# Patient Record
Sex: Female | Born: 1960
Health system: Southern US, Community
[De-identification: ages and names within clinical notes are randomized; demographics above are authoritative.]

## PROBLEM LIST (undated history)

## (undated) ENCOUNTER — Emergency Department (HOSPITAL_COMMUNITY): Admission: EM | Payer: 59 | Source: Home / Self Care

## (undated) DIAGNOSIS — E669 Obesity, unspecified: Secondary | ICD-10-CM

## (undated) DIAGNOSIS — G43909 Migraine, unspecified, not intractable, without status migrainosus: Secondary | ICD-10-CM

## (undated) DIAGNOSIS — M7730 Calcaneal spur, unspecified foot: Secondary | ICD-10-CM

## (undated) DIAGNOSIS — C50919 Malignant neoplasm of unspecified site of unspecified female breast: Secondary | ICD-10-CM

## (undated) DIAGNOSIS — E785 Hyperlipidemia, unspecified: Secondary | ICD-10-CM

## (undated) DIAGNOSIS — I10 Essential (primary) hypertension: Secondary | ICD-10-CM

## (undated) HISTORY — PX: TUBAL LIGATION: SHX77

## (undated) HISTORY — PX: OTHER SURGICAL HISTORY: SHX169

## (undated) HISTORY — PX: CHOLECYSTECTOMY: SHX55

## (undated) HISTORY — DX: Calcaneal spur, unspecified foot: M77.30

## (undated) HISTORY — DX: Obesity, unspecified: E66.9

## (undated) HISTORY — DX: Malignant neoplasm of unspecified site of unspecified female breast: C50.919

## (undated) HISTORY — DX: Essential (primary) hypertension: I10

## (undated) HISTORY — PX: BREAST SURGERY: SHX581

## (undated) HISTORY — DX: Migraine, unspecified, not intractable, without status migrainosus: G43.909

## (undated) HISTORY — DX: Hyperlipidemia, unspecified: E78.5

---

## 1990-09-25 HISTORY — PX: OTHER SURGICAL HISTORY: SHX169

## 1998-09-25 HISTORY — PX: OTHER SURGICAL HISTORY: SHX169

## 2003-05-08 ENCOUNTER — Encounter: Payer: Self-pay | Admitting: Family Medicine

## 2003-05-08 ENCOUNTER — Ambulatory Visit (HOSPITAL_COMMUNITY): Admission: RE | Admit: 2003-05-08 | Discharge: 2003-05-08 | Payer: Self-pay | Admitting: Family Medicine

## 2003-05-20 ENCOUNTER — Encounter: Payer: Self-pay | Admitting: Family Medicine

## 2003-05-20 ENCOUNTER — Ambulatory Visit (HOSPITAL_COMMUNITY): Admission: RE | Admit: 2003-05-20 | Discharge: 2003-05-20 | Payer: Self-pay | Admitting: Family Medicine

## 2004-09-13 ENCOUNTER — Ambulatory Visit: Payer: Self-pay | Admitting: Family Medicine

## 2004-09-20 ENCOUNTER — Ambulatory Visit (HOSPITAL_COMMUNITY): Admission: RE | Admit: 2004-09-20 | Discharge: 2004-09-20 | Payer: Self-pay | Admitting: Family Medicine

## 2004-10-05 ENCOUNTER — Ambulatory Visit (HOSPITAL_COMMUNITY): Admission: RE | Admit: 2004-10-05 | Discharge: 2004-10-05 | Payer: Self-pay | Admitting: Family Medicine

## 2004-10-18 ENCOUNTER — Inpatient Hospital Stay (HOSPITAL_COMMUNITY): Admission: RE | Admit: 2004-10-18 | Discharge: 2004-10-20 | Payer: Self-pay | Admitting: General Surgery

## 2004-11-11 ENCOUNTER — Emergency Department (HOSPITAL_COMMUNITY): Admission: EM | Admit: 2004-11-11 | Discharge: 2004-11-11 | Payer: Self-pay | Admitting: Emergency Medicine

## 2004-11-11 ENCOUNTER — Ambulatory Visit: Payer: Self-pay | Admitting: Family Medicine

## 2004-11-15 ENCOUNTER — Encounter: Admission: RE | Admit: 2004-11-15 | Discharge: 2004-11-15 | Payer: Self-pay | Admitting: Oncology

## 2004-11-15 ENCOUNTER — Ambulatory Visit (HOSPITAL_COMMUNITY): Payer: Self-pay | Admitting: Oncology

## 2004-11-15 ENCOUNTER — Encounter (HOSPITAL_COMMUNITY): Admission: RE | Admit: 2004-11-15 | Discharge: 2004-12-15 | Payer: Self-pay | Admitting: Oncology

## 2004-11-17 ENCOUNTER — Ambulatory Visit: Payer: Self-pay | Admitting: Cardiology

## 2004-11-18 ENCOUNTER — Ambulatory Visit (HOSPITAL_COMMUNITY): Admission: RE | Admit: 2004-11-18 | Discharge: 2004-11-18 | Payer: Self-pay | Admitting: Oncology

## 2004-11-25 ENCOUNTER — Ambulatory Visit (HOSPITAL_COMMUNITY): Admission: RE | Admit: 2004-11-25 | Discharge: 2004-11-25 | Payer: Self-pay | Admitting: General Surgery

## 2004-12-19 ENCOUNTER — Encounter (HOSPITAL_COMMUNITY): Admission: RE | Admit: 2004-12-19 | Discharge: 2005-01-18 | Payer: Self-pay | Admitting: Oncology

## 2004-12-19 ENCOUNTER — Encounter: Admission: RE | Admit: 2004-12-19 | Discharge: 2004-12-19 | Payer: Self-pay | Admitting: Oncology

## 2004-12-28 ENCOUNTER — Ambulatory Visit: Payer: Self-pay | Admitting: Family Medicine

## 2005-01-02 ENCOUNTER — Ambulatory Visit (HOSPITAL_COMMUNITY): Payer: Self-pay | Admitting: Oncology

## 2005-01-04 ENCOUNTER — Ambulatory Visit (HOSPITAL_COMMUNITY): Admission: RE | Admit: 2005-01-04 | Discharge: 2005-01-04 | Payer: Self-pay | Admitting: Family Medicine

## 2005-01-11 ENCOUNTER — Ambulatory Visit: Payer: Self-pay | Admitting: *Deleted

## 2005-01-13 ENCOUNTER — Encounter (HOSPITAL_COMMUNITY): Admission: RE | Admit: 2005-01-13 | Discharge: 2005-02-12 | Payer: Self-pay | Admitting: Oncology

## 2005-01-13 ENCOUNTER — Encounter: Admission: RE | Admit: 2005-01-13 | Discharge: 2005-01-13 | Payer: Self-pay | Admitting: Oncology

## 2005-02-13 ENCOUNTER — Encounter (HOSPITAL_COMMUNITY): Admission: RE | Admit: 2005-02-13 | Discharge: 2005-03-15 | Payer: Self-pay | Admitting: Oncology

## 2005-02-13 ENCOUNTER — Encounter: Admission: RE | Admit: 2005-02-13 | Discharge: 2005-02-13 | Payer: Self-pay | Admitting: Oncology

## 2005-02-20 ENCOUNTER — Ambulatory Visit (HOSPITAL_COMMUNITY): Payer: Self-pay | Admitting: Oncology

## 2005-03-20 ENCOUNTER — Encounter (HOSPITAL_COMMUNITY): Admission: RE | Admit: 2005-03-20 | Discharge: 2005-04-19 | Payer: Self-pay | Admitting: Oncology

## 2005-03-20 ENCOUNTER — Encounter: Admission: RE | Admit: 2005-03-20 | Discharge: 2005-03-20 | Payer: Self-pay | Admitting: Oncology

## 2005-04-10 ENCOUNTER — Ambulatory Visit (HOSPITAL_COMMUNITY): Payer: Self-pay | Admitting: Oncology

## 2005-04-24 ENCOUNTER — Encounter (HOSPITAL_COMMUNITY): Admission: RE | Admit: 2005-04-24 | Discharge: 2005-05-24 | Payer: Self-pay | Admitting: Oncology

## 2005-04-24 ENCOUNTER — Encounter: Admission: RE | Admit: 2005-04-24 | Discharge: 2005-04-24 | Payer: Self-pay | Admitting: Oncology

## 2005-05-24 ENCOUNTER — Ambulatory Visit (HOSPITAL_COMMUNITY): Admission: RE | Admit: 2005-05-24 | Discharge: 2005-05-24 | Payer: Self-pay | Admitting: Radiation Oncology

## 2005-05-25 ENCOUNTER — Ambulatory Visit: Admission: RE | Admit: 2005-05-25 | Discharge: 2005-08-14 | Payer: Self-pay | Admitting: Radiation Oncology

## 2005-05-30 ENCOUNTER — Ambulatory Visit (HOSPITAL_COMMUNITY): Payer: Self-pay | Admitting: Oncology

## 2005-05-30 ENCOUNTER — Encounter: Admission: RE | Admit: 2005-05-30 | Discharge: 2005-06-23 | Payer: Self-pay | Admitting: Oncology

## 2005-05-30 ENCOUNTER — Encounter (HOSPITAL_COMMUNITY): Admission: RE | Admit: 2005-05-30 | Discharge: 2005-06-23 | Payer: Self-pay | Admitting: Oncology

## 2005-06-14 ENCOUNTER — Ambulatory Visit: Payer: Self-pay | Admitting: Family Medicine

## 2005-06-23 ENCOUNTER — Ambulatory Visit: Payer: Self-pay | Admitting: Family Medicine

## 2005-06-23 ENCOUNTER — Inpatient Hospital Stay (HOSPITAL_COMMUNITY): Admission: AD | Admit: 2005-06-23 | Discharge: 2005-06-25 | Payer: Self-pay | Admitting: Internal Medicine

## 2005-06-26 ENCOUNTER — Encounter: Admission: RE | Admit: 2005-06-26 | Discharge: 2005-06-26 | Payer: Self-pay | Admitting: Oncology

## 2005-06-26 ENCOUNTER — Encounter (HOSPITAL_COMMUNITY): Admission: RE | Admit: 2005-06-26 | Discharge: 2005-07-26 | Payer: Self-pay | Admitting: Oncology

## 2005-06-28 ENCOUNTER — Ambulatory Visit (HOSPITAL_COMMUNITY): Admission: RE | Admit: 2005-06-28 | Discharge: 2005-06-28 | Payer: Self-pay | Admitting: Internal Medicine

## 2005-06-29 ENCOUNTER — Ambulatory Visit: Payer: Self-pay | Admitting: *Deleted

## 2005-06-29 ENCOUNTER — Ambulatory Visit (HOSPITAL_COMMUNITY): Admission: RE | Admit: 2005-06-29 | Discharge: 2005-06-29 | Payer: Self-pay | Admitting: Family Medicine

## 2005-06-29 ENCOUNTER — Ambulatory Visit: Payer: Self-pay | Admitting: Family Medicine

## 2005-07-31 ENCOUNTER — Encounter: Admission: RE | Admit: 2005-07-31 | Discharge: 2005-07-31 | Payer: Self-pay | Admitting: Oncology

## 2005-07-31 ENCOUNTER — Ambulatory Visit (HOSPITAL_COMMUNITY): Payer: Self-pay | Admitting: Oncology

## 2005-09-11 ENCOUNTER — Encounter: Admission: RE | Admit: 2005-09-11 | Discharge: 2005-09-11 | Payer: Self-pay | Admitting: Oncology

## 2005-09-13 ENCOUNTER — Ambulatory Visit: Payer: Self-pay | Admitting: Family Medicine

## 2005-09-27 ENCOUNTER — Encounter (HOSPITAL_COMMUNITY): Admission: RE | Admit: 2005-09-27 | Discharge: 2005-10-27 | Payer: Self-pay | Admitting: Oncology

## 2005-09-27 ENCOUNTER — Ambulatory Visit (HOSPITAL_COMMUNITY): Admission: RE | Admit: 2005-09-27 | Discharge: 2005-09-27 | Payer: Self-pay | Admitting: Family Medicine

## 2005-09-27 ENCOUNTER — Encounter: Admission: RE | Admit: 2005-09-27 | Discharge: 2005-09-27 | Payer: Self-pay | Admitting: Oncology

## 2005-09-27 ENCOUNTER — Ambulatory Visit: Payer: Self-pay | Admitting: *Deleted

## 2005-10-02 ENCOUNTER — Ambulatory Visit (HOSPITAL_COMMUNITY): Payer: Self-pay | Admitting: Oncology

## 2005-11-13 ENCOUNTER — Encounter: Admission: RE | Admit: 2005-11-13 | Discharge: 2005-11-13 | Payer: Self-pay | Admitting: Oncology

## 2005-11-13 ENCOUNTER — Encounter (HOSPITAL_COMMUNITY): Admission: RE | Admit: 2005-11-13 | Discharge: 2005-12-13 | Payer: Self-pay | Admitting: Oncology

## 2005-11-20 ENCOUNTER — Ambulatory Visit (HOSPITAL_COMMUNITY): Payer: Self-pay | Admitting: Oncology

## 2005-12-25 ENCOUNTER — Encounter (HOSPITAL_COMMUNITY): Admission: RE | Admit: 2005-12-25 | Discharge: 2006-01-24 | Payer: Self-pay | Admitting: Oncology

## 2005-12-25 ENCOUNTER — Encounter: Admission: RE | Admit: 2005-12-25 | Discharge: 2005-12-25 | Payer: Self-pay

## 2005-12-28 ENCOUNTER — Encounter (INDEPENDENT_AMBULATORY_CARE_PROVIDER_SITE_OTHER): Payer: Self-pay | Admitting: *Deleted

## 2005-12-28 ENCOUNTER — Ambulatory Visit: Payer: Self-pay | Admitting: Family Medicine

## 2005-12-28 ENCOUNTER — Other Ambulatory Visit: Admission: RE | Admit: 2005-12-28 | Discharge: 2005-12-28 | Payer: Self-pay | Admitting: Family Medicine

## 2005-12-28 ENCOUNTER — Encounter: Payer: Self-pay | Admitting: Family Medicine

## 2005-12-28 LAB — CONVERTED CEMR LAB: Pap Smear: NORMAL

## 2006-01-08 ENCOUNTER — Ambulatory Visit (HOSPITAL_COMMUNITY): Admission: RE | Admit: 2006-01-08 | Discharge: 2006-01-08 | Payer: Self-pay | Admitting: Family Medicine

## 2006-01-15 ENCOUNTER — Ambulatory Visit (HOSPITAL_COMMUNITY): Payer: Self-pay | Admitting: Oncology

## 2006-02-26 ENCOUNTER — Encounter: Admission: RE | Admit: 2006-02-26 | Discharge: 2006-02-26 | Payer: Self-pay | Admitting: Oncology

## 2006-02-26 ENCOUNTER — Encounter (HOSPITAL_COMMUNITY): Admission: RE | Admit: 2006-02-26 | Discharge: 2006-03-28 | Payer: Self-pay | Admitting: Oncology

## 2006-03-01 ENCOUNTER — Encounter (INDEPENDENT_AMBULATORY_CARE_PROVIDER_SITE_OTHER): Payer: Self-pay | Admitting: *Deleted

## 2006-03-01 LAB — CONVERTED CEMR LAB: Pap Smear: NORMAL

## 2006-03-16 ENCOUNTER — Ambulatory Visit (HOSPITAL_COMMUNITY): Admission: RE | Admit: 2006-03-16 | Discharge: 2006-03-16 | Payer: Self-pay | Admitting: General Surgery

## 2006-05-21 ENCOUNTER — Ambulatory Visit (HOSPITAL_COMMUNITY): Payer: Self-pay | Admitting: Oncology

## 2006-10-30 ENCOUNTER — Ambulatory Visit: Payer: Self-pay | Admitting: Cardiology

## 2006-10-30 ENCOUNTER — Encounter (HOSPITAL_COMMUNITY): Admission: RE | Admit: 2006-10-30 | Discharge: 2006-11-29 | Payer: Self-pay | Admitting: Oncology

## 2006-11-21 ENCOUNTER — Ambulatory Visit (HOSPITAL_COMMUNITY): Payer: Self-pay | Admitting: Oncology

## 2006-11-22 ENCOUNTER — Ambulatory Visit: Payer: Self-pay | Admitting: Family Medicine

## 2006-11-29 ENCOUNTER — Ambulatory Visit (HOSPITAL_COMMUNITY): Admission: RE | Admit: 2006-11-29 | Discharge: 2006-11-29 | Payer: Self-pay | Admitting: Family Medicine

## 2006-12-31 ENCOUNTER — Other Ambulatory Visit: Admission: RE | Admit: 2006-12-31 | Discharge: 2006-12-31 | Payer: Self-pay | Admitting: Family Medicine

## 2006-12-31 ENCOUNTER — Encounter: Payer: Self-pay | Admitting: Family Medicine

## 2006-12-31 ENCOUNTER — Encounter (INDEPENDENT_AMBULATORY_CARE_PROVIDER_SITE_OTHER): Payer: Self-pay | Admitting: *Deleted

## 2006-12-31 ENCOUNTER — Ambulatory Visit: Payer: Self-pay | Admitting: Family Medicine

## 2006-12-31 LAB — CONVERTED CEMR LAB: Pap Smear: NORMAL

## 2007-01-16 ENCOUNTER — Ambulatory Visit (HOSPITAL_COMMUNITY): Admission: RE | Admit: 2007-01-16 | Discharge: 2007-01-16 | Payer: Self-pay | Admitting: Family Medicine

## 2007-04-15 ENCOUNTER — Ambulatory Visit: Payer: Self-pay | Admitting: Family Medicine

## 2007-06-10 ENCOUNTER — Ambulatory Visit (HOSPITAL_COMMUNITY): Payer: Self-pay | Admitting: Oncology

## 2007-06-10 ENCOUNTER — Encounter (HOSPITAL_COMMUNITY): Admission: RE | Admit: 2007-06-10 | Discharge: 2007-06-25 | Payer: Self-pay | Admitting: Oncology

## 2007-07-01 ENCOUNTER — Ambulatory Visit: Payer: Self-pay | Admitting: Family Medicine

## 2007-07-08 ENCOUNTER — Encounter: Payer: Self-pay | Admitting: Family Medicine

## 2007-07-08 LAB — CONVERTED CEMR LAB
BUN: 9 mg/dL (ref 6–23)
Chloride: 104 meq/L (ref 96–112)
Glucose, Bld: 77 mg/dL (ref 70–99)
LDL Cholesterol: 138 mg/dL — ABNORMAL HIGH (ref 0–99)
Sodium: 141 meq/L (ref 135–145)

## 2007-11-18 ENCOUNTER — Ambulatory Visit: Payer: Self-pay | Admitting: Family Medicine

## 2007-12-31 ENCOUNTER — Ambulatory Visit (HOSPITAL_COMMUNITY): Payer: Self-pay | Admitting: Oncology

## 2008-01-17 ENCOUNTER — Encounter (INDEPENDENT_AMBULATORY_CARE_PROVIDER_SITE_OTHER): Payer: Self-pay | Admitting: *Deleted

## 2008-01-17 DIAGNOSIS — E785 Hyperlipidemia, unspecified: Secondary | ICD-10-CM | POA: Insufficient documentation

## 2008-01-17 DIAGNOSIS — M7730 Calcaneal spur, unspecified foot: Secondary | ICD-10-CM | POA: Insufficient documentation

## 2008-01-17 DIAGNOSIS — C50919 Malignant neoplasm of unspecified site of unspecified female breast: Secondary | ICD-10-CM | POA: Insufficient documentation

## 2008-01-17 DIAGNOSIS — I10 Essential (primary) hypertension: Secondary | ICD-10-CM | POA: Insufficient documentation

## 2008-01-20 ENCOUNTER — Ambulatory Visit (HOSPITAL_COMMUNITY): Admission: RE | Admit: 2008-01-20 | Discharge: 2008-01-20 | Payer: Self-pay | Admitting: Family Medicine

## 2008-01-20 ENCOUNTER — Ambulatory Visit: Payer: Self-pay | Admitting: Family Medicine

## 2008-01-20 ENCOUNTER — Encounter: Payer: Self-pay | Admitting: Family Medicine

## 2008-01-20 LAB — CONVERTED CEMR LAB
Basophils Absolute: 0.1 10*3/uL (ref 0.0–0.1)
Cholesterol: 183 mg/dL (ref 0–200)
Eosinophils Absolute: 0.3 10*3/uL (ref 0.0–0.7)
Hemoglobin: 14 g/dL (ref 12.0–15.0)
Lymphocytes Relative: 39 % (ref 12–46)
Lymphs Abs: 2.6 10*3/uL (ref 0.7–4.0)
MCHC: 32.3 g/dL (ref 30.0–36.0)
Monocytes Relative: 4 % (ref 3–12)
Neutro Abs: 3.4 10*3/uL (ref 1.7–7.7)
Neutrophils Relative %: 52 % (ref 43–77)
Platelets: 432 10*3/uL — ABNORMAL HIGH (ref 150–400)
RBC: 4.47 M/uL (ref 3.87–5.11)
RDW: 14 % (ref 11.5–15.5)
Sodium: 145 meq/L (ref 135–145)
WBC: 6.5 10*3/uL (ref 4.0–10.5)

## 2008-01-21 DIAGNOSIS — J309 Allergic rhinitis, unspecified: Secondary | ICD-10-CM | POA: Insufficient documentation

## 2008-03-06 ENCOUNTER — Ambulatory Visit (HOSPITAL_COMMUNITY): Admission: RE | Admit: 2008-03-06 | Discharge: 2008-03-06 | Payer: Self-pay | Admitting: Surgery

## 2008-03-30 ENCOUNTER — Encounter: Payer: Self-pay | Admitting: Family Medicine

## 2008-03-30 ENCOUNTER — Ambulatory Visit: Payer: Self-pay | Admitting: Family Medicine

## 2008-03-30 ENCOUNTER — Other Ambulatory Visit: Admission: RE | Admit: 2008-03-30 | Discharge: 2008-03-30 | Payer: Self-pay | Admitting: Family Medicine

## 2008-04-03 ENCOUNTER — Encounter: Payer: Self-pay | Admitting: Family Medicine

## 2008-06-08 ENCOUNTER — Ambulatory Visit: Payer: Self-pay | Admitting: Family Medicine

## 2008-06-12 ENCOUNTER — Encounter: Payer: Self-pay | Admitting: Family Medicine

## 2008-07-06 ENCOUNTER — Ambulatory Visit (HOSPITAL_COMMUNITY): Payer: Self-pay | Admitting: Oncology

## 2008-07-06 ENCOUNTER — Encounter (HOSPITAL_COMMUNITY): Admission: RE | Admit: 2008-07-06 | Discharge: 2008-08-05 | Payer: Self-pay | Admitting: Oncology

## 2008-07-13 ENCOUNTER — Telehealth: Payer: Self-pay | Admitting: Family Medicine

## 2008-07-14 ENCOUNTER — Encounter: Payer: Self-pay | Admitting: Family Medicine

## 2008-07-20 ENCOUNTER — Ambulatory Visit: Payer: Self-pay | Admitting: Family Medicine

## 2008-07-21 ENCOUNTER — Encounter: Payer: Self-pay | Admitting: Family Medicine

## 2008-09-07 ENCOUNTER — Ambulatory Visit: Payer: Self-pay | Admitting: Family Medicine

## 2008-09-09 ENCOUNTER — Telehealth: Payer: Self-pay | Admitting: Family Medicine

## 2008-09-10 ENCOUNTER — Encounter: Payer: Self-pay | Admitting: Family Medicine

## 2008-09-10 LAB — CONVERTED CEMR LAB: TSH: 1.765 microintl units/mL (ref 0.350–4.50)

## 2008-09-22 ENCOUNTER — Telehealth: Payer: Self-pay | Admitting: Family Medicine

## 2008-10-05 ENCOUNTER — Encounter (HOSPITAL_COMMUNITY): Admission: RE | Admit: 2008-10-05 | Discharge: 2008-11-04 | Payer: Self-pay | Admitting: Oncology

## 2008-10-05 ENCOUNTER — Ambulatory Visit (HOSPITAL_COMMUNITY): Payer: Self-pay | Admitting: Oncology

## 2008-10-29 ENCOUNTER — Telehealth: Payer: Self-pay | Admitting: Family Medicine

## 2008-11-06 ENCOUNTER — Ambulatory Visit: Payer: Self-pay | Admitting: Family Medicine

## 2008-11-06 DIAGNOSIS — R5381 Other malaise: Secondary | ICD-10-CM | POA: Insufficient documentation

## 2008-11-06 DIAGNOSIS — R5383 Other fatigue: Secondary | ICD-10-CM

## 2008-12-14 ENCOUNTER — Ambulatory Visit: Payer: Self-pay | Admitting: Family Medicine

## 2008-12-24 ENCOUNTER — Encounter: Payer: Self-pay | Admitting: Family Medicine

## 2009-01-11 ENCOUNTER — Ambulatory Visit: Payer: Self-pay | Admitting: Family Medicine

## 2009-01-18 ENCOUNTER — Encounter: Payer: Self-pay | Admitting: Family Medicine

## 2009-01-18 ENCOUNTER — Ambulatory Visit (HOSPITAL_COMMUNITY): Payer: Self-pay | Admitting: Oncology

## 2009-01-21 ENCOUNTER — Ambulatory Visit (HOSPITAL_COMMUNITY): Admission: RE | Admit: 2009-01-21 | Discharge: 2009-01-21 | Payer: Self-pay | Admitting: Family Medicine

## 2009-01-25 ENCOUNTER — Encounter: Payer: Self-pay | Admitting: Family Medicine

## 2009-01-26 LAB — CONVERTED CEMR LAB
BUN: 14 mg/dL (ref 6–23)
Chloride: 103 meq/L (ref 96–112)
Glucose, Bld: 79 mg/dL (ref 70–99)
Hemoglobin: 13.4 g/dL (ref 12.0–15.0)
LDL Cholesterol: 106 mg/dL — ABNORMAL HIGH (ref 0–99)
MCHC: 33.2 g/dL (ref 30.0–36.0)
MCV: 95.5 fL (ref 78.0–100.0)
RDW: 13.3 % (ref 11.5–15.5)
Sodium: 139 meq/L (ref 135–145)
Triglycerides: 85 mg/dL (ref <150)

## 2009-02-09 ENCOUNTER — Telehealth: Payer: Self-pay | Admitting: Family Medicine

## 2009-02-25 ENCOUNTER — Encounter: Payer: Self-pay | Admitting: Family Medicine

## 2009-02-26 ENCOUNTER — Ambulatory Visit: Payer: Self-pay | Admitting: Family Medicine

## 2009-02-26 DIAGNOSIS — M7660 Achilles tendinitis, unspecified leg: Secondary | ICD-10-CM | POA: Insufficient documentation

## 2009-03-05 ENCOUNTER — Encounter: Payer: Self-pay | Admitting: Family Medicine

## 2009-04-01 ENCOUNTER — Encounter (INDEPENDENT_AMBULATORY_CARE_PROVIDER_SITE_OTHER): Payer: Self-pay | Admitting: Obstetrics and Gynecology

## 2009-04-01 ENCOUNTER — Ambulatory Visit (HOSPITAL_COMMUNITY): Admission: RE | Admit: 2009-04-01 | Discharge: 2009-04-01 | Payer: Self-pay | Admitting: Obstetrics and Gynecology

## 2009-04-19 ENCOUNTER — Ambulatory Visit: Payer: Self-pay | Admitting: Family Medicine

## 2009-04-19 ENCOUNTER — Encounter: Payer: Self-pay | Admitting: Family Medicine

## 2009-04-19 ENCOUNTER — Other Ambulatory Visit: Admission: RE | Admit: 2009-04-19 | Discharge: 2009-04-19 | Payer: Self-pay | Admitting: Family Medicine

## 2009-04-19 LAB — CONVERTED CEMR LAB: OCCULT 1: NEGATIVE

## 2009-04-21 ENCOUNTER — Encounter: Payer: Self-pay | Admitting: Family Medicine

## 2009-04-29 ENCOUNTER — Telehealth: Payer: Self-pay | Admitting: Family Medicine

## 2009-06-01 ENCOUNTER — Telehealth: Payer: Self-pay | Admitting: Family Medicine

## 2009-06-02 ENCOUNTER — Ambulatory Visit: Payer: Self-pay | Admitting: Family Medicine

## 2009-06-02 LAB — CONVERTED CEMR LAB
Glucose, Urine, Semiquant: NEGATIVE
Protein, U semiquant: 100
Specific Gravity, Urine: 1.02
Urobilinogen, UA: 0.2
pH: 6

## 2009-06-03 ENCOUNTER — Ambulatory Visit: Payer: Self-pay | Admitting: Family Medicine

## 2009-06-04 ENCOUNTER — Encounter: Payer: Self-pay | Admitting: Family Medicine

## 2009-07-05 ENCOUNTER — Ambulatory Visit: Payer: Self-pay | Admitting: Family Medicine

## 2009-07-07 ENCOUNTER — Telehealth: Payer: Self-pay | Admitting: Family Medicine

## 2009-07-13 ENCOUNTER — Ambulatory Visit: Payer: Self-pay | Admitting: Family Medicine

## 2009-07-13 ENCOUNTER — Telehealth: Payer: Self-pay | Admitting: Family Medicine

## 2009-07-16 ENCOUNTER — Telehealth: Payer: Self-pay | Admitting: Family Medicine

## 2009-07-19 ENCOUNTER — Ambulatory Visit (HOSPITAL_COMMUNITY): Payer: Self-pay | Admitting: Oncology

## 2009-07-19 ENCOUNTER — Encounter: Payer: Self-pay | Admitting: Family Medicine

## 2009-07-27 ENCOUNTER — Ambulatory Visit: Payer: Self-pay | Admitting: Family Medicine

## 2009-08-16 ENCOUNTER — Ambulatory Visit: Payer: Self-pay | Admitting: Family Medicine

## 2009-11-15 ENCOUNTER — Ambulatory Visit: Payer: Self-pay | Admitting: Family Medicine

## 2009-11-16 ENCOUNTER — Telehealth: Payer: Self-pay | Admitting: Family Medicine

## 2009-11-16 LAB — CONVERTED CEMR LAB
BUN: 14 mg/dL (ref 6–23)
Calcium: 9.9 mg/dL (ref 8.4–10.5)
Chloride: 104 meq/L (ref 96–112)
Cholesterol: 201 mg/dL — ABNORMAL HIGH (ref 0–200)
Creatinine, Ser: 0.62 mg/dL (ref 0.40–1.20)
Potassium: 4.1 meq/L (ref 3.5–5.3)
Sodium: 142 meq/L (ref 135–145)
Total CHOL/HDL Ratio: 3.4

## 2009-12-16 ENCOUNTER — Telehealth: Payer: Self-pay | Admitting: Family Medicine

## 2010-01-03 ENCOUNTER — Ambulatory Visit: Payer: Self-pay | Admitting: Family Medicine

## 2010-01-26 ENCOUNTER — Ambulatory Visit (HOSPITAL_COMMUNITY): Admission: RE | Admit: 2010-01-26 | Discharge: 2010-01-26 | Payer: Self-pay | Admitting: Family Medicine

## 2010-01-31 ENCOUNTER — Ambulatory Visit (HOSPITAL_COMMUNITY): Payer: Self-pay | Admitting: Internal Medicine

## 2010-01-31 ENCOUNTER — Encounter: Payer: Self-pay | Admitting: Family Medicine

## 2010-03-17 ENCOUNTER — Telehealth: Payer: Self-pay | Admitting: Family Medicine

## 2010-03-17 ENCOUNTER — Encounter: Payer: Self-pay | Admitting: Family Medicine

## 2010-03-18 ENCOUNTER — Ambulatory Visit: Payer: Self-pay | Admitting: Family Medicine

## 2010-04-04 ENCOUNTER — Ambulatory Visit: Payer: Self-pay | Admitting: Family Medicine

## 2010-04-04 LAB — CONVERTED CEMR LAB: OCCULT 1: NEGATIVE

## 2010-04-11 ENCOUNTER — Telehealth: Payer: Self-pay | Admitting: Family Medicine

## 2010-06-02 ENCOUNTER — Telehealth: Payer: Self-pay | Admitting: Family Medicine

## 2010-07-11 ENCOUNTER — Other Ambulatory Visit: Admission: RE | Admit: 2010-07-11 | Discharge: 2010-07-11 | Payer: Self-pay | Admitting: Family Medicine

## 2010-07-11 ENCOUNTER — Ambulatory Visit (HOSPITAL_COMMUNITY): Admission: RE | Admit: 2010-07-11 | Discharge: 2010-07-11 | Payer: Self-pay | Admitting: Family Medicine

## 2010-07-11 ENCOUNTER — Ambulatory Visit: Payer: Self-pay | Admitting: Family Medicine

## 2010-07-11 DIAGNOSIS — R05 Cough: Secondary | ICD-10-CM

## 2010-07-11 DIAGNOSIS — R059 Cough, unspecified: Secondary | ICD-10-CM | POA: Insufficient documentation

## 2010-07-11 LAB — HM PAP SMEAR

## 2010-07-15 ENCOUNTER — Encounter: Payer: Self-pay | Admitting: Family Medicine

## 2010-07-15 ENCOUNTER — Telehealth (INDEPENDENT_AMBULATORY_CARE_PROVIDER_SITE_OTHER): Payer: Self-pay | Admitting: *Deleted

## 2010-08-16 ENCOUNTER — Telehealth: Payer: Self-pay | Admitting: Family Medicine

## 2010-08-22 ENCOUNTER — Ambulatory Visit: Payer: Self-pay | Admitting: Family Medicine

## 2010-08-22 DIAGNOSIS — N644 Mastodynia: Secondary | ICD-10-CM | POA: Insufficient documentation

## 2010-08-31 ENCOUNTER — Ambulatory Visit (HOSPITAL_COMMUNITY)
Admission: RE | Admit: 2010-08-31 | Discharge: 2010-08-31 | Payer: Self-pay | Source: Home / Self Care | Attending: Family Medicine | Admitting: Family Medicine

## 2010-09-21 ENCOUNTER — Telehealth: Payer: Self-pay | Admitting: Family Medicine

## 2010-10-15 ENCOUNTER — Encounter (HOSPITAL_COMMUNITY): Payer: Self-pay | Admitting: Oncology

## 2010-10-16 ENCOUNTER — Encounter: Payer: Self-pay | Admitting: Surgery

## 2010-10-25 NOTE — Assessment & Plan Note (Signed)
  this was just an rx sent in, no oV

## 2010-10-25 NOTE — Assessment & Plan Note (Signed)
Summary: office visit   Vital Signs:  Patient profile:   50 year old female Menstrual status:  postmenopausal Height:      61.5 inches Weight:      174.75 pounds BMI:     32.60 O2 Sat:      98 % on Room air Pulse rate:   96 / minute Pulse rhythm:   regular Resp:     16 per minute BP sitting:   116 / 80  (left arm)  Vitals Entered By: Adella Hare LPN (July 11, 2010 2:28 PM)  Nutrition Counseling: Patient's BMI is greater than 25 and therefore counseled on weight management options.  O2 Flow:  Room air CC: follow-up visit Is Patient Diabetic? No Pain Assessment Patient in pain? no        Primary Care Provider:  Syliva Overman MD  CC:  follow-up visit.  History of Present Illness: Reports  thatshe has been doing well. Pt has been modifying her behavior, and along with the help of phentermine, is managing to lose weight.she expects a physical today , and will have her pap and rectal done.Her mamo is UTD. Denies recent fever or chills. Denies sinus pressure, nasal congestion , ear pain or sore throat. Denies chest congestion, or cough productive of sputum.She does have a dry cough, like a tickle in her throat. Denies chest pain, palpitations, PND, orthopnea or leg swelling. Denies abdominal pain, nausea, vomitting, diarrhea or constipation. Denies change in bowel movements or bloody stool. Denies dysuria , frequency, incontinence or hesitancy. Denies  joint pain, swelling, or reduced mobility. Denies headaches, vertigo, seizures. Denies depression, anxiety or insomnia. Denies  rash, lesions, or itch.     Current Medications (verified): 1)  Klor-Con 20 Meq Pack (Potassium Chloride) .... Take 1 Tablet By Mouth Once A Day 2)  Ibuprofen 800 Mg Tabs (Ibuprofen) .... Take 1 Tablet By Mouth Three Times A Day As Needed 3)  Biotin 1000 Mcg Tabs (Biotin) .... Take 1 Tablet By Mouth Once A Day 4)  Lotensin 10 Mg Tabs (Benazepril Hcl) .... Take 1 Tablet By Mouth Once A  Day 5)  Phentermine Hcl 37.5 Mg Tabs (Phentermine Hcl) .... Take 1 Tablet By Mouth Once A Day 6)  Hydrochlorothiazide 25 Mg Tabs (Hydrochlorothiazide) .... Take 1 Tablet By Mouth Once A Day 7)  Vitamin D 1000 Unit Tabs (Cholecalciferol) .... One Tab By Mouth Once Daily 8)  Cetirizine Hcl 10 Mg Tabs (Cetirizine Hcl) .... One Tab By Mouth Once Daily  Allergies (verified): 1)  Ace Inhibitors  Review of Systems      See HPI General:  Complains of loss of appetite; denies fatigue. Eyes:  Denies blurring, discharge, double vision, eye pain, and red eye. Resp:  Complains of cough; denies shortness of breath and sputum productive. Endo:  Denies excessive thirst, excessive urination, and polyuria. Heme:  Denies abnormal bruising and bleeding. Allergy:  Complains of seasonal allergies.  Physical Exam  General:  Well-developed,well-nourished,in no acute distress; alert,appropriate and cooperative throughout examination HEENT: No facial asymmetry,  EOMI, No sinus tenderness, TM's Clear, oropharynx  pink and moist.   Chest: Clear to auscultation bilaterally.  CVS: S1, S2, No murmurs, No S3.   Abd: Soft, Nontender.  MS: Adequate ROM spine, hips, shoulders and knees.  Ext: No edema.   CNS: CN 2-12 intact, power tone and sensation normal throughout.   Skin: Intact, no visible lesions or rashes.  Psych: Good eye contact, normal affect.  Memory intact, not anxious or depressed  appearing.  Rectal:  No external abnormalities noted. Normal sphincter tone. No rectal masses or tenderness. Guaic negative stool. Genitalia:  Normal introitus for age, no external lesions, no vaginal discharge, mucosa pink and moist, no vaginal or cervical lesions, no vaginal atrophy, no friaility or hemorrhage, normal uterus size and position, no adnexal masses or tenderness   Impression & Recommendations:  Problem # 1:  COUGH (ICD-786.2) Assessment Comment Only  Orders: CXR- 2view (CXR)  Problem # 2:  SPECIAL  SCREENING FOR MALIGNANT NEOPLASMS COLON (ICD-V76.51) Assessment: Comment Only guaic negative stool  Problem # 3:  SCREENING FOR MALIGNANT NEOPLASM OF THE CERVIX (ICD-V76.2) Assessment: Comment Only  Problem # 4:  HYPERLIPIDEMIA (ICD-272.4) Assessment: Comment Only Low fat dietdiscussed and encouraged  Orders: T-Lipid Profile ([id number])    HDL:60 (11/15/2009), 52 (01/25/2009)  LDL:124 (11/15/2009), 106 (01/25/2009)  Chol:201 (11/15/2009), 175 (01/25/2009)  Trig:84 (11/15/2009), 85 (01/25/2009)  Problem # 5:  OBESITY (ICD-278.00) Assessment: Improved  Ht: 61.5 (07/11/2010)   Wt: 174.75 (07/11/2010)   BMI: 32.60 (07/11/2010) therapeutic lifestyle change discussed and encouraged  Problem # 6:  HYPERTENSION (ICD-401.9) Assessment: Unchanged  The following medications were removed from the medication list:    Lotensin 10 Mg Tabs (Benazepril hcl) .Marland Kitchen... Take 1 tablet by mouth once a day Her updated medication list for this problem includes:    Hydrochlorothiazide 25 Mg Tabs (Hydrochlorothiazide) .Marland Kitchen... Take 1 tablet by mouth once a day  Orders: T-Basic Metabolic Panel ([id number])  BP today: 116/80 Prior BP: 110/80 (04/04/2010)  Labs Reviewed: K+: 4.1 (11/15/2009) Creat: : 0.62 (11/15/2009)   Chol: 201 (11/15/2009)   HDL: 60 (11/15/2009)   LDL: 124 (11/15/2009)   TG: 84 (11/15/2009)  Complete Medication List: 1)  Klor-con 20 Meq Pack (Potassium chloride) .... Take 1 tablet by mouth once a day 2)  Biotin 1000 Mcg Tabs (Biotin) .... Take 1 tablet by mouth once a day 3)  Phentermine Hcl 37.5 Mg Tabs (Phentermine hcl) .... Take 1 tablet by mouth once a day 4)  Hydrochlorothiazide 25 Mg Tabs (Hydrochlorothiazide) .... Take 1 tablet by mouth once a day 5)  Vitamin D 1000 Unit Tabs (Cholecalciferol) .... One tab by mouth once daily 6)  Cetirizine Hcl 10 Mg Tabs (Cetirizine hcl) .... One tab by mouth once daily 7)  Ibuprofen 800 Mg Tabs (Ibuprofen) .... Take 1 tablet by mouth three  times a day as needed for pain  Other Orders: T-CBC w/Diff ([id number]) T-TSH ([id number]) Pap Smear (47425)  Patient Instructions: 1)  Please schedule a follow-up appointment in2 to 3 months. 2)  We will send a pap and do a rectal today. 3)  It is important that you exercise regularly at least 20 minutes 5 times a week. If you develop chest pain, have severe difficulty breathing, or feel very tired , stop exercising immediately and seek medical attention. 4)  You need to lose weight. Consider a lower calorie diet and regular exercise.  5)  BMP prior to visit, ICD-9: 6)  Lipid Panel prior to visit, ICD-9: 7)  TSH prior to visit, ICD-9:   fasting asap 8)  CBC w/ Diff prior to visit, ICD-9: 9)  Stop benazepril, I think this is the cause of the cough 10)  CXR Prescriptions: KLOR-CON 20 MEQ PACK (POTASSIUM CHLORIDE) Take 1 tablet by mouth once a day  #90 x 0   Entered by:   Adella Hare LPN   Authorized by:   Syliva Overman MD   Signed by:   Adella Hare  LPN on 81/19/1478   Method used:   Electronically to        [facility]* (retail)       [address]       [address] Shipping/mailing       [place], [place]  [address]       Ph: [phone]       Fax: [phone]   RxID:   [id number] PHENTERMINE HCL 37.5 MG TABS (PHENTERMINE HCL) Take 1 tablet by mouth once a day  #30 x 1   Entered by:   Adella Hare LPN   Authorized by:   Syliva Overman MD   Signed by:   Adella Hare LPN on 34/74/2595   Method used:   Printed then faxed to ...       [facility]* (retail)       [address]       [address] Shipping/mailing       [place], [place]  [address]       Ph: [phone]       Fax: [phone]   RxID:   [id number]    Orders Added: 1)  Est. Patient Level IV [42706] 2)  CXR- 2view [CXR] 3)  T-Basic Metabolic Panel [[id number]] 4)  T-Lipid Profile [80061-22930] 5)  T-CBC w/Diff [[id number]] 6)  T-TSH  [[id number]] 7)  Pap Smear [62694]

## 2010-10-25 NOTE — Progress Notes (Signed)
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  She wants to know if she can just use those results for the blood work Dr. Lodema Hong has ordered.  Please advise patient on her cell. [phone] Thanks Initial call taken by: Curtis Sites,  July 15, 2010 4:46 PM  Follow-up for Phone Call        tell her let us wait and see what labs were drawn before she does any more labs pls. Also let her know the CXR was nl, liungs are clear Follow-up by: Syliva Overman MD,  July 17, 2010 5:32 AM  Additional Follow-up for Phone Call Additional follow up Details #1::        Called, left message Additional Follow-up by: Mauricia Area CMA,  July 18, 2010 2:00 PM    Additional Follow-up for Phone Call Additional follow up Details #2::    patient aware Follow-up by: Adella Hare LPN,  July 19, 2010 8:59 AM

## 2010-10-25 NOTE — Progress Notes (Signed)
Summary: meds  Phone Note Call from Patient   Summary of Call: pt says phermine suppose to be called into pharm at hospital. [phone] cell [phone] Initial call taken by: Rudene Anda,  November 16, 2009 3:54 PM  Follow-up for Phone Call        Phone Call Completed Follow-up by: Adella Hare LPN,  November 16, 2009 4:05 PM

## 2010-10-25 NOTE — Progress Notes (Signed)
Summary: MEDICINE  Phone Note Call from Patient   Summary of Call: NEEDS HER MEDICINE SWITCHED FROM [facility] TO [facility]. TODAY BECAUSE SHE HAS 1 MORE BP PILL PLEASE SEND [facility]. TODAY TO [facility].  CALL LET HER KNOW WHEN DONE  [phone] Initial call taken by: Lind Guest,  December 16, 2009 11:43 AM  Follow-up for Phone Call        need to know if she wants 30 or 90 day scripts  called patient, left message Follow-up by: Adella Hare LPN,  December 16, 2009 1:12 PM  Additional Follow-up for Phone Call Additional follow up Details #1::        rx sent and patient aware Additional Follow-up by: Adella Hare LPN,  December 16, 2009 1:17 PM    Prescriptions: LOTENSIN 10 MG TABS (BENAZEPRIL HCL) Take 1 tablet by mouth once a day  #90 x 0   Entered by:   Adella Hare LPN   Authorized by:   Syliva Overman MD   Signed by:   Adella Hare LPN on 32/95/1884   Method used:   Electronically to        [facility]* (retail)       [address]       [address] Shipping/mailing       [place], [place]  [address]       Ph: [phone]       Fax: [phone]   RxID:   [id number] HYDROCHLOROTHIAZIDE 25 MG TABS (HYDROCHLOROTHIAZIDE) Take 1 tablet by mouth once a day  #90 x 0   Entered by:   Adella Hare LPN   Authorized by:   Syliva Overman MD   Signed by:   Adella Hare LPN on 61/60/7371   Method used:   Electronically to        [facility]* (retail)       [address]       [address] Shipping/mailing       [place], [place]  [address]       Ph: [phone]       Fax: [phone]   RxID:   [id number] IBUPROFEN 800 MG TABS (IBUPROFEN) Take 1 tablet by mouth three times a day  #90 x 0   Entered by:   Adella Hare LPN   Authorized by:   Syliva Overman MD   Signed by:   Adella Hare LPN on 07/19/8526   Method used:   Electronically to        [facility]* (retail)       [address]       [address] Shipping/mailing       [place], [place]  [address]       Ph: [phone]       Fax: [phone]   RxID:   [id number] KLOR-CON 20 MEQ PACK (POTASSIUM CHLORIDE) Take 1 tablet by mouth once a day  #90 x 0   Entered by:   Adella Hare LPN   Authorized by:   Syliva Overman MD   Signed by:   Adella Hare LPN on 38/25/0539   Method used:   Electronically to        [facility]* (retail)       [address]       [address] Shipping/mailing       [place], [place]  [address]  Ph: [phone]       Fax: [phone]   RxID:   [id number]

## 2010-10-25 NOTE — Progress Notes (Signed)
Summary: please advise  Phone Note Call from Patient   Summary of Call: PATIENT states she had a mammogram earlier this year, she said that yesterday her left breast hurt like a boil, she said it was really sore she said it wasn't as sore during the day but it is still tender, it has been 5 years since she had breast cancer in that breast.  [phone] is her call back number, she states she had slept in a recliner for a little bit but even in the past when she has did this her breast has never been sore.   Initial call taken by: Curtis Sites,  August 16, 2010 4:25 PM  Follow-up for Phone Call        ov next week pls for eval. if soreness worsens will need to go to urgent care or Ed for breast pain, I cannot be certain if this is a boil/abcesss which would need urgent attention, or if she needs a rept mamo without clinical eval, i left her a msg to call the office  Follow-up by: Syliva Overman MD,  August 17, 2010 7:49 AM  Additional Follow-up for Phone Call Additional follow up Details #1::        appt. 11.28.11 @ 2:30 Additional Follow-up by: Lind Guest,  August 17, 2010 8:16 AM

## 2010-10-25 NOTE — Assessment & Plan Note (Signed)
Summary: ALLERGIES- room 1   Vital Signs:  Patient profile:   50 year old female Menstrual status:  postmenopausal Height:      61.5 inches Weight:      184.50 pounds BMI:     34.42 O2 Sat:      98 % on Room air Pulse rate:   96 / minute Resp:     16 per minute BP sitting:   102 / 80  (left arm)  Vitals Entered By: Adella Hare LPN (March 18, 2010 8:30 AM) CC: sinus congestion, sneezing, and coughing Is Patient Diabetic? No Pain Assessment Patient in pain? no      Comments did not bring meds to office   Primary Provider:  Syliva Overman MD  CC:  sinus congestion, sneezing, and and coughing.  History of Present Illness: Pt presents today with > 1 week hx of nasal congestion and sneezing.  Has been going on for a few weeks no. She is also having some cough, this is worse in the evenings.  She states she has a hx of allergies, some yrs are worse than others.  She is not taking any allergy meds. Her nasal mucus has been clear in color. No sinus pressure or fever.  She has had a sinus infection in the past & this does not feel the same.    Allergies (verified): No Known Drug Allergies  Past History:  Past medical history reviewed for relevance to current acute and chronic problems.  Past Medical History: Reviewed history from 01/17/2008 and no changes required. 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 ENT:  Complains of nasal congestion and postnasal drainage; denies earache, sinus pressure, and sore throat. CV:  Denies chest pain or discomfort. Resp:  Complains of cough; denies shortness of breath and sputum productive. Allergy:  Complains of seasonal allergies and sneezing.  Physical Exam  General:  Well-developed,well-nourished,in no acute distress; alert,appropriate and cooperative  throughout examination Head:  Normocephalic and atraumatic without obvious abnormalities. No apparent alopecia or balding. Ears:  External ear exam shows no significant lesions or deformities.  Otoscopic examination reveals clear canals, tympanic membranes are intact bilaterally without bulging, retraction, inflammation or discharge. Hearing is grossly normal bilaterally. Nose:  External nasal examination shows no deformity or inflammation. Nasal mucosa are pink and moist without lesions or exudates.no sinus percussion tenderness.   Mouth:  Oral mucosa and oropharynx without lesions or exudates.  Teeth in good repair. Neck:  No deformities, masses, or tenderness noted. Lungs:  Normal respiratory effort, chest expands symmetrically. Lungs are clear to auscultation, no crackles or wheezes. Heart:  Normal rate and regular rhythm. S1 and S2 normal without gallop, murmur, click, rub or other extra sounds. Cervical Nodes:  No lymphadenopathy noted Psych:  Cognition and judgment appear intact. Alert and cooperative with normal attention span and concentration. No apparent delusions, illusions, hallucinations   Impression & Recommendations:  Problem # 1:  ALLERGIC RHINITIS CAUSE UNSPECIFIED (ICD-477.9) Assessment Deteriorated Rx also sent for antibiotic.  Discussed with pt that if her congestion does not improve or if it worsens then to start the antibiotic prescription. That her syptoms seem to be mostly allergy at this time.  Her updated medication list for this problem includes:    Fexofenadine Hcl 180 Mg Tabs (Fexofenadine hcl) .Marland Kitchen... Take 1 daily as needed for allergies  Orders: Depo- Medrol 80mg  (J1040) Admin of Therapeutic  Inj  intramuscular or subcutaneous (57322)  Complete Medication List: 1)  Klor-con 20 Meq Pack (Potassium chloride) .... Take 1 tablet by mouth once a day 2)  Ibuprofen 800 Mg Tabs (Ibuprofen) .... Take 1 tablet by mouth three times a day 3)  Biotin 1000 Mcg Tabs (Biotin)  .... Take 1 tablet by mouth once a day 4)  Lotensin 10 Mg Tabs (Benazepril hcl) .... Take 1 tablet by mouth once a day 5)  Phentermine Hcl 37.5 Mg Tabs (Phentermine hcl) .... Take 1 tablet by mouth once a day 6)  Hydrochlorothiazide 25 Mg Tabs (Hydrochlorothiazide) .... Take 1 tablet by mouth once a day 7)  Fexofenadine Hcl 180 Mg Tabs (Fexofenadine hcl) .... Take 1 daily as needed for allergies 8)  Amoxicillin 500 Mg Caps (Amoxicillin) .... Take 1 three time a day x 10 days  Patient Instructions: 1)  Keep your next appt with Dr Lodema Hong. 2)  I have prescribed an allergy medication for you. 3)  You have received a shot of Depo Medrol today. 4)  If your syptoms dont improve then take the antibiotic (Amoxicillin) prescription. Prescriptions: AMOXICILLIN 500 MG CAPS (AMOXICILLIN) take 1 three time a day x 10 days  #30 x 0   Entered and Authorized by:   Esperanza Sheets PA   Signed by:   Esperanza Sheets PA on 03/18/2010   Method used:   Electronically to        [facility]* (retail)       [address]       [address] Shipping/mailing       [place], [place]  [address]       Ph: [phone]       Fax: [phone]   RxID:   [id number] FEXOFENADINE HCL 180 MG TABS (FEXOFENADINE HCL) take 1 daily as needed for allergies  #30 x 2   Entered and Authorized by:   Esperanza Sheets PA   Signed by:   Esperanza Sheets PA on 03/18/2010   Method used:   Electronically to        [facility]* (retail)       [address]       [address] Shipping/mailing       [place], [place]  [address]       Ph: [phone]       Fax: [phone]   RxID:   [id number]    Medication Administration  Injection # 1:    Medication: Depo- Medrol 80mg     Diagnosis: ALLERGIC RHINITIS CAUSE UNSPECIFIED (ICD-477.9)    Route: IM    Site: LUOQ gluteus    Exp Date: 2/12    Lot #: [id number]    Mfr: [employer]    Patient tolerated injection without complications    Given by: Adella Hare LPN (March 18, 2010 11:03 AM)  Orders Added: 1)  Depo- Medrol 80mg  [J1040] 2)  Admin of Therapeutic Inj  intramuscular or subcutaneous [96372] 3)  Est. Patient Level III [43154]

## 2010-10-25 NOTE — Progress Notes (Signed)
Summary: [facility]  [facility]   Imported By: Lind Guest 02/10/2010 08:39:04  _____________________________________________________________________  External Attachment:    Type:   Image     Comment:   External Document

## 2010-10-25 NOTE — Assessment & Plan Note (Signed)
Summary: office visit   Vital Signs:  Patient profile:   50 year old female Menstrual status:  postmenopausal Height:      61.5 inches Weight:      190.25 pounds BMI:     35.49 O2 Sat:      98 % on Room air Pulse rate:   87 / minute Pulse rhythm:   regular Resp:     16 per minute BP sitting:   130 / 100  (right arm)  Vitals Entered By: Everitt Amber LPN (November 15, 2009 11:08 AM)  Nutrition Counseling: Patient's BMI is greater than 25 and therefore counseled on weight management options.  O2 Flow:  Room air CC: Follow up chronic problems   Primary Care Provider:  Syliva Overman MD  CC:  Follow up chronic problems.  History of Present Illness: Reports  that she has been  doing well. Denies recent fever or chills. Denies sinus pressure, nasal congestion , ear pain or sore throat. Denies chest congestion, or cough productive of sputum. Denies chest pain, palpitations, PND, orthopnea or leg swelling. Denies abdominal pain, nausea, vomitting, diarrhea or constipation. Denies change in bowel movements or bloody stool. Denies dysuria , frequency, incontinence or hesitancy. Denies  joint pain, swelling, or reduced mobility. Denies headaches, vertigo, seizures. Denies depression, anxiety or insomnia. Denies  rash, lesions, or itch. She is not losing weight, not exercising and currently on no apetite suppressant, wants to resume one and to commit to lifestyle change.     Current Medications (verified): 1)  Klor-Con 20 Meq Pack (Potassium Chloride) .... Take 1 Tablet By Mouth Once A Day 2)  Ibuprofen 800 Mg Tabs (Ibuprofen) .... Take 1 Tablet By Mouth Three Times A Day 3)  Biotin 1000 Mcg Tabs (Biotin) .... Take 1 Tablet By Mouth Once A Day 4)  Hydrochlorothiazide 25 Mg Tabs (Hydrochlorothiazide) .... Take 1 Tablet By Mouth Once A Day  Allergies (verified): No Known Drug Allergies  Review of Systems Eyes:  Denies blurring and discharge. Endo:  Denies cold intolerance,  excessive hunger, excessive thirst, excessive urination, heat intolerance, polyuria, and weight change. Heme:  Denies abnormal bruising and bleeding. Allergy:  Denies hives or rash and itching eyes.   Impression & Recommendations:  Problem # 1:  HYPERLIPIDEMIA (ICD-272.4) Assessment Comment Only    HDL:52 (01/25/2009), 57 (01/20/2008)  LDL:106 (01/25/2009), 114 (01/20/2008)  Chol:175 (01/25/2009), 183 (01/20/2008)  Trig:85 (01/25/2009), 61 (01/20/2008)  low fat diet discussed and encouragd  Problem # 2:  OBESITY (ICD-278.00) Assessment: Deteriorated  Ht: 61.5 (11/15/2009)   Wt: 190.25 (11/15/2009)   BMI: 35.49 (11/15/2009)low carb diet and regular exercise discussed and enccouraged , pt to start phentermine half daily  Problem # 3:  HYPERTENSION (ICD-401.9) Assessment: Unchanged  Her updated medication list for this problem includes:    Hydrochlorothiazide 25 Mg Tabs (Hydrochlorothiazide) .Marland Kitchen... Take 1 tablet by mouth once a day    Lotensin 10 Mg Tabs (Benazepril hcl) .Marland Kitchen... Take 1 tablet by mouth once a day  BP today: 130/100 Prior BP: 140/100 (08/16/2009)  Labs Reviewed: K+: 4.5 (01/25/2009) Creat: : 0.60 (01/25/2009)   Chol: 175 (01/25/2009)   HDL: 52 (01/25/2009)   LDL: 106 (01/25/2009)   TG: 85 (01/25/2009)  Complete Medication List: 1)  Klor-con 20 Meq Pack (Potassium chloride) .... Take 1 tablet by mouth once a day 2)  Ibuprofen 800 Mg Tabs (Ibuprofen) .... Take 1 tablet by mouth three times a day 3)  Biotin 1000 Mcg Tabs (Biotin) .... Take 1  tablet by mouth once a day 4)  Hydrochlorothiazide 25 Mg Tabs (Hydrochlorothiazide) .... Take 1 tablet by mouth once a day 5)  Lotensin 10 Mg Tabs (Benazepril hcl) .... Take 1 tablet by mouth once a day 6)  Phentermine Hcl 37.5 Mg Tabs (Phentermine hcl) .... Take 1 tablet by mouth once a day  Patient Instructions: 1)  f/u in 7 weeks, your BP is 130/100, i am adding a new med, pls continue the HCTZ. 2)  break phentermine in hALF ,  and taake half daily. 3)  It is important that you exercise regularly at least 20 minutes 5 times a week. If you develop chest pain, have severe difficulty breathing, or feel very tired , stop exercising immediately and seek medical attention. 4)  You need to lose weight. Consider a lower calorie diet and regular exercise.  Prescriptions: PHENTERMINE HCL 37.5 MG TABS (PHENTERMINE HCL) Take 1 tablet by mouth once a day  #30 x 0   Entered by:   Adella Hare LPN   Authorized by:   Syliva Overman MD   Signed by:   Adella Hare LPN on 16/06/9603   Method used:   Printed then faxed to ...       [facility]* (retail)       [address]       [address] Shipping/mailing       [place], [place]  [address]       Ph: [phone]       Fax: [phone]   RxID:   [id number] PHENTERMINE HCL 37.5 MG TABS (PHENTERMINE HCL) Take 1 tablet by mouth once a day  #30 x 0   Entered and Authorized by:   Syliva Overman MD   Signed by:   Syliva Overman MD on 11/15/2009   Method used:   Printed then faxed to ...       [facility]* (retail)       [address]       [address] Shipping/mailing       [place], [place]  [address]       Ph: [phone]       Fax: [phone]   RxID:   [id number] LOTENSIN 10 MG TABS (BENAZEPRIL HCL) Take 1 tablet by mouth once a day  #90 x 0   Entered and Authorized by:   Syliva Overman MD   Signed by:   Syliva Overman MD on 11/15/2009   Method used:   Electronically to        [facility] [address] [facility] (retail)       [address]       [place], [place]  [address]       Ph: [phone]       Fax: [phone]   RxID:   [id number]

## 2010-10-25 NOTE — Assessment & Plan Note (Signed)
Summary: ov   Vital Signs:  Patient profile:   50 year old female Menstrual status:  postmenopausal Height:      61.5 inches Weight:      168.75 pounds BMI:     31.48 O2 Sat:      98 % on Room air Pulse rate:   103 / minute Pulse rhythm:   regular Resp:     16 per minute BP sitting:   120 / 78  (left arm)  Vitals Entered By: Adella Hare LPN (August 22, 2010 2:36 PM)  Nutrition Counseling: Patient's BMI is greater than 25 and therefore counseled on weight management options.  O2 Flow:  Room air CC: left breast was really sore last week Is Patient Diabetic? No Pain Assessment Patient in pain? no      Comments did not bring meds to ov   Primary Care Kadisha Goodine:  Syliva Overman MD  CC:  left breast was really sore last week.  History of Present Illness: left breast pain severe last Monday, was a 10. took ibuprofen 800mg  one tablet only the pain went away after 5 days Reports  that she is otherwise doing extremely well. She is involved in a health coach session at [facility] which has proved to be extremely beneficial and supportive as far as her weight loss efforts are conceerned.e  Denies recent fever or chills. Denies sinus pressure, nasal congestion , ear pain or sore throat. Denies chest congestion, or cough productive of sputum. Denies chest pain, palpitations, PND, orthopnea or leg swelling. Denies abdominal pain, nausea, vomitting, diarrhea or constipation. Denies change in bowel movements or bloody stool. Denies dysuria , frequency, incontinence or hesitancy. Denies  joint pain, swelling, or reduced mobility. Denies headaches, vertigo, seizures. Denies depression, anxiety or insomnia. Denies  rash, lesions, or itch.     Allergies (verified): 1)  Ace Inhibitors  Review of Systems      See HPI Eyes:  Denies blurring and discharge. Endo:  Denies cold intolerance, excessive hunger, excessive thirst, and excessive urination. Heme:  Denies abnormal  bruising and bleeding. Allergy:  Denies hives or rash and itching eyes.  Physical Exam  General:  Well-developed,well-nourished,in no acute distress; alert,appropriate and cooperative throughout examination HEENT: No facial asymmetry,  EOMI, No sinus tenderness, TM's Clear, oropharynx  pink and moist.   Chest: Clear to auscultation bilaterally.  Breasts;left breast tender,no palpable masses, nipple d/c or axillary nodes CVS: S1, S2, No murmurs, No S3.   Abd: Soft, Nontender.  MS: Adequate ROM spine, hips, shoulders and knees.  Ext: No edema.   CNS: CN 2-12 intact, power tone and sensation normal throughout.   Skin: Intact, no visible lesions or rashes.  Psych: Good eye contact, normal affect.  Memory intact, not anxious or depressed appearing.    Impression & Recommendations:  Problem # 1:  MASTALGIA (ICD-611.71) Assessment Comment Only mamo referral  Problem # 2:  OBESITY (ICD-278.00) Assessment: Improved  Ht: 61.5 (08/22/2010)   Wt: 168.75 (08/22/2010)   BMI: 31.48 (08/22/2010)  Problem # 3:  HYPERTENSION (ICD-401.9) Assessment: Unchanged  Her updated medication list for this problem includes:    Hydrochlorothiazide 25 Mg Tabs (Hydrochlorothiazide) .Marland Kitchen... Take 1 tablet by mouth once a day  BP today: 120/78 Prior BP: 116/80 (07/11/2010)  Labs Reviewed: K+: 4.1 (11/15/2009) Creat: : 0.62 (11/15/2009)   Chol: 201 (11/15/2009)   HDL: 60 (11/15/2009)   LDL: 124 (11/15/2009)   TG: 84 (11/15/2009)  Complete Medication List: 1)  Biotin 1000 Mcg  Tabs (Biotin) .... Take 1 tablet by mouth once a day 2)  Phentermine Hcl 37.5 Mg Tabs (Phentermine hcl) .... Take 1 tablet by mouth once a day 3)  Hydrochlorothiazide 25 Mg Tabs (Hydrochlorothiazide) .... Take 1 tablet by mouth once a day 4)  Vitamin D 1000 Unit Tabs (Cholecalciferol) .... One tab by mouth once daily 5)  Cetirizine Hcl 10 Mg Tabs (Cetirizine hcl) .... One tab by mouth once daily 6)  Ibuprofen 800 Mg Tabs (Ibuprofen)  .... Take 1 tablet by mouth three times a day as needed for pain 7)  Klor-con M20 20 Meq Cr-tabs (Potassium chloride crys cr) .... Take 1 tablet by mouth once a day  Other Orders: Radiology Referral (Radiology)  Patient Instructions: 1)  F/U in 3 months 2)  You will be referred for a mamogram due to breast pain, which is new 3)  No med changes Prescriptions: PHENTERMINE HCL 37.5 MG TABS (PHENTERMINE HCL) Take 1 tablet by mouth once a day  #30 x 2   Entered by:   Adella Hare LPN   Authorized by:   Syliva Overman MD   Signed by:   Adella Hare LPN on 16/06/9603   Method used:   Printed then faxed to ...       [facility]* (retail)       [address]       [address] Shipping/mailing       [place], [place]  [address]       Ph: [phone]       Fax: [phone]   RxID:   [id number] KLOR-CON M20 20 MEQ CR-TABS (POTASSIUM CHLORIDE CRYS CR) Take 1 tablet by mouth once a day  #90 x 1   Entered and Authorized by:   Syliva Overman MD   Signed by:   Syliva Overman MD on 08/22/2010   Method used:   Electronically to        [facility]* (retail)       [address]       [address] Shipping/mailing       [place], [place]  [address]       Ph: [phone]       Fax: [phone]   RxID:   [id number]    Orders Added: 1)  Radiology Referral [Radiology] 2)  Est. Patient Level IV [[address]]

## 2010-10-25 NOTE — Progress Notes (Signed)
Summary: MEDICINE  Phone Note Call from Patient   Summary of Call: NEEDS HER  HYDROCHLOROTHIAZIDE SENT TO [facility]. TODAY SO IT WILL BE HERE TOM. Initial call taken by: Lind Guest,  March 17, 2010 9:07 AM  Follow-up for Phone Call        advise sent in Follow-up by: Syliva Overman MD,  March 17, 2010 12:05 PM  Additional Follow-up for Phone Call Additional follow up Details #1::        called patient, no answer Additional Follow-up by: Adella Hare LPN,  March 17, 2010 12:14 PM

## 2010-10-25 NOTE — Assessment & Plan Note (Signed)
Summary: physical   Vital Signs:  Patient profile:   50 year old female Menstrual status:  postmenopausal Height:      61.5 inches Weight:      183 pounds BMI:     34.14 O2 Sat:      97 % Pulse rate:   106 / minute Pulse rhythm:   regular Resp:     16 per minute BP sitting:   110 / 80  Vitals Entered By: Everitt Amber LPN (April 04, 2010 10:49 AM)  Nutrition Counseling: Patient's BMI is greater than 25 and therefore counseled on weight management options. CC: CPE   Vision Screening:Left eye w/o correction: 20 / 25 Right Eye w/o correction: 20 / 30 Both eyes w/o correction:  20/ 25  Color vision testing: normal      Vision Entered By: Everitt Amber LPN (April 04, 2010 10:52 AM)   Primary Care Provider:  Syliva Overman MD  CC:  CPE .  History of Present Illness: Reports  that she has been doing well. Denies recent fever or chills. She is still exp[eriencing uncontrolled allergy symptoms of nasal congestion , runny eyes and post nasal drainage, she filled no script, I advisd her to call in for claritin or zyrtec. She denies sinus pressure, ear pain or sore throat. Denies chest congestion, or cough productive of sputum. Denies chest pain, palpitations, PND, orthopnea or leg swelling. Denies abdominal pain, nausea, vomitting, diarrhea or constipation. Denies change in bowel movements or bloody stool. Denies dysuria , frequency, incontinence or hesitancy. Denies  joint pain, swelling, or reduced mobility. Denies headaches, vertigo, seizures. Denies depression, anxiety or insomnia. Denies  rash, lesions, or itch. She is requesting that she be placed back on phentermine to assist with weight loss, and she intends to commit to regular exercise again.     Current Medications (verified): 1)  Klor-Con 20 Meq Pack (Potassium Chloride) .... Take 1 Tablet By Mouth Once A Day 2)  Ibuprofen 800 Mg Tabs (Ibuprofen) .... Take 1 Tablet By Mouth Three Times A Day 3)  Biotin 1000 Mcg  Tabs (Biotin) .... Take 1 Tablet By Mouth Once A Day 4)  Lotensin 10 Mg Tabs (Benazepril Hcl) .... Take 1 Tablet By Mouth Once A Day 5)  Phentermine Hcl 37.5 Mg Tabs (Phentermine Hcl) .... Take 1 Tablet By Mouth Once A Day 6)  Hydrochlorothiazide 25 Mg Tabs (Hydrochlorothiazide) .... Take 1 Tablet By Mouth Once A Day  Allergies (verified): No Known Drug Allergies  Review of Systems      See HPI General:  Complains of fatigue. Eyes:  Denies discharge, eye pain, and red eye. Endo:  Denies cold intolerance, excessive hunger, excessive thirst, excessive urination, heat intolerance, polyuria, and weight change. Heme:  Denies abnormal bruising and bleeding. Allergy:  Complains of seasonal allergies.  Physical Exam  General:  Well-developed,well-nourished,in no acute distress; alert,appropriate and cooperative throughout examination Head:  Normocephalic and atraumatic without obvious abnormalities. No apparent alopecia or balding. Eyes:  No corneal or conjunctival inflammation noted. EOMI. Perrla. Funduscopic exam benign, without hemorrhages, exudates or papilledema. Vision grossly normal. Ears:  External ear exam shows no significant lesions or deformities.  Otoscopic examination reveals clear canals, tympanic membranes are intact bilaterally without bulging, retraction, inflammation or discharge. Hearing is grossly normal bilaterally. Nose:  External nasal examination shows no deformity or inflammation. Nasal mucosa are pink and moist without lesions or exudates. Mouth:  Oral mucosa and oropharynx without lesions or exudates.  Teeth in good repair. Neck:  No deformities, masses, or tenderness noted. Chest Wall:  No deformities, masses, or tenderness noted. Breasts:  No mass, nodules, thickening, tenderness, bulging, retraction, inflamation, nipple discharge or skin changes noted.   Lungs:  Normal respiratory effort, chest expands symmetrically. Lungs are clear to auscultation, no crackles or  wheezes. Heart:  Normal rate and regular rhythm. S1 and S2 normal without gallop, murmur, click, rub or other extra sounds. Abdomen:  Bowel sounds positive,abdomen soft and non-tender without masses, organomegaly or hernias noted. Rectal:  No external abnormalities noted. Normal sphincter tone. No rectal masses or tenderness.guaic neg stool Genitalia:  Normal introitus for age, no external lesions, no vaginal discharge, mucosa pink and moist, no vaginal or cervical lesions, no vaginal atrophy, no friaility or hemorrhage, normal uterus size and position, no adnexal masses or tenderness Msk:  No deformity or scoliosis noted of thoracic or lumbar spine.   Pulses:  R and L carotid,radial,femoral,dorsalis pedis and posterior tibial pulses are full and equal bilaterally Extremities:  No clubbing, cyanosis, edema, or deformity noted with normal full range of motion of all joints.   Neurologic:  No cranial nerve deficits noted. Station and gait are normal. Plantar reflexes are down-going bilaterally. DTRs are symmetrical throughout. Sensory, motor and coordinative functions appear intact. Skin:  Intact without suspicious lesions or rashes Cervical Nodes:  No lymphadenopathy noted Axillary Nodes:  No palpable lymphadenopathy Inguinal Nodes:  No significant adenopathy Psych:  Cognition and judgment appear intact. Alert and cooperative with normal attention span and concentration. No apparent delusions, illusions, hallucinations   Impression & Recommendations:  Problem # 1:  SPECIAL SCREENING FOR MALIGNANT NEOPLASMS COLON (ICD-V76.51) Assessment Comment Only  Orders: Hemoccult Guaiac-1 spec.(in office) (82270)  Problem # 2:  SCREENING FOR MALIGNANT NEOPLASM OF THE CERVIX (ICD-V76.2) Assessment: Comment Only  Orders: Pap Smear (02585)  Problem # 3:  OBESITY (ICD-278.00) Assessment: Unchanged  Ht: 61.5 (04/04/2010)   Wt: 183 (04/04/2010)   BMI: 34.14 (04/04/2010)  Problem # 4:  HYPERTENSION  (ICD-401.9) Assessment: Unchanged  Her updated medication list for this problem includes:    Lotensin 10 Mg Tabs (Benazepril hcl) .Marland Kitchen... Take 1 tablet by mouth once a day    Hydrochlorothiazide 25 Mg Tabs (Hydrochlorothiazide) .Marland Kitchen... 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 Take 1 tablet by mouth once a day 2)  Ibuprofen 800 Mg Tabs (Ibuprofen) .... Take 1 tablet by mouth three times a day 3)  Biotin 1000 Mcg Tabs (Biotin) .... Take 1 tablet by mouth once a day 4)  Lotensin 10 Mg Tabs (Benazepril hcl) .... Take 1 tablet by mouth once a day 5)  Phentermine Hcl 37.5 Mg Tabs (Phentermine hcl) .... Take 1 tablet by mouth once a day 6)  Hydrochlorothiazide 25 Mg Tabs (Hydrochlorothiazide) .... Take 1 tablet by mouth once a day  Other Orders: T-CBC w/Diff ([id number]) T-TSH ([id number])  Patient Instructions: 1)  Please schedule a follow-up appointment in 3 months. 2)  It is important that you exercise regularly at least 45 minutes 7 times a week. If you develop chest pain,  have severe difficulty breathing, or feel very tired , stop exercising immediately and seek medical attention. 3)  You need to lose weight. Consider a lower calorie diet and regular exercise. Goal is 8 pounds in the next 3 months. Congrats on current weight loss. 4)  Call for claritin or zyrtec at the pharmacy 5)  BMP prior to visit, ICD-9: 6)  TSH prior to visit, ICD-9:   fasting end October 7)  CBC w/ Diff prior to visit, ICD-9: Prescriptions: PHENTERMINE HCL 37.5 MG TABS  (PHENTERMINE HCL) Take 1 tablet by mouth once a day  #30 x 2   Entered by:   Everitt Amber LPN   Authorized by:   Syliva Overman MD   Signed by:   Everitt Amber LPN on 09/81/1914   Method used:   Printed then faxed to ...       [facility] (retail)       [address]       [place], [place]  [address]       Ph: [phone]       Fax: [phone]   RxID:   [id number]   Laboratory Results    Stool - Occult Blood Hemmoccult #1: negative Date: 04/04/2010 Comments: 50201 10L 2/13 118/1012    Appended Document: physical PAP was done in office but after review it was 3 weeks too early for insurance to pay and was discarded. Patient aware we will recollect in Oct visit

## 2010-10-25 NOTE — Letter (Signed)
Summary: Letter  Letter   Imported By: Lind Guest 07/20/2010 14:36:01  _____________________________________________________________________  External Attachment:    Type:   Image     Comment:   External Document

## 2010-10-25 NOTE — Progress Notes (Signed)
Summary: speak with nurse  Phone Note Call from Patient   Summary of Call: pt needs to speak with nurse about meds. [phone] Initial call taken by: Rudene Anda,  June 02, 2010 3:01 PM    Prescriptions: HYDROCHLOROTHIAZIDE 25 MG TABS (HYDROCHLOROTHIAZIDE) Take 1 tablet by mouth once a day  #90 x 1   Entered by:   Everitt Amber LPN   Authorized by:   Syliva Overman MD   Signed by:   Everitt Amber LPN on 45/40/9811   Method used:   Electronically to        [facility]* (retail)       [address]       [address] Shipping/mailing       [place], [place]  [address]       Ph: [phone]       Fax: [phone]   RxID:   [id number]

## 2010-10-25 NOTE — Assessment & Plan Note (Signed)
Summary: F UP   Vital Signs:  Patient profile:   50 year old female Menstrual status:  postmenopausal Height:      61.5 inches Weight:      191.25 pounds BMI:     35.68 O2 Sat:      95 % Pulse rate:   96 / minute Pulse rhythm:   regular Resp:     16 per minute BP sitting:   128 / 84  (right arm) Cuff size:   large  Vitals Entered By: Everitt Amber LPN (January 03, 2010 10:52 AM)  Nutrition Counseling: Patient's BMI is greater than 25 and therefore counseled on weight management options. CC: Follow up chronic problems Is Patient Diabetic? No   Primary Care Provider:  Syliva Overman MD  CC:  Follow up chronic problems.  History of Present Illness: Reports  thatshe has been  doing well. Denies recent fever or chills. Denies sinus pressure, nasal congestion , ear pain or sore throat. Denies chest congestion, or cough productive of sputum. Denies chest pain, palpitations, PND, orthopnea or leg swelling. Denies abdominal pain, nausea, vomitting, diarrhea or constipation. Denies change in bowel movements or bloody stool. Denies dysuria , frequency, incontinence or hesitancy. Denies  joint pain, swelling, or reduced mobility. Denies headaches, vertigo, seizures. Denies depression, anxiety or insomnia. Denies  rash, lesions, or itch. she has not  been diligent in either exercise or diet, and does not take the phentermine daily, asa result she has actually gained weight, sheis being given the next 3 months to correct this.      Current Medications (verified): 1)  Klor-Con 20 Meq Pack (Potassium Chloride) .... Take 1 Tablet By Mouth Once A Day 2)  Ibuprofen 800 Mg Tabs (Ibuprofen) .... Take 1 Tablet By Mouth Three Times A Day 3)  Biotin 1000 Mcg Tabs (Biotin) .... Take 1 Tablet By Mouth Once A Day 4)  Hydrochlorothiazide 25 Mg Tabs (Hydrochlorothiazide) .... Take 1 Tablet By Mouth Once A Day 5)  Lotensin 10 Mg Tabs (Benazepril Hcl) .... Take 1 Tablet By Mouth Once A Day 6)   Phentermine Hcl 37.5 Mg Tabs (Phentermine Hcl) .... Take 1 Tablet By Mouth Once A Day  Allergies (verified): No Known Drug Allergies  Review of Systems      See HPI Eyes:  Denies blurring and discharge. Endo:  Denies cold intolerance, excessive hunger, excessive thirst, excessive urination, heat intolerance, polyuria, and weight change. Heme:  Denies abnormal bruising and bleeding. Allergy:  Complains of seasonal allergies and sneezing; increased allergy symptoms x 2 weeks, not unexpected.  Physical Exam  General:  alert, well-hydrated, and overweight-appearing.  HEENT: No facial asymmetry,  EOMI, no maxillary  sinus tenderness, TM's Clear, oropharynx  pink and moist.   Chest: Clear to auscultation bilaterally.  CVS: S1, S2, No murmurs, No S3.   Abd: Soft, Nontender.  MS: Adequate ROM spine, hips, shoulders and knees.  Ext: No edema.   CNS: CN 2-12 intact, power tone and sensation normal throughout.   Skin: Intact, no visible lesions or rashes.  Psych: Good eye contact, normal affect.  Memory intact, not anxious or depressed appearing.     Impression & Recommendations:  Problem # 1:  ALLERGIC RHINITIS CAUSE UNSPECIFIED (ICD-477.9) Assessment Deteriorated pt to call in for zyrtec or claritin  Problem # 2:  HYPERLIPIDEMIA (ICD-272.4) Assessment: Comment Only    HDL:60 (11/15/2009), 52 (01/25/2009)  LDL:124 (11/15/2009), 106 (01/25/2009)  Chol:201 (11/15/2009), 175 (01/25/2009)  Trig:84 (11/15/2009), 85 (01/25/2009) low fat diet  info given and duiscussed  Problem # 3:  HYPERTENSION (ICD-401.9) Assessment: Improved  Her updated medication list for this problem includes:    Hydrochlorothiazide 25 Mg Tabs (Hydrochlorothiazide) .Marland Kitchen... Take 1 tablet by mouth once a day    Lotensin 10 Mg Tabs (Benazepril hcl) .Marland Kitchen... 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 Take 1 tablet by mouth once a day 2)  Ibuprofen 800 Mg Tabs (Ibuprofen) .... Take 1 tablet by mouth three times a day 3)  Biotin 1000 Mcg Tabs (Biotin) .... Take 1 tablet by mouth once a day 4)  Hydrochlorothiazide 25 Mg Tabs (Hydrochlorothiazide) .... Take 1 tablet by mouth once a day 5)  Lotensin 10 Mg Tabs (Benazepril hcl) .... Take 1 tablet by mouth once a day 6)  Phentermine Hcl 37.5 Mg Tabs (Phentermine hcl) .... Take 1 tablet by mouth once a day  Other Orders: Radiology Referral (Radiology)  Patient Instructions: 1)  cPE first week in july. 2)  we will schedyour mamo. 3)  blood pressure is excellent. 4)  It is important that you exercise regularly at least 30 minutes 5 times a week. If you develop chest pain, have severe difficulty breathing, or feel very tired , stop exercising immediately and seek medical attention. 5)  You need to lose weight. Consider a lower calorie diet and regular exercise. goal of weight loss is  6 to 12 pounds. 6)  pls follow a low fat diet , your cholesterol is high Prescriptions: PHENTERMINE HCL 37.5 MG TABS (PHENTERMINE HCL) Take 1 tablet by mouth once a day  #30 x 2   Entered by:   Everitt Amber LPN   Authorized by:   Syliva Overman MD   Signed by:   Everitt Amber LPN on 64/33/2951   Method used:   Printed then faxed to ...       [facility] (retail)       [address]       [place], [place]  [address]       Ph: [phone]       Fax: [phone]   RxID:   [id number]

## 2010-10-25 NOTE — Progress Notes (Signed)
Summary: pills  Phone Note Call from Patient   Summary of Call: needs her pot pills 90 day supply called at [facility]. and her diet pills to [facility]  90 day supply called [facility] and cancel that Initial call taken by: Lind Guest,  April 11, 2010 8:45 AM  Follow-up for Phone Call        phentermine cancelled at [facility], sent to [facility] rx sent as requested Follow-up by: Adella Hare LPN,  April 11, 2010 10:08 AM    Prescriptions: PHENTERMINE HCL 37.5 MG TABS (PHENTERMINE HCL) Take 1 tablet by mouth once a day  #30 x 2   Entered by:   Adella Hare LPN   Authorized by:   Syliva Overman MD   Signed by:   Adella Hare LPN on 09/27/7251   Method used:   Printed then faxed to ...       [facility]* (retail)       [address]       [address] Shipping/mailing       [place], [place]  [address]       Ph: [phone]       Fax: [phone]   RxID:   [id number] KLOR-CON 20 MEQ PACK (POTASSIUM CHLORIDE) Take 1 tablet by mouth once a day  #90 x 0   Entered by:   Adella Hare LPN   Authorized by:   Syliva Overman MD   Signed by:   Adella Hare LPN on 93/23/5573   Method used:   Electronically to        [facility]* (retail)       [address]       [address] Shipping/mailing       [place], [place]  [address]       Ph: [phone]       Fax: [phone]   RxID:   [id number]

## 2010-10-27 NOTE — Progress Notes (Signed)
Summary: MEDICINE  Phone Note Call from Patient   Summary of Call: NEEDS HER RX SEND TO [facility] THEY SAID SHE NEEDED TO CALL IN HER DIET AND BP PILLS  Initial call taken by: Lind Guest,  September 21, 2010 9:05 AM  Follow-up for Phone Call        okay to fill diet pill? Follow-up by: Adella Hare LPN,  September 21, 2010 11:25 AM  Additional Follow-up for Phone Call Additional follow up Details #1::        refill x 2 pls Additional Follow-up by: Syliva Overman MD,  September 21, 2010 11:47 AM    Prescriptions: PHENTERMINE HCL 37.5 MG TABS (PHENTERMINE HCL) Take 1 tablet by mouth once a day  #30 x 1   Entered by:   Adella Hare LPN   Authorized by:   Syliva Overman MD   Signed by:   Adella Hare LPN on 40/98/1191   Method used:   Printed then faxed to ...       [facility]* (retail)       [address]       [address] Shipping/mailing       [place], [place]  [address]       Ph: [phone]       Fax: [phone]   RxID:   [id number] HYDROCHLOROTHIAZIDE 25 MG TABS (HYDROCHLOROTHIAZIDE) Take 1 tablet by mouth once a day  #90 x 0   Entered by:   Adella Hare LPN   Authorized by:   Syliva Overman MD   Signed by:   Adella Hare LPN on 44/11/4740   Method used:   Electronically to        [facility]* (retail)       [address]       [address] Shipping/mailing       [place], [place]  [address]       Ph: [phone]       Fax: [phone]   RxID:   [id number]

## 2010-11-21 ENCOUNTER — Ambulatory Visit (INDEPENDENT_AMBULATORY_CARE_PROVIDER_SITE_OTHER): Payer: Commercial Managed Care - PPO | Admitting: Family Medicine

## 2010-11-21 ENCOUNTER — Encounter: Payer: Self-pay | Admitting: Family Medicine

## 2010-11-21 DIAGNOSIS — I10 Essential (primary) hypertension: Secondary | ICD-10-CM

## 2010-11-21 DIAGNOSIS — E785 Hyperlipidemia, unspecified: Secondary | ICD-10-CM

## 2010-11-21 DIAGNOSIS — E669 Obesity, unspecified: Secondary | ICD-10-CM

## 2010-12-01 NOTE — Assessment & Plan Note (Signed)
Summary: follow up   Vital Signs:  Patient profile:   50 year old female Menstrual status:  postmenopausal Height:      61.5 inches Weight:      165 pounds BMI:     30.78 O2 Sat:      99 % Pulse rate:   89 / minute Pulse rhythm:   regular Resp:     16 per minute BP sitting:   120 / 88  (right arm) Cuff size:   large  Vitals Entered By: Everitt Amber LPN (November 21, 2010 8:43 AM)  Nutrition Counseling: Patient's BMI is greater than 25 and therefore counseled on weight management options. CC: Follow up chronic problems   Primary Care Provider:  Syliva Overman MD  CC:  Follow up chronic problems.  History of Present Illness: Reports  that  she is oing well. She is a bit disappointed with the fact that her weight loss ahs been poor this past several months. She is wilingto stop the pheentermine and work harder on nutrition and exercise. Denies recent fever or chills. Denies sinus pressure, nasal congestion , ear pain or sore throat. Denies chest congestion, or cough productive of sputum. Denies chest pain, palpitations, PND, orthopnea or leg swelling. Denies abdominal pain, nausea, vomitting, diarrhea or constipation. Denies change in bowel movements or bloody stool. Denies dysuria , frequency, incontinence or hesitancy. Denies  joint pain, swelling, or reduced mobility. Denies headaches, vertigo, seizures. Denies depression, anxiety or insomnia. Denies  rash, lesions, or itch.     Current Medications (verified): 1)  Biotin 1000 Mcg Tabs (Biotin) .... Take 1 Tablet By Mouth Once A Day 2)  Phentermine Hcl 37.5 Mg Tabs (Phentermine Hcl) .... Take 1 Tablet By Mouth Once A Day 3)  Hydrochlorothiazide 25 Mg Tabs (Hydrochlorothiazide) .... Take 1 Tablet By Mouth Once A Day 4)  Vitamin D 1000 Unit Tabs (Cholecalciferol) .... One Tab By Mouth Once Daily 5)  Cetirizine Hcl 10 Mg Tabs (Cetirizine Hcl) .... One Tab By Mouth Once Daily 6)  Ibuprofen 800 Mg Tabs (Ibuprofen) ....  Take 1 Tablet By Mouth Three Times A Day As Needed For Pain 7)  Klor-Con M20 20 Meq Cr-Tabs (Potassium Chloride Crys Cr) .... Take 1 Tablet By Mouth Once A Day  Allergies (verified): 1)  Ace Inhibitors  Review of Systems      See HPI Eyes:  Complains of vision loss-both eyes; new glasses x 1 wek, probs adjusting. Endo:  Denies cold intolerance, excessive hunger, excessive thirst, excessive urination, and heat intolerance. Heme:  Denies abnormal bruising and bleeding. Allergy:  Complains of seasonal allergies.  Physical Exam  General:  Well-developed,well-nourished,in no acute distress; alert,appropriate and cooperative throughout examination HEENT: No facial asymmetry,  EOMI, No sinus tenderness, TM's Clear, oropharynx  pink and moist.   Chest: Clear to auscultation bilaterally.  CVS: S1, S2, No murmurs, No S3.   Abd: Soft, Nontender.  MS: Adequate ROM spine, hips, shoulders and knees.  Ext: No edema.   CNS: CN 2-12 intact, power tone and sensation normal throughout.   Skin: Intact, no visible lesions or rashes.  Psych: Good eye contact, normal affect.  Memory intact, not anxious or depressed appearing.    Impression & Recommendations:  Problem # 1:  HYPERLIPIDEMIA (ICD-272.4) Assessment Comment Only Low fat dietdiscussed and encouraged, lDL elevated to 133, otherwise nl  Problem # 2:  OBESITY (ICD-278.00) Assessment: Unchanged  Ht: 61.5 (11/21/2010)   Wt: 165 (11/21/2010)   BMI: 30.78 (11/21/2010) therapeutic lifestyle  change discussed and encouraged 1200 and 1500 cal diet sheets provided. d/c phentermine  Problem # 3:  HYPERTENSION (ICD-401.9) Assessment: Deteriorated  Her updated medication list for this problem includes:    Hydrochlorothiazide 25 Mg Tabs (Hydrochlorothiazide) .Marland Kitchen... Take 1 tablet by mouth once a day Patient advised to follow low sodium diet rich in fruit and vegetables, and to commit to at least 30 minutes 5 days per week of regular exercise , to  improve blood presure control.   BP today: 120/88 Prior BP: 120/78 (08/22/2010)  Labs Reviewed: K+: 4.1 (11/15/2009) Creat: : 0.62 (11/15/2009)   Chol: 201 (11/15/2009)   HDL: 60 (11/15/2009)   LDL: 124 (11/15/2009)   TG: 84 (11/15/2009)  Complete Medication List: 1)  Biotin 1000 Mcg Tabs (Biotin) .... Take 1 tablet by mouth once a day 2)  Hydrochlorothiazide 25 Mg Tabs (Hydrochlorothiazide) .... Take 1 tablet by mouth once a day 3)  Vitamin D 1000 Unit Tabs (Cholecalciferol) .... One tab by mouth once daily 4)  Cetirizine Hcl 10 Mg Tabs (Cetirizine hcl) .... One tab by mouth once daily 5)  Ibuprofen 800 Mg Tabs (Ibuprofen) .... Take 1 tablet by mouth three times a day as needed for pain 6)  Klor-con M20 20 Meq Cr-tabs (Potassium chloride crys cr) .... Take 1 tablet by mouth once a day  Patient Instructions: 1)  Please schedule a follow-up appointment in 4 months. 2)  It is important that you exercise regularly at least 40 minutes 5 times a week. If you develop chest pain, have severe difficulty breathing, or feel very tired , stop exercising immediately and seek medical attention. 3)  You need to lose weight. Consider a lower calorie diet and regular exercise. We will give you a 1200 and 1500 cal diet sheet, also a handout on free obesity classes 4)  No med changes 5)  Mamo dueMay 2012.Pls schedule   Orders Added: 1)  Est. Patient Level IV [[address]]

## 2010-12-19 ENCOUNTER — Other Ambulatory Visit: Payer: Self-pay | Admitting: Family Medicine

## 2010-12-20 MED ORDER — PHENTERMINE HCL 37.5 MG PO CAPS: 37.5000 mg | ORAL_CAPSULE | ORAL | Status: DC

## 2011-01-01 LAB — BASIC METABOLIC PANEL
BUN: 12 mg/dL (ref 6–23)
CO2: 29 mEq/L (ref 19–32)
GFR calc non Af Amer: >60 mL/min (ref >60)
Glucose, Bld: 86 mg/dL (ref 70–99)
Potassium: 4.2 mEq/L (ref 3.5–5.1)

## 2011-01-01 LAB — CBC
HCT: 38.2 % (ref 36.0–46.0)
Hemoglobin: 13.3 g/dL (ref 12.0–15.0)
MCHC: 34.7 g/dL (ref 30.0–36.0)
RDW: 13 % (ref 11.5–15.5)

## 2011-01-10 ENCOUNTER — Other Ambulatory Visit: Payer: Self-pay | Admitting: Family Medicine

## 2011-01-12 MED ORDER — POTASSIUM CHLORIDE CRYS ER 20 MEQ PO TBCR: 20.0000 meq | EXTENDED_RELEASE_TABLET | Freq: Every day | ORAL | Status: DC

## 2011-01-30 ENCOUNTER — Encounter (HOSPITAL_COMMUNITY): Payer: 59 | Attending: Oncology

## 2011-01-30 DIAGNOSIS — C50919 Malignant neoplasm of unspecified site of unspecified female breast: Secondary | ICD-10-CM

## 2011-02-07 NOTE — Op Note (Signed)
[name], [name]                ACCOUNT NO.:  [id number]   MEDICAL RECORD NO.:  [id number]          PATIENT TYPE:  AMB   LOCATION:  SDC                           FACILITY:  [facility]   PHYSICIAN:  Maxie Better, M.D.DATE OF BIRTH:  11-04-1960   DATE OF PROCEDURE:  04/01/2009  DATE OF DISCHARGE:  04/01/2009                               OPERATIVE REPORT   PREOPERATIVE DIAGNOSES:  Postmenopausal bleeding, question endometrial  mass.   PROCEDURE:  Diagnostic hysteroscopy, dilation and curettage.   POSTOPERATIVE DIAGNOSES:  Postmenopausal bleeding, endometrial polyp  versus submucosal fibroid.   ANESTHESIA:  General paracervical block.   SURGEON:  Maxie Better, MD   ASSISTANT:  None.   PROCEDURE:  Under adequate general anesthesia, the patient was placed in  the dorsal lithotomy position.  Examination under anesthesia revealed  anteverted uterus.  No adnexal masses could be appreciated.  The patient  was sterilely prepped and draped in usual fashion.  Bladder catheterized  moderate amount of urine.  Bivalve second stitch was placed in the  vagina.  A 10 mL of 1% Nesacaine was injected at 3 and 9 o'clock  paracervically.  The anterior lip of the cervix was grasped with a  single-tooth tenaculum.  The cervix was then dilated up to #25 [facility]  dilator.  A sorbitol primed diagnostic hysteroscope was introduced into  the uterine cavity.  Endocervical canal has been inspected and showed no  lesions.  The tubal ostia was seen bilaterally.  There was about 1 cm  lesion in the anterior wall of the uterine cavity, question polyp versus  submucosal fibroid.  The diagnostic hysteroscope was removed and the  cervix was then attempted to be dilated further to facilitate a  resectoscope, however, this was unsuccessful.  A decision was then made  to do a sharp curettage.  The cavity was therefore curetted sharply and  reinsertion of the diagnostic hysteroscope revealed that the lesion  had  been noted anteriorly was removed.  At that point, all instruments were  then removed from the vagina.   SPECIMEN:  No endometrial curetting with polyp versus fibroid was sent  to pathology.  Estimated blood loss was minimal.  Fluid deficit was 100  mL.   COMPLICATIONS:  None.  The patient tolerated the procedure well and was  transferred to the recovery room in stable condition.      Maxie Better, M.D.  Electronically Signed     [place]/MEDQ  D:  04/01/2009  T:  04/02/2009  Job:  [id number]

## 2011-02-10 NOTE — Consult Note (Signed)
[name] [name], [name] [name]                ACCOUNT NO.:  [id number]   MEDICAL RECORD NO.:  [id number]          PATIENT TYPE:  INP   LOCATION:  A211                          FACILITY:  [facility]   PHYSICIAN:  Kofi A. Gerilyn Pilgrim, M.D. DATE OF BIRTH:  1961-09-09   DATE OF CONSULTATION:  DATE OF DISCHARGE:                                   CONSULTATION   CONSULTING PHYSICIAN:  Kofi A. Gerilyn Pilgrim, M.D.   IMPRESSION:  1.  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  The complete      resolution of her symptoms and MRI findings do not indicate metastasis      of her baseline breast cancer to the brain.  2.  Breast cancer, status post left mastectomy.  3.  Hypertension.   RECOMMENDATIONS:  1.  She had an extensive evaluation in progress which I agree with.  2.  I would add thyroid function tests.   HISTORY:  This is a 50 year old African-American female who has a history of  hypertension diagnosed two years ago. However, she unfortunately was  recently diagnosed with breast cancer and is undergoing chemotherapy.  She  is status post left mastectomy and radiation therapy.  The patient had a  chemotherapy treatment yesterday and was driving home when she developed the  acute onset of severe headache, slurred speech, and right upper extremity  tingling and numbness.  She denied any heaviness.  She did have blurry  vision bilaterally.  These symptoms lasted for about an hour.  She was left,  however, with residual headache which lasted for about 20 hours.  Her  symptoms are currently now all resolved.  She did see her primary care  physician about 24 hours after her symptoms, and she was admitted for  further evaluation.   PAST MEDICAL HISTORY:  1.  Breast cancer, status post left mastectomy.  She is status post      radiation therapy and is  currently undergoing chemotherapy.  2.  History of hypertension.   CURRENT MEDICATIONS:  1.  Aspirin started for the first time today.  2.  Diovan/HCTZ 80/12.5 daily.  3.  Multivitamin.  4.  Lovenox.   SOCIAL HISTORY:  The patient is married, has two children.  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   FAMILY HISTORY:  Significant for hypertension, diabetes, congestive heart  failure, and coronary artery disease.   REVIEW OF SYSTEMS:  As stated in the history of present illness.  The  symptoms are currently totally resolved.  She apparently used to smoke a few  cigarettes per day about 3 or so.  She has quit several months ago.   PHYSICAL EXAMINATION:  GENERAL:  Shows a moderately overweight lady in no  acute distress.  VITAL SIGNS:  Temperature 97.8, pulse 103, respirations 20, blood pressure  122/99.  HEENT:  Neck is supple.  Head:  Normocephalic, atraumatic.  LUNGS:  Clear  to auscultation bilaterally.  CARDIOVASCULAR:  Shows normal S1 S2.  No murmurs, rubs, or gallops heard.  ABDOMEN:  Obese but soft.  EXTREMITIES:  No significant varicosities or edema.  MENTATION:  The patient is sleeping but easily aroused.  She is lucid.  No  aphasia is noted.  She is coherent with no dysarthria noted.  CRANIAL NERVES:  Pupils are 4-mm and briskly reactive.  Visual fields are  intact.  Extraocular movements are full.  Facial muscle strength is  symmetric.  Tongue is midline.  The uvula is midline.  Funduscopic  examination shows flat disks with spontaneous venous pulsations.  MOTOR:  Shows normal tone, bulk, and strength.  Coordination is intact.  Reflexes are diminished throughout but plantar reflexes were downgoing.  SENSORY:  Normal to temperature and light touch.   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  There are other subcortical and   juxtacortical tiny hyper-intensities in the frontal lobes bilaterally.  There is also some mild leukoencephalopathy around the frontal horns.  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  No acute findings are observed on diffusion  imaging.  I did not see any abnormal enhancements.  MRA of the brain  intracranial looks good, extra-cranial, carotid, and vessels look fine.  WBC  7, hemoglobin 13, platelet count of 444.   Thanks for this consultation.      Kofi A. Gerilyn Pilgrim, M.D.  Electronically Signed     [facility]/MEDQ  D:  06/23/2005  T:  06/23/2005  Job:  [id number]

## 2011-02-10 NOTE — H&P (Signed)
[name] [name], [name] [name]                ACCOUNT NO.:  [id number]   MEDICAL RECORD NO.:  [id number]          PATIENT TYPE:  INP   LOCATION:  A211                          FACILITY:  [facility]   PHYSICIAN:  Osvaldo Shipper, MD     DATE OF BIRTH:  1961-08-07   DATE OF ADMISSION:  06/23/2005  DATE OF DISCHARGE:  LH                                HISTORY & PHYSICAL   PRIMARY MEDICAL DOCTOR:  Milus Mallick. Lodema Hong, M.D.   ADMITTING DIAGNOSES:  1.  Transient ischemic attack rule out cerebrovascular accident.  2.  Hypertension.  3.  History of breast cancer.   CHIEF COMPLAINT:  Right hand numbness with headache and blurred vision.   HISTORY OF PRESENT ILLNESS:  The patient is a 50 year old, African-American  female with a past medical history of hypertension, and a recent diagnosis  of breast cancer for which the patient underwent a left partial mastectomy  and lymph node dissection back in March of this year.  The patient has  undergone multiple cycles of chemotherapy and is currently on [facility]  [facility].  The patient was  apparently well yesterday when she was being driven back by her sister when  she experienced blurred vision which was followed by 10/10 bilateral  headache, followed by the patient saying that she had inability to think  correctly and could not speak properly for the next hour or so.  These  symptoms were followed by numbness of her right hand; however, she did not  feel any focal weakness anywhere else.  The patient did not have any  episodes of syncope, lightheadedness.  There was no episode of any urinary  or bowel incontinence.  There was no episode of any seizures.  No history of  chest pain, shortness of breath, palpitations, diaphoresis.  The patient  noticed that her ability to speak properly returned later that evening.  She  continued to have headache through the night.  This morning, when she woke  up, all of her symptoms  had subsided.  Currently, the patient is completely  asymptomatic.  The patient subsequently went to her PMD's office this  evening where Dr. Early Chars spoke with Kofi A. [place]on[place]ah, M.D. and because of the  history of cancer and her history it was thought that the patient should be  admitted for further evaluation.   MEDICATIONS AT HOME:  1.  Diovan HCT 80/12.5 one p.o. daily.  2.  Multivitamin 1 p.o. daily.  3.  [facility]   ALLERGIES:  No known drug allergies.   PAST MEDICAL HISTORY:  1.  Hypertension.  2.  Recent history of breast cancer.  This is determined to be stage T1c,      N1, ER negative, HER-2 positive, invasive ductal carcinoma of the left      breast.  This is determined to be stage IIA.  The patient also apart      from chemotherapy, she has undergone radiation therapy.  3.  The patient also gives history of cholecystectomy.  4.  Bilateral  tubal ligation in the past.   SOCIAL HISTORY:  The patient lives in [place] with her husband and 2  children.  She quit smoking in January of 2006.  She was smoking about 3-4  cigarettes per day.  No history of any alcohol use or any illicit drug use.   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   FAMILY HISTORY:  Her father has significant history of heart disease with  CABG surgeries.  He also has a history of prostate cancer and hypertension.  Her mother died of a massive heart attack.  Two sisters and a brother who  have hypertension, CHF, and diabetes among them.  No history of strokes or  any lung disease or any other cancers in the family.   REVIEW OF SYSTEMS:  A 10 point review of systems was done which was  unremarkable, except as mentioned under HPI.   PHYSICAL EXAMINATION:  VITAL SIGNS:  Temperature is 97.8, heart rate 103,  respiratory rate is 16, blood pressure is elevated at 152/109.  She weighs  206 pounds.  GENERAL EXAM:  This is an overweight woman in no apparent  distress, slightly  anxious.  HEENT:  There is no pallor, icterus.  Oral mucous membranes are moist.  No  lymph nodes are seen.  NECK:  Is soft and supple.  LUNGS:  Are clear to auscultation bilaterally.  CARDIOVASCULAR:  S1 and S2 normal.  Regular.  No S3 or S4.  No murmurs  appreciated.  No carotid bruits are heard.  ABDOMEN:  Is soft, non-tender, nondistended.  No mass or organomegaly.  Bowel sounds present.  EXTREMITIES:  Without edema.  Peripheral pulses are palpable.  NEUROLOGIC:  The patient is alert and oriented x3 with normal speech.  CRANIAL NERVES:  Exam of II-XII within normal limits.  No difference noted.  MOTOR EXAM:  5/5 strength in all muscle groups bilaterally upper and lower  extremities.  SENSORY EXAM:  Is within normal limits.  CEREBELLAR SIGNS:  Within normal limits.  No pronator drift noted.  GAIT:  Appeared to be within normal limits as well.  Romberg sign was  negative.  Reflexes equal bilaterally.  Plantars were downgoing bilaterally.   At this point, there are no lab studies or imaging studies available to [place].   IMPRESSION:  This is a 50 year old African-American female with history of  hypertension and recent history of breast cancer for which she has undergone  partial mastectomy, chemotherapy, and radiation therapy who presents with an  episode of blurred vision, headache, right hand numbness which is suspicious  for TIA.  Other differential includes migraine headache.  The patient is to  be admitted to the hospital for further evaluation.  Her risks of having a  thrombolic event is high considering her history of cancer.   PLAN:  1.  Neurological:  Will obtain imaging of her brain.  Will check an ESR,      homocysteine, RPR, TSH.  Will do all of the preliminary lab work.  Will      also do an EKG and a chest x-ray.  Echocardiogram will be considered;      however, being the weekend, it cannot be done in this hospital.  Carotid     Doppler ultrasounds  will also be      ordered; however, possibility of them being done over the weekend is      again less likely.  Consultation with  Kofi A. Gerilyn Pilgrim, M.D. will be      obtained.   Further management decisions will based on results of intial testing and  patient's response to treatment.      Osvaldo Shipper, MD  Electronically Signed     GK/MEDQ  D:  06/23/2005  T:  06/23/2005  Job:  [id number]   cc:   Milus Mallick. Lodema Hong, M.D.  Fax: [phone]   Kofi A. Gerilyn Pilgrim, M.D.  Fax: [phone]

## 2011-02-10 NOTE — Op Note (Signed)
NAMECHERYLYN, SUNDBY                ACCOUNT NO.:  [id number]   MEDICAL RECORD NO.:  [id number]          PATIENT TYPE:  AMB   LOCATION:  DAY                           FACILITY:  [facility]   PHYSICIAN:  Jerolyn Shin C. Katrinka Blazing, M.D.   DATE OF BIRTH:  March 10, 1961   DATE OF PROCEDURE:  11/25/2004  DATE OF DISCHARGE:  11/25/2004                                 OPERATIVE REPORT   PREOPERATIVE DIAGNOSIS:  Left breast carcinoma.   POSTOPERATIVE DIAGNOSIS:  Left breast carcinoma.   PROCEDURE:  Right subclavian Port-A-Cath insertion.   SURGEON:  Elpidio Anis, M.D.   DESCRIPTION:  the procedure was done under MAC anesthesia was 1% Xylocaine  local infiltration.  The patient was taken to the operating suite.  Her  right chest, neck and arm were prepped and draped in sterile field. Local  infiltration with 1% Xylocaine was carried out.  The patient was placed in  reversed Trendelenburg position.  The subclavian vein was accessed without  difficulty.  A guidewire was placed under fluoroscopic guidance.  Incision  was made for the pocket and the pocket was developed.  The catheter was  tunneled from the insertion site down to the pocket.  Using an introducer  set, the catheter was positioned in the superior vena cava under  fluoroscopic guidance.  It was attached to the chamber and flushed.  The  chamber was placed in the pocket and secured to the fascia was 3-0 Prolene.  The system was then flushed with heparin.  The position was confirmed with  fluoroscopy.  The pocket was closed with 3-0 Monocryl and 4-0 Vicryl.  Dressings were placed.  The patient tolerated procedure well.  She was  transferred to a bed and taken to the day surgery area for short-term  monitoring and for follow-up chest x-ray      LCS/MEDQ  D:  12/25/2004  T:  12/26/2004  Job:  [id number]

## 2011-02-10 NOTE — Op Note (Signed)
[name] [name], [name] [name]                ACCOUNT NO.:  [id number]   MEDICAL RECORD NO.:  [id number]          PATIENT TYPE:  AMB   LOCATION:  DAY                           FACILITY:  [facility]   PHYSICIAN:  Jerolyn Shin C. Katrinka Blazing, M.D.   DATE OF BIRTH:  1961-08-23   DATE OF PROCEDURE:  10/18/2004  DATE OF DISCHARGE:                                 OPERATIVE REPORT   PREOPERATIVE DIAGNOSIS:  Infiltrating ductal carcinoma, left breast.   POSTOPERATIVE DIAGNOSIS:  Infiltrating ductal carcinoma, left breast.   OPERATION PERFORMED:  Left partial mastectomy with axillary node dissection.   SURGEON:  Dirk Dress. Katrinka Blazing, M.D.   ANESTHESIA:  General.   DESCRIPTION OF PROCEDURE:  Under general anesthesia, the patient's left  breast, chest, arm and axilla were prepped and draped in a sterile field.  An elliptical incision was made in the lower inner quadrant encompassing the  area of the previous biopsy.  Dissection was continued from the nipple down  over to the sternum.  The dissection was continued down to the chest wall  using electrocautery.  The mass was excised en bloc.  It was sent for  pathologic evaluation.  Deep tissues were closed in layers using three  layers of running 2-0 Monocryl.  Subcutaneous tissue was closed with 3-0  Monocryl.  Skin was closed with subcuticular 4-0 Dexon.  Next a longitudinal  incision was made along the lateral border of the pectoralis at the level of  the axilla.  The dissection was continued down to the superficial and deep  axillary fascia.  Dissection was continued up to the area immediately below  the axillary vein.  All fibroareolar tissue in this area was removed.  Thoradodorsal neurovascular bundle and the long thoracic nerve of Bell were  preserved.  The vessels were isolated, clipped and divided.  Intercostal  brachial cutaneous nerve was clipped and divided.  The tissue was then  removed.  Jackson-Pratt drain was placed.  Hemostasis was felt to be  adequate.  The  incision was closed with 2-0 Monocryl, 3-0 Monocryl and 4-0  Dexon.  Dressings were placed on each incision.  The patient tolerated the  procedure well.  She was awakened from anesthesia, transferred to the bed  and taken to the post anesthesia care unit for further monitoring.      LCS/MEDQ  D:  10/18/2004  T:  10/18/2004  Job:  [id number]

## 2011-02-10 NOTE — Discharge Summary (Signed)
[name], [name]                ACCOUNT NO.:  [id number]   MEDICAL RECORD NO.:  [id number]          PATIENT TYPE:  INP   LOCATION:  A323                          FACILITY:  [facility]   PHYSICIAN:  Dirk Dress. Katrinka Blazing, M.D.   DATE OF BIRTH:  Oct 14, 1960   DATE OF ADMISSION:  10/18/2004  DATE OF DISCHARGE:  01/26/2006LH                                 DISCHARGE SUMMARY   DISCHARGE DIAGNOSES:  1.  Infiltrating ductal carcinoma left breast.  2.  Hypertension.   SPECIAL PROCEDURE:  Partial mastectomy with node dissection, January 24.   DISPOSITION:  The patient discharged home in stable satisfactory condition.   DISCHARGE MEDICATIONS:  1.  Tylox two q.4h. p.r.n. pain.  2.  Doxycycline 100 mg b.i.d. x5 days.  3.  Her Diovan is not to be taken until she returns to the office in two      weeks.   She will home health nursing for dressing changes and JP management until  wound closure and drains are removed.   SUMMARY:  A 50 year old female with a history of a massive left breast node  on routine mammography.  Core biopsy on January 11 revealed infiltrating  ductal carcinoma.  The patient was referred for completion surgery.  Surgical options were discussed with her and she opted for partial  mastectomy with node dissection.  Her only other medical problem was  hypertension.  The patient underwent left partial mastectomy with axillary  node dissection on January 24.  She had no perioperative problems and was  discharged home on the morning of the second postoperative day in  satisfactory condition.      LCS/MEDQ  D:  12/04/2004  T:  12/05/2004  Job:  [id number]

## 2011-02-10 NOTE — Procedures (Signed)
NAMEJONIQUE, KULIG                ACCOUNT NO.:  [id number]   MEDICAL RECORD NO.:  [id number]          PATIENT TYPE:  REC   LOCATION:  SPCLP                         FACILITY:  [facility]   PHYSICIAN:  Saluda Bing, M.D.  DATE OF BIRTH:  [birth year]   DATE OF PROCEDURE:  11/17/2004  DATE OF DISCHARGE:  11/18/2004                                  ECHOCARDIOGRAM   REFERRING PHYSICIAN:  [name]. Mariel Sleet, M.D.   CLINICAL DATA:  A 50 year old woman with neoplastic disease; study performed  in conjunction with chemotherapy.   M-MODE:  Aorta  2.6  Left atrium  3.1  Septum  1.1  Posterior wall  0.9  LV diastole  4.6  LV systole  3.4   1.  Technically adequate echocardiographic study.  2.  Normal left atrium, right atrium, and right ventricle.  3.  Normal aortic, mitral, tricuspid, and pulmonic valves.  Normal proximal      pulmonary artery.  4.  Normal left ventricular size; no LVH.  Normal regional and global LV      systolic function.  Estimated ejection fraction is 60%.  5.  Normal IVC.  6.  Normal Doppler study.      RR/MEDQ  D:  11/20/2004  T:  11/21/2004  Job:  [id number]

## 2011-02-10 NOTE — Procedures (Signed)
NAMESHAUNIECE, KWAN                ACCOUNT NO.:  [id number]   MEDICAL RECORD NO.:  [id number]          PATIENT TYPE:  REC   LOCATION:  RAD                           FACILITY:  [facility]   PHYSICIAN:  Vida Roller, M.D.   DATE OF BIRTH:  1961-08-05   DATE OF PROCEDURE:  DATE OF DISCHARGE:                                  ECHOCARDIOGRAM   PRIMARY CARE PHYSICIAN:  Dr. Mariel Sleet at the cancer center at [facility].   TAPE NUMBER:  [id number].   TAPE COUNT:  [id number] through [phone].   This is a woman who has received chemotherapy for breast cancer.  Last echo  was in February which showed an ejection fraction of 60%.  Today's study is  technically adequate.   MO TRACINGS:  The aorta is 29 mm.   The left atrium is 39 mm.   The septum is 10 mm.   The posterior wall is 10 mm.   The left ventricular diastolic dimension is 45 mm.   Left ventricular systolic dimension is 34 mm.   2-D AND DOPPLER IMAGING:  The left ventricle is normal size with normal  systolic function.  Estimated ejection fraction 50 to 55%.  There are no  wall motion abnormalities seen.   The right ventricle is normal size with normal systolic function.  Both  atria appear to be normal size.   The aortic valve is morphologically unremarkable with no stenosis or  regurgitation.   The mitral valve is morphologically unremarkable with no stenosis or  regurgitation.   The tricuspid valve is morphologically unremarkable with no stenosis or  regurgitation.   The pulmonic valve was not well seen.   There is no pericardial effusion.   The inferior vena cava appears to be normal size.   The ascending aorta was not well seen.      JH/MEDQ  D:  01/11/2005  T:  01/11/2005  Job:  [id number]

## 2011-02-10 NOTE — Procedures (Signed)
NAMEJASIE, MELESKI                ACCOUNT NO.:  [id number]   MEDICAL RECORD NO.:  [id number]          PATIENT TYPE:  REC   LOCATION:                                FACILITY:  [facility]   PHYSICIAN:  Gerrit Friends. Dietrich Pates, MD, FACCDATE OF BIRTH:  Jan 22, 1961   DATE OF PROCEDURE:  10/30/2006  DATE OF DISCHARGE:                                ECHOCARDIOGRAM   REFERRING PHYSICIAN:  [name]. Neijstrom, MD.   CLINICAL DATA:  A 50 year old woman undergoing chemotherapy for  neoplastic disease.   Aorta 2.6, left atrium 3.0, septum 0.9, posterior wall 0.9, LV diastole  4.3, LV systole 3.1.   1. Technically adequate echocardiographic study.  2. Normal left atrium, right atrium, and right ventricle.  3. Normal aorta, mitral, tricuspid, and pulmonic valves; normal      proximal pulmonary artery.  4. Normal IVC.  5. Normal internal dimension, wall thickness, regional global function      of the left ventricle. Estimated ejection fraction is 0.60.  6. Comparison with prior study of November 27, 2005: No significant      interval change.      Gerrit Friends. Dietrich Pates, MD, [facility]  Electronically Signed     RMR/MEDQ  D:  10/31/2006  T:  10/31/2006  Job:  [id number]

## 2011-02-10 NOTE — Procedures (Signed)
[name] [name], [name] [name]                ACCOUNT NO.:  [id number]   MEDICAL RECORD NO.:  [id number]          PATIENT TYPE:  OUT   LOCATION:  RAD                           FACILITY:  [facility]   PHYSICIAN:  Vida Roller, M.D.   DATE OF BIRTH:  [birth year]   DATE OF PROCEDURE:  09/27/2005  DATE OF DISCHARGE:                                  ECHOCARDIOGRAM   PRIMARY CARE PHYSICIAN:  Dr. Lodema Hong   REFERRING PHYSICIAN:  Dr. Mariel Sleet   STUDY DATE:  September 27, 2005   TAPE NUMBER:  Glenice Laine COUNT:  [id number]   CLINICAL INFORMATION:  This is a 50 year old woman with breast cancer status  post chemo.  This is for LV systolic function.  Technical quality of the  study is adequate.   M-MODE:  AORTA:  27 mm  (<4.0)  LEFT ATRIUM:  37 mm  (<4.0)  SEPTUM:  9 mm  (0.7-1.1)  POSTERIOR WALL:  9 mm  (0.7-1.1)  LV-DIASTOLE:  48 mm  (<5.7)  LV-SYSTOLE:  36 mm  (<4.0)   TWO-DIMENSIONAL AND DOPPLER IMAGING:  The left ventricle is normal size with  normal systolic function.  Estimated ejection fraction is 50-55%.   The right ventricle is normal size with normal systolic function.   Both atria are normal size.   There is no pericardial effusion.   There is no obvious valvular heart disease.   There is no interval change from an echo done in October 2006.      Vida Roller, M.D.  Electronically Signed     JH/MEDQ  D:  09/27/2005  T:  09/27/2005  Job:  [id number]

## 2011-02-10 NOTE — H&P (Signed)
[name] [name], [name] [name]                ACCOUNT NO.:  [id number]   MEDICAL RECORD NO.:  [id number]          PATIENT TYPE:  AMB   LOCATION:  DAY                           FACILITY:  [facility]   PHYSICIAN:  Jerolyn Shin C. Katrinka Blazing, M.D.   DATE OF BIRTH:  Dec 04, 1960   DATE OF ADMISSION:  DATE OF DISCHARGE:  LH                                HISTORY & PHYSICAL   HISTORY OF PRESENT ILLNESS:  The patient is a 50 year old female with a  history of a breast mass of the left breast noted on routine mammography.  The patient underwent core biopsy on January 11 with pathologic finding of  infiltrating ductal carcinoma.  She was referred for completion of surgery.  Surgical options were discussed and she opt for partial mastectomy with node  dissection.   PAST MEDICAL HISTORY:  She has hypertension.   MEDICATIONS:  Her only medication Diovan/hydrochlorothiazide 80/12.5 daily.   ALLERGIES:  She has no known drug allergies.   PAST SURGICAL HISTORY:  1.  Cholecystectomy.  2.  Tubal ligation.   FAMILY HISTORY:  Negative for breast cancer.  There is positive family  history of atherosclerotic heart disease and hypertension.   PHYSICAL EXAMINATION:  VITAL SIGNS:  Blood pressure 140/80, pulse 80,  respirations 18, weight 177 pounds.  HEENT:  Unremarkable.  NECK:  Supple without JVD or bruits.  CHEST:  Clear to auscultation.  No rales, rubs, rhonchi.  HEART:  Regular rate and rhythm without murmur, gallop or rub.  BREASTS:  Multinodular breasts with a small punctate, healing puncture site  in the left lower inner quadrant with fullness in the left lower inner  quadrant with previous biopsy.  No lymph nodes noted in either axilla.  Right breast is multinodular without dominant lesion.  ABDOMEN:  Soft, mildly obese.  Nontender.  No masses.  Normal bowel sounds.  EXTREMITIES:  No cyanosis, clubbing or edema.  NEUROLOGIC:  Cranial nerves intact.  No focal motor, sensory or cerebellar  deficits.   IMPRESSION:  1.   Infiltrating ductal carcinoma, left breast, biopsy proven.  2.  Hypertension.   PLAN:  Left partial mastectomy and node dissection.      LCS/MEDQ  D:  10/17/2004  T:  10/18/2004  Job:  [id number]   cc:   [facility]  Fax: [phone]

## 2011-02-10 NOTE — H&P (Signed)
[name] [name], [name] [name]                ACCOUNT NO.:  [id number]   MEDICAL RECORD NO.:  [id number]          PATIENT TYPE:  AMB   LOCATION:  DAY                           [facility]ACILITY:  [facility]   PHYSICIAN:  Jerolyn Shin C. Katrinka Blazing, M.D.   DATE OF BIRTH:  11/01/1960   DATE OF ADMISSION:  DATE OF DISCHARGE:  LH                                HISTORY & PHYSICAL   HISTORY OF PRESENT ILLNESS:  A 50 year old female with a history of a left  breast mass who is status post left partial mastectomy with node dissection  on October 18, 2004.  The patient is scheduled to undergo chemotherapy, and  is having venous access port insertion for chemotherapy.   PAST HISTORY:  The patient has a history of hypertension.   MEDICATIONS:  1.  Diovan HCT 80/12.5 daily.  2.  Tylox p.r.n. for pain.   SURGICAL HISTORY:  1.  Cholecystectomy.  2.  Tubal ligation.  3.  Left partial mastectomy with axillary node dissection.   ALLERGIES:  None.   PHYSICAL EXAMINATION:  VITAL SIGNS:  Blood pressure 110/70, pulse 76,  respirations 18, weight 192 pounds.  HEENT:  Unremarkable.  NECK:  Supple.  No JVD, bruit, adenopathy, or thyromegaly.  CHEST:  Clear to auscultation.  HEART:  Regular rate and rhythm without murmur, gallop, or rub.  BREASTS:  Healing operative site, left breast, with asymmetry due to  previous surgery, and operative changes of the left axilla.  Right breast  multi-nodular without dominant mass.  ABDOMEN:  Soft, nontender, no masses.  Normoactive bowel sounds.  EXTREMITIES:  No clubbing, cyanosis, or edema.  Operative changes, left  axilla.  NEUROLOGIC:  No focal motor, sensory, or cerebellar deficit.   IMPRESSION:  1.  Infiltrating ductal carcinoma, left breast, with positive lymph nodes.  2.  Hypertension.   PLAN:  Venous access port insertion, right subclavian.      LCS/MEDQ  D:  11/24/2004  T:  11/24/2004  Job:  [id number]

## 2011-02-10 NOTE — Procedures (Signed)
[name] [name], [name] [name]                ACCOUNT NO.:  [id number]   MEDICAL RECORD NO.:  [id number]          PATIENT TYPE:  REC   LOCATION:                                FACILITY:  [facility]   PHYSICIAN:  [place] Bing, M.D. LHCDATE OF BIRTH:  07/28/1961   DATE OF PROCEDURE:  11/27/2005  DATE OF DISCHARGE:                                  ECHOCARDIOGRAM   REFERRING:  Dr. Glenford Peers   CLINICAL DATA:  A 50 year old woman receiving chemotherapy.   M-MODE:  Aorta 2.6, left atrium 3.3, septum 1, posterior wall 1, LV diastole  4.9, LV systole 3.7.   1.  Technically adequate echocardiographic study.  2.  Normal left atrium, right atrium and right ventricle.  3.  Normal aortic, mitral, tricuspid and pulmonic valves.  4.  Normal Doppler examination with physiologic tricuspid regurgitation.  5.  Normal inferior vena cava.  6.  Normal left ventricular size; no regional nor global functional      abnormality; estimated ejection fraction is 0.55.  7.  Comparison with prior study of September 27, 2005: No significant interval      change.      [place] Bing, M.D. [facility]  Electronically Signed     RR/MEDQ  D:  11/27/2005  T:  11/27/2005  Job:  [id number]

## 2011-02-10 NOTE — Discharge Summary (Signed)
NAMEBRENNEN, GARDINER                ACCOUNT NO.:  [id number]   MEDICAL RECORD NO.:  [id number]          PATIENT TYPE:  INP   LOCATION:  A211                          FACILITY:  [facility]   PHYSICIAN:  Osvaldo Shipper, MD     DATE OF BIRTH:  1961/03/21   DATE OF ADMISSION:  06/23/2005  DATE OF DISCHARGE:  10/01/2006LH                                 DISCHARGE SUMMARY   DISCHARGE DIAGNOSES:  1.  Transient ischemic attack-like symptoms, likely secondary to      hypertensive encephalopathy.  2.  Hypertension.  3.  History of breast cancer, status post chemotherapy and radiation      therapy.   Please review H&P dictated on June 23, 2005 for details regarding the  patient's history of presenting illness.   BRIEF HOSPITAL COURSE:  Briefly, this is a 50 year old African-American  female with a past medical history of hypertension and breast cancer for  which she had undergone left partial mastectomy and chemotherapy and  radiation therapy.  The patient presented to her PMD's office with a one-day  history of slurred speech, right hand numbness, and blurred vision.  The  patient was admitted to the hospital for further evaluation.  Differential  diagnoses include TIA/hypertensive encephalopathy/migraine.  The patient  underwent CAT scan of the head which did not show any acute abnormality.  Subsequently, the patient underwent MRI of the brain with MRA which also did  not show any acute stroke or any occlusions in any of the vessels.  However,  it did show extensive white matter changes, likely secondary to hypertension  or radiation.  The patient was also seen by Kofi A. Gerilyn Pilgrim, M.D. from  neurology who felt that her symptoms were consistent with a hypertensive  encephalopathy.   The patient's symptoms had resolved by the time she was seen by myself in  the hospital.  The patient has had no more new symptoms during in her one-  day stay in the hospital.  She does complain of mild  headache, otherwise  nothing remarkable.  She has been able to ambulate with no difficulties.  Telemetry also did not show any acute event.  An EKG was also quite  unremarkable.   The patient has not had an echocardiogram as part of her evaluation which  will be arranged as an outpatient.  She will also need to undergo carotid  Dopplers as an outpatient.   On the day of discharge, the patient is stable.  Blood pressures have been  very well controlled, and she is considered okay for discharge.   DISCHARGE MEDICATIONS:  1.  Aspirin 325 mg p.o. daily.  2.  Otherwise, no other changes have been made to her home medications.   FOLLOW UP:  1.  Milus Mallick. Lodema Hong, M.D. some time this week.  The patient to call for      appointment.  2.  Echocardiogram some time this week with [facility].      Prescription has been written for the patient, and phone number for      [facility] will be provided.  3.  Carotid Dopplers also some time this week will be scheduled.   DIET:  Low-salt diet.   PHYSICAL ACTIVITY:  1.  No restrictions.  2.  The patient has been told that it is okay for her to return to work      tomorrow.   IMAGING STUDIES DONE DURING THIS HOSPITAL STAY:  1.  CT scan of the head.  2.  MRI/MRA of the head and neck as discussed above.  3.  Chest x-ray was done which did not show any acute abnormality.   CONSULTATIONS:  Kofi A. Gerilyn Pilgrim, M.D., please see his consultation note.      Osvaldo Shipper, MD  Electronically Signed     GK/MEDQ  D:  06/25/2005  T:  06/25/2005  Job:  [id number]   cc:   Milus Mallick. Lodema Hong, M.D.  Fax: [phone]   Kofi A. Gerilyn Pilgrim, M.D.  Fax: [phone]

## 2011-02-10 NOTE — Procedures (Signed)
[name] [name], [name] [name]                ACCOUNT NO.:  [id number]   MEDICAL RECORD NO.:  [id number]          PATIENT TYPE:  OUT   LOCATION:  RAD                           FACILITY:  [facility]   PHYSICIAN:  Vida Roller, M.D.   DATE OF BIRTH:  Jun 06, 1961   DATE OF PROCEDURE:  06/29/2005  DATE OF DISCHARGE:                                  ECHOCARDIOGRAM   PRIMARY CARE PHYSICIAN:  Dr. Lodema Hong.   TAPE NUMBER:  [id number].  TAPE COUNT:  [id number] through 2187.   This is a 50 year old woman with a history of TIA. This is an evaluation for  source of emboli. Technical quality of the study is difficult.   M-MODE TRACING:  The aorta is 26 mm.   The left atrium is 34 mm.   The septum is 12 mm.   Posterior wall is 11 mm.   Left ventricular diastolic dimension is 39 mm.   Left ventricular systolic dimension is 31 mm.   TWO-DIMENSIONAL AND DOPPLER IMAGING:  The left ventricle is normal size with  low normal systolic function estimated at 50 to 55%. There are no obvious  wall motion abnormalities seen. There is mild concentric left ventricular  hypertrophy.   Right ventricle is normal size with normal systolic function.   Both atrial appear to be normal size.   The aortic valve is without stenosis or regurgitation.   The mitral valve is without stenosis or regurgitation.   The tricuspid valve has trivial regurgitation.   There is no pericardial effusion.   Inferior vena cava appears to be small.   The ascending aorta is not well seen.      Vida Roller, M.D.  Electronically Signed     JH/MEDQ  D:  06/29/2005  T:  06/29/2005  Job:  [id number]

## 2011-02-14 ENCOUNTER — Other Ambulatory Visit: Payer: Self-pay | Admitting: Family Medicine

## 2011-02-14 DIAGNOSIS — C50919 Malignant neoplasm of unspecified site of unspecified female breast: Secondary | ICD-10-CM

## 2011-02-15 ENCOUNTER — Telehealth: Payer: Self-pay | Admitting: Family Medicine

## 2011-02-15 NOTE — Telephone Encounter (Signed)
pls call breast center, or [facility] , let them know symptoms and fact pt has had breast ca, see if they can get a sooner appt pls , dx is breast pain, diagnostic mammo to be requested for affected breast , this is iMPT, pls let her know you are working on it (I am still unable to enter the memmogram orders)

## 2011-02-22 ENCOUNTER — Other Ambulatory Visit: Payer: Self-pay | Admitting: Family Medicine

## 2011-02-22 DIAGNOSIS — N644 Mastodynia: Secondary | ICD-10-CM

## 2011-02-22 DIAGNOSIS — Z9889 Other specified postprocedural states: Secondary | ICD-10-CM

## 2011-02-22 DIAGNOSIS — Z853 Personal history of malignant neoplasm of breast: Secondary | ICD-10-CM

## 2011-02-27 ENCOUNTER — Ambulatory Visit
Admission: RE | Admit: 2011-02-27 | Discharge: 2011-02-27 | Disposition: A | Discharge status: Home / Self Care | Payer: 59 | Source: Ambulatory Visit | Attending: Family Medicine | Admitting: Family Medicine

## 2011-02-27 DIAGNOSIS — N644 Mastodynia: Secondary | ICD-10-CM

## 2011-02-27 DIAGNOSIS — Z9889 Other specified postprocedural states: Secondary | ICD-10-CM

## 2011-02-27 DIAGNOSIS — Z853 Personal history of malignant neoplasm of breast: Secondary | ICD-10-CM

## 2011-03-01 ENCOUNTER — Ambulatory Visit (HOSPITAL_COMMUNITY): Payer: 59

## 2011-03-14 ENCOUNTER — Encounter: Payer: Self-pay | Admitting: Family Medicine

## 2011-03-20 ENCOUNTER — Telehealth: Payer: Self-pay | Admitting: Family Medicine

## 2011-03-20 ENCOUNTER — Ambulatory Visit (INDEPENDENT_AMBULATORY_CARE_PROVIDER_SITE_OTHER): Payer: 59 | Admitting: Family Medicine

## 2011-03-20 ENCOUNTER — Encounter: Payer: Self-pay | Admitting: Family Medicine

## 2011-03-20 VITALS — BP 140/96 | HR 85 | Resp 16 | Ht 62.0 in | Wt 174.1 lb

## 2011-03-20 DIAGNOSIS — E669 Obesity, unspecified: Secondary | ICD-10-CM

## 2011-03-20 DIAGNOSIS — I10 Essential (primary) hypertension: Secondary | ICD-10-CM

## 2011-03-20 DIAGNOSIS — E785 Hyperlipidemia, unspecified: Secondary | ICD-10-CM

## 2011-03-20 DIAGNOSIS — J309 Allergic rhinitis, unspecified: Secondary | ICD-10-CM

## 2011-03-20 DIAGNOSIS — R5381 Other malaise: Secondary | ICD-10-CM

## 2011-03-20 DIAGNOSIS — C50919 Malignant neoplasm of unspecified site of unspecified female breast: Secondary | ICD-10-CM

## 2011-03-20 DIAGNOSIS — R5383 Other fatigue: Secondary | ICD-10-CM

## 2011-03-20 MED ORDER — PHENTERMINE HCL 37.5 MG PO TABS: 37.5000 mg | ORAL_TABLET | Freq: Every day | ORAL | Status: DC

## 2011-03-20 MED ORDER — HYDROCHLOROTHIAZIDE 25 MG PO TABS: 25.0000 mg | ORAL_TABLET | Freq: Every day | ORAL | Status: DC

## 2011-03-20 NOTE — Telephone Encounter (Signed)
meds sent as requested 

## 2011-03-20 NOTE — Patient Instructions (Signed)
F/u in 4 months.  It is important that you exercise regularly at least 30 minutes 5 times a week. If you develop chest pain, have severe difficulty breathing, or feel very tired, stop exercising immediately and seek medical attention   A healthy diet is rich in fruit, vegetables and whole grains. Poultry fish, nuts and beans are a healthy choice for protein rather then red meat. A low sodium diet and drinking 64 ounces of water daily is generally recommended. Oils and sweet should be limited. Carbohydrates especially for those who are diabetic or overweight, should be limited to 30-45 gram per meal. It is important to eat on a regular schedule, at least 3 times daily. Snacks should be primarily fruits, vegetables or nuts.  Fasting labs asap

## 2011-03-21 NOTE — Assessment & Plan Note (Signed)
Deteriorated. Patient re-educated about  the importance of commitment to a  minimum of 150 minutes of exercise per week. The importance of healthy food choices with portion control discussed. Encouraged to start a food diary, count calories and to consider  joining a support group. Sample diet sheets offered. Goals set by the patient for the next several months.    

## 2011-03-21 NOTE — Assessment & Plan Note (Signed)
Controlled, no change in medication  

## 2011-03-21 NOTE — Assessment & Plan Note (Signed)
Increased and uncontrolled at this time as is the usual during the Summer months

## 2011-03-21 NOTE — Assessment & Plan Note (Signed)
Normal mammogram earlier this year

## 2011-03-21 NOTE — Assessment & Plan Note (Signed)
Low fat diet discussed and encouraged, needs rept labs prior to next ov

## 2011-03-21 NOTE — Progress Notes (Signed)
  Subjective:    Patient ID: [name] [name], female    DOB: [birth year], 50 y.o.   MRN: [id number]  HPI The PT is here for follow up and re-evaluation of chronic medical conditions, medication management and review of recent lab and radiology data.  Preventive health is updated, specifically  Cancer screening,  and Immunization.   Questions or concerns regarding consultations or procedures which the PT has had in the interim are  addressed. The PT denies any adverse reactions to current medications since the last visit.  There are no new concerns. She is upset with herself for excessive weight gain, but intends to reverse this There are no specific complaints       Review of Systems Denies recent fever or chills. Denies sinus pressure but is noting excessive , nasal congestion,with clear drainage, denies  ear pain or sore throat. Denies chest congestion, productive cough or wheezing. Denies chest pains, palpitations, paroxysmal nocturnal dyspnea, orthopnea and leg swelling Denies abdominal pain, nausea, vomiting,diarrhea or constipation.  Denies rectal bleeding or change in bowel movement. Denies dysuria, frequency, hesitancy or incontinence. Denies joint pain, swelling and limitation in mobility. Denies headaches, seizure, numbness, or tingling. Denies depression, anxiety or insomnia. Denies skin break down or rash.        Objective:   Physical Exam Patient alert and oriented and in no Cardiopulmonary distress.  HEENT: No facial asymmetry, EOMI, no sinus tenderness, TM's clear, Oropharynx pink and moist.  Neck supple no adenopathy.Erythema and edema of nasal mucosa  Chest: Clear to auscultation bilaterally.  CVS: S1, S2 no murmurs, no S3.  ABD: Soft non tender. Bowel sounds normal.  Ext: No edema  MS: Adequate ROM spine, shoulders, hips and knees.  Skin: Intact, no ulcerations or rash noted.  Psych: Good eye contact, normal affect. Memory intact not anxious or  depressed appearing.  CNS: CN 2-12 intact, power, tone and sensation normal throughout.        Assessment & Plan:

## 2011-03-22 ENCOUNTER — Ambulatory Visit: Payer: Commercial Managed Care - PPO | Admitting: Family Medicine

## 2011-06-26 LAB — DIFFERENTIAL
Lymphocytes Relative: 28
Lymphs Abs: 1.9
Monocytes Relative: 7
Neutro Abs: 4.3
Neutrophils Relative %: 62

## 2011-06-26 LAB — COMPREHENSIVE METABOLIC PANEL
ALT: 19
BUN: 10
Calcium: 9.4
Creatinine, Ser: 0.71
Glucose, Bld: 64 — ABNORMAL LOW
Sodium: 140
Total Protein: 6.9

## 2011-06-26 LAB — CBC
Hemoglobin: 13.3
MCHC: 34.4
MCV: 93.7
RDW: 12.6

## 2011-07-06 LAB — CBC
Hemoglobin: 12.5
MCHC: 33.6
MCV: 93.7
RBC: 3.98
WBC: 7.2

## 2011-07-06 LAB — COMPREHENSIVE METABOLIC PANEL
ALT: 18
CO2: 30
Calcium: 9
Chloride: 104
Creatinine, Ser: 0.64
GFR calc non Af Amer: >60
Glucose, Bld: 81
Total Bilirubin: 0.4

## 2011-07-06 LAB — DIFFERENTIAL
Basophils Absolute: 0
Eosinophils Absolute: 0.2
Eosinophils Relative: 3
Lymphocytes Relative: 38
Lymphs Abs: 2.8
Neutrophils Relative %: 52

## 2011-07-20 ENCOUNTER — Encounter: Payer: Self-pay | Admitting: Family Medicine

## 2011-07-24 ENCOUNTER — Other Ambulatory Visit (HOSPITAL_COMMUNITY)
Admission: RE | Admit: 2011-07-24 | Discharge: 2011-07-24 | Disposition: A | Discharge status: Home / Self Care | Payer: 59 | Source: Ambulatory Visit | Attending: Family Medicine | Admitting: Family Medicine

## 2011-07-24 ENCOUNTER — Encounter: Payer: Self-pay | Admitting: Family Medicine

## 2011-07-24 ENCOUNTER — Ambulatory Visit (INDEPENDENT_AMBULATORY_CARE_PROVIDER_SITE_OTHER): Payer: 59 | Admitting: Family Medicine

## 2011-07-24 VITALS — BP 130/92 | HR 92 | Resp 16 | Ht 62.0 in | Wt 178.1 lb

## 2011-07-24 DIAGNOSIS — R7301 Impaired fasting glucose: Secondary | ICD-10-CM

## 2011-07-24 DIAGNOSIS — Z01419 Encounter for gynecological examination (general) (routine) without abnormal findings: Secondary | ICD-10-CM | POA: Insufficient documentation

## 2011-07-24 DIAGNOSIS — R5381 Other malaise: Secondary | ICD-10-CM

## 2011-07-24 DIAGNOSIS — Z23 Encounter for immunization: Secondary | ICD-10-CM

## 2011-07-24 DIAGNOSIS — R102 Pelvic and perineal pain unspecified side: Secondary | ICD-10-CM

## 2011-07-24 DIAGNOSIS — Z1211 Encounter for screening for malignant neoplasm of colon: Secondary | ICD-10-CM

## 2011-07-24 DIAGNOSIS — N949 Unspecified condition associated with female genital organs and menstrual cycle: Secondary | ICD-10-CM

## 2011-07-24 DIAGNOSIS — Z0000 Encounter for general adult medical examination without abnormal findings: Secondary | ICD-10-CM

## 2011-07-24 DIAGNOSIS — E669 Obesity, unspecified: Secondary | ICD-10-CM

## 2011-07-24 DIAGNOSIS — E785 Hyperlipidemia, unspecified: Secondary | ICD-10-CM

## 2011-07-24 DIAGNOSIS — I10 Essential (primary) hypertension: Secondary | ICD-10-CM

## 2011-07-24 LAB — CBC WITH DIFFERENTIAL/PLATELET
Basophils Absolute: 0 10*3/uL (ref 0.0–0.1)
Eosinophils Relative: 2 % (ref 0–5)
Lymphocytes Relative: 38 % (ref 12–46)
Lymphs Abs: 2.8 10*3/uL (ref 0.7–4.0)
MCV: 93.8 fL (ref 78.0–100.0)
Neutro Abs: 4 10*3/uL (ref 1.7–7.7)
Platelets: 448 10*3/uL — ABNORMAL HIGH (ref 150–400)
RBC: 4.66 MIL/uL (ref 3.87–5.11)
RDW: 13.5 % (ref 11.5–15.5)
WBC: 7.5 10*3/uL (ref 4.0–10.5)

## 2011-07-24 LAB — HEMOCCULT GUIAC POC 1CARD (OFFICE): Fecal Occult Blood, POC: NEGATIVE

## 2011-07-24 MED ORDER — PHENTERMINE HCL 37.5 MG PO CAPS: 37.5000 mg | ORAL_CAPSULE | ORAL | Status: DC

## 2011-07-24 NOTE — Assessment & Plan Note (Signed)
Uncontrolled , no med change at this time 

## 2011-07-24 NOTE — Progress Notes (Signed)
  Subjective:    Patient ID: [name] [name], female    DOB: December 22, 1960, 50 y.o.   MRN: [id number]  HPI The PT is here for annual exam and re-evaluation of chronic medical conditions, medication management and review of any available recent lab and radiology data.  Preventive health is updated, specifically  Cancer screening and Immunization.   . The PT denies any adverse except for continual weight gain. She has been inconsistent with exercise and dietary change.  States that approx 2 weeks ago she experienced sharp bilateral pelvic pain and wants this checked     Review of Systems See HPI Denies recent fever or chills. Denies sinus pressure, nasal congestion, ear pain or sore throat. Denies chest congestion, productive cough or wheezing. Denies chest pains, palpitations and leg swelling Denies abdominal pain, nausea, vomiting,diarrhea or constipation.   Denies dysuria, frequency, hesitancy or incontinence. Denies joint pain, swelling and limitation in mobility. Denies headaches, seizures, numbness, or tingling. Denies depression, anxiety or insomnia. Denies skin break down or rash.        Objective:   Physical Exam   Pleasant well nourished female, alert and oriented x 3, in no cardio-pulmonary distress. Afebrile. HEENT No facial trauma or asymetry. Sinuses non tender.  EOMI, PERTL, fundoscopic exam is normal, no hemorhage or exudate.  External ears normal, tympanic membranes clear. Oropharynx moist, no exudate,fair  dentition. Neck: supple, no adenopathy,JVD or thyromegaly.No bruits.  Chest: Clear to ascultation bilaterally.No crackles or wheezes. Non tender to palpation  Breast: No asymetry,no masses. No nipple discharge or inversion. No axillary or supraclavicular adenopathy  Cardiovascular system; Heart sounds normal,  S1 and  S2 ,no S3.  No murmur, or thrill. Apical beat not displaced Peripheral pulses normal.  Abdomen: Soft, non tender, no organomegaly  or masses. No bruits. Bowel sounds normal. No guarding, tenderness or rebound.  Rectal:  No mass. Guaiac negative stool.  GU: External genitalia normal. No lesions. Vaginal canal normal.No discharge. Uterus normal size, no palpable  adnexal masses, no cervical motion or adnexal tenderness.  Musculoskeletal exam: Full ROM of spine, hips , shoulders and knees. No deformity ,swelling or crepitus noted. No muscle wasting or atrophy.   Neurologic: Cranial nerves 2 to 12 intact. Power, tone ,sensation and reflexes normal throughout. No disturbance in gait. No tremor.  Skin: Intact, no ulceration, erythema , scaling or rash noted. Pigmentation normal throughout  Psych; Normal mood and affect. Judgement and concentration normal      Assessment & Plan:

## 2011-07-24 NOTE — Assessment & Plan Note (Signed)
Hyperlipidemia:Low fat diet discussed and encouraged.   

## 2011-07-24 NOTE — Assessment & Plan Note (Signed)
Deteriorated. Patient re-educated about  the importance of commitment to a  minimum of 150 minutes of exercise per week. The importance of healthy food choices with portion control discussed. Encouraged to start a food diary, count calories and to consider  joining a support group. Sample diet sheets offered. Goals set by the patient for the next several months.    

## 2011-07-24 NOTE — Patient Instructions (Addendum)
F/U in 4 months.  TdaP  Today.  Fasting labs today  You are referred for an [place] of your pelvis due to c/o pelvic pain.  You are referred for screening colonoscopy.  It is important that you exercise regularly at least 30 minutes 5 times a week. If you develop chest pain, have severe difficulty breathing, or feel very tired, stop exercising immediately and seek medical attention   A healthy diet is rich in fruit, vegetables and whole grains. Poultry fish, nuts and beans are a healthy choice for protein rather then red meat. A low sodium diet and drinking 64 ounces of water daily is generally recommended. Oils and sweet should be limited. Carbohydrates especially for those who are diabetic or overweight, should be limited to 30-45 gram per meal. It is important to eat on a regular schedule, at least 3 times daily. Snacks should be primarily fruits, vegetables or nuts.  Weight loss goal is 8 to 10 pounds

## 2011-07-24 NOTE — Assessment & Plan Note (Signed)
Recent acute episode, will refer for [place]

## 2011-07-25 LAB — BASIC METABOLIC PANEL
CO2: 29 mEq/L (ref 19–32)
Chloride: 102 mEq/L (ref 96–112)
Potassium: 4.3 mEq/L (ref 3.5–5.3)
Sodium: 140 mEq/L (ref 135–145)

## 2011-07-25 LAB — HEMOGLOBIN A1C
Hgb A1c MFr Bld: 6 % — ABNORMAL HIGH (ref <5.7)
Mean Plasma Glucose: 126 mg/dL — ABNORMAL HIGH (ref <117)

## 2011-07-25 LAB — LIPID PANEL
HDL: 63 mg/dL (ref >39)
LDL Cholesterol: 138 mg/dL — ABNORMAL HIGH (ref 0–99)
Total CHOL/HDL Ratio: 3.4 Ratio
Triglycerides: 67 mg/dL (ref <150)
VLDL: 13 mg/dL (ref 0–40)

## 2011-07-25 LAB — TSH: TSH: 1.165 u[IU]/mL (ref 0.350–4.500)

## 2011-07-26 ENCOUNTER — Encounter: Payer: Self-pay | Admitting: *Deleted

## 2011-07-26 ENCOUNTER — Telehealth: Payer: Self-pay

## 2011-07-26 NOTE — Telephone Encounter (Signed)
LMOM for pt to call. 

## 2011-07-27 NOTE — Telephone Encounter (Signed)
Returned pt's VM. LMOM for her to return call.

## 2011-07-28 ENCOUNTER — Other Ambulatory Visit (HOSPITAL_COMMUNITY): Payer: 59

## 2011-07-31 ENCOUNTER — Other Ambulatory Visit (HOSPITAL_COMMUNITY): Payer: Self-pay | Admitting: Family Medicine

## 2011-07-31 ENCOUNTER — Ambulatory Visit (HOSPITAL_COMMUNITY)
Admission: RE | Admit: 2011-07-31 | Discharge: 2011-07-31 | Disposition: A | Discharge status: Home / Self Care | Payer: 59 | Source: Ambulatory Visit | Attending: Family Medicine | Admitting: Family Medicine

## 2011-07-31 DIAGNOSIS — N949 Unspecified condition associated with female genital organs and menstrual cycle: Secondary | ICD-10-CM | POA: Insufficient documentation

## 2011-07-31 DIAGNOSIS — R102 Pelvic and perineal pain: Secondary | ICD-10-CM

## 2011-07-31 DIAGNOSIS — Z853 Personal history of malignant neoplasm of breast: Secondary | ICD-10-CM | POA: Insufficient documentation

## 2011-08-01 NOTE — Telephone Encounter (Signed)
LMOM to call.

## 2011-08-07 ENCOUNTER — Telehealth: Payer: Self-pay | Admitting: Family Medicine

## 2011-08-07 NOTE — Telephone Encounter (Signed)
Advised test was normal per dr Lodema Hong

## 2011-08-14 ENCOUNTER — Other Ambulatory Visit: Payer: Self-pay

## 2011-08-14 NOTE — Telephone Encounter (Signed)
LMOM to call and schedule colonoscopy.

## 2011-08-15 ENCOUNTER — Other Ambulatory Visit: Payer: Self-pay

## 2011-08-15 DIAGNOSIS — Z139 Encounter for screening, unspecified: Secondary | ICD-10-CM

## 2011-08-15 NOTE — Telephone Encounter (Signed)
Gastroenterology Pre-Procedure Form  Request Date: 08/15/2011         Requesting Physician: Dr. Lodema Hong     PATIENT INFORMATION:  [name] [name] is a 50 y.o., female (DOB=Mar 23, 1961).  PROCEDURE: Procedure(s) requested: colonoscopy Procedure Reason: screening for colon cancer  PATIENT REVIEW QUESTIONS: The patient reports the following:   1. Diabetes Melitis: no 2. Joint replacements in the past 12 months: no 3. Major health problems in the past 3 months: no 4. Has an artificial valve or MVP:no 5. Has been advised in past to take antibiotics in advance of a procedure like teeth cleaning: no}    MEDICATIONS & ALLERGIES:    Patient reports the following regarding taking any blood thinners:   Plavix? no Aspirin?no Coumadin?  no  Patient confirms/reports the following medications:  Current Outpatient Prescriptions  Medication Sig Dispense Refill  . Biotin 1000 MCG tablet Take 1,000 mcg by mouth. Take one tablet by mouth once a day         . hydrochlorothiazide 25 MG tablet Take 1 tablet (25 mg total) by mouth daily.  90 tablet  1  . ibuprofen (ADVIL,MOTRIN) 800 MG tablet Take 800 mg by mouth. One tablet by mouth three times daily as needed for pain        . potassium chloride SA (KLOR-CON M20) 20 MEQ tablet Take 1 tablet (20 mEq total) by mouth daily. Take one tablet by mouth once a day  90 tablet  1  . Cetirizine HCl 10 MG CAPS Take by mouth daily. Take one tablet by mouth once daily       . cholecalciferol (VITAMIN D) 1000 UNITS tablet Take 1,000 Units by mouth daily. One tablet by mouth once daily       . phentermine 37.5 MG capsule Take 1 capsule (37.5 mg total) by mouth every morning.  30 capsule  3    Patient confirms/reports the following allergies:  Allergies  Allergen Reactions  . Ace Inhibitors     REACTION: cough    Patient is appropriate to schedule for requested procedure(s): yes  AUTHORIZATION INFORMATION Primary Insurance:   ID #:  Group #:  Pre-Cert /  Auth required: Pre-Cert / Auth #:   Secondary Insurance:   ID #:   Group #:  Pre-Cert / Auth required: Pre-Cert / Auth #  No orders of the defined types were placed in this encounter.    SCHEDULE INFORMATION: Procedure has been scheduled as follows:  Date: 09/15/2011       Time: 9:15 AM  Location: [facility] Short Stay  This Gastroenterology Pre-Precedure Form is being routed to the following provider(s) for review: Jonette Eva, MD

## 2011-08-15 NOTE — Telephone Encounter (Signed)
MOVI PREP SPLIT DOSING, REGULAR BREAKFAST. CLEAR LIQUIDS AFTER 9 AM.  

## 2011-08-15 NOTE — Telephone Encounter (Signed)
Pt returned call to schedule.

## 2011-08-16 NOTE — Telephone Encounter (Signed)
RX AND INSTRUCTIONS MAILED.

## 2011-09-07 ENCOUNTER — Telehealth: Payer: Self-pay

## 2011-09-07 NOTE — Telephone Encounter (Signed)
MOVI PREP SPLIT DOSING, REGULAR BREAKFAST. CLEAR LIQUIDS AFTER 9 AM.  

## 2011-09-07 NOTE — Telephone Encounter (Signed)
Gastroenterology Pre-Procedure Form    Request Date: 09/07/2011       Requesting Physician: Dr. Lodema Hong     PATIENT INFORMATION:  [name] [name] is a 50 y.o., female (DOB=1961-02-15).  PROCEDURE: Procedure(s) requested: colonoscopy Procedure Reason: screening for colon cancer  PATIENT REVIEW QUESTIONS: The patient reports the following:   1. Diabetes Melitis: no 2. Joint replacements in the past 12 months: no 3. Major health problems in the past 3 months: no 4. Has an artificial valve or MVP:no 5. Has been advised in past to take antibiotics in advance of a procedure like teeth cleaning: no}    MEDICATIONS & ALLERGIES:    Patient reports the following regarding taking any blood thinners:   Plavix? no Aspirin?no Coumadin?  no  Patient confirms/reports the following medications:  Current Outpatient Prescriptions  Medication Sig Dispense Refill  . Biotin 1000 MCG tablet Take 1,000 mcg by mouth. Take one tablet by mouth once a day         . cholecalciferol (VITAMIN D) 1000 UNITS tablet Take 1,000 Units by mouth daily. One tablet by mouth once daily       . hydrochlorothiazide 25 MG tablet Take 1 tablet (25 mg total) by mouth daily.  90 tablet  1  . Cetirizine HCl 10 MG CAPS Take by mouth daily. Take one tablet by mouth once daily       . ibuprofen (ADVIL,MOTRIN) 800 MG tablet Take 800 mg by mouth. One tablet by mouth three times daily as needed for pain        . potassium chloride SA (KLOR-CON M20) 20 MEQ tablet Take 1 tablet (20 mEq total) by mouth daily. Take one tablet by mouth once a day  90 tablet  1    Patient confirms/reports the following allergies:  Allergies  Allergen Reactions  . Ace Inhibitors     REACTION: cough    Patient is appropriate to schedule for requested procedure(s): yes  AUTHORIZATION INFORMATION Primary Insurance:   ID #:   Group #:  Pre-Cert / Auth required: Pre-Cert / Auth #:   Secondary Insurance:   ID #:   Group #:  Pre-Cert / Auth  require: Pre-Cert / Auth #:   No orders of the defined types were placed in this encounter.    SCHEDULE INFORMATION: Procedure has been scheduled as follows:  Date: 09/15/2011       Time: 9:30 AM  Location: [facility] Short Stay  This Gastroenterology Pre-Precedure Form is being routed to the following provider(s) for review: Jonette Eva, MD

## 2011-09-11 NOTE — Telephone Encounter (Signed)
Rx and instructions faxed to  [facility] pharmacy.

## 2011-09-14 MED ORDER — SODIUM CHLORIDE 0.45 % IV SOLN
Freq: Once | INTRAVENOUS | Status: AC
  Administered 2011-09-15: 09:00:00 via INTRAVENOUS

## 2011-09-15 ENCOUNTER — Ambulatory Visit (HOSPITAL_COMMUNITY)
Admission: RE | Admit: 2011-09-15 | Discharge: 2011-09-15 | Disposition: A | Discharge status: Home / Self Care | Payer: 59 | Source: Ambulatory Visit | Attending: Gastroenterology | Admitting: Gastroenterology

## 2011-09-15 ENCOUNTER — Other Ambulatory Visit: Payer: Self-pay | Admitting: Gastroenterology

## 2011-09-15 ENCOUNTER — Encounter (HOSPITAL_COMMUNITY): Payer: Self-pay

## 2011-09-15 ENCOUNTER — Encounter (HOSPITAL_COMMUNITY)
Admission: RE | Disposition: A | Discharge status: Home / Self Care | Payer: Self-pay | Source: Ambulatory Visit | Attending: Gastroenterology

## 2011-09-15 DIAGNOSIS — Z1211 Encounter for screening for malignant neoplasm of colon: Secondary | ICD-10-CM

## 2011-09-15 DIAGNOSIS — K5730 Diverticulosis of large intestine without perforation or abscess without bleeding: Secondary | ICD-10-CM

## 2011-09-15 DIAGNOSIS — K648 Other hemorrhoids: Secondary | ICD-10-CM | POA: Insufficient documentation

## 2011-09-15 DIAGNOSIS — I10 Essential (primary) hypertension: Secondary | ICD-10-CM | POA: Insufficient documentation

## 2011-09-15 DIAGNOSIS — D126 Benign neoplasm of colon, unspecified: Secondary | ICD-10-CM | POA: Insufficient documentation

## 2011-09-15 DIAGNOSIS — Z139 Encounter for screening, unspecified: Secondary | ICD-10-CM

## 2011-09-15 DIAGNOSIS — E785 Hyperlipidemia, unspecified: Secondary | ICD-10-CM | POA: Insufficient documentation

## 2011-09-15 HISTORY — PX: COLONOSCOPY: SHX5424

## 2011-09-15 SURGERY — COLONOSCOPY
Anesthesia: Moderate Sedation

## 2011-09-15 MED ORDER — MIDAZOLAM HCL 5 MG/5ML IJ SOLN
INTRAMUSCULAR | Status: AC
  Filled 2011-09-15: qty 10

## 2011-09-15 MED ORDER — MEPERIDINE HCL 100 MG/ML IJ SOLN
INTRAMUSCULAR | Status: AC
  Filled 2011-09-15: qty 1

## 2011-09-15 MED ORDER — MEPERIDINE HCL 100 MG/ML IJ SOLN
INTRAMUSCULAR | Status: DC | PRN
  Administered 2011-09-15 (×2): 25 mg via INTRAVENOUS

## 2011-09-15 MED ORDER — MIDAZOLAM HCL 5 MG/5ML IJ SOLN
INTRAMUSCULAR | Status: DC | PRN
  Administered 2011-09-15 (×2): 2 mg via INTRAVENOUS

## 2011-09-15 NOTE — H&P (Signed)
Primary Care Physician:  Syliva Overman, MD, MD Primary Gastroenterologist:  Dr. Darrick Penna  Pre-Procedure History & Physical: HPI:  [name] [name] is a 50 y.o. female here for COLON CANCER SCREENING.  Past Medical History  Diagnosis Date  . Hyperlipidemia   . Adenocarcinoma of breast     left   . Obesity   . Heel spur   . Hypertension     Past Surgical History  Procedure Date  . Btl 1992  . Cholecystectomy   . Left lumpectomy and lymph node disection 2000  . R heel surgery for spur and chipped bone 11/09/20099  . Tubal ligation     Prior to Admission medications   Medication Sig Start Date End Date Taking? Authorizing Provider  Biotin 1000 MCG tablet Take 1,000 mcg by mouth. Take one tablet by mouth once a day      Yes Historical Provider, MD  cholecalciferol (VITAMIN D) 1000 UNITS tablet Take 1,000 Units by mouth daily. One tablet by mouth once daily    Yes Historical Provider, MD  hydrochlorothiazide 25 MG tablet Take 1 tablet (25 mg total) by mouth daily. 03/20/11  Yes Syliva Overman, MD  potassium chloride SA (KLOR-CON M20) 20 MEQ tablet Take 1 tablet (20 mEq total) by mouth daily. Take one tablet by mouth once a day 01/10/11  Yes Syliva Overman, MD  Cetirizine HCl 10 MG CAPS Take by mouth daily. Take one tablet by mouth once daily     Historical Provider, MD  ibuprofen (ADVIL,MOTRIN) 800 MG tablet Take 800 mg by mouth. One tablet by mouth three times daily as needed for pain      Historical Provider, MD    Allergies as of 08/15/2011 - Review Complete 08/15/2011  Allergen Reaction Noted  . Ace inhibitors      Family History  Problem Relation Age of Onset  . Heart attack Mother   . Heart attack Father   . Glaucoma Father   . Hypertension Father   . Hypertension Sister   . Diabetes Sister   . Anesthesia problems Neg Hx   . Hypotension Neg Hx   . Malignant hyperthermia Neg Hx   . Pseudochol deficiency Neg Hx   NO COLON CA OR POLYPS  History   Social  History  . Marital Status: Married    Spouse Name: N/A    Number of Children: N/A  . Years of Education: N/A   Occupational History  . Not on file.   Social History Main Topics  . Smoking status: Current Everyday Smoker -- 0.2 packs/day for 15 years    Types: Cigarettes  . Smokeless tobacco: Not on file  . Alcohol Use: No  . Drug Use: No  . Sexually Active: Yes    Birth Control/ Protection: Post-menopausal   Other Topics Concern  . Not on file   Social History Narrative  . No narrative on file    Review of Systems: See HPI, otherwise negative ROS   Physical Exam: BP 124/82  Pulse 86  Temp(Src) 98.5 F (36.9 C) (Oral)  Resp 12  Ht 5\' 2"  (1.575 m)  Wt 178 lb (80.74 kg)  BMI 32.56 kg/m2  SpO2 100% General:   Alert,  pleasant and cooperative in NAD Head:  Normocephalic and atraumatic. Neck:  Supple; Lungs:  Clear throughout to auscultation.    Heart:  Regular rate and rhythm. Abdomen:  Soft, nontender and nondistended. Normal bowel sounds, without guarding, and without rebound.   Neurologic:  Alert and  oriented x4;  grossly normal neurologically.  Impression/Plan:     AVERAGE RISK  PLAN: TCS TODAY

## 2011-09-15 NOTE — Op Note (Addendum)
[facility] [address] [place], [place]  [address]  COLONOSCOPY PROCEDURE REPORT  PATIENT:  [name] [name], [name] [name]  MR#:  [id number] BIRTHDATE:  [birth year], 50 yrs. old  GENDER:  female  ENDOSCOPIST:  Jonette Eva, MD REF. BY:  Syliva Overman, M.D. ASSISTANT:  PROCEDURE DATE:  09/15/2011 PROCEDURE:  Colonoscopy with biopsy  INDICATIONS:  SCREENING  MEDICATIONS:   Demerol 50 mg IV, Versed 4 mg IV  DESCRIPTION OF PROCEDURE:    Physical exam was performed. Informed consent was obtained from the patient after explaining the benefits, risks, and alternatives to procedure.  The patient was connected to monitor and placed in left lateral position. Continuous oxygen was provided by nasal cannula and IV medicine administered through an indwelling cannula.  After administration of sedation and rectal exam, the patient's rectum was intubated and the EC-[id number] ([id number]) colonoscope was advanced under direct visualization to the cecum.  The scope was removed slowly by carefully examining the color, texture, anatomy, and integrity mucosa on the way out.  The patient was recovered in endoscopy and discharged home in satisfactory condition. <<PROCEDUREIMAGES>>  FINDINGS:  There were FOUR polyps identified IN THE CECUM  DESCENDIG/SIGMOID COLON (3-4MM) and removed VIA COLD FORCEPS. FREQUENT Diverticula were found in the sigmoid to descending colon segments.  SMALL Internal Hemorrhoids were found.  PREP QUALITY: GOOD-PARTICULATE MATTER IN THE LEFT OLON HA TO BE ASPIRATED AND WASHED AWAY, WHICH PROLONGED THE ROCEDURE TIME.  CECAL W/D TIME:    23 minutes  COMPLICATIONS:    None  ENDOSCOPIC IMPRESSION: 1) Polyps, multiple 2) Diverticulosis in the sigmoid to descending colon segments 3) Internal hemorrhoids  RECOMMENDATIONS: TCS IN 10 YEARS WITH CLEAR LIQUID FOR 24 HOURS PRIOR TO PROCEDURE HIGH FIBER DIET  REPEAT EXAM:  No  ______________________________ Jonette Eva,  MD  CC:  Syliva Overman, M.D.  n. REVISED:  09/15/2011 10:19 AM eSIGNED:   Duncan Dull Deangela Randleman at 09/15/2011 10:19 AM  Bertram Millard, [phone]

## 2011-09-21 ENCOUNTER — Telehealth: Payer: Self-pay | Admitting: Gastroenterology

## 2011-09-21 NOTE — Telephone Encounter (Signed)
Pt called wanting results from [facility]- please call on her cell

## 2011-09-21 NOTE — Telephone Encounter (Signed)
Spoke to pt. Dr. Darrick Penna has not signed off yet, but looks like no cancer, and when it is signed off on, I will give her a call.

## 2011-09-22 ENCOUNTER — Other Ambulatory Visit: Payer: Self-pay | Admitting: Family Medicine

## 2011-09-25 NOTE — Telephone Encounter (Signed)
Please call pt. She had simple adenomas removed from her colon. TCS in 10 years. High fiber diet.  

## 2011-09-27 NOTE — Telephone Encounter (Signed)
Results Cc to PCP  

## 2011-09-27 NOTE — Telephone Encounter (Signed)
Pt was informed.

## 2011-09-28 ENCOUNTER — Encounter (HOSPITAL_COMMUNITY): Payer: Self-pay | Admitting: Gastroenterology

## 2011-10-19 ENCOUNTER — Other Ambulatory Visit: Payer: Self-pay | Admitting: Family Medicine

## 2011-12-06 ENCOUNTER — Encounter: Payer: Self-pay | Admitting: Family Medicine

## 2011-12-06 ENCOUNTER — Ambulatory Visit (INDEPENDENT_AMBULATORY_CARE_PROVIDER_SITE_OTHER): Payer: 59 | Admitting: Family Medicine

## 2011-12-06 VITALS — BP 118/74 | HR 89 | Resp 16 | Ht 62.0 in | Wt 187.0 lb

## 2011-12-06 DIAGNOSIS — I10 Essential (primary) hypertension: Secondary | ICD-10-CM

## 2011-12-06 DIAGNOSIS — E669 Obesity, unspecified: Secondary | ICD-10-CM

## 2011-12-06 DIAGNOSIS — E785 Hyperlipidemia, unspecified: Secondary | ICD-10-CM

## 2011-12-06 DIAGNOSIS — J309 Allergic rhinitis, unspecified: Secondary | ICD-10-CM

## 2011-12-06 DIAGNOSIS — J209 Acute bronchitis, unspecified: Secondary | ICD-10-CM

## 2011-12-06 DIAGNOSIS — J0100 Acute maxillary sinusitis, unspecified: Secondary | ICD-10-CM

## 2011-12-06 DIAGNOSIS — R7301 Impaired fasting glucose: Secondary | ICD-10-CM

## 2011-12-06 LAB — HEMOGLOBIN A1C
Hgb A1c MFr Bld: 5.5 % (ref <5.7)
Mean Plasma Glucose: 111 mg/dL (ref <117)

## 2011-12-06 MED ORDER — BENZONATATE 100 MG PO CAPS: 100.0000 mg | ORAL_CAPSULE | Freq: Four times a day (QID) | ORAL | Status: DC | PRN

## 2011-12-06 MED ORDER — PENICILLIN V POTASSIUM 500 MG PO TABS: 500.0000 mg | ORAL_TABLET | Freq: Three times a day (TID) | ORAL | Status: DC

## 2011-12-06 NOTE — Patient Instructions (Addendum)
Cancel appt  Next week.  F/U in 4 month  You are being treated for acute right maxillary sinusitis and bronchitis, meds are sent to [facility] pharmacy, please call and let them know you will collect them there today  Work excuse for today  HBA1C today.  Hba1C , fasting lipid and cmp in 4 month  Please follow a low fat carbohydrate restricted diet, your cholesterol is high, and you are pre diabetic  Pls call and schedule to go to a group session on diabetes, we will give you information about this.  Weight loss goal of 2 to 3 pounds per month

## 2011-12-10 ENCOUNTER — Encounter: Payer: Self-pay | Admitting: Family Medicine

## 2011-12-10 NOTE — Assessment & Plan Note (Signed)
Controlled, no change in medication  

## 2011-12-10 NOTE — Assessment & Plan Note (Signed)
Decongestant and antibiotic prescribed 

## 2011-12-10 NOTE — Assessment & Plan Note (Signed)
Uncontrolled symptoms, regular use of medication stressed

## 2011-12-10 NOTE — Assessment & Plan Note (Signed)
Acute infection, ork excuse and medication prescribed

## 2011-12-10 NOTE — Assessment & Plan Note (Signed)
Deteriorated. Patient re-educated about  the importance of commitment to a  minimum of 150 minutes of exercise per week. The importance of healthy food choices with portion control discussed. Encouraged to start a food diary, count calories and to consider  joining a support group. Sample diet sheets offered. Goals set by the patient for the next several months.    

## 2011-12-10 NOTE — Progress Notes (Signed)
  Subjective:    Patient ID: [name] [name], female    DOB: 10/19/1960, 51 y.o.   MRN: [id number]  HPI Head and chest congestion with sinus pressure over cheek, yellow nasal and post nasal drainage and cough productive of yellow sputum. She has also had intermittent chills, denies fever.Fatigue, unable to work as a result and requests excuse Pt also concerned about weight gain, wants to resume phentermine and will work more consistently on lifestyle change to promote weight loss. Often feels bloated.   Review of Systems See HPI  Denies chest pains, palpitations and leg swelling Denies abdominal pain, nausea, vomiting,diarrhea or constipation.   Denies dysuria, frequency, hesitancy or incontinence. Denies joint pain, swelling and limitation in mobility. Denies headaches, seizures, numbness, or tingling. Denies depression, anxiety or insomnia. Denies skin break down or rash.        Objective:   Physical Exam Patient alert and oriented and in no cardiopulmonary distress.  HEENT: No facial asymmetry, EOMI,maxillary  sinus tenderness,  oropharynx pink and moist.  Neck supple anterior cervical  adenopathy.  Chest: decreased air entry with crackles  CVS: S1, S2 no murmurs, no S3.  ABD: Soft non tender. Bowel sounds normal.  Ext: No edema  MS: Adequate ROM spine, shoulders, hips and knees.  Skin: Intact, no ulcerations or rash noted.  Psych: Good eye contact, normal affect. Memory intact not anxious or depressed appearing.  CNS: CN 2-12 intact, power, tone and sensation normal throughout.        Assessment & Plan:

## 2011-12-11 ENCOUNTER — Ambulatory Visit: Payer: 59 | Admitting: Family Medicine

## 2011-12-25 ENCOUNTER — Ambulatory Visit (INDEPENDENT_AMBULATORY_CARE_PROVIDER_SITE_OTHER): Payer: 59 | Admitting: Family Medicine

## 2011-12-25 ENCOUNTER — Telehealth: Payer: Self-pay | Admitting: Family Medicine

## 2011-12-25 ENCOUNTER — Encounter: Payer: Self-pay | Admitting: Family Medicine

## 2011-12-25 VITALS — BP 122/76 | HR 90 | Resp 18 | Ht 62.0 in | Wt 188.1 lb

## 2011-12-25 DIAGNOSIS — N3000 Acute cystitis without hematuria: Secondary | ICD-10-CM

## 2011-12-25 DIAGNOSIS — I10 Essential (primary) hypertension: Secondary | ICD-10-CM

## 2011-12-25 DIAGNOSIS — R319 Hematuria, unspecified: Secondary | ICD-10-CM | POA: Insufficient documentation

## 2011-12-25 LAB — POCT URINALYSIS DIPSTICK
Glucose, UA: NEGATIVE
Ketones, UA: NEGATIVE
Leukocytes, UA: NEGATIVE
Protein, UA: NEGATIVE

## 2011-12-25 NOTE — Assessment & Plan Note (Signed)
Symptomatic with questionable urine. Send to lab fo CCUA and c/s also refer to urology. Pelvic US in NOv had normal endometrium

## 2011-12-25 NOTE — Assessment & Plan Note (Signed)
Controlled, no change in medication  

## 2011-12-25 NOTE — Progress Notes (Signed)
  Subjective:    Patient ID: [name] [name], female    DOB: Nov 07, 1960, 51 y.o.   MRN: [id number]  HPI 2 day h/o dark blood with urination,no other symptoms suggestive of uTI. No kidney stone history, teice since last night.Denies bloody d/c on underwear, just seen with voiding urine Post menopausal x 7 years, had normal pelvic US in 07/2011   Review of Systems See HPI Denies recent fever or chills. Denies sinus pressure, nasal congestion, ear pain or sore throat. Denies chest congestion, productive cough or wheezing. Denies chest pains, palpitations and leg swelling Denies abdominal pain, nausea, vomiting,diarrhea or constipation.   Denies dysuria, frequency, hesitancy or incontinence. Denies joint pain, swelling and limitation in mobility. Denies headaches, seizures, numbness, or tingling. Denies depression, anxiety or insomnia. Denies skin break down or rash.        Objective:   Physical Exam Patient alert and oriented and in no cardiopulmonary distress.  HEENT: No facial asymmetry, EOMI, no sinus tenderness,  oropharynx pink and moist.  Neck supple no adenopathy.  Chest: Clear to auscultation bilaterally.  CVS: S1, S2 no murmurs, no S3.  ABD: Soft non tender. Bowel sounds normal.  Ext: No edema  MS: Adequate ROM spine, shoulders, hips and knees.  Skin: Intact, no ulcerations or rash noted.  Psych: Good eye contact, normal affect. Memory intact not anxious or depressed appearing.  CNS: CN 2-12 intact, power, tone and sensation normal throughout.        Assessment & Plan:

## 2011-12-25 NOTE — Telephone Encounter (Signed)
Will forward to referral staff  

## 2011-12-25 NOTE — Telephone Encounter (Signed)
Just started seeing blood in her urine yesterday. No burning on urination, just seeing blood when she wipes. Office visit per Dr Lodema Hong

## 2011-12-25 NOTE — Patient Instructions (Signed)
F/u as before.  Your urine will be sent for further testing, and you will be referred to urology for further evaluation

## 2011-12-26 LAB — URINALYSIS
Bilirubin Urine: NEGATIVE
Glucose, UA: NEGATIVE mg/dL
Ketones, ur: NEGATIVE mg/dL
Specific Gravity, Urine: 1.022 (ref 1.005–1.030)
Urobilinogen, UA: 0.2 mg/dL (ref 0.0–1.0)
pH: 7.5 (ref 5.0–8.0)

## 2011-12-27 LAB — URINE CULTURE

## 2012-01-29 ENCOUNTER — Encounter (HOSPITAL_COMMUNITY): Payer: 59 | Admitting: Oncology

## 2012-01-29 ENCOUNTER — Encounter (HOSPITAL_COMMUNITY): Payer: Self-pay | Admitting: Oncology

## 2012-01-29 ENCOUNTER — Encounter (HOSPITAL_COMMUNITY): Payer: 59 | Attending: Oncology | Admitting: Oncology

## 2012-01-29 VITALS — BP 119/85 | HR 95 | Temp 97.8°F | Wt 190.2 lb

## 2012-01-29 DIAGNOSIS — F17200 Nicotine dependence, unspecified, uncomplicated: Secondary | ICD-10-CM | POA: Insufficient documentation

## 2012-01-29 DIAGNOSIS — C50919 Malignant neoplasm of unspecified site of unspecified female breast: Secondary | ICD-10-CM

## 2012-01-29 DIAGNOSIS — Z853 Personal history of malignant neoplasm of breast: Secondary | ICD-10-CM | POA: Insufficient documentation

## 2012-01-29 DIAGNOSIS — R319 Hematuria, unspecified: Secondary | ICD-10-CM

## 2012-01-29 DIAGNOSIS — E669 Obesity, unspecified: Secondary | ICD-10-CM | POA: Insufficient documentation

## 2012-01-29 DIAGNOSIS — Z09 Encounter for follow-up examination after completed treatment for conditions other than malignant neoplasm: Secondary | ICD-10-CM | POA: Insufficient documentation

## 2012-01-29 DIAGNOSIS — I10 Essential (primary) hypertension: Secondary | ICD-10-CM | POA: Insufficient documentation

## 2012-01-29 LAB — COMPREHENSIVE METABOLIC PANEL
ALT: 14 U/L (ref 0–35)
AST: 17 U/L (ref 0–37)
CO2: 30 mEq/L (ref 19–32)
Chloride: 101 mEq/L (ref 96–112)
GFR calc non Af Amer: >90 mL/min (ref >90)
Sodium: 141 mEq/L (ref 135–145)
Total Bilirubin: 0.2 mg/dL — ABNORMAL LOW (ref 0.3–1.2)

## 2012-01-29 LAB — CBC
HCT: 43 % (ref 36.0–46.0)
Platelets: 408 10*3/uL — ABNORMAL HIGH (ref 150–400)
RBC: 4.6 MIL/uL (ref 3.87–5.11)
RDW: 13.4 % (ref 11.5–15.5)
WBC: 7.7 10*3/uL (ref 4.0–10.5)

## 2012-01-29 LAB — DIFFERENTIAL
Basophils Absolute: 0 10*3/uL (ref 0.0–0.1)
Lymphocytes Relative: 39 % (ref 12–46)
Lymphs Abs: 3 10*3/uL (ref 0.7–4.0)
Monocytes Absolute: 0.5 10*3/uL (ref 0.1–1.0)
Neutro Abs: 3.9 10*3/uL (ref 1.7–7.7)

## 2012-01-29 NOTE — Progress Notes (Signed)
This office note has been dictated.

## 2012-01-29 NOTE — Progress Notes (Signed)
CC:   [name] [name]. Lodema Hong, M.D. Oneita Hurt, M.D.  DIAGNOSES: 1. 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  She had ER receptors that were negative, PR     receptors that were negative, HER-2 was 3+ over-expressed, Ki-67     marker was low at 7%, however.  She took AC x4 cycles, followed by     4 cycles of Navelbine.  These were dose-dense treatments with the     Taxotere and Navelbine.  The Adriamycin and Cytoxan were also dose-     dense.  She also took Herceptin for a year.  She has no evidence of     recurrence. 2. Hypertension. 3. Obesity, still weighing 190 pounds.  She is down from her peak of     209 pounds.  That was in August 2006. [name] [name] has no family history and she was 34 when she was diagnosed with this breast cancer.  I talked to her today about the genetics counseling since she has 2 biological children, a son and a daughter, and she raised a 3rd child.  There is again no family history, cousins, grandparents, etc. that she is aware of.  She has no family history of ovarian cancer, colon cancer, or uterine cancer either.  So I have offered her a genetics consultation since she was so young.  Other than this, she has done well.  Her husband recently had a heart attack and had to have a stent placed.  It sounds like he nearly died, had to be shocked twice in [place] about 4-6 weeks ago.  She is still working herself, however.  She still occasionally smokes a cigarette or 2.  Unfortunately her husband is still smoking.  So we went over the fact that she needs to quit unequivocally and she still needs to exercise and work on her weight.  REVIEW OF SYSTEMS:  Negative.  PHYSICAL EXAMINATION:  Vital Signs:  Essentially are stable.  Lymph Nodes:  Negative throughout.  Breasts:  The left breast is slightly smaller, pigmented, but without masses.  The  right breast is negative for masses.  Her Port-A-Cath is out.  Lungs:  Clear.  Heart:  Shows a regular rhythm and rate without murmur, rub, or gallop.  Abdomen:  Soft, nontender, without organomegaly or masses.  Extremities:  She has no leg edema or arm edema.  So she looks great.  I will see her in a year.  We will get a CBC, diff, and CMET today.  The reason we are going to do that is because about 10- 14 days ago, she started to have a little blood every time she wiped herself when she urinated, so appropriately Dr. Lodema Hong has sent her to Dr. Vernie Ammons for evaluation.  She has had a CT scan today, but the results are not ready yet.  So we ask her just to have her doctor send [place] a consultation note at the completion of the workup.  She has not had a pelvic exam yet, but is going to have 1 on Monday and is getting a CT scan today, as I mentioned, has already had it and we will find out what has transpired.   ______________________________ [name]. Mariel Sleet, MD ESN/MEDQ  D:  01/29/2012  T:  01/29/2012  Job:  [id number]

## 2012-01-29 NOTE — Patient Instructions (Signed)
LEWANNA PETRAK  [id number] 04/24/1961 Dr. Glenford Peers   [facility]  Discharge Instructions  RECOMMENDATIONS MADE BY THE CONSULTANT AND ANY TEST RESULTS WILL BE SENT TO YOUR REFERRING DOCTOR.   EXAM FINDINGS BY MD TODAY AND SIGNS AND SYMPTOMS TO REPORT TO CLINIC OR PRIMARY MD: Exam and discussion per MD.  Report any new lumps, bone pain or shortness of breath.  MEDICATIONS PRESCRIBED: none      SPECIAL INSTRUCTIONS/FOLLOW-UP: Return to Clinic in 1 year.   I acknowledge that I have been informed and understand all the instructions given to me and received a copy. I do not have any more questions at this time, but understand that I may call the Specialty Clinic at [facility] at [phone] during business hours should I have any further questions or need assistance in obtaining follow-up care.    __________________________________________  _____________  __________ Signature of Patient or Authorized Representative            Date                   Time    __________________________________________ Nurse's Signature

## 2012-04-05 ENCOUNTER — Other Ambulatory Visit: Payer: Self-pay | Admitting: Family Medicine

## 2012-04-05 DIAGNOSIS — Z9889 Other specified postprocedural states: Secondary | ICD-10-CM

## 2012-04-08 ENCOUNTER — Other Ambulatory Visit: Payer: Self-pay | Admitting: Family Medicine

## 2012-04-08 ENCOUNTER — Encounter: Payer: Self-pay | Admitting: Family Medicine

## 2012-04-08 ENCOUNTER — Ambulatory Visit (INDEPENDENT_AMBULATORY_CARE_PROVIDER_SITE_OTHER): Payer: 59 | Admitting: Family Medicine

## 2012-04-08 VITALS — BP 134/90 | HR 91 | Resp 16 | Ht 62.0 in | Wt 191.0 lb

## 2012-04-08 DIAGNOSIS — R7309 Other abnormal glucose: Secondary | ICD-10-CM

## 2012-04-08 DIAGNOSIS — E785 Hyperlipidemia, unspecified: Secondary | ICD-10-CM

## 2012-04-08 DIAGNOSIS — R7303 Prediabetes: Secondary | ICD-10-CM | POA: Insufficient documentation

## 2012-04-08 DIAGNOSIS — J309 Allergic rhinitis, unspecified: Secondary | ICD-10-CM

## 2012-04-08 DIAGNOSIS — R7301 Impaired fasting glucose: Secondary | ICD-10-CM

## 2012-04-08 DIAGNOSIS — E669 Obesity, unspecified: Secondary | ICD-10-CM

## 2012-04-08 DIAGNOSIS — C50919 Malignant neoplasm of unspecified site of unspecified female breast: Secondary | ICD-10-CM

## 2012-04-08 DIAGNOSIS — I10 Essential (primary) hypertension: Secondary | ICD-10-CM

## 2012-04-08 MED ORDER — PHENTERMINE HCL 37.5 MG PO TABS
37.5000 mg | ORAL_TABLET | Freq: Every day | ORAL | Status: DC
Start: 2012-04-08 — End: 2012-08-12

## 2012-04-08 NOTE — Assessment & Plan Note (Signed)
Deteriorated. Patient re-educated about  the importance of commitment to a  minimum of 150 minutes of exercise per week. The importance of healthy food choices with portion control discussed. Encouraged to start a food diary, count calories and to consider  joining a support group. Sample diet sheets offered. Goals set by the patient for the next several months.    

## 2012-04-08 NOTE — Assessment & Plan Note (Signed)
Uncontrolled, no med change, lifestyle change only

## 2012-04-08 NOTE — Assessment & Plan Note (Signed)
Weight loss through lifestyler change encouraged and stressed to prevent diabetes

## 2012-04-08 NOTE — Assessment & Plan Note (Signed)
Mammogram due at this  time

## 2012-04-08 NOTE — Progress Notes (Signed)
  Subjective:    Patient ID: [name] [name], female    DOB: [birth year], 51 y.o.   MRN: [id number]  HPI The PT is here for follow up and re-evaluation of chronic medical conditions, medication management and review of any available recent lab and radiology data.  Preventive health is updated, specifically  Cancer screening and Immunization.   Questions or concerns regarding consultations or procedures which the PT has had in the interim are  addressed. The PT denies any adverse reactions to current medications since the last visit.  Concerned about increased work stress, weight gain and uncontrolled allergies. Willing to change lifestyle to reverse this     Review of Systems See HPI Denies recent fever or chills. Denies sinus pressure,  or sore throat.c/o nasal congestion and slight ear fullness and discomfort  Denies chest congestion, productive cough or wheezing. Denies chest pains, palpitations and leg swelling Denies abdominal pain, nausea, vomiting,diarrhea or constipation.   Denies dysuria, frequency, hesitancy or incontinence. Denies joint pain, swelling and limitation in mobility. Denies headaches, seizures, numbness, or tingling. Denies depression, anxiety or insomnia. Denies skin break down or rash.        Objective:   Physical Exam  Patient alert and oriented and in no cardiopulmonary distress.  HEENT: No facial asymmetry, EOMI, no sinus tenderness,  oropharynx pink and moist.  Neck supple no adenopathy.Nasal mucosa erythematous and edematous  Chest: Clear to auscultation bilaterally.  CVS: S1, S2 no murmurs, no S3.  ABD: Soft non tender. Bowel sounds normal.  Ext: No edema  MS: Adequate ROM spine, shoulders, hips and knees.  Skin: Intact, no ulcerations or rash noted.  Psych: Good eye contact, normal affect. Memory intact not anxious or depressed appearing.  CNS: CN 2-12 intact, power, tone and sensation normal throughout.       Assessment & Plan:

## 2012-04-08 NOTE — Patient Instructions (Signed)
CPE November, pls call  If you need me before PLEASE have HBa1C before next visit.  It is important that you exercise regularly at least 30 minutes 5 times a week. If you develop chest pain, have severe difficulty breathing, or feel very tired, stop exercising immediately and seek medical attention   A healthy diet is rich in fruit, vegetables and whole grains. Poultry fish, nuts and beans are a healthy choice for protein rather then red meat. A low sodium diet and drinking 64 ounces of water daily is generally recommended. Oils and sweet should be limited. Carbohydrates especially for those who are diabetic or overweight, should be limited to 34-45 gram per meal. It is important to eat on a regular schedule, at least 3 times daily. Snacks should be primarily fruits, vegetables or nuts.   Weight loss goal of 3 pounds per month  PLEASE call in for your zrtec , you can also call in for sudafed.

## 2012-04-08 NOTE — Assessment & Plan Note (Signed)
Hyperlipidemia:Low fat diet discussed and encouraged.  Currently on no medication, dietary management only at this time

## 2012-04-08 NOTE — Assessment & Plan Note (Signed)
Uncontrolled, pt to call in for medications including sudafed

## 2012-04-09 LAB — COMPLETE METABOLIC PANEL WITH GFR
AST: 14 U/L (ref 0–37)
Alkaline Phosphatase: 58 U/L (ref 39–117)
BUN: 11 mg/dL (ref 6–23)
Calcium: 10.2 mg/dL (ref 8.4–10.5)
Creat: 0.61 mg/dL (ref 0.50–1.10)
Total Bilirubin: 0.4 mg/dL (ref 0.3–1.2)

## 2012-04-09 LAB — LIPID PANEL
Cholesterol: 176 mg/dL (ref 0–200)
Total CHOL/HDL Ratio: 3.1 Ratio
Triglycerides: 79 mg/dL (ref <150)
VLDL: 16 mg/dL (ref 0–40)

## 2012-04-10 ENCOUNTER — Ambulatory Visit (HOSPITAL_COMMUNITY)
Admission: RE | Admit: 2012-04-10 | Discharge: 2012-04-10 | Disposition: A | Discharge status: Home / Self Care | Payer: 59 | Source: Ambulatory Visit | Attending: Family Medicine | Admitting: Family Medicine

## 2012-04-10 DIAGNOSIS — Z853 Personal history of malignant neoplasm of breast: Secondary | ICD-10-CM | POA: Insufficient documentation

## 2012-04-10 DIAGNOSIS — Z9889 Other specified postprocedural states: Secondary | ICD-10-CM

## 2012-04-22 ENCOUNTER — Other Ambulatory Visit: Payer: Self-pay

## 2012-04-22 MED ORDER — POTASSIUM CHLORIDE CRYS ER 20 MEQ PO TBCR: 20.0000 meq | EXTENDED_RELEASE_TABLET | Freq: Every day | ORAL | Status: DC

## 2012-08-12 ENCOUNTER — Ambulatory Visit (INDEPENDENT_AMBULATORY_CARE_PROVIDER_SITE_OTHER): Payer: 59 | Admitting: Family Medicine

## 2012-08-12 ENCOUNTER — Other Ambulatory Visit (HOSPITAL_COMMUNITY)
Admission: RE | Admit: 2012-08-12 | Discharge: 2012-08-12 | Disposition: A | Discharge status: Home / Self Care | Payer: 59 | Source: Ambulatory Visit | Attending: Family Medicine | Admitting: Family Medicine

## 2012-08-12 ENCOUNTER — Encounter: Payer: Self-pay | Admitting: Family Medicine

## 2012-08-12 VITALS — BP 124/84 | HR 91 | Resp 15 | Ht 62.0 in | Wt 191.1 lb

## 2012-08-12 DIAGNOSIS — I10 Essential (primary) hypertension: Secondary | ICD-10-CM

## 2012-08-12 DIAGNOSIS — E559 Vitamin D deficiency, unspecified: Secondary | ICD-10-CM

## 2012-08-12 DIAGNOSIS — E785 Hyperlipidemia, unspecified: Secondary | ICD-10-CM

## 2012-08-12 DIAGNOSIS — R5383 Other fatigue: Secondary | ICD-10-CM

## 2012-08-12 DIAGNOSIS — Z0000 Encounter for general adult medical examination without abnormal findings: Secondary | ICD-10-CM

## 2012-08-12 DIAGNOSIS — Z1151 Encounter for screening for human papillomavirus (HPV): Secondary | ICD-10-CM | POA: Insufficient documentation

## 2012-08-12 DIAGNOSIS — Z124 Encounter for screening for malignant neoplasm of cervix: Secondary | ICD-10-CM

## 2012-08-12 DIAGNOSIS — R5381 Other malaise: Secondary | ICD-10-CM

## 2012-08-12 DIAGNOSIS — Z1211 Encounter for screening for malignant neoplasm of colon: Secondary | ICD-10-CM

## 2012-08-12 DIAGNOSIS — E669 Obesity, unspecified: Secondary | ICD-10-CM

## 2012-08-12 DIAGNOSIS — R7309 Other abnormal glucose: Secondary | ICD-10-CM

## 2012-08-12 DIAGNOSIS — R7303 Prediabetes: Secondary | ICD-10-CM

## 2012-08-12 DIAGNOSIS — Z01419 Encounter for gynecological examination (general) (routine) without abnormal findings: Secondary | ICD-10-CM | POA: Insufficient documentation

## 2012-08-12 LAB — BASIC METABOLIC PANEL
Chloride: 104 mEq/L (ref 96–112)
Potassium: 4 mEq/L (ref 3.5–5.3)

## 2012-08-12 LAB — POC HEMOCCULT BLD/STL (OFFICE/1-CARD/DIAGNOSTIC): Fecal Occult Blood, POC: NEGATIVE

## 2012-08-12 LAB — TSH: TSH: 0.918 u[IU]/mL (ref 0.350–4.500)

## 2012-08-12 LAB — HEMOGLOBIN A1C
Hgb A1c MFr Bld: 5.8 % — ABNORMAL HIGH
Mean Plasma Glucose: 120 mg/dL — ABNORMAL HIGH

## 2012-08-12 MED ORDER — ERGOCALCIFEROL 1.25 MG (50000 UT) PO CAPS: 50000.0000 [IU] | ORAL_CAPSULE | ORAL | Status: DC

## 2012-08-12 MED ORDER — CALCIUM-VITAMIN D 500-200 MG-UNIT PO TABS: 1.0000 | ORAL_TABLET | Freq: Two times a day (BID) | ORAL | Status: DC

## 2012-08-12 NOTE — Patient Instructions (Addendum)
F/U in 4 month, call if you need me before  It is important that you exercise regularly at least 30 minutes 5 times a week. If you develop chest pain, have severe difficulty breathing, or feel very tired, stop exercising immediately and seek medical attention    A healthy diet is rich in fruit, vegetables and whole grains. Poultry fish, nuts and beans are a healthy choice for protein rather then red meat. A low sodium diet and drinking 64 ounces of water daily is generally recommended. Oils and sweet should be limited. Carbohydrates especially for those who are diabetic or overweight, should be limited to 30-45 gram per meal. It is important to eat on a regular schedule, at least 3 times daily. Snacks should be primarily fruits, vegetables or nuts.  Labs today, tSH, chem 7, hBA1C   Labs in 4 months fasting vit D, lipid, HBA1C  New is weekly vitamin D, calcium supplements, and call the pharmacy for multivitamin take one daily

## 2012-08-12 NOTE — Assessment & Plan Note (Signed)
Pelvic and breast exam and rectal exam completed and documented. Exam is within normal, pap sent. Pt counseled re the need to commit to daily physical activity and change in diet to facilitate weight loss and improved health

## 2012-08-12 NOTE — Progress Notes (Signed)
  Subjective:    Patient ID: [name] [name], female    DOB: 04/10/1961, 51 y.o.   MRN: [id number]  HPI The PT is here for annual exam and re-evaluation of chronic medical conditions, medication management and review of any available recent lab and radiology data.  Preventive health is updated, specifically  Cancer screening and Immunization.   Questions or concerns regarding consultations or procedures which the PT has had in the interim are  addressed. The PT denies any adverse reactions to current medications since the last visit.  Has been under significant stress the past few months due to family illness, no focus on healthy lifestyle changes, with no weight loss. Doing better emotionally and mentally now       Review of Systems See HPI Denies recent fever or chills. Denies sinus pressure, nasal congestion, ear pain or sore throat. Denies chest congestion, productive cough or wheezing. Denies chest pains, palpitations and leg swelling Denies abdominal pain, nausea, vomiting,diarrhea or constipation.   Denies dysuria, frequency, hesitancy or incontinence. Denies joint pain, swelling and limitation in mobility. Denies headaches, seizures, numbness, or tingling. Denies depression, anxiety or insomnia. Denies skin break down or rash.        Objective:   Physical Exam Pleasant well nourished female, alert and oriented x 3, in no cardio-pulmonary distress. Afebrile. HEENT No facial trauma or asymetry. Sinuses non tender.  EOMI, PERTL, fundoscopic exam no hemorhage or exudate.  External ears normal, tympanic membranes clear. Oropharynx moist, no exudate, fair dentition. Neck: supple, no adenopathy,JVD or thyromegaly.No bruits.  Chest: Clear to ascultation bilaterally.No crackles or wheezes. Non tender to palpation  Breast: no masses. No nipple discharge or inversion.Scar on left breast in lumpectomy area No axillary or supraclavicular adenopathy  Cardiovascular  system; Heart sounds normal,  S1 and  S2 ,no S3.  No murmur, or thrill. Apical beat not displaced Peripheral pulses normal.  Abdomen: Soft, non tender, no organomegaly or masses. No bruits. Bowel sounds normal. No guarding, tenderness or rebound.  Rectal:  No mass. Guaiac negative stool.  GU: External genitalia normal. No lesions. Vaginal canal normal.physiologic  discharge. Uterus normal size, no adnexal masses, no cervical motion or adnexal tenderness.  Musculoskeletal exam: Full ROM of spine, hips , shoulders and knees. No deformity ,swelling or crepitus noted. No muscle wasting or atrophy.   Neurologic: Cranial nerves 2 to 12 intact. Power, tone ,sensation and reflexes normal throughout. No disturbance in gait. No tremor.  Skin: Intact, no ulceration, erythema , scaling or rash noted. Pigmentation normal throughout  Psych; Normal mood and affect. Judgement and concentration normal        Assessment & Plan:

## 2012-10-02 ENCOUNTER — Other Ambulatory Visit: Payer: Self-pay | Admitting: Family Medicine

## 2012-10-14 ENCOUNTER — Other Ambulatory Visit: Payer: Self-pay | Admitting: Family Medicine

## 2012-12-09 ENCOUNTER — Ambulatory Visit (INDEPENDENT_AMBULATORY_CARE_PROVIDER_SITE_OTHER): Payer: 59 | Admitting: Family Medicine

## 2012-12-09 ENCOUNTER — Encounter: Payer: Self-pay | Admitting: Family Medicine

## 2012-12-09 VITALS — BP 124/84 | HR 92 | Resp 16 | Wt 194.0 lb

## 2012-12-09 DIAGNOSIS — E669 Obesity, unspecified: Secondary | ICD-10-CM

## 2012-12-09 DIAGNOSIS — J0100 Acute maxillary sinusitis, unspecified: Secondary | ICD-10-CM

## 2012-12-09 DIAGNOSIS — J019 Acute sinusitis, unspecified: Secondary | ICD-10-CM

## 2012-12-09 MED ORDER — PHENTERMINE HCL 37.5 MG PO TABS: 37.5000 mg | ORAL_TABLET | Freq: Every day | ORAL | Status: DC

## 2012-12-09 MED ORDER — PENICILLIN V POTASSIUM 500 MG PO TABS: 500.0000 mg | ORAL_TABLET | Freq: Three times a day (TID) | ORAL | Status: AC

## 2012-12-09 MED ORDER — LORATADINE 10 MG PO TABS: 10.0000 mg | ORAL_TABLET | Freq: Every day | ORAL | Status: DC

## 2012-12-09 NOTE — Assessment & Plan Note (Signed)
Unfortunately she has gained 30 pounds over the past year. She's rededicated to weight loss. She has done well with phentermine in the past all restart her on this and she will followup with her PCP as scheduled Discussed side effects of medication

## 2012-12-09 NOTE — Patient Instructions (Addendum)
Start penicillin  Start Claritin for allergies Afrin no more than 3 days in a row Phentermine to be started after antibiotics have completed  Keep previous f/u with Dr. Lodema Hong

## 2012-12-09 NOTE — Progress Notes (Signed)
  Subjective:    Patient ID: [name] [name], female    DOB: 13-Jul-1961, 52 y.o.   MRN: [id number]  HPI  Pt here secondary to left ear pain and popping for past 48 hours, has also had sinus drainage and congestion for the past 3 weeks.+ smoker No fever, no cough, feels very stuffy. Picked up Afrin decongestant yesterday She also has to restart the phentermine. She lost all the way down to 165 pounds using this about a year ago. She has restarted her walking program like to restart this medication. Review of Systems - per above   GEN- denies fatigue, fever, weight loss,weakness, recent illness HEENT- denies eye drainage, change in vision,+ nasal discharge, CVS- denies chest pain, palpitations RESP- denies SOB, cough, wheeze Neuro- denies headache, dizziness, syncope, seizure activity      Objective:   Physical Exam GEN- NAD, alert and oriented x3 HEENT- PERRL, EOMI, non injected sclera, pink conjunctiva, MMM, oropharynx non injected, TM clear bilat no effusion,  + maxillary sinus tenderness,mildly inflammed turbinates,  +Nasal drainage  Neck- Supple, no LAD CVS- RRR, no murmur RESP-CTAB EXT- No edema Pulses- Radial 2+         Assessment & Plan:

## 2012-12-09 NOTE — Assessment & Plan Note (Signed)
Treat for sinusitis. Will start her on penicillin 3 times a day we'll also add Claritin She can use the Afrin given instructions on how to use the decongestant

## 2013-01-06 ENCOUNTER — Ambulatory Visit: Payer: 59 | Admitting: Family Medicine

## 2013-01-27 ENCOUNTER — Encounter (HOSPITAL_COMMUNITY): Payer: 59 | Attending: Oncology | Admitting: Oncology

## 2013-01-27 VITALS — BP 131/94 | HR 99 | Temp 98.1°F | Resp 18 | Wt 194.9 lb

## 2013-01-27 DIAGNOSIS — R209 Unspecified disturbances of skin sensation: Secondary | ICD-10-CM

## 2013-01-27 DIAGNOSIS — C50919 Malignant neoplasm of unspecified site of unspecified female breast: Secondary | ICD-10-CM

## 2013-01-27 DIAGNOSIS — Z853 Personal history of malignant neoplasm of breast: Secondary | ICD-10-CM

## 2013-01-27 NOTE — Progress Notes (Signed)
#  1. Stage II (T1 C. N1) grade 3 left-sided breast cancer status post lumpectomy and lymph node dissection on 10/18/2004 with 2 of 11 positive lymph nodes. Her tumor was ER negative, PR negative, HER-2 3+ overexpressed. Ki-67 marker interestingly was low at 7%. She received Adriamycin and Cytoxan x4 cycles followed by 4 cycles of navelbine and Taxotere. These were dose dense treatments. She also took trastuzumab for year. Thus far she has no evidence for recurrence.  #2. Hypertension on therapy #3. Obesity weighing 194 pounds now up 4 pounds from last year.  Oncology review of systems remains negative. I do not believe she underwent for genetics counseling even though we offered her that option.  She is working full-time. Her medications are recorded. She has her blood work by Dr. Lodema Hong and I will not repeat that.  Her physical exam shows stable vital signs except for her weight. Lymph nodes are negative throughout. Her blood pressure is minimally up at 131/94. Respirations 18-16 and unlabored. Pulse right around 88-92 and regular. Skin exam is unremarkable. She has no obvious thyromegaly. Lungs are clear to auscultation and percussion. Heart shows a regular rhythm and rate without murmur rub or gallop. Abdomen is soft and nontender without organomegaly or masses. Bowel sounds are normal. She has no arm or leg edema. The left breast is slightly distorted but hyperpigmentation. There no masses. The breast is soft but she does complain that he gets a little tight at times and she massages the breast with relief. The right breast is negative for masses.  She still has some dysesthesias of the left upper arm ever since her surgery. I do not think that will change.  We will see her back in the year thus far she remains disease free.

## 2013-01-27 NOTE — Patient Instructions (Addendum)
[facility] Discharge Instructions  RECOMMENDATIONS MADE BY THE CONSULTANT AND ANY TEST RESULTS WILL BE SENT TO YOUR REFERRING PHYSICIAN.  You are doing GREAT! Continue to follow up with your primary medical doctor as scheduled. Report any issues/concerns as needed. Return to clinic in 1 year to see Dr.Neijstrom.  Thank you for choosing [facility] to provide your oncology and hematology care.  To afford each patient quality time with our providers, please arrive at least 15 minutes before your scheduled appointment time.  With your help, our goal is to use those 15 minutes to complete the necessary work-up to ensure our physicians have the information they need to help with your evaluation and healthcare recommendations.    Effective January 1st, 2014, we ask that you re-schedule your appointment with our physicians should you arrive 10 or more minutes late for your appointment.  We strive to give you quality time with our providers, and arriving late affects you and other patients whose appointments are after yours.    Again, thank you for choosing [facility].  Our hope is that these requests will decrease the amount of time that you wait before being seen by our physicians.       _____________________________________________________________  Should you have questions after your visit to [facility], please contact our office at [phone] between the hours of 8:30 a.m. and 5:00 p.m.  Voicemails left after 4:30 p.m. will not be returned until the following business day.  For prescription refill requests, have your pharmacy contact our office with your prescription refill request.

## 2013-02-26 ENCOUNTER — Other Ambulatory Visit: Payer: Self-pay | Admitting: Family Medicine

## 2013-02-26 NOTE — Telephone Encounter (Signed)
Ok to refill 

## 2013-03-06 ENCOUNTER — Ambulatory Visit (INDEPENDENT_AMBULATORY_CARE_PROVIDER_SITE_OTHER): Payer: 59 | Admitting: Family Medicine

## 2013-03-06 ENCOUNTER — Encounter: Payer: Self-pay | Admitting: Family Medicine

## 2013-03-06 VITALS — BP 126/78 | HR 90 | Resp 18 | Ht 62.0 in | Wt 199.1 lb

## 2013-03-06 DIAGNOSIS — E785 Hyperlipidemia, unspecified: Secondary | ICD-10-CM

## 2013-03-06 DIAGNOSIS — R5381 Other malaise: Secondary | ICD-10-CM

## 2013-03-06 DIAGNOSIS — M25579 Pain in unspecified ankle and joints of unspecified foot: Secondary | ICD-10-CM

## 2013-03-06 DIAGNOSIS — R7309 Other abnormal glucose: Secondary | ICD-10-CM

## 2013-03-06 DIAGNOSIS — R7303 Prediabetes: Secondary | ICD-10-CM

## 2013-03-06 DIAGNOSIS — I10 Essential (primary) hypertension: Secondary | ICD-10-CM

## 2013-03-06 DIAGNOSIS — R7301 Impaired fasting glucose: Secondary | ICD-10-CM

## 2013-03-06 DIAGNOSIS — E669 Obesity, unspecified: Secondary | ICD-10-CM

## 2013-03-06 DIAGNOSIS — M25572 Pain in left ankle and joints of left foot: Secondary | ICD-10-CM | POA: Insufficient documentation

## 2013-03-06 MED ORDER — PREDNISONE 5 MG PO TABS: 5.0000 mg | ORAL_TABLET | Freq: Two times a day (BID) | ORAL | Status: AC

## 2013-03-06 MED ORDER — PHENTERMINE HCL 37.5 MG PO TBDP: 1.0000 | ORAL_TABLET | Freq: Every day | ORAL | Status: DC

## 2013-03-06 MED ORDER — IBUPROFEN 800 MG PO TABS: 800.0000 mg | ORAL_TABLET | Freq: Three times a day (TID) | ORAL | Status: DC | PRN

## 2013-03-06 NOTE — Progress Notes (Signed)
  Subjective:    Patient ID: [name] [name], female    DOB: Mar 14, 1961, 52 y.o.   MRN: [id number]  HPI The PT is here for follow up and re-evaluation of chronic medical conditions, medication management and review of any available recent lab and radiology data.  Preventive health is updated, specifically  Cancer screening and Immunization.   Questions or concerns regarding consultations or procedures which the PT has had in the interim are  addressed. The PT denies any adverse reactions to current medications since the last visit.  Left ankle pain x several weeks, no inciting trauma, but limiting ability to engage in regular exercise as she wishes Concerned about weight gain and wants medication to help with this      Review of Systems See HPI Denies recent fever or chills. Denies sinus pressure, nasal congestion, ear pain or sore throat. Denies chest congestion, productive cough or wheezing. Denies chest pains, palpitations and leg swelling Denies abdominal pain, nausea, vomiting,diarrhea or constipation.   Denies dysuria, frequency, hesitancy or incontinence.  Denies headaches, seizures, numbness, or tingling. Denies depression, anxiety or insomnia. Denies skin break down or rash.        Objective:   Physical Exam  Patient alert and oriented and in no cardiopulmonary distress.  HEENT: No facial asymmetry, EOMI, no sinus tenderness,  oropharynx pink and moist.  Neck supple no adenopathy.  Chest: Clear to auscultation bilaterally.  CVS: S1, S2 no murmurs, no S3.  ABD: Soft non tender. Bowel sounds normal.  Ext: No edema  MS: Adequate ROM spine, shoulders, hips and knees.Decreased ROM ankle  Skin: Intact, no ulcerations or rash noted.  Psych: Good eye contact, normal affect. Memory intact not anxious or depressed appearing.  CNS: CN 2-12 intact, power, tone and sensation normal throughout.       Assessment & Plan:

## 2013-03-06 NOTE — Patient Instructions (Signed)
F/u in 4 month, call if you need me before  Resume HALF phentermine daily   You will get a 1500 cal diet sheet  Commit exercise at least 30 mins 5 days per week  Weight loss goal is 12 pounds  Stop sweet/sugar, also reduce fatty foods  HBA1C, fasting lipid, chem 7  In 4 month

## 2013-03-08 NOTE — Assessment & Plan Note (Signed)
Deteriorated. Patient re-educated about  the importance of commitment to a  minimum of 150 minutes of exercise per week. The importance of healthy food choices with portion control discussed. Encouraged to start a food diary, count calories and to consider  joining a support group. Sample diet sheets offered. Goals set by the patient for the next several months.    

## 2013-03-08 NOTE — Assessment & Plan Note (Signed)
Hyperlipidemia:Low fat diet discussed and encouraged.  Dietary management only at this time

## 2013-03-08 NOTE — Assessment & Plan Note (Signed)
Trial of anti inflammatories orally, if inadequate response, will need to see orhtopedics

## 2013-03-08 NOTE — Assessment & Plan Note (Signed)
Patient educated about the importance of limiting  Carbohydrate intake , the need to commit to daily physical activity for a minimum of 30 minutes , and to commit weight loss. The fact that changes in all these areas will reduce or eliminate all together the development of diabetes is stressed.   Updated lab next visit 

## 2013-03-08 NOTE — Assessment & Plan Note (Signed)
Controlled, no change in medication DASH diet and commitment to daily physical activity for a minimum of 30 minutes discussed and encouraged, as a part of hypertension management. The importance of attaining a healthy weight is also discussed.  

## 2013-03-18 ENCOUNTER — Other Ambulatory Visit (HOSPITAL_COMMUNITY): Payer: Self-pay | Admitting: Oncology

## 2013-03-18 DIAGNOSIS — C50912 Malignant neoplasm of unspecified site of left female breast: Secondary | ICD-10-CM

## 2013-04-11 ENCOUNTER — Other Ambulatory Visit: Payer: Self-pay | Admitting: Family Medicine

## 2013-04-16 ENCOUNTER — Ambulatory Visit (HOSPITAL_COMMUNITY)
Admission: RE | Admit: 2013-04-16 | Discharge: 2013-04-16 | Disposition: A | Discharge status: Home / Self Care | Payer: 59 | Source: Ambulatory Visit | Attending: Oncology | Admitting: Oncology

## 2013-04-16 DIAGNOSIS — C50912 Malignant neoplasm of unspecified site of left female breast: Secondary | ICD-10-CM

## 2013-04-16 DIAGNOSIS — Z853 Personal history of malignant neoplasm of breast: Secondary | ICD-10-CM | POA: Insufficient documentation

## 2013-06-16 ENCOUNTER — Other Ambulatory Visit: Payer: Self-pay | Admitting: Family Medicine

## 2013-06-16 LAB — LIPID PANEL
HDL: 50 mg/dL (ref >39)
LDL Cholesterol: 120 mg/dL — ABNORMAL HIGH (ref 0–99)
Triglycerides: 72 mg/dL (ref <150)
VLDL: 14 mg/dL (ref 0–40)

## 2013-06-16 LAB — BASIC METABOLIC PANEL
Chloride: 103 mEq/L (ref 96–112)
Potassium: 4.2 mEq/L (ref 3.5–5.3)

## 2013-06-25 ENCOUNTER — Other Ambulatory Visit: Payer: Self-pay | Admitting: Family Medicine

## 2013-06-30 ENCOUNTER — Encounter: Payer: Self-pay | Admitting: Family Medicine

## 2013-06-30 ENCOUNTER — Ambulatory Visit (INDEPENDENT_AMBULATORY_CARE_PROVIDER_SITE_OTHER): Payer: 59 | Admitting: Family Medicine

## 2013-06-30 VITALS — BP 140/84 | HR 87 | Resp 16 | Ht 62.0 in | Wt 199.4 lb

## 2013-06-30 DIAGNOSIS — R5381 Other malaise: Secondary | ICD-10-CM

## 2013-06-30 DIAGNOSIS — I10 Essential (primary) hypertension: Secondary | ICD-10-CM

## 2013-06-30 DIAGNOSIS — E669 Obesity, unspecified: Secondary | ICD-10-CM

## 2013-06-30 DIAGNOSIS — E559 Vitamin D deficiency, unspecified: Secondary | ICD-10-CM

## 2013-06-30 DIAGNOSIS — J309 Allergic rhinitis, unspecified: Secondary | ICD-10-CM

## 2013-06-30 DIAGNOSIS — R7303 Prediabetes: Secondary | ICD-10-CM

## 2013-06-30 DIAGNOSIS — E785 Hyperlipidemia, unspecified: Secondary | ICD-10-CM

## 2013-06-30 DIAGNOSIS — F17200 Nicotine dependence, unspecified, uncomplicated: Secondary | ICD-10-CM

## 2013-06-30 DIAGNOSIS — R7309 Other abnormal glucose: Secondary | ICD-10-CM

## 2013-06-30 MED ORDER — METFORMIN HCL ER 500 MG PO TB24: 500.0000 mg | ORAL_TABLET | Freq: Every day | ORAL | Status: DC

## 2013-06-30 MED ORDER — FLUTICASONE PROPIONATE 50 MCG/ACT NA SUSP: 2.0000 | Freq: Every day | NASAL | Status: DC

## 2013-06-30 MED ORDER — PHENTERMINE HCL 37.5 MG PO TABS: 37.5000 mg | ORAL_TABLET | Freq: Every day | ORAL | Status: DC

## 2013-06-30 NOTE — Patient Instructions (Signed)
F/u in 4 month, call if you need me before pleas  Please commit to consistent change in eating habits, and take medication yo help to curb appetite regularly  Weight loss goal of 2 pounds per month  CBc, fasting chem 7 , TSH and vit D in 4 months  Please cut back to 4 ciggs daily starting today, with a quit goal of 5 weeks from now  New meds flonase for allergies, metformin for blood sugar, you are prediabetic  Exercise and increased fruit and vegetable and low salt diet will improve blood pressure and also bad cholesterol  Happy your daughter is nearby!  Resume calcium with D daily for bone health

## 2013-07-06 NOTE — Assessment & Plan Note (Signed)
Fair though sub optimal control. DASH diet and commitment to daily physical activity for a minimum of 30 minutes discussed and encouraged, as a part of hypertension management. The importance of attaining a healthy weight is also discussed. No med change

## 2013-07-06 NOTE — Assessment & Plan Note (Signed)
Pt to start metformin Patient educated about the importance of limiting  Carbohydrate intake , the need to commit to daily physical activity for a minimum of 30 minutes , and to commit weight loss. The fact that changes in all these areas will reduce or eliminate all together the development of diabetes is stressed.    

## 2013-07-06 NOTE — Assessment & Plan Note (Signed)
Patient counseled for approximately 5 minutes regarding the health risks of ongoing nicotine use, specifically all types of cancer, heart disease, stroke and respiratory failure. The options available for help with cessation ,the behavioral changes to assist the process, and the option to either gradully reduce usage  Or abruptly stop.is also discussed. Pt is also encouraged to set specific goals in number of cigarettes used daily, as well as to set a quit date. Unwilling to quit at this time

## 2013-07-06 NOTE — Assessment & Plan Note (Signed)
Elevated LDL Hyperlipidemia:Low fat diet discussed and encouraged.   

## 2013-07-06 NOTE — Assessment & Plan Note (Signed)
Uncontrolled , add flonase 

## 2013-07-06 NOTE — Progress Notes (Signed)
  Subjective:    Patient ID: [name] [name], female    DOB: 10/25/1960, 52 y.o.   MRN: [id number]  HPI The PT is here for follow up and re-evaluation of chronic medical conditions, medication management and review of any available recent lab and radiology data.  Preventive health is updated, specifically  Cancer screening and Immunization.   Questions or concerns regarding consultations or procedures which the PT has had in the interim are  addressed. The PT denies any adverse reactions to current medications since the last visit.  There are no new concerns except lack of consistent behavior change with weight gain There are no specific complaints       Review of Systems See HPI Denies recent fever or chills. Denies sinus pressure, nasal congestion, ear pain or sore throat. Denies chest congestion, productive cough or wheezing. Denies chest pains, palpitations and leg swelling Denies abdominal pain, nausea, vomiting,diarrhea or constipation.   Denies dysuria, frequency, hesitancy or incontinence. Denies joint pain, swelling and limitation in mobility. Denies headaches, seizures, numbness, or tingling. Denies uncontrolled  depression, anxiety or insomnia.Does report unhappy marriage , spouse "doesn't do like he should" Denies skin break down or rash.        Objective:   Physical Exam  Patient alert and oriented and in no cardiopulmonary distress.  HEENT: No facial asymmetry, EOMI, no sinus tenderness,  oropharynx pink and moist.  Neck supple no adenopathy.  Chest: Clear to auscultation bilaterally.  CVS: S1, S2 no murmurs, no S3.  ABD: Soft non tender. Bowel sounds normal.  Ext: No edema  MS: Adequate ROM spine, shoulders, hips and knees.  Skin: Intact, no ulcerations or rash noted.  Psych: Good eye contact, normal affect. Memory intact not anxious or depressed appearing.  CNS: CN 2-12 intact, power, tone and sensation normal throughout.       Assessment &  Plan:

## 2013-07-06 NOTE — Assessment & Plan Note (Signed)
Deteriorated. Patient re-educated about  the importance of commitment to a  minimum of 150 minutes of exercise per week. The importance of healthy food choices with portion control discussed. Encouraged to start a food diary, count calories and to consider  joining a support group. Sample diet sheets offered. Goals set by the patient for the next several months.   Pt toresume phentermine  

## 2013-10-10 ENCOUNTER — Other Ambulatory Visit: Payer: Self-pay | Admitting: Family Medicine

## 2013-10-27 ENCOUNTER — Ambulatory Visit (INDEPENDENT_AMBULATORY_CARE_PROVIDER_SITE_OTHER): Payer: 59 | Admitting: Family Medicine

## 2013-10-27 ENCOUNTER — Encounter (INDEPENDENT_AMBULATORY_CARE_PROVIDER_SITE_OTHER): Payer: Self-pay

## 2013-10-27 ENCOUNTER — Encounter: Payer: Self-pay | Admitting: Family Medicine

## 2013-10-27 VITALS — BP 130/94 | HR 86 | Resp 18 | Ht 62.0 in | Wt 198.0 lb

## 2013-10-27 DIAGNOSIS — Z1212 Encounter for screening for malignant neoplasm of rectum: Secondary | ICD-10-CM

## 2013-10-27 DIAGNOSIS — E785 Hyperlipidemia, unspecified: Secondary | ICD-10-CM

## 2013-10-27 DIAGNOSIS — Z1211 Encounter for screening for malignant neoplasm of colon: Secondary | ICD-10-CM

## 2013-10-27 DIAGNOSIS — F17200 Nicotine dependence, unspecified, uncomplicated: Secondary | ICD-10-CM

## 2013-10-27 DIAGNOSIS — R7309 Other abnormal glucose: Secondary | ICD-10-CM

## 2013-10-27 DIAGNOSIS — E669 Obesity, unspecified: Secondary | ICD-10-CM

## 2013-10-27 DIAGNOSIS — R7303 Prediabetes: Secondary | ICD-10-CM

## 2013-10-27 DIAGNOSIS — E8881 Metabolic syndrome: Secondary | ICD-10-CM

## 2013-10-27 DIAGNOSIS — I10 Essential (primary) hypertension: Secondary | ICD-10-CM

## 2013-10-27 LAB — POC HEMOCCULT BLD/STL (OFFICE/1-CARD/DIAGNOSTIC): FECAL OCCULT BLD: NEGATIVE

## 2013-10-27 NOTE — Patient Instructions (Addendum)
F/u in 3.5 month, call if you need me today  Please start metformin as prescribed  HBA1C and chem 7 today  Rectal today  Continue to watch rash in left armpit and also show the oncologist at next visit  Blood pressure is too high, cut down canned foods and salt and commit to daily exercise and weight loss to help this. Eat 50 % fresh or frozen fruit and veg  Weight loss goal of 1.5 to 2 pounds per month  Take phentermine half daily  Metabolic Syndrome, Adult Metabolic syndrome descibes a group of risk factors for heart disease and diabetes. This syndrome has other names including Insulin Resistance Syndrome. The more risk factors you have, the higher your risk of having a heart attack, stroke, or developing diabetes. These risk factors include:  High blood sugar.  High blood triglyceride (a fat found in the blood) level.  High blood pressure.  Abdominal obesity (your extra weight is around your waist instead of your hips).  Low levels of high-density lipoprotein, HDL (good blood cholesterol). If you have any three of these risk factors, you have metabolic syndrome. If you have even one of these factors, you should make lifestyle changes to improve your health in order to prevent serious health diseases.  In people with metabolic syndrome, the cells do not respond properly to insulin. This can lead to high levels of glucose in the blood, which can interfere with normal body processes. Eventually, this can cause high blood pressure and higher fat levels in the blood, and inflammation of your blood vessels. The result can be heart disease and stroke.  CAUSES   Eating a diet rich in calories and saturated fat.  Too little physical activity.  Being overweight. Other underlying causes are:  Family history (genetics).  Ethnicity (South Asians are at a higher risk).  Older age (your chances of developing metabolic syndrome are higher as you grow older).  Insulin  resistance. SYMPTOMS  By itself, metabolic syndrome has no symptoms. However, you might have symptoms of diabetes (high blood sugar) or high blood pressure, such as:  Increased thirst, urination, and tiredness.  Dizzy spells.  Dull headaches that are unusual for you.  Blurred vision.  Nosebleeds. DIAGNOSIS  Your caregiver may make a diagnosis of metabolic syndrome if you have at least three of these factors:  If you are overweight mostly around the waist. This means a waistline greater than 40" in men and more than 35" in women. The waistline limits are 31 to 35 inches for women and 37 to 39 inches for men. In those who have certain genetic risk factors, such as having a family history of diabetes or being of Asian descent.  If you have a blood pressure of 130/85 mm Hg or more, or if you are being treated for high blood pressure.  If your blood triglyceride level is 150 mg/dL or more, or you are being treated for high levels of triglyceride.  If the level of HDL in your blood is below 40 mg/dL in men, less than 50 mg/dL in women, or you are receiving treatment for low levels of HDL.  If the level of sugar in your blood is high with fasting blood sugar level of 110 mg/dL or more, or you are under treatment for diabetes. TREATMENT  Your caregiver may have you make lifestyle changes, which may include:  Exercise.  Losing weight.  Maintaining a healthy diet.  Quitting smoking. The lifestyle changes listed above are key  in reducing your risk for heart disease and stroke. Medicines may also be prescribed to help your body respond to insulin better and to reduce your blood pressure and blood fat levels. Aspirin may be recommended to reduce risks of heart disease or stroke.  HOME CARE INSTRUCTIONS   Exercise.  Measure your waist at regular intervals just above the hipbones after you have breathed out.  Maintain a healthy diet.  Eat fruits, such as apples, oranges, and  pears.  Eat vegetables.  Eat legumes, such as kidney beans, peas, and lentils.  Eat food rich in soluble fiber, such as whole grain cereal, oatmeal, and oat bran.  Use olive or safflower oils and avoid saturated fats.  Eat nuts.  Limit the amount of salt you eat or add to food.  Limit the amount of alcohol you drink.  Include fish in your diet, if possible.  Stop smoking if you are a smoker.  Maintain regular follow-up appointments.  Follow your caregiver's advice. SEEK MEDICAL CARE IF:   You feel very tired or fatigued.  You develop excessive thirst.  You pass large quantities of urine.  You are putting on weight around your waist rather than losing weight.  You develop headaches over and over again.  You have off-and-on dizzy spells. SEEK IMMEDIATE MEDICAL CARE IF:   You develop nosebleeds.  You develop sudden blurred vision.  You develop sudden dizzy spells.  You develop chest pains, trouble breathing, or feel an abnormal or irregular heart beat.  You have a fainting episode.  You develop any sudden trouble speaking and/or swallowing.  You develop sudden weakness in one arm and/or one leg. MAKE SURE YOU:   Understand these instructions.  Will watch your condition.  Will get help right away if you are not doing well or get worse. Document Released: 12/19/2007 Document Revised: 12/04/2011 Document Reviewed: 12/19/2007 [facility] Patient Information 2014 [place], [place].   Ou have metaboloc synd which puts you at increased risk of heart disease, changing lifestyle will change this Please plan to stop smokuing in the next several, month

## 2013-10-28 ENCOUNTER — Telehealth: Payer: Self-pay | Admitting: Family Medicine

## 2013-10-28 DIAGNOSIS — R7303 Prediabetes: Secondary | ICD-10-CM

## 2013-10-28 MED ORDER — METFORMIN HCL ER 500 MG PO TB24: 500.0000 mg | ORAL_TABLET | Freq: Every day | ORAL | Status: DC

## 2013-10-28 NOTE — Telephone Encounter (Signed)
Med refilled to [facility] as requested

## 2013-10-30 LAB — BASIC METABOLIC PANEL
BUN: 14 mg/dL (ref 6–23)
CALCIUM: 10 mg/dL (ref 8.4–10.5)
CO2: 31 mEq/L (ref 19–32)
Chloride: 101 mEq/L (ref 96–112)
Creat: 0.77 mg/dL (ref 0.50–1.10)
GLUCOSE: 81 mg/dL (ref 70–99)
POTASSIUM: 4.2 meq/L (ref 3.5–5.3)
SODIUM: 143 meq/L (ref 135–145)

## 2013-10-30 LAB — HEMOGLOBIN A1C
HEMOGLOBIN A1C: 6.1 % — AB (ref <5.7)
Mean Plasma Glucose: 128 mg/dL — ABNORMAL HIGH (ref <117)

## 2013-11-16 DIAGNOSIS — E8881 Metabolic syndrome: Secondary | ICD-10-CM | POA: Insufficient documentation

## 2013-11-16 NOTE — Progress Notes (Signed)
Subjective:    Patient ID: [name] [name], female    DOB: December 09, 1960, 53 y.o.   MRN: [id number]  HPI The PT is here for follow up and re-evaluation of chronic medical conditions, medication management and review of any available recent lab and radiology data.  Preventive health is updated, specifically  Cancer screening and Immunization.   Questions or concerns regarding consultations or procedures which the PT has had in the interim are  addressed. The PT denies any adverse reactions to current medications since the last visit.  C/o rash in left armpit where she had radiation for breast cancer, not pruritic , no drainage, pt just doesn't like the look , also requests medication to reduce hyperpigmentation present in a scar, however, when she is made aware of the cost she has decided against this. Inconsistent exercise and unable to commit to dietary change without the help of phentermine to lose weight ready to start this again      Review of Systems See HPI Denies recent fever or chills. Denies sinus pressure, nasal congestion, ear pain or sore throat. Denies chest congestion, productive cough or wheezing. Denies chest pains, palpitations and leg swelling Denies abdominal pain, nausea, vomiting,diarrhea or constipation.   Denies dysuria, frequency, hesitancy or incontinence. Denies joint pain, swelling and limitation in mobility. Denies headaches, seizures, numbness, or tingling. Denies depression, anxiety or insomnia.         Objective:   Physical Exam BP 130/94  Pulse 86  Resp 18  Ht 5\' 2"  (1.575 m)  Wt 198 lb (89.812 kg)  BMI 36.21 kg/m2  SpO2 96% Patient alert and oriented and in no cardiopulmonary distress.  HEENT: No facial asymmetry, EOMI, no sinus tenderness,  oropharynx pink and moist.  Neck supple no adenopathy.  Chest: Clear to auscultation bilaterally.  CVS: S1, S2 no murmurs, no S3.  ABD: Soft non tender. Bowel sounds normal. Rectal: no mass, heme  negative stool Ext: No edema  MS: Adequate ROM spine, shoulders, hips and knees.  Skin: Intact, no ulcerations  Noted.hyperpigmented scar in left axilla and on forearm, no erythema or drainage noted, pigmentation uniform throughout  Psych: Good eye contact, normal affect. Memory intact not anxious or depressed appearing.  CNS: CN 2-12 intact, power, tone and sensation normal throughout.        Assessment & Plan:  HYPERTENSION Uncontrolled, elevated DBP, no med change DASH diet and commitment to daily physical activity for a minimum of 30 minutes discussed and encouraged, as a part of hypertension management. The importance of attaining a healthy weight is also discussed.   Prediabetes Unchanged. Patient educated about the importance of limiting  Carbohydrate intake , the need to commit to daily physical activity for a minimum of 30 minutes , and to commit weight loss. The fact that changes in all these areas will reduce or eliminate all together the development of diabetes is stressed.   Pt to take metformin also  OBESITY Unchanged Patient re-educated about  the importance of commitment to a  minimum of 150 minutes of exercise per week. The importance of healthy food choices with portion control discussed. Encouraged to start a food diary, count calories and to consider  joining a support group. Sample diet sheets offered. Goals set by the patient for the next several months.     HYPERLIPIDEMIA Elevated lDL Hyperlipidemia:Low fat diet discussed and encouraged.  Updated lab needed at/ before next visit.   Metabolic syndrome X The increased risk of cardiovascular disease  associated with this diagnosis, and the need to consistently work on lifestyle to change this is discussed. Following  a  heart healthy diet ,commitment to 30 minutes of exercise at least 5 days per week, as well as control of blood sugar and cholesterol , and achieving a healthy weight are all the areas  to be addressed .    Nicotine dependence Unchanged Patient counseled for approximately 5 minutes regarding the health risks of ongoing nicotine use, specifically all types of cancer, heart disease, stroke and respiratory failure. The options available for help with cessation ,the behavioral changes to assist the process, and the option to either gradully reduce usage  Or abruptly stop.is also discussed. Pt is also encouraged to set specific goals in number of cigarettes used daily, as well as to set a quit date.

## 2013-11-16 NOTE — Assessment & Plan Note (Signed)
The increased risk of cardiovascular disease associated with this diagnosis, and the need to consistently work on lifestyle to change this is discussed. Following  a  heart healthy diet ,commitment to 30 minutes of exercise at least 5 days per week, as well as control of blood sugar and cholesterol , and achieving a healthy weight are all the areas to be addressed .  

## 2013-11-16 NOTE — Assessment & Plan Note (Signed)
Unchanged. Patient re-educated about  the importance of commitment to a  minimum of 150 minutes of exercise per week. The importance of healthy food choices with portion control discussed. Encouraged to start a food diary, count calories and to consider  joining a support group. Sample diet sheets offered. Goals set by the patient for the next several months.    

## 2013-11-16 NOTE — Assessment & Plan Note (Signed)
Elevated lDL Hyperlipidemia:Low fat diet discussed and encouraged.  Updated lab needed at/ before next visit.

## 2013-11-16 NOTE — Assessment & Plan Note (Signed)
Unchanged. Patient counseled for approximately 5 minutes regarding the health risks of ongoing nicotine use, specifically all types of cancer, heart disease, stroke and respiratory failure. The options available for help with cessation ,the behavioral changes to assist the process, and the option to either gradully reduce usage  Or abruptly stop.is also discussed. Pt is also encouraged to set specific goals in number of cigarettes used daily, as well as to set a quit date.  

## 2013-11-16 NOTE — Assessment & Plan Note (Signed)
Unchanged. Patient educated about the importance of limiting  Carbohydrate intake , the need to commit to daily physical activity for a minimum of 30 minutes , and to commit weight loss. The fact that changes in all these areas will reduce or eliminate all together the development of diabetes is stressed.   Pt to take metformin also

## 2013-11-16 NOTE — Assessment & Plan Note (Signed)
Uncontrolled, elevated DBP, no med change DASH diet and commitment to daily physical activity for a minimum of 30 minutes discussed and encouraged, as a part of hypertension management. The importance of attaining a healthy weight is also discussed.

## 2013-12-25 ENCOUNTER — Other Ambulatory Visit: Payer: Self-pay

## 2013-12-25 ENCOUNTER — Other Ambulatory Visit: Payer: Self-pay | Admitting: Family Medicine

## 2013-12-25 MED ORDER — HYDROCHLOROTHIAZIDE 25 MG PO TABS: ORAL_TABLET | ORAL | Status: DC

## 2014-01-05 ENCOUNTER — Other Ambulatory Visit: Payer: Self-pay | Admitting: Family Medicine

## 2014-01-05 NOTE — Telephone Encounter (Signed)
Dr Simpsons patient 

## 2014-01-08 ENCOUNTER — Other Ambulatory Visit: Payer: Self-pay | Admitting: Family Medicine

## 2014-01-26 ENCOUNTER — Encounter (HOSPITAL_COMMUNITY): Payer: Self-pay

## 2014-01-26 ENCOUNTER — Ambulatory Visit (HOSPITAL_COMMUNITY): Payer: 59

## 2014-01-26 ENCOUNTER — Encounter (HOSPITAL_COMMUNITY): Payer: 59 | Attending: Hematology and Oncology

## 2014-01-26 VITALS — BP 136/96 | HR 90 | Temp 98.1°F | Resp 18 | Wt 198.0 lb

## 2014-01-26 DIAGNOSIS — Z9221 Personal history of antineoplastic chemotherapy: Secondary | ICD-10-CM | POA: Insufficient documentation

## 2014-01-26 DIAGNOSIS — Z09 Encounter for follow-up examination after completed treatment for conditions other than malignant neoplasm: Secondary | ICD-10-CM | POA: Insufficient documentation

## 2014-01-26 DIAGNOSIS — Z853 Personal history of malignant neoplasm of breast: Secondary | ICD-10-CM

## 2014-01-26 DIAGNOSIS — Z9010 Acquired absence of unspecified breast and nipple: Secondary | ICD-10-CM | POA: Insufficient documentation

## 2014-01-26 DIAGNOSIS — E8881 Metabolic syndrome: Secondary | ICD-10-CM | POA: Insufficient documentation

## 2014-01-26 DIAGNOSIS — J309 Allergic rhinitis, unspecified: Secondary | ICD-10-CM | POA: Insufficient documentation

## 2014-01-26 DIAGNOSIS — E669 Obesity, unspecified: Secondary | ICD-10-CM | POA: Insufficient documentation

## 2014-01-26 DIAGNOSIS — I10 Essential (primary) hypertension: Secondary | ICD-10-CM

## 2014-01-26 DIAGNOSIS — F17200 Nicotine dependence, unspecified, uncomplicated: Secondary | ICD-10-CM | POA: Insufficient documentation

## 2014-01-26 DIAGNOSIS — C50919 Malignant neoplasm of unspecified site of unspecified female breast: Secondary | ICD-10-CM

## 2014-01-26 DIAGNOSIS — E559 Vitamin D deficiency, unspecified: Secondary | ICD-10-CM | POA: Insufficient documentation

## 2014-01-26 DIAGNOSIS — E785 Hyperlipidemia, unspecified: Secondary | ICD-10-CM | POA: Insufficient documentation

## 2014-01-26 DIAGNOSIS — I890 Lymphedema, not elsewhere classified: Secondary | ICD-10-CM | POA: Insufficient documentation

## 2014-01-26 LAB — CBC WITH DIFFERENTIAL/PLATELET
BASOS ABS: 0 10*3/uL (ref 0.0–0.1)
Basophils Relative: 0 % (ref 0–1)
Eosinophils Absolute: 0.3 10*3/uL (ref 0.0–0.7)
Eosinophils Relative: 3 % (ref 0–5)
HCT: 40.9 % (ref 36.0–46.0)
Hemoglobin: 13.9 g/dL (ref 12.0–15.0)
LYMPHS ABS: 4.3 10*3/uL — AB (ref 0.7–4.0)
Lymphocytes Relative: 45 % (ref 12–46)
MCH: 31 pg (ref 26.0–34.0)
MCHC: 34 g/dL (ref 30.0–36.0)
MCV: 91.3 fL (ref 78.0–100.0)
Monocytes Absolute: 0.6 10*3/uL (ref 0.1–1.0)
Monocytes Relative: 6 % (ref 3–12)
NEUTROS ABS: 4.5 10*3/uL (ref 1.7–7.7)
NEUTROS PCT: 46 % (ref 43–77)
Platelets: 448 10*3/uL — ABNORMAL HIGH (ref 150–400)
RBC: 4.48 MIL/uL (ref 3.87–5.11)
RDW: 13.8 % (ref 11.5–15.5)
WBC: 9.7 10*3/uL (ref 4.0–10.5)

## 2014-01-26 LAB — COMPREHENSIVE METABOLIC PANEL
ALK PHOS: 79 U/L (ref 39–117)
ALT: 19 U/L (ref 0–35)
AST: 18 U/L (ref 0–37)
Albumin: 3.8 g/dL (ref 3.5–5.2)
BUN: 14 mg/dL (ref 6–23)
CALCIUM: 9.7 mg/dL (ref 8.4–10.5)
CO2: 26 mEq/L (ref 19–32)
Chloride: 100 mEq/L (ref 96–112)
Creatinine, Ser: 0.64 mg/dL (ref 0.50–1.10)
GFR calc Af Amer: >90 mL/min (ref >90)
Glucose, Bld: 85 mg/dL (ref 70–99)
POTASSIUM: 3.6 meq/L — AB (ref 3.7–5.3)
SODIUM: 138 meq/L (ref 137–147)
Total Bilirubin: 0.2 mg/dL — ABNORMAL LOW (ref 0.3–1.2)
Total Protein: 7.5 g/dL (ref 6.0–8.3)

## 2014-01-26 NOTE — Patient Instructions (Signed)
[facility] Discharge Instructions  RECOMMENDATIONS MADE BY THE CONSULTANT AND ANY TEST RESULTS WILL BE SENT TO YOUR REFERRING PHYSICIAN.  We will see you in 1 year for follow doctor visit and lab work.  Thank you for choosing [place] to provide your oncology and hematology care.  To afford each patient quality time with our providers, please arrive at least 15 minutes before your scheduled appointment time.  With your help, our goal is to use those 15 minutes to complete the necessary work-up to ensure our physicians have the information they need to help with your evaluation and healthcare recommendations.    Effective January 1st, 2014, we ask that you re-schedule your appointment with our physicians should you arrive 10 or more minutes late for your appointment.  We strive to give you quality time with our providers, and arriving late affects you and other patients whose appointments are after yours.    Again, thank you for choosing [facility].  Our hope is that these requests will decrease the amount of time that you wait before being seen by our physicians.       _____________________________________________________________  Should you have questions after your visit to [facility], please contact our office at [phone] between the hours of 8:30 a.m. and 5:00 p.m.  Voicemails left after 4:30 p.m. will not be returned until the following business day.  For prescription refill requests, have your pharmacy contact our office with your prescription refill request.

## 2014-01-26 NOTE — Progress Notes (Signed)
[place]  OFFICE PROGRESS NOTE  [name] Nakayama, MD [address] [place] [address]  DIAGNOSIS: Malignant neoplasm of breast (female), unspecified site - Plan: CBC with Differential, Comprehensive metabolic panel, CEA, Cancer antigen 27.29, CBC with Differential, Comprehensive metabolic panel, CEA, Cancer antigen 27.29, CBC with Differential, Comprehensive metabolic panel, CEA, Cancer antigen 22.97  Metabolic syndrome X  Chief Complaint  Patient presents with  . Breast cancer HER-2/neu positive diagnosed in 2006    CURRENT THERAPY: Watchful expectation and surveillance.  INTERVAL HISTORY: [name] [name] 53 y.o. female returns for followup of stage II left breast cancer, status post lumpectomy, axillary lymph node dissection, chemotherapy with Adriamycin and Cytoxan for 4 cycles followed by now will be in Taxotere for 4 cycles given in a dose dense fashion followed by one year of trastuzumab. Surgery was performed on 10/18/2004.  The patient practices self breast examination and has only low-dose intermittent lymphedema. Appetite is good with no nausea, vomiting, fever, night sweats, diarrhea, constipation, melena, hematochezia, incontinence, hematuria, vaginal bleeding or discharge, back pain, skin rash, headache, or seizures. She works as a [employer]arry manager.  MEDICAL HISTORY: Past Medical History  Diagnosis Date  . Hyperlipidemia   . Adenocarcinoma of breast     left   . Obesity   . Heel spur   . Hypertension     INTERIM HISTORY: has ADENOCARCINOMA, BREAST, LEFT; HYPERLIPIDEMIA; OBESITY; HYPERTENSION; ALLERGIC RHINITIS CAUSE UNSPECIFIED; HEEL SPUR; FATIGUE; Hematuria; Prediabetes; Vitamin D deficiency; Routine general medical examination at a health care facility; Pain in left ankle; Nicotine dependence; and Metabolic syndrome X on her problem list.   Stage II (T1 C. N1) grade 3 left-sided breast cancer status post lumpectomy and  lymph node dissection on 10/18/2004 with 2 of 11 positive lymph nodes. Her tumor was ER negative, PR negative, HER-2 3+ overexpressed. Ki-67 marker interestingly was low at 7%. She received Adriamycin and Cytoxan x4 cycles followed by 4 cycles of navelbine and Taxotere in a dose dense fashion followed by one year of treatment with Trastuzumab.  ALLERGIES:  is allergic to ace inhibitors.  MEDICATIONS: has a current medication list which includes the following prescription(s): fluticasone, ibuprofen, ibuprofen, potassium chloride sa, hydrochlorothiazide, loratadine, loratadine, metformin, multivitamin, and hair vitamins.  SURGICAL HISTORY:  Past Surgical History  Procedure Laterality Date  . Btl  1992  . Cholecystectomy    . Left lumpectomy and lymph node disection  2000  . R heel surgery for spur and chipped bone  11/09/20099  . Tubal ligation    . Colonoscopy  09/15/2011    Procedure: COLONOSCOPY;  Surgeon: Dorothyann Peng, MD;  Location: [facility];  Service: Endoscopy;  Laterality: N/A;  9:15 AM    FAMILY HISTORY: family history includes Diabetes in her sister; Glaucoma in her father; Heart attack in her father and mother; Hypertension in her father and sister. There is no history of Anesthesia problems, Hypotension, Malignant hyperthermia, or Pseudochol deficiency.  SOCIAL HISTORY:  reports that she has been smoking Cigarettes.  She has a 3.75 pack-year smoking history. She does not have any smokeless tobacco history on file. She reports that she does not drink alcohol or use illicit drugs.  REVIEW OF SYSTEMS:  Other than that discussed above is noncontributory.  PHYSICAL EXAMINATION: ECOG PERFORMANCE STATUS: 0 - Asymptomatic  Blood pressure 136/96, pulse 90, temperature 98.1 F (36.7 C), temperature source Oral, resp. rate 18, weight 198 lb (89.812  kg), SpO2 98.00%.  GENERAL:alert, no distress and comfortable. Moderately obese. SKIN: skin color, texture, turgor are normal, no  rashes or significant lesions EYES: PERLA; Conjunctiva are pink and non-injected, sclera clear SINUSES: No redness or tenderness over maxillary or ethmoid sinuses OROPHARYNX:no exudate, no erythema on lips, buccal mucosa, or tongue. NECK: supple, thyroid normal size, non-tender, without nodularity. No masses CHEST: Status post left breast lumpectomy with hyperpigmentation changes of radiation. Breasts are pendulous. No masses are felt. LYMPH:  no palpable lymphadenopathy in the cervical, axillary or inguinal LUNGS: clear to auscultation and percussion with normal breathing effort HEART: regular rate & rhythm and no murmurs. ABDOMEN:abdomen soft, non-tender and normal bowel sounds MUSCULOSKELETAL:no cyanosis of digits and no clubbing. Range of motion normal.  NEURO: alert & oriented x 3 with fluent speech, no focal motor/sensory deficits   LABORATORY DATA: Office Visit on 01/26/2014  Component Date Value Ref Range Status  . WBC 01/26/2014 9.7  4.0 - 10.5 K/uL Final  . RBC 01/26/2014 4.48  3.87 - 5.11 MIL/uL Final  . Hemoglobin 01/26/2014 13.9  12.0 - 15.0 g/dL Final  . HCT 01/26/2014 40.9  36.0 - 46.0 % Final  . MCV 01/26/2014 91.3  78.0 - 100.0 fL Final  . MCH 01/26/2014 31.0  26.0 - 34.0 pg Final  . MCHC 01/26/2014 34.0  30.0 - 36.0 g/dL Final  . RDW 01/26/2014 13.8  11.5 - 15.5 % Final  . Platelets 01/26/2014 448* 150 - 400 K/uL Final  . Neutrophils Relative % 01/26/2014 46  43 - 77 % Final  . Neutro Abs 01/26/2014 4.5  1.7 - 7.7 K/uL Final  . Lymphocytes Relative 01/26/2014 45  12 - 46 % Final  . Lymphs Abs 01/26/2014 4.3* 0.7 - 4.0 K/uL Final  . Monocytes Relative 01/26/2014 6  3 - 12 % Final  . Monocytes Absolute 01/26/2014 0.6  0.1 - 1.0 K/uL Final  . Eosinophils Relative 01/26/2014 3  0 - 5 % Final  . Eosinophils Absolute 01/26/2014 0.3  0.0 - 0.7 K/uL Final  . Basophils Relative 01/26/2014 0  0 - 1 % Final  . Basophils Absolute 01/26/2014 0.0  0.0 - 0.1 K/uL Final  .  Sodium 01/26/2014 138  137 - 147 mEq/L Final  . Potassium 01/26/2014 3.6* 3.7 - 5.3 mEq/L Final  . Chloride 01/26/2014 100  96 - 112 mEq/L Final  . CO2 01/26/2014 26  19 - 32 mEq/L Final  . Glucose, Bld 01/26/2014 85  70 - 99 mg/dL Final  . BUN 01/26/2014 14  6 - 23 mg/dL Final  . Creatinine, Ser 01/26/2014 0.64  0.50 - 1.10 mg/dL Final  . Calcium 01/26/2014 9.7  8.4 - 10.5 mg/dL Final  . Total Protein 01/26/2014 7.5  6.0 - 8.3 g/dL Final  . Albumin 01/26/2014 3.8  3.5 - 5.2 g/dL Final  . AST 01/26/2014 18  0 - 37 U/L Final  . ALT 01/26/2014 19  0 - 35 U/L Final  . Alkaline Phosphatase 01/26/2014 79  39 - 117 U/L Final  . Total Bilirubin 01/26/2014 0.2* 0.3 - 1.2 mg/dL Final  . GFR calc non Af Amer 01/26/2014 >90  >90 mL/min Final  . GFR calc Af Amer 01/26/2014 >90  >90 mL/min Final   Comment: (NOTE)                          The eGFR has been calculated using the CKD EPI equation.  This calculation has not been validated in all clinical situations.                          eGFR's persistently <90 mL/min signify possible Chronic Kidney                          Disease.  . CEA 01/26/2014 0.7  0.0 - 5.0 ng/mL Final   Performed at [facility]  . CA 27.29 01/26/2014 20  0 - 39 U/mL Final   Performed at [facility]:  ER/PR negative, HER-2/neu overexpressed, 2 positive nodes out of 11 with surgery on 10/18/2004. Ki-67 was low at 7%  Urinalysis    Component Value Date/Time   COLORURINE YELLOW 12/25/2011 [place] 12/25/2011 1127   LABSPEC 1.022 12/25/2011 1127   PHURINE 7.5 12/25/2011 1127   GLUCOSEU NEG 12/25/2011 1127   HGBUR NEG 12/25/2011 1127   HGBUR large 06/02/2009 0849   BILIRUBINUR negative 12/25/2011 1130   BILIRUBINUR NEG 12/25/2011 1127   KETONESUR NEG 12/25/2011 1127   PROTEINUR negative 12/25/2011 1130   PROTEINUR NEG 12/25/2011 1127   UROBILINOGEN 0.2 12/25/2011 1130   UROBILINOGEN 0.2 12/25/2011 1127   NITRITE negative  12/25/2011 1130   NITRITE NEG 12/25/2011 1127   LEUKOCYTESUR Negative 12/25/2011 1130    RADIOGRAPHIC STUDIES: MM Digital Diagnostic Bilat Status: Final result            Study Result    *RADIOLOGY REPORT*  Clinical Data: History of left breast cancer status post  lumpectomy 2006.  DIGITAL DIAGNOSTIC BILATERAL MAMMOGRAM WITH CAD  Comparison: With priors  Findings:  ACR Breast Density Category b: There are scattered areas of  fibroglandular density.  There is no new suspicious mass or malignant-type  microcalcifications in either breast. Stable lumpectomy changes,  are seen in the left breast.  Mammographic images were processed with CAD.  IMPRESSION:  No evidence of malignancy in either breast.  RECOMMENDATION:  Bilateral diagnostic mammogram in 1 year is recommended.  I have discussed the findings and recommendations with the patient.  Results were also provided in writing at the conclusion of the  visit. If applicable, a reminder letter will be sent to the  patient regarding her next appointment.  BI-RADS CATEGORY 2: Benign finding(s).  Original Report Authenticated      ASSESSMENT:  #1. Stage II left breast cancer status post lumpectomy and axillary dissection followed by chemotherapy, radiotherapy for ER/PR negative disease, one year of trastuzumab, no evidence of disease pending today's lab reports. #2. Hypertension, on treatment.   PLAN:  #1. Continue self breast examination monthly. #2. Mammographic ordered by her family physician in July 2015. #3. Followup in one year with physical exam and lab tests. Patient was encouraged to call should any new symptoms occur that are troublesome and persistent so that intervention could be implemented.   All questions were answered. The patient knows to call the clinic with any problems, questions or concerns. We can certainly see the patient much sooner if necessary.   I spent 25 minutes counseling the patient face to  face. The total time spent in the appointment was 30 minutes.    Farrel Gobble, MD 01/27/2014 6:36 AM  DISCLAIMER:  This note was dictated with voice recognition software.  Similar sounding words can inadvertently be transcribed inaccurately and may not be corrected upon review.

## 2014-01-27 LAB — CEA: CEA: 0.7 ng/mL (ref 0.0–5.0)

## 2014-01-27 LAB — CANCER ANTIGEN 27.29: CA 27.29: 20 U/mL (ref 0–39)

## 2014-03-02 ENCOUNTER — Ambulatory Visit: Payer: 59 | Admitting: Family Medicine

## 2014-03-12 ENCOUNTER — Other Ambulatory Visit: Payer: Self-pay | Admitting: Family Medicine

## 2014-03-12 DIAGNOSIS — Z139 Encounter for screening, unspecified: Secondary | ICD-10-CM

## 2014-03-23 ENCOUNTER — Other Ambulatory Visit: Payer: Self-pay | Admitting: Family Medicine

## 2014-04-06 ENCOUNTER — Ambulatory Visit (INDEPENDENT_AMBULATORY_CARE_PROVIDER_SITE_OTHER): Payer: 59 | Admitting: Family Medicine

## 2014-04-06 ENCOUNTER — Encounter (INDEPENDENT_AMBULATORY_CARE_PROVIDER_SITE_OTHER): Payer: Self-pay

## 2014-04-06 ENCOUNTER — Encounter: Payer: Self-pay | Admitting: Family Medicine

## 2014-04-06 VITALS — BP 132/90 | HR 100 | Resp 18 | Ht 62.0 in | Wt 197.0 lb

## 2014-04-06 DIAGNOSIS — F17200 Nicotine dependence, unspecified, uncomplicated: Secondary | ICD-10-CM

## 2014-04-06 DIAGNOSIS — R7309 Other abnormal glucose: Secondary | ICD-10-CM

## 2014-04-06 DIAGNOSIS — E669 Obesity, unspecified: Secondary | ICD-10-CM

## 2014-04-06 DIAGNOSIS — E8881 Metabolic syndrome: Secondary | ICD-10-CM

## 2014-04-06 DIAGNOSIS — R7303 Prediabetes: Secondary | ICD-10-CM

## 2014-04-06 DIAGNOSIS — F17208 Nicotine dependence, unspecified, with other nicotine-induced disorders: Secondary | ICD-10-CM

## 2014-04-06 DIAGNOSIS — E785 Hyperlipidemia, unspecified: Secondary | ICD-10-CM

## 2014-04-06 DIAGNOSIS — F19988 Other psychoactive substance use, unspecified with other psychoactive substance-induced disorder: Secondary | ICD-10-CM

## 2014-04-06 DIAGNOSIS — J309 Allergic rhinitis, unspecified: Secondary | ICD-10-CM

## 2014-04-06 DIAGNOSIS — I10 Essential (primary) hypertension: Secondary | ICD-10-CM

## 2014-04-06 MED ORDER — PHENTERMINE HCL 37.5 MG PO TABS: 37.5000 mg | ORAL_TABLET | Freq: Every day | ORAL | Status: DC

## 2014-04-06 NOTE — Patient Instructions (Addendum)
F/u in 4 month, call if you need me before  Labs today, HBa1C, chem7, TSH   New is phentermine half daily to help with weight loss, regular exercise and change in foot choice and portion control are vital  Still concerned about BP  Depend on tylenol 325mg  or 500 mg daily as needed, for right foot pain, pls limit ibuprofen  So 30 tablets last 6 months, new warning re heart safety  GOAL is 6  Pounds weight loss in next 4 monhths

## 2014-04-06 NOTE — Progress Notes (Signed)
Subjective:    Patient ID: [name] [name], female    DOB: [birth year], 53 y.o.   MRN: [id number]  HPI The PT is here for follow up and re-evaluation of chronic medical conditions, medication management and review of any available recent lab and radiology data.  Preventive health is updated, specifically  Cancer screening and Immunization.   Questions or concerns regarding consultations or procedures which the PT has had in the interim are  addressed. The PT denies any adverse reactions to current medications since the last visit.  Concerned re lack of weight loss, wants help with this, wants to resume low dose phentermine       Review of Systems See HPI Denies recent fever or chills. Denies sinus pressure, nasal congestion, ear pain or sore throat. Denies chest congestion, productive cough or wheezing. Denies chest pains, palpitations and leg swelling Denies abdominal pain, nausea, vomiting,diarrhea or constipation.   Denies dysuria, frequency, hesitancy or incontinence. Denies joint pain, swelling and limitation in mobility. Denies headaches, seizures, numbness, or tingling. Denies depression, anxiety or insomnia. Denies skin break down or rash.        Objective:   Physical Exam BP 132/90  Pulse 100  Resp 18  Ht 5\' 2"  (1.575 m)  Wt 197 lb 0.6 oz (89.377 kg)  BMI 36.03 kg/m2  SpO2 95% Patient alert and oriented and in no cardiopulmonary distress.  HEENT: No facial asymmetry, EOMI,   oropharynx pink and moist.  Neck supple no JVD, no mass.  Chest: Clear to auscultation bilaterally.  CVS: S1, S2 no murmurs, no S3.Regular rate.  ABD: Soft non tender.   Ext: No edema  MS: Adequate ROM spine, shoulders, hips and knees.  Skin: Intact, no ulcerations or rash noted.  Psych: Good eye contact, normal affect. Memory intact not anxious or depressed appearing.  CNS: CN 2-12 intact, power,  normal throughout.no focal deficits noted.        Assessment & Plan:    HYPERTENSION Diastolic still elevated DASH diet and commitment to daily physical activity for a minimum of 30 minutes discussed and encouraged, as a part of hypertension management. The importance of attaining a healthy weight is also discussed.  No change in med dose, d/c ibuprofen on regular basis also  ALLERGIC RHINITIS CAUSE UNSPECIFIED Controlled, no change in medication Using double therapy daily  Prediabetes Patient educated about the importance of limiting  Carbohydrate intake , the need to commit to daily physical activity for a minimum of 30 minutes , and to commit weight loss. The fact that changes in all these areas will reduce or eliminate all together the development of diabetes is stressed.   Improved Updated lab needed at/ before next visit.   HYPERLIPIDEMIA Updated lab needed at/ before next visit. Hyperlipidemia:Low fat diet discussed and encouraged.    Metabolic syndrome X The increased risk of cardiovascular disease associated with this diagnosis, and the need to consistently work on lifestyle to change this is discussed. Following  a  heart healthy diet ,commitment to 30 minutes of exercise at least 5 days per week, as well as control of blood sugar and cholesterol , and achieving a healthy weight are all the areas to be addressed .   Nicotine dependence Unchanged. Patient counseled for approximately 5 minutes regarding the health risks of ongoing nicotine use, specifically all types of cancer, heart disease, stroke and respiratory failure. The options available for help with cessation ,the behavioral changes to assist the process, and the option  to either gradully reduce usage  Or abruptly stop.is also discussed. Pt is also encouraged to set specific goals in number of cigarettes used daily, as well as to set a quit date.   OBESITY Unchanged. Patient re-educated about  the importance of commitment to a  minimum of 150 minutes of exercise per week. The  importance of healthy food choices with portion control discussed. Encouraged to start a food diary, count calories and to consider  joining a support group. Sample diet sheets offered. Goals set by the patient for the next several months. Resume phentermine half tab daily

## 2014-04-07 LAB — TSH: TSH: 1.187 u[IU]/mL (ref 0.350–4.500)

## 2014-04-07 LAB — HEMOGLOBIN A1C
Hgb A1c MFr Bld: 5.9 % — ABNORMAL HIGH (ref <5.7)
Mean Plasma Glucose: 123 mg/dL — ABNORMAL HIGH (ref <117)

## 2014-04-07 LAB — BASIC METABOLIC PANEL
BUN: 11 mg/dL (ref 6–23)
CHLORIDE: 103 meq/L (ref 96–112)
CO2: 27 mEq/L (ref 19–32)
Calcium: 10 mg/dL (ref 8.4–10.5)
Creat: 0.65 mg/dL (ref 0.50–1.10)
GLUCOSE: 76 mg/dL (ref 70–99)
POTASSIUM: 4.1 meq/L (ref 3.5–5.3)
SODIUM: 140 meq/L (ref 135–145)

## 2014-04-20 ENCOUNTER — Ambulatory Visit (HOSPITAL_COMMUNITY)
Admission: RE | Admit: 2014-04-20 | Discharge: 2014-04-20 | Disposition: A | Discharge status: Home / Self Care | Payer: 59 | Source: Ambulatory Visit | Attending: Family Medicine | Admitting: Family Medicine

## 2014-04-20 DIAGNOSIS — Z923 Personal history of irradiation: Secondary | ICD-10-CM | POA: Insufficient documentation

## 2014-04-20 DIAGNOSIS — Z139 Encounter for screening, unspecified: Secondary | ICD-10-CM

## 2014-04-20 DIAGNOSIS — Z9221 Personal history of antineoplastic chemotherapy: Secondary | ICD-10-CM | POA: Insufficient documentation

## 2014-04-20 DIAGNOSIS — Z1231 Encounter for screening mammogram for malignant neoplasm of breast: Secondary | ICD-10-CM | POA: Insufficient documentation

## 2014-06-08 NOTE — Assessment & Plan Note (Signed)
Diastolic still elevated DASH diet and commitment to daily physical activity for a minimum of 30 minutes discussed and encouraged, as a part of hypertension management. The importance of attaining a healthy weight is also discussed.  No change in med dose, d/c ibuprofen on regular basis also

## 2014-06-08 NOTE — Assessment & Plan Note (Signed)
Patient educated about the importance of limiting  Carbohydrate intake , the need to commit to daily physical activity for a minimum of 30 minutes , and to commit weight loss. The fact that changes in all these areas will reduce or eliminate all together the development of diabetes is stressed.   Improved Updated lab needed at/ before next visit.

## 2014-06-08 NOTE — Assessment & Plan Note (Signed)
Updated lab needed at/ before next visit. Hyperlipidemia:Low fat diet discussed and encouraged.   

## 2014-06-08 NOTE — Assessment & Plan Note (Signed)
Unchanged. Patient re-educated about  the importance of commitment to a  minimum of 150 minutes of exercise per week. The importance of healthy food choices with portion control discussed. Encouraged to start a food diary, count calories and to consider  joining a support group. Sample diet sheets offered. Goals set by the patient for the next several months. Resume phentermine half tab daily

## 2014-06-08 NOTE — Assessment & Plan Note (Signed)
The increased risk of cardiovascular disease associated with this diagnosis, and the need to consistently work on lifestyle to change this is discussed. Following  a  heart healthy diet ,commitment to 30 minutes of exercise at least 5 days per week, as well as control of blood sugar and cholesterol , and achieving a healthy weight are all the areas to be addressed .  

## 2014-06-08 NOTE — Assessment & Plan Note (Signed)
Unchanged. Patient counseled for approximately 5 minutes regarding the health risks of ongoing nicotine use, specifically all types of cancer, heart disease, stroke and respiratory failure. The options available for help with cessation ,the behavioral changes to assist the process, and the option to either gradully reduce usage  Or abruptly stop.is also discussed. Pt is also encouraged to set specific goals in number of cigarettes used daily, as well as to set a quit date.  

## 2014-06-08 NOTE — Assessment & Plan Note (Signed)
Controlled, no change in medication Using double therapy daily

## 2014-07-02 ENCOUNTER — Other Ambulatory Visit: Payer: Self-pay | Admitting: Family Medicine

## 2014-07-09 ENCOUNTER — Other Ambulatory Visit: Payer: Self-pay

## 2014-07-09 DIAGNOSIS — J309 Allergic rhinitis, unspecified: Secondary | ICD-10-CM

## 2014-07-09 MED ORDER — FLUTICASONE PROPIONATE 50 MCG/ACT NA SUSP: 2.0000 | Freq: Every day | NASAL | Status: DC

## 2014-08-10 ENCOUNTER — Encounter: Payer: Self-pay | Admitting: Family Medicine

## 2014-08-10 ENCOUNTER — Ambulatory Visit (INDEPENDENT_AMBULATORY_CARE_PROVIDER_SITE_OTHER): Payer: 59 | Admitting: Family Medicine

## 2014-08-10 VITALS — BP 138/84 | HR 85 | Resp 16 | Ht 62.0 in | Wt 200.4 lb

## 2014-08-10 DIAGNOSIS — R7309 Other abnormal glucose: Secondary | ICD-10-CM

## 2014-08-10 DIAGNOSIS — IMO0001 Reserved for inherently not codable concepts without codable children: Secondary | ICD-10-CM

## 2014-08-10 DIAGNOSIS — E785 Hyperlipidemia, unspecified: Secondary | ICD-10-CM

## 2014-08-10 DIAGNOSIS — R7303 Prediabetes: Secondary | ICD-10-CM

## 2014-08-10 DIAGNOSIS — E669 Obesity, unspecified: Secondary | ICD-10-CM

## 2014-08-10 DIAGNOSIS — E8881 Metabolic syndrome: Secondary | ICD-10-CM

## 2014-08-10 DIAGNOSIS — J3089 Other allergic rhinitis: Secondary | ICD-10-CM

## 2014-08-10 DIAGNOSIS — F17208 Nicotine dependence, unspecified, with other nicotine-induced disorders: Secondary | ICD-10-CM

## 2014-08-10 DIAGNOSIS — I10 Essential (primary) hypertension: Secondary | ICD-10-CM

## 2014-08-10 MED ORDER — PHENTERMINE HCL 37.5 MG PO TABS: 37.5000 mg | ORAL_TABLET | Freq: Every day | ORAL | Status: DC

## 2014-08-10 NOTE — Progress Notes (Signed)
Subjective:    Patient ID: [name] [name], female    DOB: 05-02-1961, 53 y.o.   MRN: [id number]  HPI The PT is here for follow up and re-evaluation of chronic medical conditions, medication management and review of any available recent lab and radiology data.  Preventive health is updated, specifically  Cancer screening and Immunization.   Questions or concerns regarding consultations or procedures which the PT has had in the interim are  addressed. The PT denies any adverse reactions to current medications since the last visit.  There are no new concerns.  C/o sinus pressure and clear drainage for past 1 week also right ear pressure    Review of Systems See HPI Denies recent fever or chills.  Denies chest congestion, productive cough or wheezing. Denies chest pains, palpitations and leg swelling Denies abdominal pain, nausea, vomiting,diarrhea or constipation.   Denies dysuria, frequency, hesitancy or incontinence. Denies joint pain, swelling and limitation in mobility. Denies headaches, seizures, numbness, or tingling. Denies depression, anxiety or insomnia. Denies skin break down or rash.        Objective:   Physical Exam BP 138/84 mmHg  Pulse 85  Resp 16  Ht 5\' 2"  (1.575 m)  Wt 200 lb 6.4 oz (90.901 kg)  BMI 36.64 kg/m2  SpO2 97% Patient alert and oriented and in no cardiopulmonary distress.  HEENT: No facial asymmetry, EOMI,   oropharynx pink and moist.  Neck supple no JVD, no mass. TM slightly dull, not erythematous Chest: Clear to auscultation bilaterally.  CVS: S1, S2 no murmurs, no S3.Regular rate.  ABD: Soft non tender.   Ext: No edema  MS: Adequate ROM spine, shoulders, hips and knees.  Skin: Intact, no ulcerations or rash noted.  Psych: Good eye contact, normal affect. Memory intact not anxious or depressed appearing.  CNS: CN 2-12 intact, power,  normal throughout.no focal deficits noted.        Assessment & Plan:  Essential  hypertension Controlled, no change in medication DASH diet and commitment to daily physical activity for a minimum of 30 minutes discussed and encouraged, as a part of hypertension management. The importance of attaining a healthy weight is also discussed.   Hyperlipemia Hyperlipidemia:Low fat diet discussed and encouraged.  Updated lab needed    Metabolic syndrome X The increased risk of cardiovascular disease associated with this diagnosis, and the need to consistently work on lifestyle to change this is discussed. Following  a  heart healthy diet ,commitment to 30 minutes of exercise at least 5 days per week, as well as control of blood sugar and cholesterol , and achieving a healthy weight are all the areas to be addressed .   Nicotine dependence Patient counseled for approximately 5 minutes regarding the health risks of ongoing nicotine use, specifically all types of cancer, heart disease, stroke and respiratory failure. The options available for help with cessation ,the behavioral changes to assist the process, and the option to either gradully reduce usage  Or abruptly stop.is also discussed. Pt is also encouraged to set specific goals in number of cigarettes used daily, as well as to set a quit date.   Obesity, Class II, BMI 35-39.9, with comorbidity Unchanged. Patient re-educated about  the importance of commitment to a  minimum of 150 minutes of exercise per week. The importance of healthy food choices with portion control discussed. Encouraged to start a food diary, count calories and to consider  joining a support group. Sample diet sheets offered. Goals set  by the patient for the next several months.     Allergic rhinitis Increased symptoms in past 1 top 2 weeks, no symptoms or signs of infection Pt to start saline flushes, consider changing to zyrtec and advised once daily as needed, sudafed for excess drainage, Tylenol top be used for headache , 2 given in  office

## 2014-08-10 NOTE — Assessment & Plan Note (Signed)
Increased symptoms in past 1 top 2 weeks, no symptoms or signs of infection Pt to start saline flushes, consider changing to zyrtec and advised once daily as needed, sudafed for excess drainage, Tylenol top be used for headache , 2 given in office

## 2014-08-10 NOTE — Assessment & Plan Note (Signed)
Controlled, no change in medication DASH diet and commitment to daily physical activity for a minimum of 30 minutes discussed and encouraged, as a part of hypertension management. The importance of attaining a healthy weight is also discussed.  

## 2014-08-10 NOTE — Patient Instructions (Addendum)
F/u in 53month, call if you need me before  Fasting lipid, chem 7 hBA1C today  It is important that you exercise regularly at least 30 minutes 5 times a week. If you develop chest pain, have severe difficulty breathing, or feel very tired, stop exercising immediately and seek medical attention   The patient is asked to make an attempt to improve diet and exercise patterns to aid in medical management of this problem.   Weight loss goal of 2.5 to 3 pounds per month  Continue phentermine half daily  New for sinus pressure, saline flushes, sudafed one daily as needed, and tylenol 325 mg for headache  Pls stop ibuprofen  Pls work on smoking cessation

## 2014-08-10 NOTE — Assessment & Plan Note (Signed)
The increased risk of cardiovascular disease associated with this diagnosis, and the need to consistently work on lifestyle to change this is discussed. Following  a  heart healthy diet ,commitment to 30 minutes of exercise at least 5 days per week, as well as control of blood sugar and cholesterol , and achieving a healthy weight are all the areas to be addressed .  

## 2014-08-10 NOTE — Assessment & Plan Note (Signed)

## 2014-08-10 NOTE — Assessment & Plan Note (Signed)
Hyperlipidemia:Low fat diet discussed and encouraged.  Updated lab needed 

## 2014-08-10 NOTE — Assessment & Plan Note (Signed)
Unchanged. Patient re-educated about  the importance of commitment to a  minimum of 150 minutes of exercise per week. The importance of healthy food choices with portion control discussed. Encouraged to start a food diary, count calories and to consider  joining a support group. Sample diet sheets offered. Goals set by the patient for the next several months.    

## 2014-08-11 LAB — BASIC METABOLIC PANEL
BUN: 11 mg/dL (ref 6–23)
CO2: 28 meq/L (ref 19–32)
Calcium: 9.8 mg/dL (ref 8.4–10.5)
Chloride: 104 mEq/L (ref 96–112)
Creat: 0.65 mg/dL (ref 0.50–1.10)
Glucose, Bld: 86 mg/dL (ref 70–99)
POTASSIUM: 4.7 meq/L (ref 3.5–5.3)
SODIUM: 140 meq/L (ref 135–145)

## 2014-08-11 LAB — LIPID PANEL
CHOLESTEROL: 191 mg/dL (ref 0–200)
HDL: 52 mg/dL (ref >39)
LDL Cholesterol: 118 mg/dL — ABNORMAL HIGH (ref 0–99)
TRIGLYCERIDES: 103 mg/dL (ref <150)
Total CHOL/HDL Ratio: 3.7 Ratio
VLDL: 21 mg/dL (ref 0–40)

## 2014-08-11 LAB — HEMOGLOBIN A1C
Hgb A1c MFr Bld: 6.1 % — ABNORMAL HIGH (ref <5.7)
Mean Plasma Glucose: 128 mg/dL — ABNORMAL HIGH (ref <117)

## 2014-10-26 ENCOUNTER — Emergency Department (HOSPITAL_COMMUNITY): Payer: 59

## 2014-10-26 ENCOUNTER — Encounter (HOSPITAL_COMMUNITY): Payer: Self-pay | Admitting: *Deleted

## 2014-10-26 ENCOUNTER — Emergency Department (HOSPITAL_COMMUNITY)
Admission: EM | Admit: 2014-10-26 | Discharge: 2014-10-26 | Disposition: A | Discharge status: Home / Self Care | Payer: 59 | Attending: Emergency Medicine | Admitting: Emergency Medicine

## 2014-10-26 ENCOUNTER — Telehealth: Payer: Self-pay | Admitting: Family Medicine

## 2014-10-26 DIAGNOSIS — I10 Essential (primary) hypertension: Secondary | ICD-10-CM | POA: Insufficient documentation

## 2014-10-26 DIAGNOSIS — R0981 Nasal congestion: Secondary | ICD-10-CM | POA: Diagnosis not present

## 2014-10-26 DIAGNOSIS — Z79899 Other long term (current) drug therapy: Secondary | ICD-10-CM | POA: Diagnosis not present

## 2014-10-26 DIAGNOSIS — E669 Obesity, unspecified: Secondary | ICD-10-CM | POA: Diagnosis not present

## 2014-10-26 DIAGNOSIS — Z7951 Long term (current) use of inhaled steroids: Secondary | ICD-10-CM | POA: Insufficient documentation

## 2014-10-26 DIAGNOSIS — R509 Fever, unspecified: Secondary | ICD-10-CM | POA: Insufficient documentation

## 2014-10-26 DIAGNOSIS — Z720 Tobacco use: Secondary | ICD-10-CM | POA: Diagnosis not present

## 2014-10-26 DIAGNOSIS — G4489 Other headache syndrome: Secondary | ICD-10-CM | POA: Diagnosis not present

## 2014-10-26 DIAGNOSIS — R51 Headache: Secondary | ICD-10-CM | POA: Diagnosis present

## 2014-10-26 DIAGNOSIS — Z8739 Personal history of other diseases of the musculoskeletal system and connective tissue: Secondary | ICD-10-CM | POA: Diagnosis not present

## 2014-10-26 DIAGNOSIS — Z853 Personal history of malignant neoplasm of breast: Secondary | ICD-10-CM | POA: Insufficient documentation

## 2014-10-26 LAB — CBG MONITORING, ED: Glucose-Capillary: 99 mg/dL (ref 70–99)

## 2014-10-26 MED ORDER — ONDANSETRON HCL 4 MG/2ML IJ SOLN
4.0000 mg | Freq: Once | INTRAMUSCULAR | Status: AC
  Administered 2014-10-26: 4 mg via INTRAVENOUS
  Filled 2014-10-26: qty 2

## 2014-10-26 MED ORDER — KETOROLAC TROMETHAMINE 30 MG/ML IJ SOLN
30.0000 mg | Freq: Once | INTRAMUSCULAR | Status: AC
  Administered 2014-10-26: 30 mg via INTRAVENOUS
  Filled 2014-10-26: qty 1

## 2014-10-26 MED ORDER — METOCLOPRAMIDE HCL 5 MG/ML IJ SOLN
10.0000 mg | Freq: Once | INTRAMUSCULAR | Status: AC
  Administered 2014-10-26: 10 mg via INTRAVENOUS
  Filled 2014-10-26: qty 2

## 2014-10-26 NOTE — Discharge Instructions (Signed)

## 2014-10-26 NOTE — ED Notes (Addendum)
Awakened today with headache,  Vomited x 1 , cont to have nausea.  "Felt like I couldn't think".  MAE   Had trouble "finding the right words"   Alert, No distress, nl gait,

## 2014-10-26 NOTE — ED Notes (Signed)
PO fluids given to patient.

## 2014-10-26 NOTE — Telephone Encounter (Signed)
Patient aware.

## 2014-10-26 NOTE — Telephone Encounter (Signed)
When asked about her sinus headache she said she woke up with a bad headache and pressure in her temples and a funny feeling and it was so bad that she "couldn't think" or get her words out to speak so she laid down and tried to sleep it off after taking ibuprofen and sinus pill. Woke up a little while ago and still has the headache but she was talking just fine to me but she wants a scan of her head because this isn't like her. I advised she may need to go to the ER but she said she was fine now other than the headache, she can "talk ok" and "think ok" now but I told her I would talk to Dr and call her back.

## 2014-10-26 NOTE — Telephone Encounter (Signed)
Needs Ed eval

## 2014-10-26 NOTE — ED Provider Notes (Signed)
CSN: [id number]     Arrival date & time 10/26/14  1738 History  This chart was scribed for Sharyon Cable, MD by Chester Holstein, ED Scribe. This patient was seen in room APA02/APA02 and the patient's care was started at 6:45 PM.    Chief Complaint  Patient presents with  . Headache     Patient is a 54 y.o. female presenting with headaches. The history is provided by the patient and a relative. No language interpreter was used.  Headache Pain location:  Frontal Severity currently:  7/10 Severity at highest:  10/10 Onset quality:  Unable to specify (patient woke up with symptoms) Duration:  1 day Timing:  Constant Progression:  Partially resolved Chronicity:  New Similar to prior headaches: no (longer than usual)   Relieved by:  NSAIDs Associated symptoms: blurred vision, congestion, dizziness, fever, nausea and vomiting   Associated symptoms: no abdominal pain, no cough, no hearing loss, no numbness, no syncope, no tingling and no weakness   Congestion:    Location:  Nasal Vomiting:    Emesis appearance: clear.   Severity:  Mild  HPI Comments: TANEA MOGA is a 54 y.o. female with PMHx of HTN, HLD, and adenocarcinoma of breast, who presents to the Emergency Department complaining of 10/10 frontal and sinus headache with onset this morning that has partially resolved to 7/10. Pt notes congestion, blurred vision, dizziness, nausea, and vomiting clear liquid once. Pt states she has been having difficulty composing thoughts but no slurred speech. Pt took ibuprofen for relief. Pt notes she does not get headaches frequently and states she is concerned about the severity and length of this headache.  Pt denies cough, abdominal pain, hearing loss, SOB, chest pain, LOC, weakness and numbness, facial weakness, head injury, and slurred speech.  Denies blindness and denies diplopia.  No h/o CVA and no h/o brain aneurysm per patient  Past Medical History  Diagnosis Date  . Hyperlipidemia    . Obesity   . Heel spur   . Hypertension   . Adenocarcinoma of breast     left    Past Surgical History  Procedure Laterality Date  . Btl  1992  . Cholecystectomy    . Left lumpectomy and lymph node disection  2000  . R heel surgery for spur and chipped bone  11/09/20099  . Tubal ligation    . Colonoscopy  09/15/2011    Procedure: COLONOSCOPY;  Surgeon: Dorothyann Peng, MD;  Location: [facility];  Service: Endoscopy;  Laterality: N/A;  9:15 AM   Family History  Problem Relation Age of Onset  . Heart attack Mother   . Heart attack Father   . Glaucoma Father   . Hypertension Father   . Hypertension Sister   . Diabetes Sister   . Anesthesia problems Neg Hx   . Hypotension Neg Hx   . Malignant hyperthermia Neg Hx   . Pseudochol deficiency Neg Hx    History  Substance Use Topics  . Smoking status: Current Every Day Smoker -- 0.25 packs/day for 15 years    Types: Cigarettes  . Smokeless tobacco: Not on file  . Alcohol Use: No   OB History    No data available     Review of Systems  Constitutional: Positive for fever.  HENT: Positive for congestion. Negative for hearing loss.   Eyes: Positive for blurred vision and visual disturbance.  Respiratory: Negative for cough and shortness of breath.   Cardiovascular: Negative for chest  pain and syncope.  Gastrointestinal: Positive for nausea and vomiting. Negative for abdominal pain.  Neurological: Positive for dizziness and headaches. Negative for syncope, facial asymmetry, speech difficulty, weakness and numbness.  All other systems reviewed and are negative.     Allergies  Ace inhibitors  Home Medications   Prior to Admission medications   Medication Sig Start Date End Date Taking? Authorizing Provider  fluticasone (FLONASE) 50 MCG/ACT nasal spray Place 2 sprays into both nostrils daily. 07/09/14 07/09/15  Fayrene Helper, MD  hydrochlorothiazide (HYDRODIURIL) 25 MG tablet TAKE 1 TABLET BY MOUTH DAILY 07/02/14    Fayrene Helper, MD  loratadine (CLARITIN) 10 MG tablet TAKE 1 TABLET BY MOUTH DAILY. 01/05/14   Fayrene Helper, MD  Multiple Vitamin (MULTIVITAMIN) tablet Take 1 tablet by mouth daily.    Historical Provider, MD  phentermine (ADIPEX-P) 37.5 MG tablet Take 1 tablet (37.5 mg total) by mouth daily before breakfast. 08/10/14   Fayrene Helper, MD  potassium chloride SA (K-DUR,KLOR-CON) 20 MEQ tablet TAKE 1 TABLET BY MOUTH ONCE DAILY 10/10/13   Fayrene Helper, MD   BP 138/89 mmHg  Pulse 104  Temp(Src) 99.9 F (37.7 C) (Oral)  Resp 18  Ht 5\' 2"  (1.575 m)  Wt 185 lb (83.915 kg)  BMI 33.83 kg/m2  SpO2 100% Physical Exam  Nursing note and vitals reviewed.  CONSTITUTIONAL: Well developed/well nourished HEAD: Normocephalic/atraumatic EYES: EOMI/PERRL, no nystagmus, no ptosis,  ENMT: Mucous membranes moist NECK: supple no meningeal signs, no bruits SPINE/BACK:entire spine nontender CV: S1/S2 noted, no murmurs/rubs/gallops noted LUNGS: Lungs are clear to auscultation bilaterally, no apparent distress ABDOMEN: soft, nontender, no rebound or guarding GU:no cva tenderness NEURO:Awake/alert, facies symmetric, no arm or leg drift is noted Equal 5/5 strength with shoulder abduction, elbow flex/extension, wrist flex/extension in upper extremities and equal hand grips bilaterally Equal 5/5 strength with hip flexion,knee flex/extension, foot dorsi/plantar flexion Cranial nerves 3/4/5/6/04/02/09/11/12 tested and intact Gait normal without ataxia No past pointing Sensation to light touch intact in all extremities EXTREMITIES: pulses normal, full ROM SKIN: warm, color normal PSYCH: no abnormalities of mood noted, alert and oriented to situation    ED Course  Procedures  DIAGNOSTIC STUDIES: Oxygen Saturation is 100% on room air, normal by my interpretation.    COORDINATION OF CARE: 6:56 PM Discussed treatment plan with patient at beside, the patient agrees with the plan and has no  further questions at this time.  when discussing treatment ,daughter requests further testing.  Initially requested CT head - I advised that she unlikely has any acute neuro event (doubt SAH/CVA/meningitis) and that CT head is limited in detecting all causes of HA.  Daughter then demanded emergent MRI for her headache.  I explained at this time that given history/exam (pt smiling, no neuro deficits, watching TV, reports HA improved) that MRI is not indicated at this time.  When asked what she would like, patient looks to daughter to answer all questions.  Daughter demands CT head. 8:18 PM CT head negative Pt reports improved She is smiling and in no distress I doubt SAH/CVA/meningitis I feel she is appropriate/stable for d/c and PCP followup Discussed strict return precautions Labs Review Labs Reviewed  CBG MONITORING, ED    Imaging Review Ct Head Wo Contrast  10/26/2014   CLINICAL DATA:  Initial encounter for headache. Breast cancer. Hypertension.  EXAM: CT HEAD WITHOUT CONTRAST  TECHNIQUE: Contiguous axial images were obtained from the base of the skull through the vertex without intravenous  contrast.  COMPARISON:  06/23/2005  FINDINGS: Sinuses/Soft tissues: Clear paranasal sinuses and mastoid air cells.  Intracranial: Moderate low density in the periventricular white matter likely related to small vessel disease. This is similar. Most prevalent within the parietal lobes. No mass lesion, hemorrhage, hydrocephalus, acute infarct, intra-axial, or extra-axial fluid collection.  IMPRESSION: 1.  No acute intracranial abnormality. 2. Moderate small vessel ischemic change.   Electronically Signed   By: Abigail Miyamoto M.D.   On: 10/26/2014 20:03     Medications  metoCLOPramide (REGLAN) injection 10 mg (not administered)    MDM   Final diagnoses:  Other headache syndrome    Nursing notes including past medical history and social history reviewed and considered in documentation Labs/vital  reviewed myself and considered during evaluation Previous records reviewed and considered - phone records reviewed from earlier today   I personally performed the services described in this documentation, which was scribed in my presence. The recorded information has been reviewed and is accurate.       Sharyon Cable, MD 10/26/14 2020

## 2014-10-27 ENCOUNTER — Encounter: Payer: Self-pay | Admitting: Family Medicine

## 2014-10-27 ENCOUNTER — Ambulatory Visit (INDEPENDENT_AMBULATORY_CARE_PROVIDER_SITE_OTHER): Payer: 59 | Admitting: Family Medicine

## 2014-10-27 VITALS — BP 120/82 | HR 86 | Resp 18 | Ht 62.0 in | Wt 194.0 lb

## 2014-10-27 DIAGNOSIS — G4489 Other headache syndrome: Secondary | ICD-10-CM

## 2014-10-27 DIAGNOSIS — F17208 Nicotine dependence, unspecified, with other nicotine-induced disorders: Secondary | ICD-10-CM

## 2014-10-27 DIAGNOSIS — R519 Headache, unspecified: Secondary | ICD-10-CM

## 2014-10-27 DIAGNOSIS — I10 Essential (primary) hypertension: Secondary | ICD-10-CM

## 2014-10-27 DIAGNOSIS — R0989 Other specified symptoms and signs involving the circulatory and respiratory systems: Secondary | ICD-10-CM

## 2014-10-27 DIAGNOSIS — R51 Headache: Secondary | ICD-10-CM

## 2014-10-27 MED ORDER — LORAZEPAM 1 MG PO TABS: ORAL_TABLET | ORAL | Status: DC

## 2014-10-27 MED ORDER — BUTALBITAL-APAP-CAFFEINE 50-325-40 MG PO TABS: 2.0000 | ORAL_TABLET | Freq: Two times a day (BID) | ORAL | Status: DC | PRN

## 2014-10-27 NOTE — Progress Notes (Signed)
   Subjective:    Patient ID: [name] [name], female    DOB: 06/13/1961, 54 y.o.   MRN: [id number]  HPI Pt awoke yesterday am with a 10 plus frontal headache extending to mid crown , never had one like this before, no headache history Pain lasted over 6 hrs , only relieved after ED visit when medication was administered IV Pain was accompanied by nausea, she vomited once, no aggravation by light or sound noted, pt had difficulty selecting  Appropriate words as well as expressing herself, denies any weakness or numbness, no difficulty swallowing , no vision disturbance She denies any recent febrile illness or mylagia No known f/h of brain tumor or aneurysm or migraine   Review of Systems See HPI Denies recent fever or chills. Denies sinus pressure, nasal congestion, ear pain or sore throat. Denies chest congestion, productive cough or wheezing. Denies chest pains, palpitations and leg swelling Denies abdominal pain, nausea, vomiting,diarrhea or constipation.   Denies dysuria, frequency, hesitancy or incontinence. Denies joint pain, swelling and limitation in mobility. gling. Denies depression, anxiety or insomnia. Denies skin break down or rash.        Objective:   Physical Exam BP 120/82 mmHg  Pulse 86  Resp 18  Ht 5\' 2"  (1.575 m)  Wt 194 lb 0.6 oz (88.016 kg)  BMI 35.48 kg/m2  SpO2 94% Patient alert and oriented and in no cardiopulmonary distress.  HEENT: No facial asymmetry, EOMI,   oropharynx pink and moist.  Neck supple no JVD, no mass. pERTL , Bruit Chest: Clear to auscultation bilaterally.  CVS: S1, S2 no murmurs, no S3.Regular rate.  ABD: Soft non tender.   Ext: No edema  MS: Adequate ROM spine, shoulders, hips and knees.  Skin: Intact, no ulcerations or rash noted.  Psych: Good eye contact, normal affect. Memory intact not anxious or depressed appearing.  CNS: CN 2-12 intact, power,  normal throughout.no focal deficits noted.        Assessment &  Plan:  Headache New disabling headache with expressive aphasia, brain scan orderd, and f/u with neurology   Carotid bruit Carotid US to evaluate stenosis, pt had recent episode of expressive aphasia accompanying severe headache   Essential hypertension .,con1 DASH diet and commitment to daily physical activity for a minimum of 30 minutes discussed and encouraged, as a part of hypertension management. The importance of attaining a healthy weight is also discussed.    Nicotine dependence Unchanged Patient counseled for approximately 5 minutes regarding the health risks of ongoing nicotine use, specifically all types of cancer, heart disease, stroke and respiratory failure. The options available for help with cessation ,the behavioral changes to assist the process, and the option to either gradully reduce usage  Or abruptly stop.is also discussed. Pt is also encouraged to set specific goals in number of cigarettes used daily, as well as to set a quit date. '

## 2014-10-27 NOTE — Patient Instructions (Addendum)
F/u as before  You are referred for a carotid doppler study and also for a brain scan.  You are also referred to neurologist, on a Monday per your request  New medcation fioricet tablets are prescribed for use if needed, for severe headache  FMLA  Form may be completed in case you have severe headache recurrence

## 2014-10-28 ENCOUNTER — Other Ambulatory Visit: Payer: Self-pay

## 2014-10-28 MED ORDER — BUTALBITAL-APAP-CAFFEINE 50-325-40 MG PO TABS: 2.0000 | ORAL_TABLET | Freq: Two times a day (BID) | ORAL | Status: DC | PRN

## 2014-11-01 NOTE — Assessment & Plan Note (Signed)
,  con1 DASH diet and commitment to daily physical activity for a minimum of 30 minutes discussed and encouraged, as a part of hypertension management. The importance of attaining a healthy weight is also discussed.  

## 2014-11-01 NOTE — Assessment & Plan Note (Signed)
New disabling headache with expressive aphasia, brain scan orderd, and f/u with neurology

## 2014-11-01 NOTE — Assessment & Plan Note (Signed)
Unchanged. Patient counseled for approximately 5 minutes regarding the health risks of ongoing nicotine use, specifically all types of cancer, heart disease, stroke and respiratory failure. The options available for help with cessation ,the behavioral changes to assist the process, and the option to either gradully reduce usage  Or abruptly stop.is also discussed. Pt is also encouraged to set specific goals in number of cigarettes used daily, as well as to set a quit date.  

## 2014-11-01 NOTE — Assessment & Plan Note (Signed)
Carotid US to evaluate stenosis, pt had recent episode of expressive aphasia accompanying severe headache

## 2014-11-02 ENCOUNTER — Other Ambulatory Visit: Payer: Self-pay | Admitting: Family Medicine

## 2014-11-03 ENCOUNTER — Ambulatory Visit (HOSPITAL_COMMUNITY)
Admission: RE | Admit: 2014-11-03 | Discharge: 2014-11-03 | Disposition: A | Discharge status: Home / Self Care | Payer: 59 | Source: Ambulatory Visit | Attending: Family Medicine | Admitting: Family Medicine

## 2014-11-03 ENCOUNTER — Telehealth: Payer: Self-pay | Admitting: *Deleted

## 2014-11-03 DIAGNOSIS — R0989 Other specified symptoms and signs involving the circulatory and respiratory systems: Secondary | ICD-10-CM | POA: Insufficient documentation

## 2014-11-03 NOTE — Telephone Encounter (Signed)
Pt called stating she has a doctors appt today at 3:45 and 2/10 at 3:45 and pt said she has 2 pills she needs to take before going to the appt pt is not sure when she needs to take the pills, pt LMOM during lunch. Please advise

## 2014-11-03 NOTE — Telephone Encounter (Signed)
Patient aware that she is to take the nerve pill before her MRI if needed

## 2014-11-04 ENCOUNTER — Ambulatory Visit (HOSPITAL_COMMUNITY)
Admission: RE | Admit: 2014-11-04 | Discharge: 2014-11-04 | Disposition: A | Discharge status: Home / Self Care | Payer: 59 | Source: Ambulatory Visit | Attending: Family Medicine | Admitting: Family Medicine

## 2014-11-04 DIAGNOSIS — R519 Headache, unspecified: Secondary | ICD-10-CM

## 2014-11-04 DIAGNOSIS — C50919 Malignant neoplasm of unspecified site of unspecified female breast: Secondary | ICD-10-CM | POA: Diagnosis not present

## 2014-11-04 DIAGNOSIS — R51 Headache: Secondary | ICD-10-CM | POA: Insufficient documentation

## 2014-11-04 DIAGNOSIS — G4489 Other headache syndrome: Secondary | ICD-10-CM

## 2014-11-04 LAB — POCT I-STAT CREATININE: Creatinine, Ser: 0.8 mg/dL (ref 0.50–1.10)

## 2014-11-04 MED ORDER — GADOBENATE DIMEGLUMINE 529 MG/ML IV SOLN
18.0000 mL | Freq: Once | INTRAVENOUS | Status: AC | PRN
  Administered 2014-11-04: 18 mL via INTRAVENOUS

## 2014-11-09 ENCOUNTER — Ambulatory Visit: Payer: 59 | Admitting: Family Medicine

## 2014-12-21 ENCOUNTER — Ambulatory Visit (INDEPENDENT_AMBULATORY_CARE_PROVIDER_SITE_OTHER): Payer: 59 | Admitting: Family Medicine

## 2014-12-21 ENCOUNTER — Encounter: Payer: Self-pay | Admitting: Family Medicine

## 2014-12-21 VITALS — BP 138/84 | HR 96 | Resp 18 | Ht 62.0 in | Wt 196.0 lb

## 2014-12-21 DIAGNOSIS — R7309 Other abnormal glucose: Secondary | ICD-10-CM | POA: Diagnosis not present

## 2014-12-21 DIAGNOSIS — I10 Essential (primary) hypertension: Secondary | ICD-10-CM | POA: Diagnosis not present

## 2014-12-21 DIAGNOSIS — F17209 Nicotine dependence, unspecified, with unspecified nicotine-induced disorders: Secondary | ICD-10-CM

## 2014-12-21 DIAGNOSIS — R7303 Prediabetes: Secondary | ICD-10-CM

## 2014-12-21 DIAGNOSIS — G44001 Cluster headache syndrome, unspecified, intractable: Secondary | ICD-10-CM

## 2014-12-21 DIAGNOSIS — E785 Hyperlipidemia, unspecified: Secondary | ICD-10-CM

## 2014-12-21 DIAGNOSIS — IMO0001 Reserved for inherently not codable concepts without codable children: Secondary | ICD-10-CM

## 2014-12-21 NOTE — Patient Instructions (Signed)
Annual physical exam in 4 month, call if you need me before   It is important that you exercise regularly at least 30 minutes 5 times a week. If you develop chest pain, have severe difficulty breathing, or feel very tired, stop exercising immediately and seek medical attention   A healthy diet is rich in fruit, vegetables and whole grains. Poultry fish, nuts and beans are a healthy choice for protein rather then red meat. A low sodium diet and drinking 64 ounces of water daily is generally recommended. Oils and sweet should be limited. Carbohydrates especially for those who are diabetic or overweight, should be limited to 60-45 gram per meal. It is important to eat on a regular schedule, at least 3 times daily. Snacks should be primarily fruits, vegetables or nuts.  Need to QUIT cigarettes pls decide to do so   Glad headaches better follow through the treatment  BP is good no med change  Fasting lipid,HBA1C and chem 7 and TSH  In 4 month

## 2014-12-28 ENCOUNTER — Other Ambulatory Visit: Payer: Self-pay | Admitting: Family Medicine

## 2015-01-20 ENCOUNTER — Other Ambulatory Visit (HOSPITAL_COMMUNITY): Payer: Self-pay

## 2015-01-20 DIAGNOSIS — C50919 Malignant neoplasm of unspecified site of unspecified female breast: Secondary | ICD-10-CM

## 2015-01-25 ENCOUNTER — Encounter (HOSPITAL_COMMUNITY): Payer: Self-pay | Admitting: Hematology & Oncology

## 2015-01-25 ENCOUNTER — Encounter (HOSPITAL_COMMUNITY): Payer: 59 | Attending: Hematology & Oncology

## 2015-01-25 ENCOUNTER — Encounter (HOSPITAL_BASED_OUTPATIENT_CLINIC_OR_DEPARTMENT_OTHER): Payer: 59 | Admitting: Hematology & Oncology

## 2015-01-25 ENCOUNTER — Ambulatory Visit (HOSPITAL_COMMUNITY): Payer: 59 | Admitting: Hematology & Oncology

## 2015-01-25 VITALS — BP 133/79 | HR 89 | Temp 99.1°F | Resp 16 | Wt 199.8 lb

## 2015-01-25 DIAGNOSIS — C50919 Malignant neoplasm of unspecified site of unspecified female breast: Secondary | ICD-10-CM | POA: Insufficient documentation

## 2015-01-25 DIAGNOSIS — Z853 Personal history of malignant neoplasm of breast: Secondary | ICD-10-CM | POA: Diagnosis not present

## 2015-01-25 LAB — CBC WITH DIFFERENTIAL/PLATELET
Basophils Absolute: 0 10*3/uL (ref 0.0–0.1)
Basophils Relative: 1 % (ref 0–1)
EOS ABS: 0.3 10*3/uL (ref 0.0–0.7)
Eosinophils Relative: 3 % (ref 0–5)
HCT: 42.2 % (ref 36.0–46.0)
Hemoglobin: 14 g/dL (ref 12.0–15.0)
LYMPHS ABS: 3.2 10*3/uL (ref 0.7–4.0)
LYMPHS PCT: 39 % (ref 12–46)
MCH: 30.9 pg (ref 26.0–34.0)
MCHC: 33.2 g/dL (ref 30.0–36.0)
MCV: 93.2 fL (ref 78.0–100.0)
MONO ABS: 0.5 10*3/uL (ref 0.1–1.0)
MONOS PCT: 6 % (ref 3–12)
Neutro Abs: 4.2 10*3/uL (ref 1.7–7.7)
Neutrophils Relative %: 51 % (ref 43–77)
Platelets: 425 10*3/uL — ABNORMAL HIGH (ref 150–400)
RBC: 4.53 MIL/uL (ref 3.87–5.11)
RDW: 14 % (ref 11.5–15.5)
WBC: 8.2 10*3/uL (ref 4.0–10.5)

## 2015-01-25 LAB — COMPREHENSIVE METABOLIC PANEL
ALK PHOS: 66 U/L (ref 38–126)
ALT: 16 U/L (ref 14–54)
AST: 19 U/L (ref 15–41)
Albumin: 4.1 g/dL (ref 3.5–5.0)
Anion gap: 9 (ref 5–15)
BILIRUBIN TOTAL: 0.5 mg/dL (ref 0.3–1.2)
BUN: 13 mg/dL (ref 6–20)
CALCIUM: 9.4 mg/dL (ref 8.9–10.3)
CO2: 25 mmol/L (ref 22–32)
Chloride: 106 mmol/L (ref 101–111)
Creatinine, Ser: 0.53 mg/dL (ref 0.44–1.00)
Glucose, Bld: 91 mg/dL (ref 70–99)
Potassium: 3.8 mmol/L (ref 3.5–5.1)
SODIUM: 140 mmol/L (ref 135–145)
Total Protein: 7.6 g/dL (ref 6.5–8.1)

## 2015-01-25 NOTE — Patient Instructions (Signed)
[facility] Discharge Instructions  RECOMMENDATIONS MADE BY THE CONSULTANT AND ANY TEST RESULTS WILL BE SENT TO YOUR REFERRING PHYSICIAN.  Exam and discussion by Dr. Whitney Muse. Report any new lumps, bone pain, shortness of breath or other symptoms. Call with any concerns.  Follow-up in 1 year.  Thank you for choosing [facility] to provide your oncology and hematology care.  To afford each patient quality time with our provider, please arrive at least 15 minutes before your scheduled appointment time.    You need to re-schedule your appointment should you arrive 10 or more minutes late.  We strive to give you quality time with our providers, and arriving late affects you and other patients whose appointments are after yours.  Also, if you no show three or more times for appointments you may be dismissed from the clinic at the providers discretion.     Again, thank you for choosing [facility].  Our hope is that these requests will decrease the amount of time that you wait before being seen by our physicians.       _____________________________________________________________  Should you have questions after your visit to [facility], please contact our office at [phone] between the hours of 8:30 a.m. and 4:30 p.m.  Voicemails left after 4:30 p.m. will not be returned until the following business day.  For prescription refill requests, have your pharmacy contact our office.

## 2015-01-25 NOTE — Progress Notes (Signed)
Labs drawn

## 2015-01-25 NOTE — Progress Notes (Signed)
[facility] NOTE  Patient Care Team: Fayrene Helper, MD as PCP - General  DIAGNOSIS: Malignant neoplasm of breast (female), unspecified site  Stage II left breast cancer, status post lumpectomy, axillary lymph node dissection, chemotherapy with Adriamycin and Cytoxan for 4 cycles followed by now will be in Taxotere for 4 cycles given in a dose dense fashion followed by one year of trastuzumab. Surgery was performed on 10/18/2004.    HISTORY OF PRESENTING ILLNESS:  [name] [name] 54 y.o. female is here because of breast cancer.  She didn't have genetic testing done when she was diagnosed. She started having headaches on 10/26/14. She was ultimately diagnosed with migraine headaches. She hasn't had any since starting trigger point injections. She gets up to 30 injections per visit. [facility] is [facility] in [place].  Scheduled for mammogram. She's due for a PAP smear this year. She's eating and sleeping well. Has no complaints.  He denies any significant change in her appetite or energy level. She denies any new lumps or bumps.  MEDICAL HISTORY:  Past Medical History  Diagnosis Date  . Hyperlipidemia   . Obesity   . Heel spur   . Hypertension   . Adenocarcinoma of breast     left   . Migraines     SURGICAL HISTORY: Past Surgical History  Procedure Laterality Date  . Btl  1992  . Cholecystectomy    . Left lumpectomy and lymph node disection  2000  . R heel surgery for spur and chipped bone  11/09/20099  . Tubal ligation    . Colonoscopy  09/15/2011    Procedure: COLONOSCOPY;  Surgeon: Dorothyann Peng, MD;  Location: [facility];  Service: Endoscopy;  Laterality: N/A;  9:15 AM    SOCIAL HISTORY: History   Social History  . Marital Status: Married    Spouse Name: N/A  . Number of Children: N/A  . Years of Education: N/A   Occupational History  . Not on file.   Social History Main Topics  .  Smoking status: Current Every Day Smoker -- 0.25 packs/day for 15 years    Types: Cigarettes  . Smokeless tobacco: Not on file  . Alcohol Use: No  . Drug Use: No  . Sexual Activity: Yes    Birth Control/ Protection: Post-menopausal   Other Topics Concern  . Not on file   Social History Narrative    FAMILY HISTORY: Family History  Problem Relation Age of Onset  . Heart attack Mother   . Heart attack Father   . Glaucoma Father   . Hypertension Father   . Hypertension Sister   . Diabetes Sister   . Anesthesia problems Neg Hx   . Hypotension Neg Hx   . Malignant hyperthermia Neg Hx   . Pseudochol deficiency Neg Hx    indicated that her mother is deceased. She indicated that her father is alive. She indicated that both of her sisters are alive. She indicated that her brother is alive.   She has 3 children and 1 grandchild; 44 y/o Her children are 61 and 2 year old 43  No one else in her family had breast cancer.   ALLERGIES:  is allergic to ace inhibitors.  MEDICATIONS:  Current Outpatient Prescriptions  Medication Sig Dispense Refill  . Black Cohosh 540 MG CAPS Take 1 capsule by mouth daily.    . fluticasone (FLONASE) 50 MCG/ACT nasal spray PLACE  2 SPRAYS INTO BOTH NOSTRILS DAILY. 48 g 1  . hydrochlorothiazide (HYDRODIURIL) 25 MG tablet TAKE 1 TABLET BY MOUTH DAILY 90 tablet PRN  . ibuprofen (ADVIL,MOTRIN) 200 MG tablet Take 200 mg by mouth every 6 (six) hours as needed for mild pain or moderate pain.    Marland Kitchen ibuprofen (ADVIL,MOTRIN) 800 MG tablet Take 1 tablet (800 mg total) by mouth daily as needed. 30 tablet 1  . potassium chloride SA (K-DUR,KLOR-CON) 20 MEQ tablet TAKE 1 TABLET BY MOUTH ONCE DAILY 90 tablet PRN  . topiramate (TOPAMAX) 25 MG tablet Take 25 mg by mouth at bedtime.    . baclofen (LIORESAL) 10 MG tablet Take 10 mg by mouth 2 (two) times daily as needed for muscle spasms.    Marland Kitchen loratadine (CLARITIN) 10 MG tablet Take 10 mg by mouth once as needed for  allergies.     No current facility-administered medications for this visit.    Review of Systems  Constitutional: Negative for fever, chills, weight loss and malaise/fatigue.  HENT: Negative for congestion, hearing loss, nosebleeds, sore throat and tinnitus.   Eyes: Negative for blurred vision, double vision, pain and discharge.  Respiratory: Negative for cough, hemoptysis, sputum production, shortness of breath and wheezing.   Cardiovascular: Negative for chest pain, palpitations, claudication, leg swelling and PND.  Gastrointestinal: Negative for heartburn, nausea, vomiting, abdominal pain, diarrhea, constipation, blood in stool and melena.  Genitourinary: Negative for dysuria, urgency, frequency and hematuria.  Musculoskeletal: Negative for myalgias, joint pain and falls.  Skin: Negative for itching and rash.  Neurological: Negative for dizziness, tingling, tremors, sensory change, speech change, focal weakness, seizures, loss of consciousness, weakness and headaches.  Endo/Heme/Allergies: Does not bruise/bleed easily.  Psychiatric/Behavioral: Negative for depression, suicidal ideas, memory loss and substance abuse. The patient is not nervous/anxious and does not have insomnia.    14 point ROS was done and is otherwise as detailed above or in HPI   PHYSICAL EXAMINATION: ECOG PERFORMANCE STATUS: 0 - Asymptomatic  Filed Vitals:   01/25/15 1005  BP: 133/79  Pulse: 89  Temp: 99.1 F (37.3 C)  Resp: 16   Filed Weights   01/25/15 1005  Weight: 199 lb 12.8 oz (90.629 kg)     Physical Exam  Constitutional: She is oriented to person, place, and time and well-developed, well-nourished, and in no distress.  HENT:  Head: Normocephalic and atraumatic.  Nose: Nose normal.  Mouth/Throat: Oropharynx is clear and moist. No oropharyngeal exudate.  Eyes: Conjunctivae and EOM are normal. Pupils are equal, round, and reactive to light. Right eye exhibits no discharge. Left eye exhibits no  discharge. No scleral icterus.  Neck: Normal range of motion. Neck supple. No tracheal deviation present. No thyromegaly present.  Cardiovascular: Normal rate, regular rhythm and normal heart sounds.  Exam reveals no gallop and no friction rub.   No murmur heard. Pulmonary/Chest: Effort normal and breath sounds normal. She has no wheezes. She has no rales.  Abdominal: Soft. Bowel sounds are normal. She exhibits no distension and no mass. There is no tenderness. There is no rebound and no guarding.  Musculoskeletal: Normal range of motion. She exhibits no edema.  Lymphadenopathy:    She has no cervical adenopathy.  Neurological: She is alert and oriented to person, place, and time. She has normal reflexes. No cranial nerve deficit. Gait normal. Coordination normal.  Skin: Skin is warm and dry. No rash noted.  Psychiatric: Mood, memory, affect and judgment normal.  Nursing note and vitals reviewed.  BREAST: Radiation changes to left breast. Otherwise no nodules. Nipples normal and healthy. No palpable lymph nodes in bilateral axilla.   LABORATORY DATA:  I have reviewed the data as listed Lab Results  Component Value Date   WBC 8.2 01/25/2015   HGB 14.0 01/25/2015   HCT 42.2 01/25/2015   MCV 93.2 01/25/2015   PLT 425* 01/25/2015     RADIOGRAPHIC STUDIES: I have personally reviewed the radiological images as listed and agreed with the findings in the report. CLINICAL DATA: Screening. History of left lumpectomy with chemotherapy and radiation treatment in 2006 and 2007.  EXAM: DIGITAL SCREENING BILATERAL MAMMOGRAM WITH CAD  COMPARISON: Previous exam(s).  ACR Breast Density Category b: There are scattered areas of fibroglandular density.  FINDINGS: There are no findings suspicious for malignancy. Post operative changes are seen in the leftbreast. Images were processed with CAD.  IMPRESSION: No mammographic evidence of malignancy. A result letter of this screening  mammogram will be mailed directly to the patient.  RECOMMENDATION: Screening mammogram in one year. (Code:SM-B-01Y)  BI-RADS CATEGORY 1: Negative.   Electronically Signed  By: Shon Hale M.D.  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 I also reviewed her physical examination advised her that her breast exam is unremarkable. I discussed genetic testing in detail and would like to refer her for genetic counseling. She has questions about the cost and we can certainly look into this for her.  She will be due for an upcoming screening mammogram and we will order this. We will continue with yearly follow-up. If she has any interim problems or concerns she was advised to let us know.  Looking cessation was addressed with the patient. We offered her methods to quit including referral to smoking cessation class. She is currently unwilling to discontinue.   Orders Placed This Encounter  Procedures  . MM Digital Screening    Standing Status: Future     Number of Occurrences:      Standing Expiration Date: 01/25/2016    Order Specific Question:  Reason for Exam (SYMPTOM  OR DIAGNOSIS REQUIRED)    Answer:  screening, breast cancer history    Order Specific Question:  Is the patient pregnant?    Answer:  No    Order Specific Question:  Preferred imaging location?    Answer:  [facility]  . CBC with Differential    Standing Status: Future     Number of Occurrences:      Standing Expiration Date: 01/25/2016  . Comprehensive metabolic panel    Standing Status: Future     Number of Occurrences:      Standing Expiration Date: 01/25/2016    All questions were answered. The patient knows to call the clinic with any problems, questions or concerns.  This note was electronically signed.    This document serves as a record of  services personally performed by Ancil Linsey, MD. It was created on her behalf by Pearlie Oyster, a trained medical scribe. The creation of this record is based on the scribe's personal observations and the provider's statements to them. This document has been checked and approved by the attending provider.     Kelby Fam. Leshaun Biebel MD

## 2015-01-26 LAB — CANCER ANTIGEN 27.29: CA 27.29: 23.2 U/mL (ref 0.0–38.6)

## 2015-01-27 ENCOUNTER — Ambulatory Visit (HOSPITAL_COMMUNITY): Payer: 59

## 2015-01-27 ENCOUNTER — Other Ambulatory Visit (HOSPITAL_COMMUNITY): Payer: 59

## 2015-03-05 NOTE — Assessment & Plan Note (Signed)
Improved, is not taking most of the medication prescribed by neurology however, she is encouraged to do so and to keep f/u

## 2015-03-05 NOTE — Assessment & Plan Note (Signed)
Updated lab needed at/ before next visit.  Patient educated about the importance of limiting  Carbohydrate intake , the need to commit to daily physical activity for a minimum of 30 minutes , and to commit weight loss. The fact that changes in all these areas will reduce or eliminate all together the development of diabetes is stressed.   Diabetic Labs Latest Ref Rng 01/25/2015 11/04/2014 08/10/2014 04/06/2014 01/26/2014  HbA1c <5.7 % - - 6.1(H) 5.9(H) -  Chol 0 - 200 mg/dL - - 191 - -  HDL >39 mg/dL - - 52 - -  Calc LDL 0 - 99 mg/dL - - 118(H) - -  Triglycerides <150 mg/dL - - 103 - -  Creatinine 0.44 - 1.00 mg/dL 0.53 0.80 0.65 0.65 0.64   BP/Weight 01/25/2015 12/21/2014 10/27/2014 10/26/2014 08/10/2014 7/00/1749 12/27/9673  Systolic BP 916 384 665 993 570 177 939  Diastolic BP 79 84 82 81 84 90 96  Wt. (Lbs) 199.8 196 194.04 185 200.4 197.04 198  BMI 36.53 35.84 35.48 33.83 36.64 36.03 36.21   No flowsheet data found.

## 2015-03-05 NOTE — Assessment & Plan Note (Signed)
Deteriorated. Patient re-educated about  the importance of commitment to a  minimum of 150 minutes of exercise per week.  The importance of healthy food choices with portion control discussed. Encouraged to start a food diary, count calories and to consider  joining a support group. Sample diet sheets offered. Goals set by the patient for the next several months.   Weight /BMI 01/25/2015 12/21/2014 10/27/2014  WEIGHT 199 lb 12.8 oz 196 lb 194 lb 0.6 oz  HEIGHT - 5\' 2"  5\' 2"   BMI 36.53 kg/m2 35.84 kg/m2 35.48 kg/m2    Current exercise per week 90 minutes.

## 2015-03-05 NOTE — Progress Notes (Signed)
Subjective:    Patient ID: [name] [name], female    DOB: 10-23-1960, 54 y.o.   MRN: [id number]  HPI The PT is here for follow up and re-evaluation of chronic medical conditions, medication management and review of any available recent lab and radiology data.  Preventive health is updated, specifically  Cancer screening and Immunization.   Questions or concerns regarding consultations or procedures which the PT has had in the interim are  Addressed.Resolution of headache despite not taking meds prescribed The PT denies any adverse reactions to current medications since the last visit.  There are no new concerns.  There are no specific complaints       Review of Systems See HPI Denies recent fever or chills. Denies sinus pressure, nasal congestion, ear pain or sore throat. Denies chest congestion, productive cough or wheezing. Denies chest pains, palpitations and leg swelling Denies abdominal pain, nausea, vomiting,diarrhea or constipation.   Denies dysuria, frequency, hesitancy or incontinence. Denies joint pain, swelling and limitation in mobility. Denies headaches, seizures, numbness, or tingling. Denies depression, anxiety or insomnia. Denies skin break down or rash.        Objective:   Physical Exam  BP 138/84 mmHg  Pulse 96  Resp 18  Ht 5\' 2"  (1.575 m)  Wt 196 lb (88.905 kg)  BMI 35.84 kg/m2  SpO2 98% Patient alert and oriented and in no cardiopulmonary distress.  HEENT: No facial asymmetry, EOMI,   oropharynx pink and moist.  Neck supple no JVD, no mass.  Chest: Clear to auscultation bilaterally.  CVS: S1, S2 no murmurs, no S3.Regular rate.  ABD: Soft non tender.   Ext: No edema  MS: Adequate ROM spine, shoulders, hips and knees.  Skin: Intact, no ulcerations or rash noted.  Psych: Good eye contact, normal affect. Memory intact not anxious or depressed appearing.  CNS: CN 2-12 intact, power,  normal throughout.no focal deficits noted.         Assessment & Plan:  Essential hypertension Controlled, no change in medication DASH diet and commitment to daily physical activity for a minimum of 30 minutes discussed and encouraged, as a part of hypertension management. The importance of attaining a healthy weight is also discussed.  BP/Weight 01/25/2015 12/21/2014 10/27/2014 10/26/2014 08/10/2014 8/85/0277 12/24/2876  Systolic BP 676 720 947 096 283 662 947  Diastolic BP 79 84 82 81 84 90 96  Wt. (Lbs) 199.8 196 194.04 185 200.4 197.04 198  BMI 36.53 35.84 35.48 33.83 36.64 36.03 36.21        Prediabetes Updated lab needed at/ before next visit.  Patient educated about the importance of limiting  Carbohydrate intake , the need to commit to daily physical activity for a minimum of 30 minutes , and to commit weight loss. The fact that changes in all these areas will reduce or eliminate all together the development of diabetes is stressed.   Diabetic Labs Latest Ref Rng 01/25/2015 11/04/2014 08/10/2014 04/06/2014 01/26/2014  HbA1c <5.7 % - - 6.1(H) 5.9(H) -  Chol 0 - 200 mg/dL - - 191 - -  HDL >39 mg/dL - - 52 - -  Calc LDL 0 - 99 mg/dL - - 118(H) - -  Triglycerides <150 mg/dL - - 103 - -  Creatinine 0.44 - 1.00 mg/dL 0.53 0.80 0.65 0.65 0.64   BP/Weight 01/25/2015 12/21/2014 10/27/2014 10/26/2014 08/10/2014 6/54/6503 01/27/6567  Systolic BP 127 517 001 749 449 675 916  Diastolic BP 79 84 82 81 84 90 96  Wt. (Lbs) 199.8 196 194.04 185 200.4 197.04 198  BMI 36.53 35.84 35.48 33.83 36.64 36.03 36.21   No flowsheet data found.     Hyperlipemia Hyperlipidemia:Low fat diet discussed and encouraged.   Lipid Panel  Lab Results  Component Value Date   CHOL 191 08/10/2014   HDL 52 08/10/2014   LDLCALC 118* 08/10/2014   TRIG 103 08/10/2014   CHOLHDL 3.7 08/10/2014   Updated lab needed at/ before next visit.      Obesity, Class II, BMI 35-39.9, with comorbidity Deteriorated. Patient re-educated about  the importance of commitment to a   minimum of 150 minutes of exercise per week.  The importance of healthy food choices with portion control discussed. Encouraged to start a food diary, count calories and to consider  joining a support group. Sample diet sheets offered. Goals set by the patient for the next several months.   Weight /BMI 01/25/2015 12/21/2014 10/27/2014  WEIGHT 199 lb 12.8 oz 196 lb 194 lb 0.6 oz  HEIGHT - 5\' 2"  5\' 2"   BMI 36.53 kg/m2 35.84 kg/m2 35.48 kg/m2    Current exercise per week 90 minutes.   Headache Improved, is not taking most of the medication prescribed by neurology however, she is encouraged to do so and to keep f/u  Nicotine dependence Patient counseled for approximately 5 minutes regarding the health risks of ongoing nicotine use, specifically all types of cancer, heart disease, stroke and respiratory failure. The options available for help with cessation ,the behavioral changes to assist the process, and the option to either gradully reduce usage  Or abruptly stop.is also discussed. Pt is also encouraged to set specific goals in number of cigarettes used daily, as well as to set a quit date.  Number of cigarettes/cigars currently smoking daily: 7 to 10

## 2015-03-05 NOTE — Assessment & Plan Note (Signed)
Controlled, no change in medication DASH diet and commitment to daily physical activity for a minimum of 30 minutes discussed and encouraged, as a part of hypertension management. The importance of attaining a healthy weight is also discussed.  BP/Weight 01/25/2015 12/21/2014 10/27/2014 10/26/2014 08/10/2014 7/89/3810 10/01/5100  Systolic BP 585 277 824 235 361 443 154  Diastolic BP 79 84 82 81 84 90 96  Wt. (Lbs) 199.8 196 194.04 185 200.4 197.04 198  BMI 36.53 35.84 35.48 33.83 36.64 36.03 36.21

## 2015-03-05 NOTE — Assessment & Plan Note (Signed)
Hyperlipidemia:Low fat diet discussed and encouraged.   Lipid Panel  Lab Results  Component Value Date   CHOL 191 08/10/2014   HDL 52 08/10/2014   LDLCALC 118* 08/10/2014   TRIG 103 08/10/2014   CHOLHDL 3.7 08/10/2014   Updated lab needed at/ before next visit.

## 2015-03-05 NOTE — Assessment & Plan Note (Signed)
Patient counseled for approximately 5 minutes regarding the health risks of ongoing nicotine use, specifically all types of cancer, heart disease, stroke and respiratory failure. The options available for help with cessation ,the behavioral changes to assist the process, and the option to either gradully reduce usage  Or abruptly stop.is also discussed. Pt is also encouraged to set specific goals in number of cigarettes used daily, as well as to set a quit date.  Number of cigarettes/cigars currently smoking daily: 7 to 10  

## 2015-03-18 ENCOUNTER — Other Ambulatory Visit: Payer: Self-pay | Admitting: Family Medicine

## 2015-03-18 DIAGNOSIS — Z1231 Encounter for screening mammogram for malignant neoplasm of breast: Secondary | ICD-10-CM

## 2015-04-26 ENCOUNTER — Ambulatory Visit (HOSPITAL_COMMUNITY)
Admission: RE | Admit: 2015-04-26 | Discharge: 2015-04-26 | Disposition: A | Discharge status: Home / Self Care | Payer: 59 | Source: Ambulatory Visit | Attending: Family Medicine | Admitting: Family Medicine

## 2015-04-26 ENCOUNTER — Ambulatory Visit (INDEPENDENT_AMBULATORY_CARE_PROVIDER_SITE_OTHER): Payer: 59 | Admitting: Family Medicine

## 2015-04-26 ENCOUNTER — Encounter: Payer: Self-pay | Admitting: Family Medicine

## 2015-04-26 VITALS — BP 136/82 | HR 71 | Resp 16 | Ht 62.0 in | Wt 202.0 lb

## 2015-04-26 DIAGNOSIS — Z1231 Encounter for screening mammogram for malignant neoplasm of breast: Secondary | ICD-10-CM | POA: Insufficient documentation

## 2015-04-26 DIAGNOSIS — M25561 Pain in right knee: Secondary | ICD-10-CM

## 2015-04-26 DIAGNOSIS — W19XXXA Unspecified fall, initial encounter: Secondary | ICD-10-CM | POA: Diagnosis not present

## 2015-04-26 DIAGNOSIS — F17208 Nicotine dependence, unspecified, with other nicotine-induced disorders: Secondary | ICD-10-CM

## 2015-04-26 DIAGNOSIS — M25461 Effusion, right knee: Secondary | ICD-10-CM | POA: Diagnosis not present

## 2015-04-26 DIAGNOSIS — I10 Essential (primary) hypertension: Secondary | ICD-10-CM | POA: Diagnosis not present

## 2015-04-26 DIAGNOSIS — M25661 Stiffness of right knee, not elsewhere classified: Secondary | ICD-10-CM | POA: Diagnosis not present

## 2015-04-26 MED ORDER — PREDNISONE 5 MG (21) PO TBPK: 5.0000 mg | ORAL_TABLET | ORAL | Status: DC

## 2015-04-26 MED ORDER — KETOROLAC TROMETHAMINE 60 MG/2ML IM SOLN
60.0000 mg | Freq: Once | INTRAMUSCULAR | Status: AC
  Administered 2015-04-26: 60 mg via INTRAMUSCULAR

## 2015-04-26 MED ORDER — METHYLPREDNISOLONE ACETATE 80 MG/ML IJ SUSP
80.0000 mg | Freq: Once | INTRAMUSCULAR | Status: AC
  Administered 2015-04-26: 80 mg via INTRAMUSCULAR

## 2015-04-26 MED ORDER — HYDROCODONE-ACETAMINOPHEN 7.5-300 MG PO TABS: ORAL_TABLET | ORAL | Status: DC

## 2015-04-26 NOTE — Progress Notes (Signed)
   Subjective:    Patient ID: [name] [name], female    DOB: Jan 31, 1961, 54 y.o.   MRN: [id number]  HPI [place] at work on 03/09/2015 approximately, rolling  chair slid from under her, she fell face forward onto her knees  Approx 1 week later started experiencing right knee pain and swelling up to a 10, limits mobility, sometimes feels as  As though it will buckle, no falls, has awakened from her sleep every night due to pain   Review of Systems See HPI Denies recent fever or chills. Denies sinus pressure, nasal congestion, ear pain or sore throat. Denies chest congestion, productive cough or wheezing. Denies chest pains, palpitations and leg swelling Denies abdominal pain, nausea, vomiting,diarrhea or constipation.   Denies skin break down or rash.        Objective:   Physical Exam  BP 136/82 mmHg  Pulse 71  Resp 16  Ht 5\' 2"  (1.575 m)  Wt 202 lb (91.627 kg)  BMI 36.94 kg/m2  SpO2 97% Pt in pain Patient alert and oriented and in no cardiopulmonary distress.  HEENT: No facial asymmetry, EOMI,   oropharynx pink and moist.  Neck supple no JVD, no mass.  Chest: Clear to auscultation bilaterally.  CVS: S1, S2 no murmurs, no S3.Regular rate.  ABD: Soft non tender.   Ext: No edema  MS: Adequate ROM spine , shoulders, hips, reduced ROM right knee with swelling and crepitus.  Skin: Intact, no ulcerations or rash noted.  CNS: CN 2-12 intact, power,  normal throughout.no focal deficits noted.      Assessment & Plan:  Knee pain, right Uncontrolled.Toradol and depo medrol administered IM in the office , to be followed by a short course of oral prednisone and NSAIDS.   Essential hypertension Controlled, no change in medication DASH diet and commitment to daily physical activity for a minimum of 30 minutes discussed and encouraged, as a part of hypertension management. The importance of attaining a healthy weight is also discussed.  BP/Weight 05/17/2015 04/26/2015 01/25/2015  12/21/2014 10/27/2014 10/26/2014 04/88/8916  Systolic BP 945 038 882 800 349 179 150  Diastolic BP 88 82 79 84 82 81 84  Wt. (Lbs) 201.12 202 199.8 196 194.04 185 200.4  BMI 36.78 36.94 36.53 35.84 35.48 33.83 36.64        Nicotine dependence Patient counseled for approximately 5 minutes regarding the health risks of ongoing nicotine use, specifically all types of cancer, heart disease, stroke and respiratory failure. The options available for help with cessation ,the behavioral changes to assist the process, and the option to either 3

## 2015-04-26 NOTE — Assessment & Plan Note (Signed)
Uncontrolled.Toradol and depo medrol administered IM in the office , to be followed by a short course of oral prednisone and NSAIDS.  

## 2015-04-26 NOTE — Patient Instructions (Signed)
F/u as before  You have a significant knee injury  Treatment started here today as discussed  CALL in 2 weeks if not better you will need to see orthopedics

## 2015-05-17 ENCOUNTER — Encounter: Payer: Self-pay | Admitting: Family Medicine

## 2015-05-17 ENCOUNTER — Other Ambulatory Visit (HOSPITAL_COMMUNITY)
Admission: RE | Admit: 2015-05-17 | Discharge: 2015-05-17 | Disposition: A | Discharge status: Home / Self Care | Payer: 59 | Source: Ambulatory Visit | Attending: Family Medicine | Admitting: Family Medicine

## 2015-05-17 ENCOUNTER — Ambulatory Visit (INDEPENDENT_AMBULATORY_CARE_PROVIDER_SITE_OTHER): Payer: 59 | Admitting: Family Medicine

## 2015-05-17 ENCOUNTER — Other Ambulatory Visit: Payer: Self-pay | Admitting: Family Medicine

## 2015-05-17 ENCOUNTER — Telehealth: Payer: Self-pay

## 2015-05-17 VITALS — BP 132/88 | HR 86 | Resp 18 | Ht 62.0 in | Wt 201.1 lb

## 2015-05-17 DIAGNOSIS — Z124 Encounter for screening for malignant neoplasm of cervix: Secondary | ICD-10-CM

## 2015-05-17 DIAGNOSIS — Z1211 Encounter for screening for malignant neoplasm of colon: Secondary | ICD-10-CM

## 2015-05-17 DIAGNOSIS — F17208 Nicotine dependence, unspecified, with other nicotine-induced disorders: Secondary | ICD-10-CM | POA: Diagnosis not present

## 2015-05-17 DIAGNOSIS — G43909 Migraine, unspecified, not intractable, without status migrainosus: Secondary | ICD-10-CM

## 2015-05-17 DIAGNOSIS — I10 Essential (primary) hypertension: Secondary | ICD-10-CM

## 2015-05-17 DIAGNOSIS — R7303 Prediabetes: Secondary | ICD-10-CM

## 2015-05-17 DIAGNOSIS — E785 Hyperlipidemia, unspecified: Secondary | ICD-10-CM

## 2015-05-17 DIAGNOSIS — Z0000 Encounter for general adult medical examination without abnormal findings: Secondary | ICD-10-CM | POA: Diagnosis not present

## 2015-05-17 DIAGNOSIS — Z01411 Encounter for gynecological examination (general) (routine) with abnormal findings: Secondary | ICD-10-CM | POA: Insufficient documentation

## 2015-05-17 LAB — LIPID PANEL
CHOL/HDL RATIO: 3.3 ratio (ref <=5.0)
Cholesterol: 213 mg/dL — ABNORMAL HIGH (ref 125–200)
HDL: 65 mg/dL (ref >=46)
LDL CALC: 135 mg/dL — AB (ref <130)
Triglycerides: 63 mg/dL (ref <150)
VLDL: 13 mg/dL (ref <30)

## 2015-05-17 LAB — BASIC METABOLIC PANEL
BUN: 11 mg/dL (ref 7–25)
CALCIUM: 9.4 mg/dL (ref 8.6–10.4)
CO2: 26 mmol/L (ref 20–31)
CREATININE: 0.63 mg/dL (ref 0.50–1.05)
Chloride: 104 mmol/L (ref 98–110)
GLUCOSE: 84 mg/dL (ref 65–99)
Potassium: 3.8 mmol/L (ref 3.5–5.3)
Sodium: 141 mmol/L (ref 135–146)

## 2015-05-17 LAB — POC HEMOCCULT BLD/STL (OFFICE/1-CARD/DIAGNOSTIC): Fecal Occult Blood, POC: NEGATIVE

## 2015-05-17 LAB — TSH: TSH: 1.179 u[IU]/mL (ref 0.350–4.500)

## 2015-05-17 LAB — HEMOGLOBIN A1C
Hgb A1c MFr Bld: 6.1 % — ABNORMAL HIGH (ref <5.7)
Mean Plasma Glucose: 128 mg/dL — ABNORMAL HIGH (ref <117)

## 2015-05-17 NOTE — Assessment & Plan Note (Signed)

## 2015-05-17 NOTE — Assessment & Plan Note (Signed)

## 2015-05-17 NOTE — Telephone Encounter (Signed)
Fasting labs re-ordered

## 2015-05-17 NOTE — Patient Instructions (Addendum)
F/u in 4.5 month, call if you need me before   Please work on good  health habits so that your health will improve. 1. Commitment to daily physical activity for 30 to 60  minutes, if you are able to do this.  2. Commitment to wise food choices. Aim for half of your  food intake to be vegetable and fruit, one quarter starchy foods, and one quarter protein. Try to eat on a regular schedule  3 meals per day, snacking between meals should be limited to vegetables or fruits or small portions of nuts. 64 ounces of water per day is generally recommended, unless you have specific health conditions, like heart failure or kidney failure where you will need to limit fluid intake.  3. Commitment to sufficient and a  good quality of physical and mental rest daily, generally between 6 to 8 hours per day.  WITH PERSISTANCE AND PERSEVERANCE, THE IMPOSSIBLE , BECOMES THE NORM!   Thanks for choosing [facility], we consider it a privelige to serve you. PLS DECIDE TO STOP SMOKING  You Can Quit Smoking If you are ready to quit smoking or are thinking about it, congratulations! You have chosen to help yourself be healthier and live longer! There are lots of different ways to quit smoking. Nicotine gum, nicotine patches, a nicotine inhaler, or nicotine nasal spray can help with physical craving. Hypnosis, support groups, and medicines help break the habit of smoking. TIPS TO GET OFF AND STAY OFF CIGARETTES  Learn to predict your moods. Do not let a bad situation be your excuse to have a cigarette. Some situations in your life might tempt you to have a cigarette.  Ask friends and co-workers not to smoke around you.  Make your home smoke-free.  Never have "just one" cigarette. It leads to wanting another and another. Remind yourself of your decision to quit.  On a card, make a list of your reasons for not smoking. Read it at least the same number of times a day as you have a cigarette. Tell yourself  everyday, "I do not want to smoke. I choose not to smoke."  Ask someone at home or work to help you with your plan to quit smoking.  Have something planned after you eat or have a cup of coffee. Take a walk or get other exercise to perk you up. This will help to keep you from overeating.  Try a relaxation exercise to calm you down and decrease your stress. Remember, you may be tense and nervous the first two weeks after you quit. This will pass.  Find new activities to keep your hands busy. Play with a pen, coin, or rubber band. Doodle or draw things on paper.  Brush your teeth right after eating. This will help cut down the craving for the taste of tobacco after meals. You can try mouthwash too.  Try gum, breath mints, or diet candy to keep something in your mouth. IF YOU SMOKE AND WANT TO QUIT:  Do not stock up on cigarettes. Never buy a carton. Wait until one pack is finished before you buy another.  Never carry cigarettes with you at work or at home.  Keep cigarettes as far away from you as possible. Leave them with someone else.  Never carry matches or a lighter with you.  Ask yourself, "Do I need this cigarette or is this just a reflex?"  Bet with someone that you can quit. Put cigarette money in a piggy bank  every morning. If you smoke, you give up the money. If you do not smoke, by the end of the week, you keep the money.  Keep trying. It takes 21 days to change a habit!  Talk to your doctor about using medicines to help you quit. These include nicotine replacement gum, lozenges, or skin patches. Document Released: 07/08/2009 Document Revised: 12/04/2011 Document Reviewed: 07/08/2009 [facility] Patient Information 2015 [employer], [place]. This information is not intended to replace advice given to you by your health care provider. Make sure you discuss any questions you have with your health care provider. Smoking Cessation Quitting smoking is important to your health and has many  advantages. However, it is not always easy to quit since nicotine is a very addictive drug. Oftentimes, people try 3 times or more before being able to quit. This document explains the best ways for you to prepare to quit smoking. Quitting takes hard work and a lot of effort, but you can do it. ADVANTAGES OF QUITTING SMOKING  You will live longer, feel better, and live better.  Your body will feel the impact of quitting smoking almost immediately.  Within 20 minutes, blood pressure decreases. Your pulse returns to its normal level.  After 8 hours, carbon monoxide levels in the blood return to normal. Your oxygen level increases.  After 24 hours, the chance of having a heart attack starts to decrease. Your breath, hair, and body stop smelling like smoke.  After 48 hours, damaged nerve endings begin to recover. Your sense of taste and smell improve.  After 72 hours, the body is virtually free of nicotine. Your bronchial tubes relax and breathing becomes easier.  After 2 to 12 weeks, lungs can hold more air. Exercise becomes easier and circulation improves.  The risk of having a heart attack, stroke, cancer, or lung disease is greatly reduced.  After 1 year, the risk of coronary heart disease is cut in half.  After 5 years, the risk of stroke falls to the same as a nonsmoker.  After 10 years, the risk of lung cancer is cut in half and the risk of other cancers decreases significantly.  After 15 years, the risk of coronary heart disease drops, usually to the level of a nonsmoker.  If you are pregnant, quitting smoking will improve your chances of having a healthy baby.  The people you live with, especially any children, will be healthier.  You will have extra money to spend on things other than cigarettes. QUESTIONS TO THINK ABOUT BEFORE ATTEMPTING TO QUIT You may want to talk about your answers with your health care provider.  Why do you want to quit?  If you tried to quit in the  past, what helped and what did not?  What will be the most difficult situations for you after you quit? How will you plan to handle them?  Who can help you through the tough times? Your family? Friends? A health care provider?  What pleasures do you get from smoking? What ways can you still get pleasure if you quit? Here are some questions to ask your health care provider:  How can you help me to be successful at quitting?  What medicine do you think would be best for me and how should I take it?  What should I do if I need more help?  What is smoking withdrawal like? How can I get information on withdrawal? GET READY  Set a quit date.  Change your environment by getting rid  of all cigarettes, ashtrays, matches, and lighters in your home, car, or work. Do not let people smoke in your home.  Review your past attempts to quit. Think about what worked and what did not. GET SUPPORT AND ENCOURAGEMENT You have a better chance of being successful if you have help. You can get support in many ways.  Tell your family, friends, and coworkers that you are going to quit and need their support. Ask them not to smoke around you.  Get individual, group, or telephone counseling and support. Programs are available at [employer] and health centers. Call your local health department for information about programs in your area.  Spiritual beliefs and practices may help some smokers quit.  Download a "quit meter" on your computer to keep track of quit statistics, such as how long you have gone without smoking, cigarettes not smoked, and money saved.  Get a self-help book about quitting smoking and staying off tobacco. [place] yourself from urges to smoke. Talk to someone, go for a walk, or occupy your time with a task.  Change your normal routine. Take a different route to work. Drink tea instead of coffee. Eat breakfast in a different place.  Reduce your  stress. Take a hot bath, exercise, or read a book.  Plan something enjoyable to do every day. Reward yourself for not smoking.  Explore interactive web-based programs that specialize in helping you quit. GET MEDICINE AND USE IT CORRECTLY Medicines can help you stop smoking and decrease the urge to smoke. Combining medicine with the above behavioral methods and support can greatly increase your chances of successfully quitting smoking.  Nicotine replacement therapy helps deliver nicotine to your body without the negative effects and risks of smoking. Nicotine replacement therapy includes nicotine gum, lozenges, inhalers, nasal sprays, and skin patches. Some may be available over-the-counter and others require a prescription.  Antidepressant medicine helps people abstain from smoking, but how this works is unknown. This medicine is available by prescription.  Nicotinic receptor partial agonist medicine simulates the effect of nicotine in your brain. This medicine is available by prescription. Ask your health care provider for advice about which medicines to use and how to use them based on your health history. Your health care provider will tell you what side effects to look out for if you choose to be on a medicine or therapy. Carefully read the information on the package. Do not use any other product containing nicotine while using a nicotine replacement product.  RELAPSE OR DIFFICULT SITUATIONS Most relapses occur within the first 3 months after quitting. Do not be discouraged if you start smoking again. Remember, most people try several times before finally quitting. You may have symptoms of withdrawal because your body is used to nicotine. You may crave cigarettes, be irritable, feel very hungry, cough often, get headaches, or have difficulty concentrating. The withdrawal symptoms are only temporary. They are strongest when you first quit, but they will go away within 10-14 days. To reduce the  chances of relapse, try to:  Avoid drinking alcohol. Drinking lowers your chances of successfully quitting.  Reduce the amount of caffeine you consume. Once you quit smoking, the amount of caffeine in your body increases and can give you symptoms, such as a rapid heartbeat, sweating, and anxiety.  Avoid smokers because they can make you want to smoke.  Do not let weight gain distract you. Many smokers will gain weight when they quit, usually less  than 10 pounds. Eat a healthy diet and stay active. You can always lose the weight gained after you quit.  Find ways to improve your mood other than smoking. FOR MORE INFORMATION  www.smokefree.gov  Document Released: 09/05/2001 Document Revised: 01/26/2014 Document Reviewed: 12/21/2011 [facility] Patient Information 2015 [place], [place]. This information is not intended to replace advice given to you by your health care provider. Make sure you discuss any questions you have with your health care provider.

## 2015-05-19 LAB — HIV ANTIBODY (ROUTINE TESTING W REFLEX): HIV 1&2 Ab, 4th Generation: NONREACTIVE

## 2015-05-19 LAB — CYTOLOGY - PAP

## 2015-05-19 LAB — HEPATITIS C ANTIBODY: HCV Ab: NEGATIVE

## 2015-05-24 ENCOUNTER — Encounter: Payer: Self-pay | Admitting: Family Medicine

## 2015-05-24 NOTE — Progress Notes (Signed)
   Subjective:    Patient ID: [name] [name], female    DOB: 04-27-1961, 54 y.o.   MRN: [id number]  HPI Patient is in for annual physical exam. No other health concerns are expressed or addressed at the visit. Recent labs, if available are reviewed. Immunization is reviewed , and  updated if needed. Reports marlked improvement in knee pain since last visit  Review of Systems See HPI     Objective:   Physical Exam BP 132/88 mmHg  Pulse 86  Resp 18  Ht 5\' 2"  (1.575 m)  Wt 201 lb 1.9 oz (91.227 kg)  BMI 36.78 kg/m2  SpO2 97%    Pleasant well nourished female, alert and oriented x 3, in no cardio-pulmonary distress. Afebrile. HEENT No facial trauma or asymetry. Sinuses non tender.  Extra occullar muscles intact, pupils equally reactive to light. External ears normal, tympanic membranes clear. Oropharynx moist, no exudate,fairly  good dentition. Neck: supple, no adenopathy,JVD or thyromegaly.No bruits.  Chest: Clear to ascultation bilaterally.No crackles or wheezes. Non tender to palpation  Breast: No asymetry,no masses or lumps. No tenderness. No nipple discharge or inversion. No axillary or supraclavicular adenopathy  Cardiovascular system; Heart sounds normal,  S1 and  S2 ,no S3.  No murmur, or thrill. Apical beat not displaced Peripheral pulses normal.  Abdomen: Soft, non tender, no organomegaly or masses. No bruits. Bowel sounds normal. No guarding, tenderness or rebound.  Rectal:  Normal sphincter tone. No mass.No rectal masses.  Guaiac negative stool.  GU: External genitalia normal female genitalia , female distribution of hair. No lesions. Urethral meatus normal in size, no  Prolapse, no lesions visibly  Present. Bladder non tender. Vagina pink and moist , with no visible lesions , discharge present . Adequate pelvic support no  cystocele or rectocele noted Cervix pink and appears healthy, no lesions or ulcerations noted, no discharge noted from  os Uterus normal size, no adnexal masses, no cervical motion or adnexal tenderness.   Musculoskeletal exam: Full ROM of spine, hips , shoulders and knees. No deformity ,swelling or crepitus noted. No muscle wasting or atrophy.   Neurologic: Cranial nerves 2 to 12 intact. Power, tone ,sensation and reflexes normal throughout. No disturbance in gait. No tremor.  Skin: Intact, no ulceration, erythema , scaling or rash noted. Pigmentation normal throughout  Psych; Normal mood and affect. Judgement and concentration normal         Assessment & Plan:  Annual physical exam Annual exam as documented. Counseling done  re healthy lifestyle involving commitment to 150 minutes exercise per week, heart healthy diet, and attaining healthy weight.The importance of adequate sleep also discussed. Regular seat belt use and home safety, is also discussed. Changes in health habits are decided on by the patient with goals and time frames  set for achieving them. Immunization and cancer screening needs are specifically addressed at this visit.   Nicotine dependence Patient counseled for approximately 5 minutes regarding the health risks of ongoing nicotine use, specifically all types of cancer, heart disease, stroke and respiratory failure. The options available for help with cessation ,the behavioral changes to assist the process, and the option to either gradully reduce usage  Or abruptly stop.is also discussed. Pt is also encouraged to set specific goals in number of cigarettes used daily, as well as to set a quit date.  Number of cigarettes/cigars currently smoking daily:3

## 2015-06-06 ENCOUNTER — Encounter: Payer: Self-pay | Admitting: Family Medicine

## 2015-06-06 NOTE — Assessment & Plan Note (Signed)
Patient counseled for approximately 5 minutes regarding the health risks of ongoing nicotine use, specifically all types of cancer, heart disease, stroke and respiratory failure. The options available for help with cessation ,the behavioral changes to assist the process, and the option to either 3

## 2015-06-06 NOTE — Assessment & Plan Note (Signed)
Controlled, no change in medication DASH diet and commitment to daily physical activity for a minimum of 30 minutes discussed and encouraged, as a part of hypertension management. The importance of attaining a healthy weight is also discussed.  BP/Weight 05/17/2015 04/26/2015 01/25/2015 12/21/2014 10/27/2014 10/26/2014 53/64/6803  Systolic BP 212 248 250 037 048 889 169  Diastolic BP 88 82 79 84 82 81 84  Wt. (Lbs) 201.12 202 199.8 196 194.04 185 200.4  BMI 36.78 36.94 36.53 35.84 35.48 33.83 36.64

## 2015-06-21 ENCOUNTER — Telehealth: Payer: Self-pay | Admitting: Family Medicine

## 2015-06-21 NOTE — Telephone Encounter (Signed)
FMLA forms have been held for a few weeks due to non payment. Patient just paid for the forms today. They take up to a week to complete after payment is received

## 2015-06-21 NOTE — Telephone Encounter (Signed)
Patient is calling in regards to FMLA forms, please advise?

## 2015-08-11 ENCOUNTER — Other Ambulatory Visit: Payer: Self-pay | Admitting: Family Medicine

## 2015-09-16 ENCOUNTER — Ambulatory Visit: Payer: 59 | Admitting: Family Medicine

## 2015-09-23 ENCOUNTER — Other Ambulatory Visit: Payer: Self-pay | Admitting: Family Medicine

## 2015-10-26 MED FILL — POTASSIUM CL ER 20 MEQ TAB: 20 | 90 days supply | Qty: 90 | Fill #4

## 2015-10-26 MED FILL — IBUPROFEN 800 MG TABLET: 800 | 30 days supply | Qty: 30 | Fill #1

## 2015-11-17 ENCOUNTER — Telehealth: Payer: Self-pay | Admitting: Family Medicine

## 2015-11-17 DIAGNOSIS — E785 Hyperlipidemia, unspecified: Secondary | ICD-10-CM

## 2015-11-17 DIAGNOSIS — R7303 Prediabetes: Secondary | ICD-10-CM

## 2015-11-17 NOTE — Telephone Encounter (Signed)
Pls contact pt , needs fasting lipid, cmp and hBa1C and appt in March, last OV was in 04/2015. Plas also sched appt if she agrees

## 2015-11-22 NOTE — Addendum Note (Signed)
Addended by: Eual Fines on: 11/22/2015 11:00 AM   Modules accepted: Orders

## 2015-11-22 NOTE — Telephone Encounter (Signed)
Patient aware. Scheduled and order faxed to lab

## 2015-12-08 DIAGNOSIS — R7303 Prediabetes: Secondary | ICD-10-CM | POA: Diagnosis not present

## 2015-12-08 DIAGNOSIS — R7309 Other abnormal glucose: Secondary | ICD-10-CM | POA: Diagnosis not present

## 2015-12-08 DIAGNOSIS — E785 Hyperlipidemia, unspecified: Secondary | ICD-10-CM | POA: Diagnosis not present

## 2015-12-09 LAB — LIPID PANEL
CHOL/HDL RATIO: 3.2 ratio (ref <=5.0)
Cholesterol: 179 mg/dL (ref 125–200)
HDL: 56 mg/dL (ref >=46)
LDL Cholesterol: 106 mg/dL (ref <130)
TRIGLYCERIDES: 85 mg/dL (ref <150)
VLDL: 17 mg/dL (ref <30)

## 2015-12-09 LAB — COMPREHENSIVE METABOLIC PANEL
ALBUMIN: 4.1 g/dL (ref 3.6–5.1)
ALK PHOS: 59 U/L (ref 33–130)
ALT: 14 U/L (ref 6–29)
AST: 14 U/L (ref 10–35)
BILIRUBIN TOTAL: 0.3 mg/dL (ref 0.2–1.2)
BUN: 14 mg/dL (ref 7–25)
CO2: 30 mmol/L (ref 20–31)
CREATININE: 0.63 mg/dL (ref 0.50–1.05)
Calcium: 9.5 mg/dL (ref 8.6–10.4)
Chloride: 101 mmol/L (ref 98–110)
GLUCOSE: 85 mg/dL (ref 65–99)
Potassium: 4 mmol/L (ref 3.5–5.3)
SODIUM: 141 mmol/L (ref 135–146)
Total Protein: 6.8 g/dL (ref 6.1–8.1)

## 2015-12-09 LAB — HEMOGLOBIN A1C
Hgb A1c MFr Bld: 6.2 % — ABNORMAL HIGH (ref <5.7)
Mean Plasma Glucose: 131 mg/dL — ABNORMAL HIGH (ref <117)

## 2015-12-14 DIAGNOSIS — Z0289 Encounter for other administrative examinations: Secondary | ICD-10-CM

## 2015-12-22 ENCOUNTER — Other Ambulatory Visit: Payer: Self-pay | Admitting: Family Medicine

## 2015-12-22 MED FILL — HYDROCHLOROTHIAZIDE 25 MG T: 25 | 90 days supply | Qty: 90 | Fill #1

## 2015-12-23 MED FILL — IBUPROFEN 800 MG TABLET: 800 | 30 days supply | Qty: 30 | Fill #0

## 2016-01-03 ENCOUNTER — Ambulatory Visit (INDEPENDENT_AMBULATORY_CARE_PROVIDER_SITE_OTHER): Payer: 59 | Admitting: Family Medicine

## 2016-01-03 ENCOUNTER — Encounter: Payer: Self-pay | Admitting: Family Medicine

## 2016-01-03 VITALS — BP 138/90 | HR 89 | Resp 16 | Ht 62.0 in | Wt 204.1 lb

## 2016-01-03 DIAGNOSIS — M26629 Arthralgia of temporomandibular joint, unspecified side: Secondary | ICD-10-CM | POA: Insufficient documentation

## 2016-01-03 DIAGNOSIS — E559 Vitamin D deficiency, unspecified: Secondary | ICD-10-CM

## 2016-01-03 DIAGNOSIS — E785 Hyperlipidemia, unspecified: Secondary | ICD-10-CM

## 2016-01-03 DIAGNOSIS — E8881 Metabolic syndrome: Secondary | ICD-10-CM

## 2016-01-03 DIAGNOSIS — R7303 Prediabetes: Secondary | ICD-10-CM

## 2016-01-03 DIAGNOSIS — J3089 Other allergic rhinitis: Secondary | ICD-10-CM

## 2016-01-03 DIAGNOSIS — I10 Essential (primary) hypertension: Secondary | ICD-10-CM

## 2016-01-03 DIAGNOSIS — F17208 Nicotine dependence, unspecified, with other nicotine-induced disorders: Secondary | ICD-10-CM

## 2016-01-03 DIAGNOSIS — IMO0001 Reserved for inherently not codable concepts without codable children: Secondary | ICD-10-CM

## 2016-01-03 MED ORDER — LORATADINE 10 MG PO CAPS: 1.0000 | ORAL_CAPSULE | Freq: Every day | ORAL | Status: DC

## 2016-01-03 MED ORDER — METHYLPREDNISOLONE ACETATE 80 MG/ML IJ SUSP
80.0000 mg | Freq: Once | INTRAMUSCULAR | Status: AC
  Administered 2016-01-03: 80 mg via INTRAMUSCULAR

## 2016-01-03 MED ORDER — PREDNISONE 5 MG PO TABS: 5.0000 mg | ORAL_TABLET | Freq: Two times a day (BID) | ORAL | Status: AC

## 2016-01-03 MED ORDER — METFORMIN HCL 500 MG PO TABS: 500.0000 mg | ORAL_TABLET | Freq: Every day | ORAL | Status: DC

## 2016-01-03 MED FILL — metFORMIN HCL 500 MG TABS: 500 | 90 days supply | Qty: 90 | Fill #0

## 2016-01-03 MED FILL — predniSONE 5 MG TABS: 5 | 5 days supply | Qty: 10 | Fill #0

## 2016-01-03 NOTE — Assessment & Plan Note (Signed)
Advised use of tylenol and cool compress

## 2016-01-03 NOTE — Assessment & Plan Note (Signed)

## 2016-01-03 NOTE — Assessment & Plan Note (Signed)
Updated lab needed at/ before next visit.   

## 2016-01-03 NOTE — Assessment & Plan Note (Signed)
Improved with dietary change, continue same, pt applauded on this

## 2016-01-03 NOTE — Assessment & Plan Note (Signed)
Elevated diastolic pressure DASH diet and commitment to daily physical activity for a minimum of 30 minutes discussed and encouraged, as a part of hypertension management. The importance of attaining a healthy weight is also discussed.  BP/Weight 01/03/2016 05/17/2015 04/26/2015 01/25/2015 12/21/2014 0000000 [id number]  Systolic BP 0000000 Q000111Q [id number] Q000111Q 0000000 123456 Q000111Q  Diastolic BP 90 88 82 79 84 82 81  Wt. (Lbs) 204.12 201.12 202 199.8 196 194.04 185  BMI 37.32 36.78 36.94 36.53 35.84 35.48 33.83      No med chhange

## 2016-01-03 NOTE — Patient Instructions (Addendum)
Annual physical exam August 23 or after  Call if you need me before  Blood sugar is worse, new is metformin daily, reduce sweets and carbs in diet , eat more fresh / frozen fruit and veg  It is important that you exercise regularly at least 30 minutes 7 times a week. If you develop chest pain, have severe difficulty breathing, or feel very tired, stop exercising immediately and seek medical attention   For allergies prednisone for 5 days, depo medrol imnjection in office and claritin sent, contnue flonase  Weight loss goal of 6 pound to 8 pounds  Improved cholesterol is good  HBA1C, chem 7 and EGFr, cBC, TSH , Vit D non fasting for next visit  Thank you  for choosing [facility]. We consider it a privelige to serve you.  Delivering excellent health care in a caring and  compassionate way is our goal.  Partnering with you,  so that together we can achieve this goal is our strategy.    Temporomandibular Joint Syndrome Temporomandibular joint (TMJ) syndrome is a condition that affects the joints between your jaw and your skull. The TMJs are located near your ears and allow your jaw to open and close. These joints and the nearby muscles are involved in all movements of the jaw. People with TMJ syndrome have pain in the area of these joints and muscles. Chewing, biting, or other movements of the jaw can be difficult or painful. TMJ syndrome can be caused by various things. In many cases, the condition is mild and goes away within a few weeks. For some people, the condition can become a long-term problem. CAUSES Possible causes of TMJ syndrome include:  Grinding your teeth or clenching your jaw. Some people do this when they are under stress.  Arthritis.  Injury to the jaw.  Head or neck injury.  Teeth or dentures that are not aligned well. In some cases, the cause of TMJ syndrome may not be known. SIGNS AND SYMPTOMS The most common symptom is an aching pain on the side  of the head in the area of the TMJ. Other symptoms may include:  Pain when moving your jaw, such as when chewing or biting.  Being unable to open your jaw all the way.  Making a clicking sound when you open your mouth.  Headache.  Earache.  Neck or shoulder pain. DIAGNOSIS Diagnosis can usually be made based on your symptoms, your medical history, and a physical exam. Your health care provider may check the range of motion of your jaw. Imaging tests, such as X-rays or an MRI, are sometimes done. You may need to see your dentist to determine if your teeth and jaw are lined up correctly. TREATMENT TMJ syndrome often goes away on its own. If treatment is needed, the options may include:  Eating soft foods and applying ice or heat.  Medicines to relieve pain or inflammation.  Medicines to relax the muscles.  A splint, bite plate, or mouthpiece to prevent teeth grinding or jaw clenching.  Relaxation techniques or counseling to help reduce stress.  Transcutaneous electrical nerve stimulation (TENS). This helps to relieve pain by applying an electrical current through the skin.  Acupuncture. This is sometimes helpful to relieve pain.  Jaw surgery. This is rarely needed. HOME CARE INSTRUCTIONS  Take medicines only as directed by your health care provider.  Eat a soft diet if you are having trouble chewing.  Apply ice to the painful area.  Put ice in  a plastic bag.  Place a towel between your skin and the bag.  Leave the ice on for 20 minutes, 2-3 times a day.  Apply a warm compress to the painful area as directed.  Massage your jaw area and perform any jaw stretching exercises as recommended by your health care provider.  If you were given a mouthpiece or bite plate, wear it as directed.  Avoid foods that require a lot of chewing. Do not chew gum.  Keep all follow-up visits as directed by your health care provider. This is important. SEEK MEDICAL CARE IF:  You are  having trouble eating.  You have new or worsening symptoms. SEEK IMMEDIATE MEDICAL CARE IF:  Your jaw locks open or closed.   This information is not intended to replace advice given to you by your health care provider. Make sure you discuss any questions you have with your health care provider.   Document Released: 06/06/2001 Document Revised: 10/02/2014 Document Reviewed: 04/16/2014 Elsevier Interactive Patient Education [employer].

## 2016-01-03 NOTE — Assessment & Plan Note (Signed)
The increased risk of cardiovascular disease associated with this diagnosis, and the need to consistently work on lifestyle to change this is discussed. Following  a  heart healthy diet ,commitment to 30 minutes of exercise at least 5 days per week, as well as control of blood sugar and cholesterol , and achieving a healthy weight are all the areas to be addressed .  

## 2016-01-03 NOTE — Assessment & Plan Note (Addendum)
Patient educated about the importance of limiting  Carbohydrate intake , the need to commit to daily physical activity for a minimum of 30 minutes , and to commit weight loss. The fact that changes in all these areas will reduce or eliminate all together the development of diabetes is stressed.  Deteriorated, start daily metformin  Diabetic Labs Latest Ref Rng 12/08/2015 05/17/2015 01/25/2015 11/04/2014 08/10/2014  HbA1c <5.7 % 6.2(H) 6.1(H) - - 6.1(H)  Chol 125 - 200 mg/dL 179 213(H) - - 191  HDL >=46 mg/dL 56 65 - - 52  Calc LDL <130 mg/dL 106 135(H) - - 118(H)  Triglycerides <150 mg/dL 85 63 - - 103  Creatinine 0.50 - 1.05 mg/dL 0.63 0.63 0.53 0.80 0.65   BP/Weight 01/03/2016 05/17/2015 04/26/2015 01/25/2015 12/21/2014 0000000 [id number]  Systolic BP 0000000 Q000111Q [id number] Q000111Q 0000000 123456 Q000111Q  Diastolic BP 90 88 82 79 84 82 81  Wt. (Lbs) 204.12 201.12 202 199.8 196 194.04 185  BMI 37.32 36.78 36.94 36.53 35.84 35.48 33.83   No flowsheet data found.

## 2016-01-03 NOTE — Assessment & Plan Note (Signed)
Deteriorated. Patient re-educated about  the importance of commitment to a  minimum of 150 minutes of exercise per week.  The importance of healthy food choices with portion control discussed. Encouraged to start a food diary, count calories and to consider  joining a support group. Sample diet sheets offered. Goals set by the patient for the next several months.   Weight /BMI 01/03/2016 05/17/2015 04/26/2015  WEIGHT 204 lb 1.9 oz 201 lb 1.9 oz 202 lb  HEIGHT 5\' 2"  5\' 2"  5\' 2"   BMI 37.32 kg/m2 36.78 kg/m2 36.94 kg/m2    Current exercise per week 90 minutes.

## 2016-01-03 NOTE — Progress Notes (Signed)
Subjective:    Patient ID: [name] [name], female    DOB: [birth year], 55 y.o.   MRN: [id number]  HPI   [name] [name]     MRN: [id number]      DOB: 04/07/1961   HPI [name] [name] is here for follow up and re-evaluation of chronic medical conditions, medication management and review of any available recent lab and radiology data.  Preventive health is updated, specifically  Cancer screening and Immunization.   Questions or concerns regarding consultations or procedures which the PT has had in the interim are  addressed. The PT denies any adverse reactions to current medications since the last visit.  3 week h/o increased and uncontrolled allergy symptoms, periorbital pain, excess tearing , and nasal congestion, using flonase , wants additional; help 3 pound weight gain but no commitment to healthy lifestyle in past months   ROS Denies recent fever or chills. C/o bilateral jaw pain with direct pressure positive h/o chewing gum. Denies chest congestion, productive cough or wheezing. Denies chest pains, palpitations and leg swelling Denies abdominal pain, nausea, vomiting,diarrhec/o pain over bioth jaw joints esp when opening her mouth  Denies headaches, seizures, numbness, or tingling. Denies depression, anxiety or insomnia. Denies skin break down or rash.   PE  BP 138/90 mmHg  Pulse 89  Resp 16  Ht 5\' 2"  (1.575 m)  Wt 204 lb 1.9 oz (92.588 kg)  BMI 37.32 kg/m2  SpO2 97%  Patient alert and oriented and in no cardiopulmonary distress.  HEENT: No facial asymmetry, EOMI,   oropharynx pink and moist.  Neck supple no JVD, no mass. Conjuctival injection and tearing, nasal erythema and edema.Tender to palption over both tMJoints Chest: Clear to auscultation bilaterally.  CVS: S1, S2 no murmurs, no S3.Regular rate.  ABD: Soft non tender.   Ext: No edema  MS: Adequate ROM spine, shoulders, hips and knees.  Skin: Intact, no ulcerations or rash noted.  Psych: Good eye contact,  normal affect. Memory intact not anxious or depressed appearing.  CNS: CN 2-12 intact, power,  normal throughout.no focal deficits noted.   Assessment & Plan  Obesity, Class II, BMI 35-39.9, with comorbidity Deteriorated. Patient re-educated about  the importance of commitment to a  minimum of 150 minutes of exercise per week.  The importance of healthy food choices with portion control discussed. Encouraged to start a food diary, count calories and to consider  joining a support group. Sample diet sheets offered. Goals set by the patient for the next several months.   Weight /BMI 01/03/2016 05/17/2015 04/26/2015  WEIGHT 204 lb 1.9 oz 201 lb 1.9 oz 202 lb  HEIGHT 5\' 2"  5\' 2"  5\' 2"   BMI 37.32 kg/m2 36.78 kg/m2 36.94 kg/m2    Current exercise per week 90 minutes.   Prediabetes Patient educated about the importance of limiting  Carbohydrate intake , the need to commit to daily physical activity for a minimum of 30 minutes , and to commit weight loss. The fact that changes in all these areas will reduce or eliminate all together the development of diabetes is stressed.  Deteriorated, start daily metformin  Diabetic Labs Latest Ref Rng 12/08/2015 05/17/2015 01/25/2015 11/04/2014 08/10/2014  HbA1c <5.7 % 6.2(H) 6.1(H) - - 6.1(H)  Chol 125 - 200 mg/dL 179 213(H) - - 191  HDL >=46 mg/dL 56 65 - - 52  Calc LDL <130 mg/dL 106 135(H) - - 118(H)  Triglycerides <150 mg/dL 85 63 - - 103  Creatinine 0.50 -  1.05 mg/dL 0.63 0.63 0.53 0.80 0.65   BP/Weight 01/03/2016 05/17/2015 04/26/2015 01/25/2015 12/21/2014 0000000 [id number]  Systolic BP 0000000 Q000111Q XX123456 Q000111Q 0000000 123456 Q000111Q  Diastolic BP 90 88 82 79 84 82 81  Wt. (Lbs) 204.12 201.12 202 199.8 196 194.04 185  BMI 37.32 36.78 36.94 36.53 35.84 35.48 33.83   No flowsheet data found.     Nicotine dependence Patient counseled for approximately 5 minutes regarding the health risks of ongoing nicotine use, specifically all types of cancer, heart disease, stroke  and respiratory failure. The options available for help with cessation ,the behavioral changes to assist the process, and the option to either gradully reduce usage  Or abruptly stop.is also discussed. Pt is also encouraged to set specific goals in number of cigarettes used daily, as well as to set a quit date.  Number of cigarettes/cigars currently smoking daily:3   Allergic rhinitis Uncontrolled, depo medrol in office  Depo medrol in office  Essential hypertension Elevated diastolic pressure DASH diet and commitment to daily physical activity for a minimum of 30 minutes discussed and encouraged, as a part of hypertension management. The importance of attaining a healthy weight is also discussed.  BP/Weight 01/03/2016 05/17/2015 04/26/2015 01/25/2015 12/21/2014 0000000 [id number]  Systolic BP 0000000 Q000111Q XX123456 Q000111Q 0000000 123456 Q000111Q  Diastolic BP 90 88 82 79 84 82 81  Wt. (Lbs) 204.12 201.12 202 199.8 196 194.04 185  BMI 37.32 36.78 36.94 36.53 35.84 35.48 33.83      No med chhange  Hyperlipemia Improved with dietary change, continue same, pt applauded on this  Metabolic syndrome X The increased risk of cardiovascular disease associated with this diagnosis, and the need to consistently work on lifestyle to change this is discussed. Following  a  heart healthy diet ,commitment to 30 minutes of exercise at least 5 days per week, as well as control of blood sugar and cholesterol , and achieving a healthy weight are all the areas to be addressed .   Vitamin D deficiency Updated lab needed at/ before next visit.   TMJ syndrome Advised use of tylenol and cool compress      Review of Systems     Objective:   Physical Exam        Assessment & Plan:

## 2016-01-03 NOTE — Assessment & Plan Note (Signed)
Uncontrolled, depo medrol in office  Depo medrol in office

## 2016-01-14 IMAGING — US US CAROTID DUPLEX BILAT
1 series · 13 of 24 positions shown · non-contrast
Comparison: 06/28/2005

CLINICAL DATA: Neck bruit

EXAM:
BILATERAL CAROTID DUPLEX ULTRASOUND
TECHNIQUE: Gray scale imaging, color Doppler and duplex ultrasound were
performed of bilateral carotid and vertebral arteries in the neck.

[Series 1: us carotid duplex bilat · 0.04mm/px · 13 of 71 slices shown]
[im 1/71]
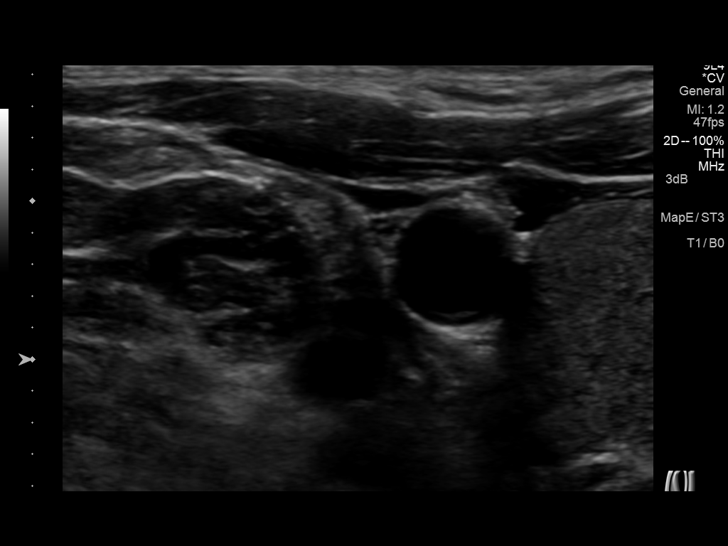
[im 7/71]
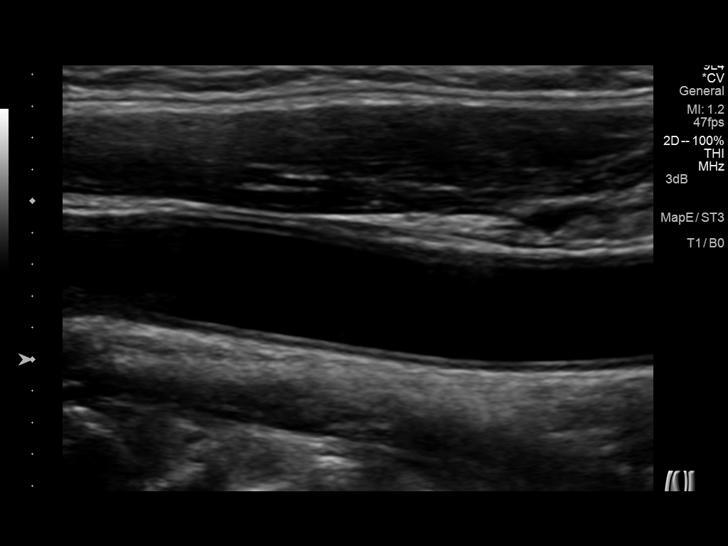
[im 13/71]
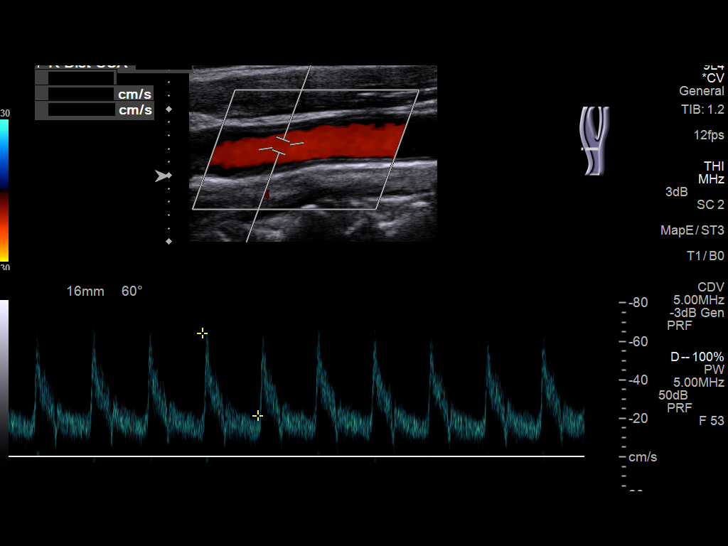
[im 19/71]
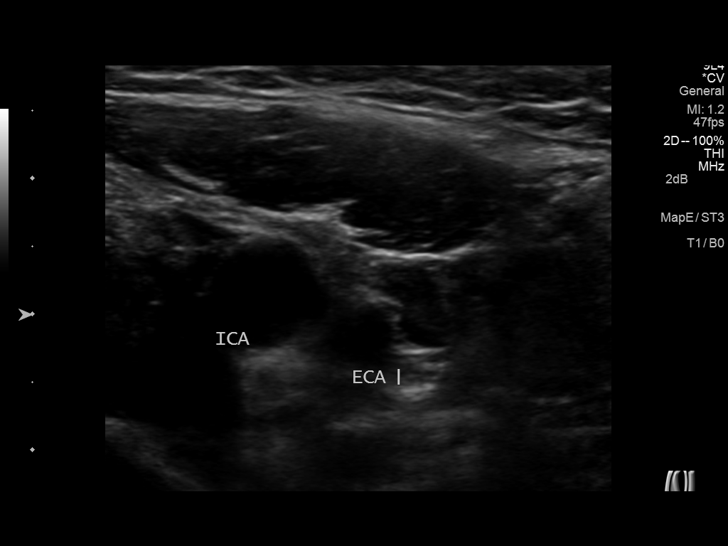
[im 25/71]
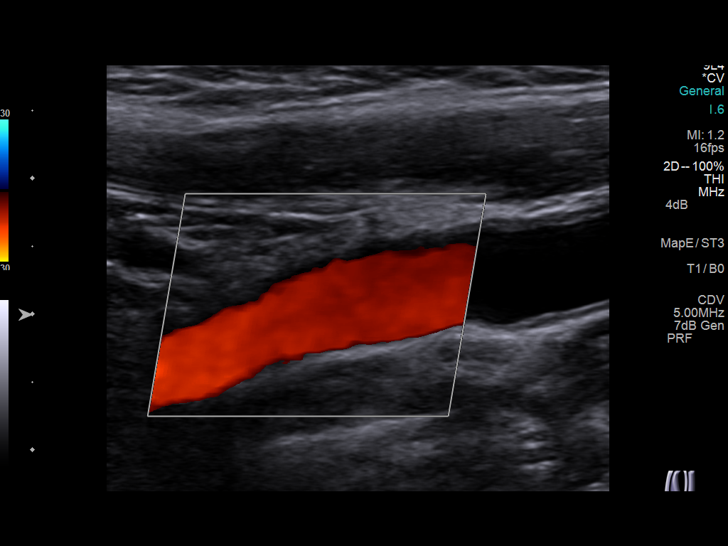
[im 31/71]
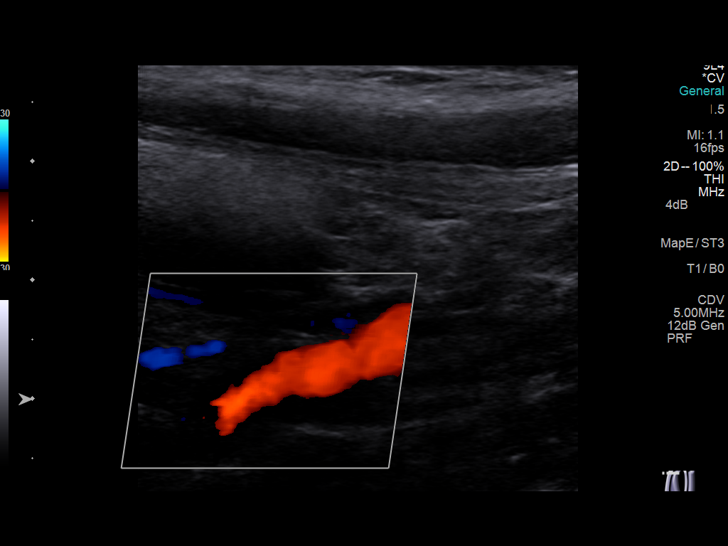
[im 37/71]
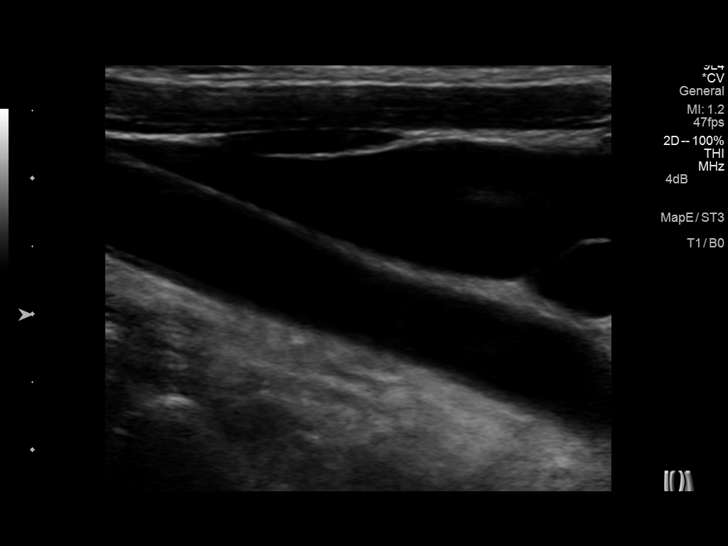
[im 40/71]
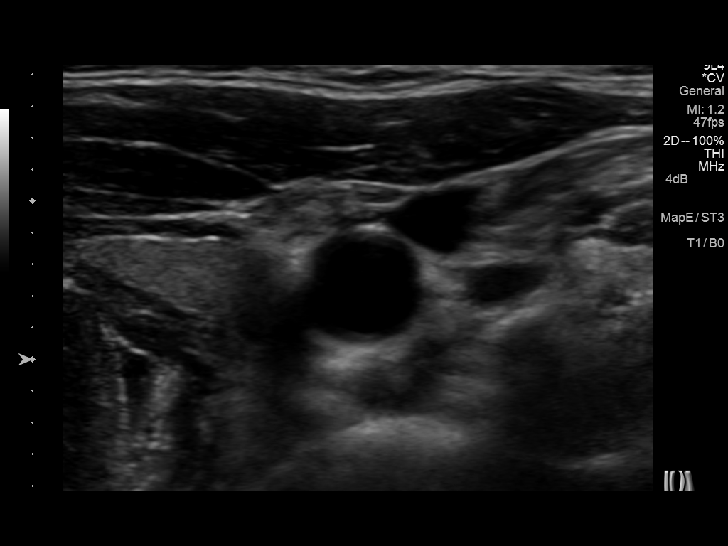
[im 46/71]
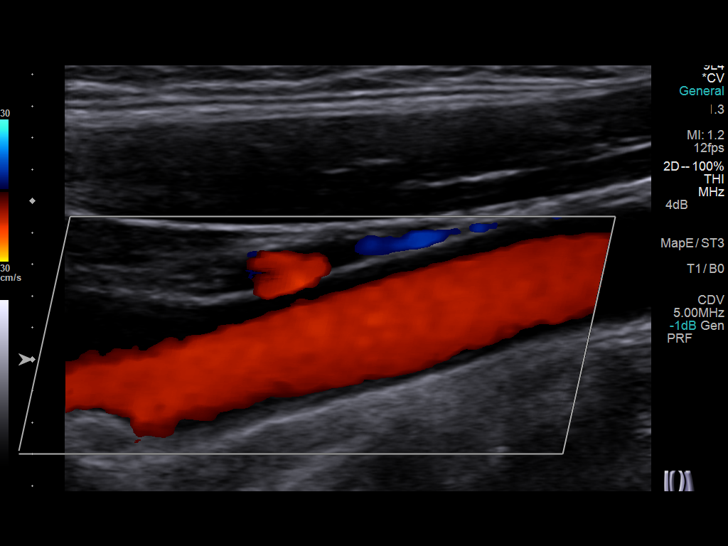
[im 52/71]
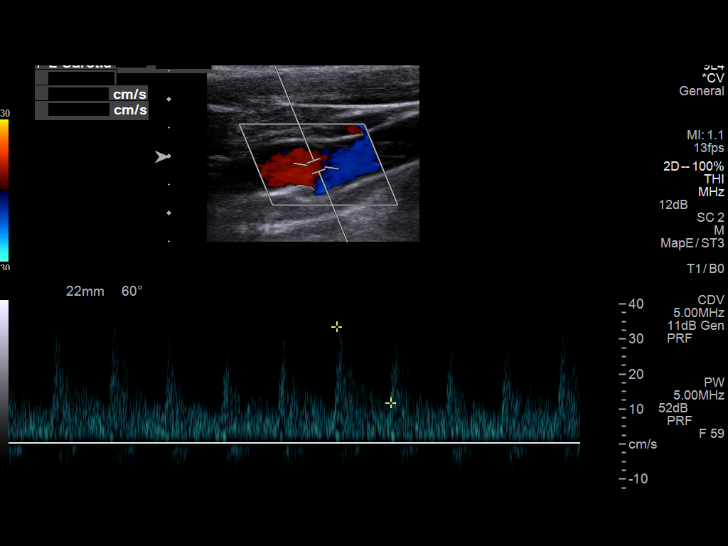
[im 58/71]
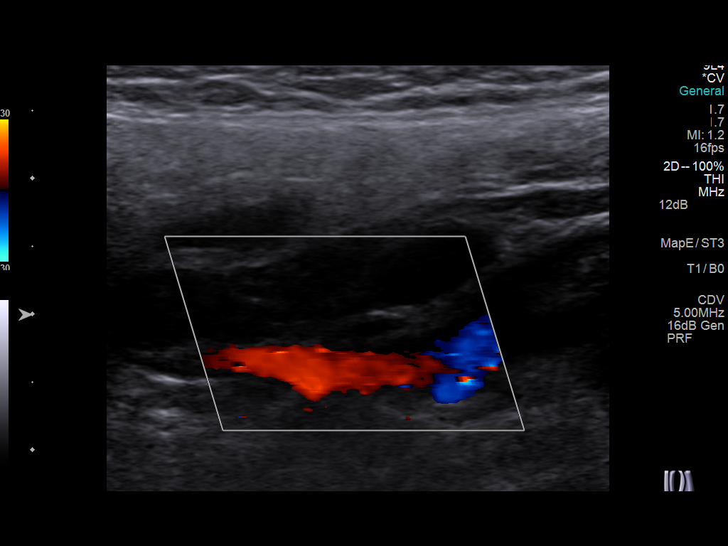
[im 64/71]
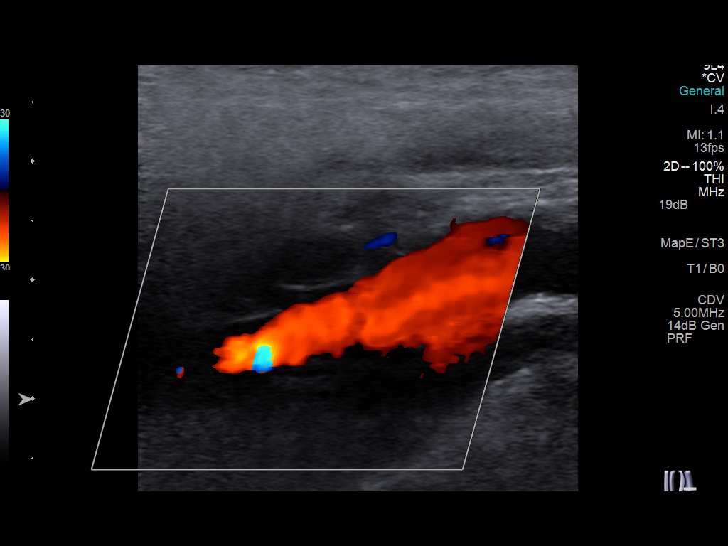
[im 71/71]
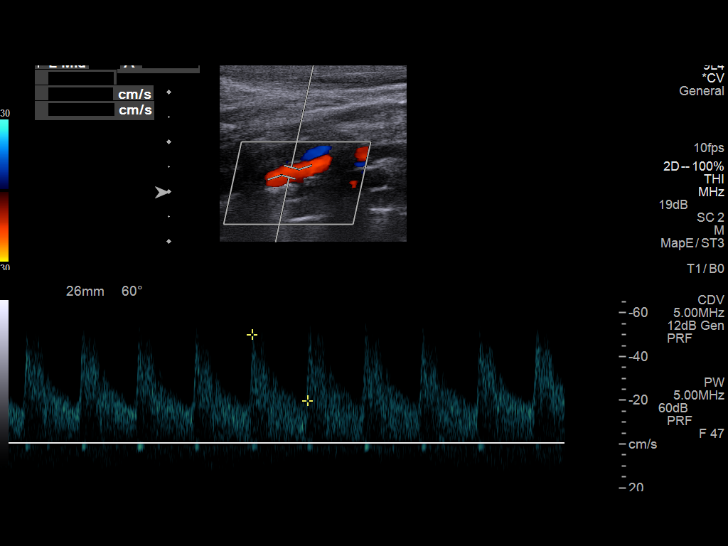

[13 of 24 positions shown; findings below may reference images not displayed]

FINDINGS: Criteria: Quantification of carotid stenosis is based on velocity
parameters that correlate the residual internal carotid diameter
with NASCET-based stenosis levels, using the diameter of the distal
internal carotid lumen as the denominator for stenosis measurement.

The following velocity measurements were obtained:

RIGHT

ICA:  76 cm/sec

CCA:  69 cm/sec

SYSTOLIC ICA/CCA RATIO:

DIASTOLIC ICA/CCA RATIO:

ECA:  97 cm/sec

LEFT

ICA:  80 cm/sec

CCA:  74 cm/sec

SYSTOLIC ICA/CCA RATIO:

DIASTOLIC ICA/CCA RATIO:

ECA:  96 cm/sec

RIGHT CAROTID ARTERY: Minimal intimal thickening in the bulb. Low
resistance internal carotid Doppler pattern.

RIGHT VERTEBRAL ARTERY:  Antegrade with a normal Doppler pattern.

LEFT CAROTID ARTERY: Mild calcified plaque in the bulb with intimal
thickening. Low resistance internal carotid Doppler pattern.

LEFT VERTEBRAL ARTERY:  Antegrade with a normal Doppler pattern.
IMPRESSION: Less than 50% stenosis in the right and left internal carotid
arteries.

## 2016-01-24 ENCOUNTER — Other Ambulatory Visit (HOSPITAL_COMMUNITY): Payer: 59

## 2016-01-24 ENCOUNTER — Ambulatory Visit (HOSPITAL_COMMUNITY): Payer: 59 | Admitting: Hematology & Oncology

## 2016-01-25 ENCOUNTER — Ambulatory Visit (HOSPITAL_COMMUNITY): Payer: 59 | Admitting: Hematology & Oncology

## 2016-01-25 ENCOUNTER — Other Ambulatory Visit (HOSPITAL_COMMUNITY): Payer: 59

## 2016-02-01 ENCOUNTER — Other Ambulatory Visit: Payer: Self-pay | Admitting: Family Medicine

## 2016-02-03 MED FILL — KLOR-CON M20 TABLET: 20 | 90 days supply | Qty: 90 | Fill #0

## 2016-02-10 ENCOUNTER — Ambulatory Visit (INDEPENDENT_AMBULATORY_CARE_PROVIDER_SITE_OTHER): Payer: 59 | Admitting: Family Medicine

## 2016-02-10 ENCOUNTER — Ambulatory Visit (HOSPITAL_COMMUNITY)
Admission: RE | Admit: 2016-02-10 | Discharge: 2016-02-10 | Disposition: A | Discharge status: Home / Self Care | Payer: 59 | Source: Ambulatory Visit | Attending: Family Medicine | Admitting: Family Medicine

## 2016-02-10 ENCOUNTER — Encounter: Payer: Self-pay | Admitting: Family Medicine

## 2016-02-10 VITALS — BP 144/96 | HR 87 | Resp 16 | Ht 62.0 in | Wt 201.4 lb

## 2016-02-10 DIAGNOSIS — M542 Cervicalgia: Secondary | ICD-10-CM | POA: Diagnosis not present

## 2016-02-10 DIAGNOSIS — G8929 Other chronic pain: Secondary | ICD-10-CM | POA: Insufficient documentation

## 2016-02-10 DIAGNOSIS — R9082 White matter disease, unspecified: Secondary | ICD-10-CM | POA: Diagnosis not present

## 2016-02-10 DIAGNOSIS — I10 Essential (primary) hypertension: Secondary | ICD-10-CM

## 2016-02-10 DIAGNOSIS — F17208 Nicotine dependence, unspecified, with other nicotine-induced disorders: Secondary | ICD-10-CM

## 2016-02-10 DIAGNOSIS — G4452 New daily persistent headache (NDPH): Secondary | ICD-10-CM | POA: Diagnosis not present

## 2016-02-10 DIAGNOSIS — G44021 Chronic cluster headache, intractable: Secondary | ICD-10-CM

## 2016-02-10 DIAGNOSIS — R51 Headache: Secondary | ICD-10-CM | POA: Diagnosis not present

## 2016-02-10 HISTORY — DX: Cervicalgia: M54.2

## 2016-02-10 HISTORY — DX: Other chronic pain: G89.29

## 2016-02-10 MED ORDER — BACLOFEN 10 MG PO TABS: 10.0000 mg | ORAL_TABLET | Freq: Two times a day (BID) | ORAL | Status: DC

## 2016-02-10 MED ORDER — AMLODIPINE BESYLATE 2.5 MG PO TABS: 2.5000 mg | ORAL_TABLET | Freq: Every day | ORAL | Status: DC

## 2016-02-10 MED FILL — AMLODIPINE BESYLATE 2.5 MG: 2.5 | 90 days supply | Qty: 90 | Fill #0

## 2016-02-10 MED FILL — BACLOFEN 10 MG TABLET: 10 | 15 days supply | Qty: 30 | Fill #0

## 2016-02-10 NOTE — Patient Instructions (Signed)
F/u in 6 weeks, call if you need me sooner  Head scan and evaluation by neurologist for headaches  Xray of neck  New additional blood pressure medication amlodipine daily  \For neck spasm and may help reduce headache frequency is baclofen twice daily  PLEASE no more cigarettes starting today, this will improve headaches, blood pressure and lower stroke risk

## 2016-02-10 NOTE — Assessment & Plan Note (Signed)

## 2016-02-10 NOTE — Progress Notes (Signed)
   Subjective:    Patient ID: [name] [name], female    DOB: 1961-03-11, 55 y.o.   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 Denies sinus pressure, nasal congestion, ear pain or sore throat. Denies chest congestion, productive cough or wheezing. Denies chest pains, palpitations and leg swelling Denies abdominal pain, nausea, vomiting,diarrhea or constipation.   Denies dysuria, frequency, hesitancy or incontinence.  Denies depression, anxiety or insomnia. Denies skin break down or rash.         Objective:   Physical Exam BP 144/96 mmHg  Pulse 87  Resp 16  Ht 5\' 2"  (1.575 m)  Wt 201 lb 6.4 oz (91.354 kg)  BMI 36.83 kg/m2  SpO2 96% Pt anxious  Patient alert and oriented and in no cardiopulmonary distress.  HEENT: No facial asymmetry, EOMI,   oropharynx pink and moist.  Neck decreased ROM with left trapezius spasm, no sinus tenderness,  EOMI CVS: S1, S2 no murmurs, no S3.Regular rate.  ABD: Soft non tender.   Ext: No edema  MS: Adequate ROM spine, shoulders, hips and knees.  Skin: Intact, no ulcerations or rash noted.  Psych: Good eye contact, normal affect. Memory intact anxious not  depressed appearing.  CNS: CN 2-12 intact, power,  normal throughout.no focal deficits noted.       Assessment & Plan:  Nicotine dependence Patient counseled for approximately 5 minutes regarding the health risks of ongoing nicotine use, specifically all types of cancer, heart disease,  stroke and respiratory failure. The options available for help with cessation ,the behavioral changes to assist the process, and the option to either gradully reduce usage  Or abruptly stop.is also discussed. Pt is also encouraged to set specific goals in number of cigarettes used daily, as well as to set a quit date.  Number of cigarettes/cigars currently smoking daily: 3   Essential hypertension Uncontrolled, add amlodipine daily DASH diet and commitment to daily physical activity for a minimum of 30 minutes discussed and encouraged, as a part of hypertension management. The importance of attaining a healthy weight is also discussed.  BP/Weight 02/16/2016 02/10/2016 01/03/2016 05/17/2015 04/26/2015 01/25/2015 123XX123  Systolic BP 123XX123 123456 0000000 Q000111Q XX123456 Q000111Q 0000000  Diastolic BP 84 96 90 88 82 79 84  Wt. (Lbs) 201 201.4 204.12 201.12 202 199.8 196  BMI 36.75 36.83 37.32 36.78 36.94 36.53 35.84   F/u in 6 weeks     Neck pain Increased neck pain and headache, X ray of c spine  Headache Change in headache frequency in past several weeks. Head CT scan due to neurologic abnormalities associated with headache, and location of headache Start baclofen daily for headache prophylaxis, Palpable left trapezius spasm also present

## 2016-02-11 ENCOUNTER — Other Ambulatory Visit: Payer: Self-pay | Admitting: Family Medicine

## 2016-02-11 ENCOUNTER — Telehealth: Payer: Self-pay | Admitting: Family Medicine

## 2016-02-11 MED FILL — FLUTICASONE PROP 50 MCG SPR: 50 | 90 days supply | Qty: 48 | Fill #0

## 2016-02-11 NOTE — Telephone Encounter (Signed)
Pt called backr to referral staff the day of her visit for new disabling headaches accompanied by neurologic deficits refusing neurology eval at this time, stated that her brain scan was normal, so she opts to work on her blood pressure. During the visit , she had expressed concerns re cost of neuro care

## 2016-02-12 NOTE — Progress Notes (Signed)
This encounter was created in error - please disregard.

## 2016-02-15 ENCOUNTER — Telehealth: Payer: Self-pay

## 2016-02-15 NOTE — Telephone Encounter (Signed)
Needs to go to eD with symptoms. When she goes there , have Doc there evaluate her for TMJ, if that is an active diagnosis I will see this in the ED note and will refer her to ENT for that

## 2016-02-16 ENCOUNTER — Ambulatory Visit (INDEPENDENT_AMBULATORY_CARE_PROVIDER_SITE_OTHER): Payer: 59 | Admitting: Family Medicine

## 2016-02-16 ENCOUNTER — Encounter: Payer: Self-pay | Admitting: Family Medicine

## 2016-02-16 VITALS — BP 126/84 | HR 90 | Resp 18 | Ht 62.0 in | Wt 201.0 lb

## 2016-02-16 DIAGNOSIS — G4452 New daily persistent headache (NDPH): Secondary | ICD-10-CM

## 2016-02-16 DIAGNOSIS — I10 Essential (primary) hypertension: Secondary | ICD-10-CM | POA: Diagnosis not present

## 2016-02-16 DIAGNOSIS — M26629 Arthralgia of temporomandibular joint, unspecified side: Secondary | ICD-10-CM

## 2016-02-16 MED FILL — IBUPROFEN 800 MG TABLET: 800 | 30 days supply | Qty: 30 | Fill #1

## 2016-02-16 NOTE — Telephone Encounter (Signed)
Yes, refer to neurologist of her choice

## 2016-02-16 NOTE — Assessment & Plan Note (Addendum)
Ongoing headache, needs neurology input, head scan normal and no neurologic deficvits, refer [facility] After visit, decision made to request MRA/MR brain due ongoing headache, and imaging study set uos and pt aware. Also ESR ordered, to r/o temporal arteritis and is normal

## 2016-02-16 NOTE — Telephone Encounter (Signed)
MD blood pressure visit today in office or tomorrow am

## 2016-02-16 NOTE — Telephone Encounter (Signed)
Patient will come in for ov this afternoon

## 2016-02-16 NOTE — Telephone Encounter (Signed)
Spoke with patient. She did not go to ED.  Will check blood pressure and call back with reading today.  [facility] of [facility] over $1000.  Do you agree with a referral to neurology?

## 2016-02-16 NOTE — Assessment & Plan Note (Signed)
Mildly symptomatic, cool compress and tylenol or ibuprofen

## 2016-02-16 NOTE — Assessment & Plan Note (Signed)
Controlled, no change in medication DASH diet and commitment to daily physical activity for a minimum of 30 minutes discussed and encouraged, as a part of hypertension management. The importance of attaining a healthy weight is also discussed.  BP/Weight 02/16/2016 02/10/2016 01/03/2016 05/17/2015 04/26/2015 01/25/2015 123XX123  Systolic BP 123XX123 123456 0000000 Q000111Q XX123456 Q000111Q 0000000  Diastolic BP 84 96 90 88 82 79 84  Wt. (Lbs) 201 201.4 204.12 201.12 202 199.8 196  BMI 36.75 36.83 37.32 36.78 36.94 36.53 35.84

## 2016-02-16 NOTE — Patient Instructions (Addendum)
F/u as before, call if you need me sooner  Blood pressure is excellent , stay on same medications  You are not having a tMJ flare, cool compresses and tylenol will help pain  NEED neurology evaluation and management for headaches , you have been referred as you requested to alternate office in [facility] you feel better soon, BLOOD PRESSURE IS CONTROLLED

## 2016-02-16 NOTE — Telephone Encounter (Signed)
Called and left message for patient to return call.  

## 2016-02-17 ENCOUNTER — Telehealth: Payer: Self-pay

## 2016-02-17 DIAGNOSIS — G44001 Cluster headache syndrome, unspecified, intractable: Secondary | ICD-10-CM

## 2016-02-17 NOTE — Telephone Encounter (Signed)
Patient will have lab done this afternoon.  She does not need medication prior to scan.

## 2016-02-17 NOTE — Progress Notes (Signed)
   Subjective:    Patient ID: [name] [name], female    DOB: [birth year], 55 y.o.   MRN: [id number]  HPI  Pt back with c/o uncontrolled blood pressure and headache. States BP checked on the job was high. Requests blood test for headache, pointing to her temples and back of head. Awaiting neuro eval, has chosen to go to new Provider due to unpaid balance Denies any furhter episodes of specific sensory deficits , however headache persists  Review of Systems See HPI Denies recent fever or chills. Denies sinus pressure, nasal congestion, ear pain or sore throat. Denies chest congestion, productive cough or wheezing. Denies chest pains, palpitations and leg swelling Denies abdominal pain, nausea, vomiting,diarrhea or constipation.   Denies dysuria, frequency, hesitancy or incontinence. Denies joint pain, swelling and limitation in mobility. . Denies skin break down or rash.        Objective:   Physical Exam BP 126/84 mmHg  Pulse 90  Resp 18  Ht 5' 2" (1.575 m)  Wt 201 lb (91.173 kg)  BMI 36.75 kg/m2  SpO2 95% Patient alert and oriented and in no cardiopulmonary distress.  HEENT: No facial asymmetry, EOMI,   oropharynx pink and moist.  Neck supple no JVD, no mass. NO tenderness over both TMJ, able to fully open her  mouth Chest: Clear to auscultation bilaterally.  CVS: S1, S2 no murmurs, no S3.Regular rate.  ABD: Soft non tender.   Ext: No edema  MS: Adequate ROM spine, shoulders, hips and knees.  Skin: Intact, no ulcerations or rash noted.  Psych: Good eye contact, normal affect. Memory intact not anxious or depressed appearing.  CNS: CN 2-12 intact, power,  normal throughout.no focal deficits noted.        Assessment & Plan:  Essential hypertension Controlled, no change in medication DASH diet and commitment to daily physical activity for a minimum of 30 minutes discussed and encouraged, as a part of hypertension management. The importance of attaining a  healthy weight is also discussed.  BP/Weight 02/16/2016 02/10/2016 01/03/2016 05/17/2015 04/26/2015 01/25/2015 10/29/5595  Systolic BP 416 384 536 468 032 122 482  Diastolic BP 84 96 90 88 82 79 84  Wt. (Lbs) 201 201.4 204.12 201.12 202 199.8 196  BMI 36.75 36.83 37.32 36.78 36.94 36.53 35.84        TMJ syndrome Mildly symptomatic, cool compress and tylenol or ibuprofen  New daily persistent headache (ndph) Ongoing headache, needs neurology input, head scan normal and no neurologic deficvits, refer [facility] After visit, decision made to request MRA/MR brain due ongoing headache, and imaging study set uos and pt aware. Also ESR ordered, to r/o temporal arteritis and is normal

## 2016-02-17 NOTE — Telephone Encounter (Signed)
-----  Message from Fayrene Helper, MD sent at 02/17/2016  8:10 AM EDT ----- Regarding: pls order ESR, for headache, needs today, also, let her kknow I am ordering  Another imaging study of her brain since headache persisting. (MRI/mRA) ask if she needs somerthing to calm her for the teest and let me knwo , I will print,then you send the ativan

## 2016-02-18 ENCOUNTER — Other Ambulatory Visit: Payer: Self-pay | Admitting: Family Medicine

## 2016-02-18 ENCOUNTER — Telehealth: Payer: Self-pay | Admitting: Family Medicine

## 2016-02-18 DIAGNOSIS — G4489 Other headache syndrome: Secondary | ICD-10-CM

## 2016-02-18 LAB — SEDIMENTATION RATE: SED RATE: 17 mm/h (ref 0–30)

## 2016-02-18 NOTE — Telephone Encounter (Signed)
pls let her know result is normal , so lab test shows NO reason for her headache (ESR)

## 2016-02-18 NOTE — Telephone Encounter (Signed)
Patient is asking for Lab Results, please advise?

## 2016-02-18 NOTE — Telephone Encounter (Signed)
Pt aware.

## 2016-02-20 NOTE — Assessment & Plan Note (Signed)
Uncontrolled, add amlodipine daily DASH diet and commitment to daily physical activity for a minimum of 30 minutes discussed and encouraged, as a part of hypertension management. The importance of attaining a healthy weight is also discussed.  BP/Weight 02/16/2016 02/10/2016 01/03/2016 05/17/2015 04/26/2015 01/25/2015 123XX123  Systolic BP 123XX123 123456 0000000 Q000111Q XX123456 Q000111Q 0000000  Diastolic BP 84 96 90 88 82 79 84  Wt. (Lbs) 201 201.4 204.12 201.12 202 199.8 196  BMI 36.75 36.83 37.32 36.78 36.94 36.53 35.84   F/u in 6 weeks

## 2016-02-20 NOTE — Assessment & Plan Note (Signed)
Change in headache frequency in past several weeks. Head CT scan due to neurologic abnormalities associated with headache, and location of headache Start baclofen daily for headache prophylaxis, Palpable left trapezius spasm also present

## 2016-02-20 NOTE — Assessment & Plan Note (Signed)
Increased neck pain and headache, X ray of c spine

## 2016-02-23 ENCOUNTER — Ambulatory Visit (HOSPITAL_COMMUNITY)
Admission: RE | Admit: 2016-02-23 | Discharge: 2016-02-23 | Disposition: A | Discharge status: Home / Self Care | Payer: 59 | Source: Ambulatory Visit | Attending: Family Medicine | Admitting: Family Medicine

## 2016-02-23 DIAGNOSIS — R51 Headache: Secondary | ICD-10-CM | POA: Diagnosis not present

## 2016-02-23 DIAGNOSIS — G4489 Other headache syndrome: Secondary | ICD-10-CM

## 2016-02-23 DIAGNOSIS — G4452 New daily persistent headache (NDPH): Secondary | ICD-10-CM | POA: Insufficient documentation

## 2016-02-23 DIAGNOSIS — R930 Abnormal findings on diagnostic imaging of skull and head, not elsewhere classified: Secondary | ICD-10-CM | POA: Diagnosis not present

## 2016-02-28 ENCOUNTER — Encounter (HOSPITAL_COMMUNITY): Payer: 59

## 2016-02-28 ENCOUNTER — Encounter (HOSPITAL_COMMUNITY): Payer: Self-pay | Admitting: Hematology & Oncology

## 2016-02-28 ENCOUNTER — Encounter (HOSPITAL_COMMUNITY): Payer: 59 | Attending: Hematology & Oncology | Admitting: Hematology & Oncology

## 2016-02-28 VITALS — BP 126/77 | HR 83 | Temp 98.6°F | Resp 18 | Wt 202.0 lb

## 2016-02-28 DIAGNOSIS — Z08 Encounter for follow-up examination after completed treatment for malignant neoplasm: Secondary | ICD-10-CM | POA: Diagnosis not present

## 2016-02-28 DIAGNOSIS — Z7984 Long term (current) use of oral hypoglycemic drugs: Secondary | ICD-10-CM | POA: Insufficient documentation

## 2016-02-28 DIAGNOSIS — I10 Essential (primary) hypertension: Secondary | ICD-10-CM | POA: Diagnosis not present

## 2016-02-28 DIAGNOSIS — E785 Hyperlipidemia, unspecified: Secondary | ICD-10-CM | POA: Diagnosis not present

## 2016-02-28 DIAGNOSIS — Z720 Tobacco use: Secondary | ICD-10-CM | POA: Diagnosis not present

## 2016-02-28 DIAGNOSIS — R21 Rash and other nonspecific skin eruption: Secondary | ICD-10-CM

## 2016-02-28 DIAGNOSIS — Z9851 Tubal ligation status: Secondary | ICD-10-CM | POA: Insufficient documentation

## 2016-02-28 DIAGNOSIS — Z79899 Other long term (current) drug therapy: Secondary | ICD-10-CM | POA: Insufficient documentation

## 2016-02-28 DIAGNOSIS — Z9049 Acquired absence of other specified parts of digestive tract: Secondary | ICD-10-CM | POA: Insufficient documentation

## 2016-02-28 DIAGNOSIS — Z9221 Personal history of antineoplastic chemotherapy: Secondary | ICD-10-CM | POA: Diagnosis not present

## 2016-02-28 DIAGNOSIS — Z833 Family history of diabetes mellitus: Secondary | ICD-10-CM | POA: Insufficient documentation

## 2016-02-28 DIAGNOSIS — F17210 Nicotine dependence, cigarettes, uncomplicated: Secondary | ICD-10-CM | POA: Insufficient documentation

## 2016-02-28 DIAGNOSIS — Z9889 Other specified postprocedural states: Secondary | ICD-10-CM | POA: Insufficient documentation

## 2016-02-28 DIAGNOSIS — Z853 Personal history of malignant neoplasm of breast: Secondary | ICD-10-CM | POA: Diagnosis not present

## 2016-02-28 DIAGNOSIS — C50919 Malignant neoplasm of unspecified site of unspecified female breast: Secondary | ICD-10-CM

## 2016-02-28 DIAGNOSIS — C50912 Malignant neoplasm of unspecified site of left female breast: Secondary | ICD-10-CM

## 2016-02-28 LAB — COMPREHENSIVE METABOLIC PANEL
ALBUMIN: 4.1 g/dL (ref 3.5–5.0)
ALK PHOS: 59 U/L (ref 38–126)
ALT: 21 U/L (ref 14–54)
AST: 20 U/L (ref 15–41)
Anion gap: 7 (ref 5–15)
BILIRUBIN TOTAL: 0.4 mg/dL (ref 0.3–1.2)
BUN: 9 mg/dL (ref 6–20)
CALCIUM: 9.3 mg/dL (ref 8.9–10.3)
CO2: 28 mmol/L (ref 22–32)
Chloride: 104 mmol/L (ref 101–111)
Creatinine, Ser: 0.6 mg/dL (ref 0.44–1.00)
GFR calc non Af Amer: >60 mL/min (ref >60)
Glucose, Bld: 93 mg/dL (ref 65–99)
POTASSIUM: 3.4 mmol/L — AB (ref 3.5–5.1)
SODIUM: 139 mmol/L (ref 135–145)
TOTAL PROTEIN: 7.4 g/dL (ref 6.5–8.1)

## 2016-02-28 LAB — CBC WITH DIFFERENTIAL/PLATELET
BASOS ABS: 0 10*3/uL (ref 0.0–0.1)
BASOS PCT: 0 %
EOS ABS: 0.2 10*3/uL (ref 0.0–0.7)
Eosinophils Relative: 3 %
HEMATOCRIT: 39.5 % (ref 36.0–46.0)
HEMOGLOBIN: 12.5 g/dL (ref 12.0–15.0)
Lymphocytes Relative: 38 %
Lymphs Abs: 2.5 10*3/uL (ref 0.7–4.0)
MCH: 29.3 pg (ref 26.0–34.0)
MCHC: 31.6 g/dL (ref 30.0–36.0)
MCV: 92.7 fL (ref 78.0–100.0)
Monocytes Absolute: 0.4 10*3/uL (ref 0.1–1.0)
Monocytes Relative: 6 %
NEUTROS ABS: 3.4 10*3/uL (ref 1.7–7.7)
NEUTROS PCT: 53 %
Platelets: 399 10*3/uL (ref 150–400)
RBC: 4.26 MIL/uL (ref 3.87–5.11)
RDW: 14.2 % (ref 11.5–15.5)
WBC: 6.5 10*3/uL (ref 4.0–10.5)

## 2016-02-28 NOTE — Patient Instructions (Signed)
[place] at [facility] Discharge Instructions  RECOMMENDATIONS MADE BY THE CONSULTANT AND ANY TEST RESULTS WILL BE SENT TO YOUR REFERRING PHYSICIAN.  Return in 1 year for follow up with lab work.   Thank you for choosing [place] at [facility] to provide your oncology and hematology care.  To afford each patient quality time with our provider, please arrive at least 15 minutes before your scheduled appointment time.   Beginning January 23rd 2017 lab work for the [facility] will be done in the  Main lab at [facility] on 1st floor. If you have a lab appointment with the [facility] please come in thru the  Main Entrance and check in at the main information desk  You need to re-schedule your appointment should you arrive 10 or more minutes late.  We strive to give you quality time with our providers, and arriving late affects you and other patients whose appointments are after yours.  Also, if you no show three or more times for appointments you may be dismissed from the clinic at the providers discretion.     Again, thank you for choosing [facility].  Our hope is that these requests will decrease the amount of time that you wait before being seen by our physicians.       _____________________________________________________________  Should you have questions after your visit to [facility], please contact our office at [phone] between the hours of 8:30 a.m. and 4:30 p.m.  Voicemails left after 4:30 p.m. will not be returned until the following business day.  For prescription refill requests, have your pharmacy contact our office.         Resources For Cancer Patients and their Caregivers ? American Cancer Society: Can assist with transportation, wigs, general needs, runs Look Good Feel Better.        510-102-4853 ? Cancer Care: Provides financial assistance, online support groups,  medication/co-pay assistance.  1-800-813-HOPE 2490901071) ? [facility] Assists [place] cancer patients and their families through emotional , educational and financial support.  989-758-0514 ? [place] DSS Where to apply for food stamps, Medicaid and utility assistance. 832-363-6013 ? [facility]: Transportation to medical appointments. 805-021-0697 ? Social Security Administration: May apply for disability if have a Stage IV cancer. 514-747-9914 331-810-6068 ? [employer], Disability and Transit Services: Assists with nutrition, care and transit needs. [facility] Support Programs: @10RELATIVEDAYS @ > Cancer Support Group  2nd Tuesday of the month 1pm-2pm, Journey Room  > Creative Journey  3rd Tuesday of the month 1130am-1pm, Journey Room  > Look Good Feel Better  1st Wednesday of the month 10am-12 noon, Journey Room (Call [facility] to register [phone])

## 2016-02-28 NOTE — Progress Notes (Signed)
[facility] NOTE  Patient Care Team: Fayrene Helper, MD as PCP - General  DIAGNOSIS: Malignant neoplasm of breast (female), unspecified site  Stage II left breast cancer, status post lumpectomy, axillary lymph node dissection, chemotherapy with Adriamycin and Cytoxan for 4 cycles followed by now will be in Taxotere for 4 cycles given in a dose dense fashion followed by one year of trastuzumab. Surgery was performed on 10/18/2004.    HISTORY OF PRESENTING ILLNESS:  [name] [name] 55 y.o. female is here because of breast cancer originally diagnosed in 2006. She returns for yearly follow-up.  She started having headaches on 10/26/14. She was ultimately diagnosed with migraine headaches. She has just had an MRI/MRA and notes she has been referred to neurology by Dr. Moshe Cipro.  Scheduled for mammogram in August. She is up to date on other well care.  She's eating and sleeping well. Has no complaints.  He denies any significant change in her appetite or energy level. She denies any new lumps or bumps.  She has a rash on the lateral L breast under the axillary region that comes and goes. She notes that Dr. Moshe Cipro has reviewed it as well.     MEDICAL HISTORY:  Past Medical History  Diagnosis Date  . Hyperlipidemia   . Obesity   . Heel spur   . Hypertension   . Adenocarcinoma of breast ([place])     left   . Migraines     SURGICAL HISTORY: Past Surgical History  Procedure Laterality Date  . Btl  1992  . Cholecystectomy    . Left lumpectomy and lymph node disection  2000  . R heel surgery for spur and chipped bone  11/09/20099  . Tubal ligation    . Colonoscopy  09/15/2011    Procedure: COLONOSCOPY;  Surgeon: Dorothyann Peng, MD;  [facility];  Service: Endoscopy;  Laterality: N/A;  9:15 AM    SOCIAL HISTORY: Social History   Social History  . Marital Status: Married    Spouse Name: N/A  . Number of Children: N/A  . Years  of Education: N/A   Occupational History  . Not on file.   Social History Main Topics  . Smoking status: Current Every Day Smoker -- 0.25 packs/day for 15 years    Types: Cigarettes  . Smokeless tobacco: Not on file     Comment: smoking 3 per day  . Alcohol Use: No  . Drug Use: No     Comment: pt states "3 cigarrettes per day"  . Sexual Activity: Yes    Birth Control/ Protection: Post-menopausal   Other Topics Concern  . Not on file   Social History Narrative    FAMILY HISTORY: Family History  Problem Relation Age of Onset  . Heart attack Mother   . Heart attack Father   . Glaucoma Father   . Hypertension Father   . Hypertension Sister   . Diabetes Sister   . Anesthesia problems Neg Hx   . Hypotension Neg Hx   . Malignant hyperthermia Neg Hx   . Pseudochol deficiency Neg Hx    indicated that her mother is deceased. She indicated that her father is alive. She indicated that both of her sisters are alive. She indicated that her brother is alive.   She has 3 children and 1 grandchild; 26 y/o Her children are 64 and 56 year old 78  No one else in her family had  breast cancer.   ALLERGIES:  is allergic to ace inhibitors.  MEDICATIONS:  Current Outpatient Prescriptions  Medication Sig Dispense Refill  . amLODipine (NORVASC) 2.5 MG tablet Take 1 tablet (2.5 mg total) by mouth daily. 90 tablet 3  . baclofen (LIORESAL) 10 MG tablet Take 1 tablet (10 mg total) by mouth 2 (two) times daily. 30 each 0  . Biotin 1000 MCG tablet Take 1,000 mcg by mouth 3 (three) times daily.    . fluticasone (FLONASE) 50 MCG/ACT nasal spray USE 2 SPRAYS IN EACH NOSTRIL ONCE DAILY 48 g 1  . hydrochlorothiazide (HYDRODIURIL) 25 MG tablet TAKE 1 TABLET BY MOUTH DAILY 90 tablet PRN  . ibuprofen (ADVIL,MOTRIN) 800 MG tablet TAKE 1 TABLET BY MOUTH ONCE DAILY AS NEEDED 30 tablet PRN  . Loratadine 10 MG CAPS Take 1 capsule (10 mg total) by mouth daily. 90 each 1  . potassium chloride SA  (K-DUR,KLOR-CON) 20 MEQ tablet TAKE 1 TABLET BY MOUTH ONCE DAILY 90 tablet PRN  . metFORMIN (GLUCOPHAGE) 500 MG tablet Take 1 tablet (500 mg total) by mouth daily with breakfast. (Patient not taking: Reported on 02/28/2016) 90 tablet 1   No current facility-administered medications for this visit.    Review of Systems  Constitutional: Negative for fever, chills, weight loss and malaise/fatigue.  HENT: Negative for congestion, hearing loss, nosebleeds, sore throat and tinnitus.   Eyes: Negative for blurred vision, double vision, pain and discharge.  Respiratory: Negative for cough, hemoptysis, sputum production, shortness of breath and wheezing.   Cardiovascular: Negative for chest pain, palpitations, claudication, leg swelling and PND.  Gastrointestinal: Negative for heartburn, nausea, vomiting, abdominal pain, diarrhea, constipation, blood in stool and melena.  Genitourinary: Negative for dysuria, urgency, frequency and hematuria.  Musculoskeletal: Negative for myalgias, joint pain and falls.  Skin: Negative for itching and rash.       Flesh colored rash on upper lateral L breast "comes and goes" present for several years  Neurological: Negative for dizziness, tingling, tremors, sensory change, speech change, focal weakness, seizures, loss of consciousness, weakness and headaches.  Endo/Heme/Allergies: Does not bruise/bleed easily.  Psychiatric/Behavioral: Negative for depression, suicidal ideas, memory loss and substance abuse. The patient is not nervous/anxious and does not have insomnia.    14 point ROS was done and is otherwise as detailed above or in HPI   PHYSICAL EXAMINATION: ECOG PERFORMANCE STATUS: 0 - Asymptomatic  Filed Vitals:   02/28/16 0954  BP: 126/77  Pulse: 83  Temp: 98.6 F (37 C)  Resp: 18   Filed Weights   02/28/16 0954  Weight: 202 lb (91.627 kg)     Physical Exam  Constitutional: She is oriented to person, place, and time and well-developed,  well-nourished, and in no distress.  HENT:  Head: Normocephalic and atraumatic.  Nose: Nose normal.  Mouth/Throat: Oropharynx is clear and moist. No oropharyngeal exudate.  Eyes: Conjunctivae and EOM are normal. Pupils are equal, round, and reactive to light. Right eye exhibits no discharge. Left eye exhibits no discharge. No scleral icterus.  Neck: Normal range of motion. Neck supple. No tracheal deviation present. No thyromegaly present.  Cardiovascular: Normal rate, regular rhythm and normal heart sounds.  Exam reveals no gallop and no friction rub.   No murmur heard. Pulmonary/Chest: Effort normal and breath sounds normal. She has no wheezes. She has no rales.  Abdominal: Soft. Bowel sounds are normal. She exhibits no distension and no mass. There is no tenderness. There is no rebound and no guarding.  Musculoskeletal: Normal range of motion. She exhibits no edema.  Lymphadenopathy:    She has no cervical adenopathy.  Neurological: She is alert and oriented to person, place, and time. She has normal reflexes. No cranial nerve deficit. Gait normal. Coordination normal.  Skin: Skin is warm and dry. No rash noted.  Psychiatric: Mood, memory, affect and judgment normal.  Nursing note and vitals reviewed.  BREAST: Radiation changes to left breast. Flesh colored, raised papular rash under axillary region and lateral breast in area of friction, no erythema Otherwise no nodules. No other palpable abnormality Nipples normal and healthy. No palpable lymph nodes in bilateral axilla. R breast exam unremarkable   LABORATORY DATA:  I have reviewed the data as listed Lab Results  Component Value Date   WBC 6.5 02/28/2016   HGB 12.5 02/28/2016   HCT 39.5 02/28/2016   MCV 92.7 02/28/2016   PLT 399 02/28/2016   CMP     Component Value Date/Time   NA 139 02/28/2016 0929   K 3.4* 02/28/2016 0929   CL 104 02/28/2016 0929   CO2 28 02/28/2016 0929   GLUCOSE 93 02/28/2016 0929   BUN 9 02/28/2016  0929   CREATININE 0.60 02/28/2016 0929   CREATININE 0.63 12/08/2015 0729   CALCIUM 9.3 02/28/2016 0929   PROT 7.4 02/28/2016 0929   ALBUMIN 4.1 02/28/2016 0929   AST 20 02/28/2016 0929   ALT 21 02/28/2016 0929   ALKPHOS 59 02/28/2016 0929   BILITOT 0.4 02/28/2016 0929   GFRNONAA >60 02/28/2016 0929   GFRNONAA >89 04/08/2012 0733   GFRAA >60 02/28/2016 0929   GFRAA >89 04/08/2012 0733     RADIOGRAPHIC STUDIES: I have personally reviewed the radiological images as listed and agreed with the findings in the report. CLINICAL DATA: Screening. History of left lumpectomy with chemotherapy and radiation treatment in 2006 and 2007.  EXAM: DIGITAL SCREENING BILATERAL MAMMOGRAM WITH CAD  COMPARISON: Previous exam(s).  ACR Breast Density Category b: There are scattered areas of fibroglandular density.  FINDINGS: There are no findings suspicious for malignancy. Post operative changes are seen in the leftbreast. Images were processed with CAD.  IMPRESSION: No mammographic evidence of malignancy. A result letter of this screening mammogram will be mailed directly to the patient.  RECOMMENDATION: Screening mammogram in one year. (Code:SM-B-01Y)  BI-RADS CATEGORY 1: Negative.   Electronically Signed  By: Shon Hale M.D.  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 I also reviewed her physical examination advised her that her breast exam is unremarkable. In regards to her 'rash' I offered a dermatology referral and she will think on this. I do not feel it is malignant.   She will be due for an upcoming screening mammogram in August. She is complaint. We will continue with yearly follow-up. If she has any interim problems or concerns she was advised to let us know.  Smoking cessation was addressed with the patient. We  offered her methods to quit including referral to smoking cessation class. She is currently unwilling to discontinue. Dr. Moshe Cipro works on this issue with her as well.    Orders Placed This Encounter  Procedures  . CBC with Differential    Standing Status: Future     Number of Occurrences:      Standing Expiration Date: 02/27/2018  . Comprehensive metabolic panel  Standing Status: Future     Number of Occurrences:      Standing Expiration Date: 02/27/2018    All questions were answered. The patient knows to call the clinic with any problems, questions or concerns.  This note was electronically signed.    This document serves as a record of services personally performed by Ancil Linsey, MD. It was created on her behalf by Pearlie Oyster, a trained medical scribe. The creation of this record is based on the scribe's personal observations and the provider's statements to them. This document has been checked and approved by the attending provider.     Kelby Fam. Grayce Budden MD

## 2016-03-06 ENCOUNTER — Encounter: Payer: Self-pay | Admitting: Neurology

## 2016-03-06 ENCOUNTER — Telehealth: Payer: Self-pay | Admitting: Neurology

## 2016-03-06 ENCOUNTER — Ambulatory Visit (INDEPENDENT_AMBULATORY_CARE_PROVIDER_SITE_OTHER): Payer: 59 | Admitting: Neurology

## 2016-03-06 VITALS — BP 138/90 | HR 82 | Resp 18 | Ht 62.0 in | Wt 202.2 lb

## 2016-03-06 DIAGNOSIS — R519 Headache, unspecified: Secondary | ICD-10-CM

## 2016-03-06 DIAGNOSIS — R51 Headache: Secondary | ICD-10-CM

## 2016-03-06 DIAGNOSIS — J328 Other chronic sinusitis: Secondary | ICD-10-CM | POA: Diagnosis not present

## 2016-03-06 NOTE — Patient Instructions (Signed)
Remember to drink plenty of fluid, eat healthy meals and do not skip any meals. Try to eat protein with a every meal and eat a healthy snack such as fruit or nuts in between meals. Try to keep a regular sleep-wake schedule and try to exercise daily, particularly in the form of walking, 20-30 minutes a day, if you can.   As far as diagnostic testing: Labwork today  I would like to see you back after being treated by Dr. Ernesto Rutherford, sooner if we need to. Please call us with any interim questions, concerns, problems, updates or refill requests.   Our phone number is [phone]. We also have an after hours call service for urgent matters and there is a physician on-call for urgent questions. For any emergencies you know to call 911 or go to the nearest emergency room

## 2016-03-06 NOTE — Progress Notes (Addendum)
[facility]    Provider:  Dr Jaynee Eagles Referring Provider: Fayrene Helper, MD Primary Care Physician:  Tula Nakayama, MD  CC:  headaches  HPI:  [name] [name] is a 55 y.o. female here as a referral from Dr. Moshe Cipro for migraines. She has a past medical history of hypertension, migraines, malignant neoplasm of breast status post lumpectomy and axillary lymph node dissection and chemotherapy. [facility] the [facility] in the past. She has had headaches for 5-7 years or longer. Now she gets pain in different spots, temples, in the frontal area, in the back of the head and neck. The headaches feel like throbbing. She has some nausea. But no light sensitivity, no sound sensitivity. She wakes up with headaches every day, she is congested chronically, She has congestion, she has not been to the ENT. The most recent headaches are not like the past migraines, these headaches are worsening in the last month in the setting of lots of sinus congestion, she believes the headaches are sinus related. Can range from 4-10 in severity. Slight relief and ibuprofen. She has jaw pain. She does not have fevers. She has blurry vision with and without headaches. Headaches are every day, they last 15-20 minutes, she always wakes up with a headache. She is very tired during the day. She has some "out it" with the headaches which does not last. She has tried prednisone and it has not helped. She is here with daughter who provides information as well. No weakness, no sensory changes, no dysarthria, no dysphasia, no aphasia, no meningeal signs, no changes in gait. No other focal neurologic deficits or complaints. She has sinus tenderness on palpation.  Reviewed notes, labs and imaging from outside physicians, which showed:   MRI of the brain 02/23/2016, personally reviewed and agree with the following: 1. No acute intracranial abnormality. 2. Stable noncontrast MRI appearance of the  brain since 2016 with chronically advanced but nonspecific cerebral white matter signal changes.  Reviewed notes from Dr. Moshe Cipro. Patient with history of elevated blood pressure and headache. In April she reported 3 weeks history of uncontrolled allergy symptoms,. Orbital pain, excess tearing and nasal congestion.  In May 18 she complained of one month of daily headaches ranging from 4-10 and pain generally bitemporal and throbbing. Headaches associated with blurry vision and difficulty with speech or approximately 15 minutes also nausea. She had episodes of lightheadedness with severe headache but complained of the back of the head duration approximately 15 minutes, sweating and nausea, weight spots, BP was checked on the job and was high. Amlodipine was added. Head CT was ordered. Palpable left trapezius spasm is also present. MRI and MRA of the head were also ordered. ESR was checked to rule out temporal arteritis and was normal. She complained of headache again at office appointment in May 25th. Refer to a temples and back of the head. No other specific sensory deficits. At that time she denied any chest congestion or sinus pressure. Exam was unremarkable.  ESR 02/17/2016 17, cbc and cmp unremarkable  Review of Systems: Patient complains of symptoms per HPI as well as the following symptoms: Blurred vision, headache, slurred speech, decreased energy. Pertinent negatives per HPI. All others negative.   Social History   Social History  . Marital Status: Married    Spouse Name: N/A  . Number of Children: N/A  . Years of Education: N/A   Occupational History  . Not on file.   Social History  Main Topics  . Smoking status: Current Every Day Smoker -- 0.25 packs/day for 15 years    Types: Cigarettes  . Smokeless tobacco: Not on file     Comment: smoking 3 per day  . Alcohol Use: No  . Drug Use: No     Comment: pt states "3 cigarrettes per day"  . Sexual Activity: Yes    Birth Control/  Protection: Post-menopausal   Other Topics Concern  . Not on file   Social History Narrative    Family History  Problem Relation Age of Onset  . Heart attack Mother   . Heart attack Father   . Glaucoma Father   . Hypertension Father   . Hypertension Sister   . Diabetes Sister   . Anesthesia problems Neg Hx   . Hypotension Neg Hx   . Malignant hyperthermia Neg Hx   . Pseudochol deficiency Neg Hx   . Migraines Neg Hx     Past Medical History  Diagnosis Date  . Hyperlipidemia   . Obesity   . Heel spur   . Hypertension   . Adenocarcinoma of breast ([place])     left   . Migraines     Past Surgical History  Procedure Laterality Date  . Btl  1992  . Cholecystectomy    . Left lumpectomy and lymph node disection  2000  . R heel surgery for spur and chipped bone  11/09/20099  . Tubal ligation    . Colonoscopy  09/15/2011    Procedure: COLONOSCOPY;  Surgeon: Dorothyann Peng, MD;  [facility];  Service: Endoscopy;  Laterality: N/A;  9:15 AM    Current Outpatient Prescriptions  Medication Sig Dispense Refill  . amLODipine (NORVASC) 2.5 MG tablet Take 1 tablet (2.5 mg total) by mouth daily. 90 tablet 3  . baclofen (LIORESAL) 10 MG tablet Take 1 tablet (10 mg total) by mouth 2 (two) times daily. 30 each 0  . Biotin 1000 MCG tablet Take 1,000 mcg by mouth 3 (three) times daily.    . fluticasone (FLONASE) 50 MCG/ACT nasal spray USE 2 SPRAYS IN EACH NOSTRIL ONCE DAILY 48 g 1  . hydrochlorothiazide (HYDRODIURIL) 25 MG tablet TAKE 1 TABLET BY MOUTH DAILY 90 tablet PRN  . ibuprofen (ADVIL,MOTRIN) 800 MG tablet TAKE 1 TABLET BY MOUTH ONCE DAILY AS NEEDED 30 tablet PRN  . Loratadine 10 MG CAPS Take 1 capsule (10 mg total) by mouth daily. 90 each 1  . potassium chloride SA (K-DUR,KLOR-CON) 20 MEQ tablet TAKE 1 TABLET BY MOUTH ONCE DAILY 90 tablet PRN  . metFORMIN (GLUCOPHAGE) 500 MG tablet Take 1 tablet (500 mg total) by mouth daily with breakfast. (Patient not taking: Reported  on 02/28/2016) 90 tablet 1   No current facility-administered medications for this visit.    Allergies as of 03/06/2016 - Review Complete 03/06/2016  Allergen Reaction Noted  . Ace inhibitors Cough     Vitals: BP 138/90 mmHg  Pulse 82  Resp 18  Ht '5\' 2"'$  (1.575 m)  Wt 202 lb 3.2 oz (91.717 kg)  BMI 36.97 kg/m2 Last Weight:  Wt Readings from Last 1 Encounters:  03/06/16 202 lb 3.2 oz (91.717 kg)   Last Height:   Ht Readings from Last 1 Encounters:  03/06/16 '5\' 2"'$  (1.575 m)   Physical exam: Exam: Gen: NAD, conversant, well nourised, obese, well groomed                     CV:  RRR, no MRG. No Carotid Bruits. No peripheral edema, warm, nontender Eyes: Conjunctivae clear without exudates or hemorrhage Nose: sinus tenderness on palpation.  Neuro: Detailed Neurologic Exam  Speech:    Speech is normal; fluent and spontaneous with normal comprehension.  Cognition:    The patient is oriented to person, place, and time;     recent and remote memory intact;     language fluent;     normal attention, concentration,     fund of knowledge Cranial Nerves:    The pupils are equal, round, and reactive to light. Attempted funduscopic exam could not visualize due to small pupils.. Visual fields are full to finger confrontation. Extraocular movements are intact. Trigeminal sensation is intact and the muscles of mastication are normal. The face is symmetric. The palate elevates in the midline. Hearing intact. Voice is normal. Shoulder shrug is normal. The tongue has normal motion without fasciculations.   Coordination:    Normal finger to nose and heel to shin. Normal rapid alternating movements.   Gait:    Heel-toe and tandem gait are normal.   Motor Observation:    No asymmetry, no atrophy, and no involuntary movements noted. Tone:    Normal muscle tone.    Posture:    Posture is normal. normal erect    Strength:    Strength is V/V in the upper and lower limbs.      Sensation:  intact to LT     Reflex Exam:  DTR's: Absent lowers, trace uppers      Toes:    The toes are downgoing bilaterally.   Clonus:    Clonus is absent.       Assessment/Plan:  55 year old patient who comes in with worsening headaches over the last month, not migrainous, in the setting of congestion and sinus pain. Unclear if this has anything do with her sinuses or if this is a headache syndrome. MRI of the brain was unchanged.   -Will refer to ENT for evaluation of chronic sinusitis, patient has pain and tenderness in the face, she thinks this is where the headaches are coming from. - Recheck ESR and adds CRP to evaluate again for temporal arteritis - Patient will follow up with me after being treated by Dr. Ernesto Rutherford and if headache persists may consider treating headaches as well as referral to sleep studies due to the morning headaches. -Sleep eval: she always wakes up with a headache. She is very tired during the day. If persists after being treated by Dr. Ernesto Rutherford consider sleep eval. - Will [facility] from [facility]. - Needs close management of vascular risk factors including hypertension  for white matter changes on the brain which are likely chronic microvascular ischemic changes.  Addendum 03/26/2016: Patient was evaluated by Dr. Ernesto Rutherford. No severe edema and drainage down the back of her throat. Lateral pharyngeal walls definitely erythematous especially on the left. She was treated with antibiotics and is following with Dr. Ernesto Rutherford.  I also reviewed records from the [facility]. There she had nerve blocks and trigger point injections. Diagnosed with migraine with aura, occipital neuralgia, supraorbital neuralgia, cervicalgia, myalgia and headache. She was started on Topamax and baclofen. He performed a series of nerve blocks.   To prevent or relieve headaches, try the following: Cool Compress. Lie down and place a cool compress on your head.    Avoid headache triggers. If certain foods or odors seem to have triggered your migraines in the past, avoid them. A  headache diary might help you identify triggers.  Include physical activity in your daily routine. Try a daily walk or other moderate aerobic exercise.  Manage stress. Find healthy ways to cope with the stressors, such as delegating tasks on your to-do list.  Practice relaxation techniques. Try deep breathing, yoga, massage and visualization.  Eat regularly. Eating regularly scheduled meals and maintaining a healthy diet might help prevent headaches. Also, drink plenty of fluids.  Follow a regular sleep schedule. Sleep deprivation might contribute to headaches Consider biofeedback. With this mind-body technique, you learn to control certain bodily functions - such as muscle tension, heart rate and blood pressure - to prevent headaches or reduce headache pain.    Proceed to emergency room if you experience new or worsening symptoms or symptoms do not resolve, if you have new neurologic symptoms or if headache is severe, or for any concerning symptom.    Sarina Ill, MD  [facility] [address] [place], [place] [address]  Phone [phone] Fax [phone]

## 2016-03-06 NOTE — Telephone Encounter (Signed)
[name] [name], I placed a referral for this patient to Dr. Ernesto Rutherford, would you be able to call their office and inquire if she can be seen next Monday? My note is complete.  [name], I did not get a signed release to get her records from the [place], can you call patient and ask for consent verbally and then I can send in the request? Also make sure she knows to follow up with me after seeing Dr. Marcelline Mates if headache persists and that I will review her records from [facility] if she agrees.  Thanks

## 2016-03-07 ENCOUNTER — Telehealth: Payer: Self-pay | Admitting: *Deleted

## 2016-03-07 LAB — SEDIMENTATION RATE: SED RATE: 7 mm/h (ref 0–40)

## 2016-03-07 LAB — C-REACTIVE PROTEIN: CRP: 8.8 mg/L — AB (ref 0.0–4.9)

## 2016-03-07 NOTE — Telephone Encounter (Signed)
I have faxed referral to Dr. Ernesto Rutherford asking for  for apt. Next Monday . I asked them to call me with apt status.

## 2016-03-07 NOTE — Telephone Encounter (Signed)
-----   Message from Melvenia Beam, MD sent at 03/07/2016  1:21 PM EDT ----- Labs unremarkable thanks

## 2016-03-07 NOTE — Telephone Encounter (Signed)
[name] [name]- are you able to call and request records from [facility]? Dr Jaynee Eagles said she did not get a signed consent form. Can we call pt?

## 2016-03-07 NOTE — Telephone Encounter (Signed)
Thank you :)

## 2016-03-07 NOTE — Telephone Encounter (Signed)
Dr Jaynee Eagles- [place] Haggard S in medical records called [facility], but they advised pt must sign release form. They are going to call pt to sign release form and will then send [place] the records.

## 2016-03-07 NOTE — Telephone Encounter (Signed)
Called pt. Advised labs unremarkable per Dr Jaynee Eagles. Pt verbalized understanding.

## 2016-03-07 NOTE — Telephone Encounter (Signed)
Patient has apt with Dr. Jodell Cipro 03/13/2016 at 9:45 This coming Monday.

## 2016-03-10 ENCOUNTER — Telehealth: Payer: Self-pay | Admitting: *Deleted

## 2016-03-10 MED FILL — IBUPROFEN 800 MG TABLET: 800 | 30 days supply | Qty: 30 | Fill #2

## 2016-03-10 NOTE — Telephone Encounter (Signed)
Pt notes from headache wellness on [facility] desk for review.

## 2016-03-11 ENCOUNTER — Encounter (HOSPITAL_COMMUNITY): Payer: Self-pay | Admitting: Emergency Medicine

## 2016-03-11 ENCOUNTER — Emergency Department (HOSPITAL_COMMUNITY)
Admission: EM | Admit: 2016-03-11 | Discharge: 2016-03-11 | Disposition: A | Discharge status: Home / Self Care | Payer: 59 | Attending: Emergency Medicine | Admitting: Emergency Medicine

## 2016-03-11 DIAGNOSIS — E669 Obesity, unspecified: Secondary | ICD-10-CM | POA: Insufficient documentation

## 2016-03-11 DIAGNOSIS — Z6836 Body mass index (BMI) 36.0-36.9, adult: Secondary | ICD-10-CM | POA: Insufficient documentation

## 2016-03-11 DIAGNOSIS — E785 Hyperlipidemia, unspecified: Secondary | ICD-10-CM | POA: Diagnosis not present

## 2016-03-11 DIAGNOSIS — Z79899 Other long term (current) drug therapy: Secondary | ICD-10-CM | POA: Insufficient documentation

## 2016-03-11 DIAGNOSIS — R300 Dysuria: Secondary | ICD-10-CM | POA: Diagnosis present

## 2016-03-11 DIAGNOSIS — N390 Urinary tract infection, site not specified: Secondary | ICD-10-CM | POA: Insufficient documentation

## 2016-03-11 DIAGNOSIS — I10 Essential (primary) hypertension: Secondary | ICD-10-CM | POA: Insufficient documentation

## 2016-03-11 LAB — URINE MICROSCOPIC-ADD ON

## 2016-03-11 LAB — URINALYSIS, ROUTINE W REFLEX MICROSCOPIC
Bilirubin Urine: NEGATIVE
Glucose, UA: NEGATIVE mg/dL
KETONES UR: NEGATIVE mg/dL
NITRITE: NEGATIVE
PH: 6.5 (ref 5.0–8.0)
Protein, ur: 100 mg/dL — AB
Specific Gravity, Urine: 1.025 (ref 1.005–1.030)

## 2016-03-11 MED ORDER — PHENAZOPYRIDINE HCL 100 MG PO TABS
100.0000 mg | ORAL_TABLET | Freq: Once | ORAL | Status: AC
  Administered 2016-03-11: 100 mg via ORAL
  Filled 2016-03-11: qty 1

## 2016-03-11 MED ORDER — PHENAZOPYRIDINE HCL 100 MG PO TABS: 100.0000 mg | ORAL_TABLET | Freq: Three times a day (TID) | ORAL | Status: DC | PRN

## 2016-03-11 MED ORDER — ONDANSETRON HCL 4 MG PO TABS
4.0000 mg | ORAL_TABLET | Freq: Once | ORAL | Status: AC
  Administered 2016-03-11: 4 mg via ORAL
  Filled 2016-03-11: qty 1

## 2016-03-11 MED ORDER — CEPHALEXIN 500 MG PO CAPS: 500.0000 mg | ORAL_CAPSULE | Freq: Four times a day (QID) | ORAL | Status: DC

## 2016-03-11 MED ORDER — CEPHALEXIN 500 MG PO CAPS
500.0000 mg | ORAL_CAPSULE | Freq: Once | ORAL | Status: AC
  Administered 2016-03-11: 500 mg via ORAL
  Filled 2016-03-11: qty 1

## 2016-03-11 NOTE — ED Provider Notes (Signed)
CSN: [id number]     Arrival date & time 03/11/16  1337 History   First MD Initiated Contact with Patient 03/11/16 1505     Chief Complaint  Patient presents with  . Dysuria     (Consider location/radiation/quality/duration/timing/severity/associated sxs/prior Treatment) Patient is a 55 y.o. female presenting with dysuria. The history is provided by the patient.  Dysuria Pain quality:  Burning Pain severity:  Moderate Onset quality:  Gradual Duration:  10 days Timing:  Intermittent Progression:  Worsening Chronicity:  New Relieved by:  Nothing Ineffective treatments:  Cranberry juice Urinary symptoms: discolored urine and frequent urination   Associated symptoms: no fever, no nausea and no vomiting   Risk factors: no hx of pyelonephritis     Past Medical History  Diagnosis Date  . Hyperlipidemia   . Obesity   . Heel spur   . Hypertension   . Adenocarcinoma of breast ([place])     left   . Migraines    Past Surgical History  Procedure Laterality Date  . Btl  1992  . Cholecystectomy    . Left lumpectomy and lymph node disection  2000  . R heel surgery for spur and chipped bone  11/09/20099  . Tubal ligation    . Colonoscopy  09/15/2011    Procedure: COLONOSCOPY;  Surgeon: Dorothyann Peng, MD;  Location: [facility];  Service: Endoscopy;  Laterality: N/A;  9:15 AM   Family History  Problem Relation Age of Onset  . Heart attack Mother   . Heart attack Father   . Glaucoma Father   . Hypertension Father   . Hypertension Sister   . Diabetes Sister   . Anesthesia problems Neg Hx   . Hypotension Neg Hx   . Malignant hyperthermia Neg Hx   . Pseudochol deficiency Neg Hx   . Migraines Neg Hx    Social History  Substance Use Topics  . Smoking status: Current Every Day Smoker -- 0.25 packs/day for 15 years    Types: Cigarettes  . Smokeless tobacco: Never Used     Comment: smoking 3 per day  . Alcohol Use: No   OB History    Gravida Para Term Preterm AB TAB SAB  Ectopic Multiple Living   2 2 2       2      Review of Systems  Constitutional: Negative for fever.  Gastrointestinal: Negative for nausea and vomiting.  Genitourinary: Positive for dysuria and frequency.  All other systems reviewed and are negative.     Allergies  Ace inhibitors  Home Medications   Prior to Admission medications   Medication Sig Start Date End Date Taking? Authorizing Provider  amLODipine (NORVASC) 2.5 MG tablet Take 1 tablet (2.5 mg total) by mouth daily. 02/10/16   Fayrene Helper, MD  baclofen (LIORESAL) 10 MG tablet Take 1 tablet (10 mg total) by mouth 2 (two) times daily. 02/10/16   Fayrene Helper, MD  Biotin 1000 MCG tablet Take 1,000 mcg by mouth 3 (three) times daily.    Historical Provider, MD  fluticasone (FLONASE) 50 MCG/ACT nasal spray USE 2 SPRAYS IN EACH NOSTRIL ONCE DAILY 02/11/16   Fayrene Helper, MD  hydrochlorothiazide (HYDRODIURIL) 25 MG tablet TAKE 1 TABLET BY MOUTH DAILY 09/24/15   Fayrene Helper, MD  ibuprofen (ADVIL,MOTRIN) 800 MG tablet TAKE 1 TABLET BY MOUTH ONCE DAILY AS NEEDED 12/23/15   Fayrene Helper, MD  Loratadine 10 MG CAPS Take 1 capsule (10 mg total) by mouth  daily. 01/03/16   Fayrene Helper, MD  metFORMIN (GLUCOPHAGE) 500 MG tablet Take 1 tablet (500 mg total) by mouth daily with breakfast. Patient not taking: Reported on 02/28/2016 01/03/16   Fayrene Helper, MD  potassium chloride SA (K-DUR,KLOR-CON) 20 MEQ tablet TAKE 1 TABLET BY MOUTH ONCE DAILY 02/03/16   Fayrene Helper, MD   BP 120/74 mmHg  Pulse 91  Temp(Src) 98.8 F (37.1 C) (Oral)  Resp 20  Ht 5\' 2"  (1.575 m)  Wt 89.812 kg  BMI 36.21 kg/m2  SpO2 99% Physical Exam  Constitutional: She is oriented to person, place, and time. She appears well-developed and well-nourished.  Non-toxic appearance.  HENT:  Head: Normocephalic.  Right Ear: Tympanic membrane and external ear normal.  Left Ear: Tympanic membrane and external ear normal.  Eyes: EOM  and lids are normal. Pupils are equal, round, and reactive to light.  Neck: Normal range of motion. Neck supple. Carotid bruit is not present.  Cardiovascular: Normal rate, regular rhythm, normal heart sounds, intact distal pulses and normal pulses.   Pulmonary/Chest: Breath sounds normal. No respiratory distress.  Abdominal: Soft. Bowel sounds are normal. There is tenderness in the suprapubic area. There is no guarding and no CVA tenderness.  Musculoskeletal: Normal range of motion.  Lymphadenopathy:       Head (right side): No submandibular adenopathy present.       Head (left side): No submandibular adenopathy present.    She has no cervical adenopathy.  Neurological: She is alert and oriented to person, place, and time. She has normal strength. No cranial nerve deficit or sensory deficit.  Skin: Skin is warm and dry.  Psychiatric: She has a normal mood and affect. Her speech is normal.  Nursing note and vitals reviewed.   ED Course  Procedures (including critical care time) Labs Review Labs Reviewed  URINALYSIS, ROUTINE W REFLEX MICROSCOPIC (NOT AT [facility]) - Abnormal; Notable for the following:    APPearance CLOUDY (*)    Hgb urine dipstick LARGE (*)    Protein, ur 100 (*)    Leukocytes, UA TRACE (*)    All other components within normal limits  URINE MICROSCOPIC-ADD ON - Abnormal; Notable for the following:    Squamous Epithelial / LPF 6-30 (*)    Bacteria, UA MANY (*)    All other components within normal limits  URINE CULTURE    Imaging Review No results found. I have personally reviewed and evaluated these images and lab results as part of my medical decision-making.   EKG Interpretation None      MDM  Vital signs are within normal limits. The examination and lab for urinary tract infection. Patient will be treated with Keflex 4 times a day. Patient will also be treated with Pyridium 3 times a day. Patient is been encouraged to increase water and juices. She is to  follow with her primary physician, or return to the department if any changes, problems, or concerns.    Final diagnoses:  None    *I have reviewed nursing notes, vital signs, and all appropriate lab and imaging results for this patient.[address], PA-C 03/11/16 [place], MD 03/13/16 1517

## 2016-03-11 NOTE — Discharge Instructions (Signed)
Examination favors urinary tract infection. Please increase water and juices. Please use Keflex 4 times daily, use Pyridium 3 times daily with a meal. This medication may cause your urine to be are anxious/red in color. Please see Dr. Moshe Cipro for recheck if not improving. Urinary Tract Infection Urinary tract infections (UTIs) can develop anywhere along your urinary tract. Your urinary tract is your body's drainage system for removing wastes and extra water. Your urinary tract includes two kidneys, two ureters, a bladder, and a urethra. Your kidneys are a pair of bean-shaped organs. Each kidney is about the size of your fist. They are located below your ribs, one on each side of your spine. CAUSES Infections are caused by microbes, which are microscopic organisms, including fungi, viruses, and bacteria. These organisms are so small that they can only be seen through a microscope. Bacteria are the microbes that most commonly cause UTIs. SYMPTOMS  Symptoms of UTIs may vary by age and gender of the patient and by the location of the infection. Symptoms in young women typically include a frequent and intense urge to urinate and a painful, burning feeling in the bladder or urethra during urination. Older women and men are more likely to be tired, shaky, and weak and have muscle aches and abdominal pain. A fever may mean the infection is in your kidneys. Other symptoms of a kidney infection include pain in your back or sides below the ribs, nausea, and vomiting. DIAGNOSIS To diagnose a UTI, your caregiver will ask you about your symptoms. Your caregiver will also ask you to provide a urine sample. The urine sample will be tested for bacteria and white blood cells. White blood cells are made by your body to help fight infection. TREATMENT  Typically, UTIs can be treated with medication. Because most UTIs are caused by a bacterial infection, they usually can be treated with the use of antibiotics. The choice of  antibiotic and length of treatment depend on your symptoms and the type of bacteria causing your infection. HOME CARE INSTRUCTIONS  If you were prescribed antibiotics, take them exactly as your caregiver instructs you. Finish the medication even if you feel better after you have only taken some of the medication.  Drink enough water and fluids to keep your urine clear or pale yellow.  Avoid caffeine, tea, and carbonated beverages. They tend to irritate your bladder.  Empty your bladder often. Avoid holding urine for long periods of time.  Empty your bladder before and after sexual intercourse.  After a bowel movement, women should cleanse from front to back. Use each tissue only once. SEEK MEDICAL CARE IF:   You have back pain.  You develop a fever.  Your symptoms do not begin to resolve within 3 days. SEEK IMMEDIATE MEDICAL CARE IF:   You have severe back pain or lower abdominal pain.  You develop chills.  You have nausea or vomiting.  You have continued burning or discomfort with urination. MAKE SURE YOU:   Understand these instructions.  Will watch your condition.  Will get help right away if you are not doing well or get worse.   This information is not intended to replace advice given to you by your health care provider. Make sure you discuss any questions you have with your health care provider.   Document Released: 06/21/2005 Document Revised: 06/02/2015 Document Reviewed: 10/20/2011 Elsevier Interactive Patient Education [employer].

## 2016-03-11 NOTE — ED Notes (Signed)
Patient c/o dysuria and frequency that started on Thursday night. Per patient some blood noted in urine.

## 2016-03-13 DIAGNOSIS — J040 Acute laryngitis: Secondary | ICD-10-CM | POA: Diagnosis not present

## 2016-03-13 DIAGNOSIS — J301 Allergic rhinitis due to pollen: Secondary | ICD-10-CM | POA: Diagnosis not present

## 2016-03-13 DIAGNOSIS — J322 Chronic ethmoidal sinusitis: Secondary | ICD-10-CM | POA: Diagnosis not present

## 2016-03-13 DIAGNOSIS — J320 Chronic maxillary sinusitis: Secondary | ICD-10-CM | POA: Diagnosis not present

## 2016-03-13 MED FILL — CEFUROXIME AXETIL 250 MG TA: 250 | 8 days supply | Qty: 15 | Fill #0

## 2016-03-13 MED FILL — LACTINEX CHEWABLE TABLET: 25 days supply | Qty: 50 | Fill #0

## 2016-03-14 LAB — URINE CULTURE: SPECIAL REQUESTS: NORMAL

## 2016-03-15 ENCOUNTER — Telehealth (HOSPITAL_BASED_OUTPATIENT_CLINIC_OR_DEPARTMENT_OTHER): Payer: Self-pay | Admitting: Emergency Medicine

## 2016-03-15 NOTE — Telephone Encounter (Signed)
Post ED Visit - Positive Culture Follow-up  Culture report reviewed by antimicrobial stewardship pharmacist:  []  Elenor Quinones, Pharm.D. []  Heide Guile, Pharm.D., BCPS []  Parks Neptune, Pharm.D. []  Alycia Rossetti, Pharm.D., BCPS []  [employer], Pharm.D., BCPS, AAHIVP []  Legrand Como, Pharm.D., BCPS, AAHIVP [x]  Milus Glazier, Pharm.D. []  Stephens November, [place].D.  Positive urine culture Treated with cephalexin, organism sensitive to the same and no further patient follow-up is required at this time.  Hazle Nordmann 03/15/2016, 10:16 AM

## 2016-03-20 ENCOUNTER — Ambulatory Visit (INDEPENDENT_AMBULATORY_CARE_PROVIDER_SITE_OTHER): Payer: 59 | Admitting: Family Medicine

## 2016-03-20 ENCOUNTER — Encounter: Payer: Self-pay | Admitting: Family Medicine

## 2016-03-20 VITALS — BP 124/82 | HR 84 | Resp 16 | Ht 62.0 in | Wt 203.8 lb

## 2016-03-20 DIAGNOSIS — J301 Allergic rhinitis due to pollen: Secondary | ICD-10-CM | POA: Diagnosis not present

## 2016-03-20 DIAGNOSIS — J322 Chronic ethmoidal sinusitis: Secondary | ICD-10-CM | POA: Diagnosis not present

## 2016-03-20 DIAGNOSIS — F17218 Nicotine dependence, cigarettes, with other nicotine-induced disorders: Secondary | ICD-10-CM | POA: Diagnosis not present

## 2016-03-20 DIAGNOSIS — I10 Essential (primary) hypertension: Secondary | ICD-10-CM

## 2016-03-20 DIAGNOSIS — J320 Chronic maxillary sinusitis: Secondary | ICD-10-CM | POA: Diagnosis not present

## 2016-03-20 NOTE — Progress Notes (Signed)
   Subjective:    Patient ID: [name] [name], female    DOB: 1961/05/18, 55 y.o.   MRN: [id number]  HPI   [name] [name]     MRN: [id number]      DOB: Apr 14, 1961   HPI [name] [name] is here for follow up and re-evaluation of chronic medical conditions, medication management and review of any available recent lab and radiology data.  Preventive health is updated, specifically  Cancer screening and Immunization.   Questions or concerns regarding consultations or procedures which the PT has had in the interim are  Addressed.Has been seeing both neurology and eNT for headache management and has f/u today also Had E coli UTI and needed eD treatment this past weekend The PT denies any adverse reactions to current medications since the last visit.     ROS Denies recent fever or chills. Denies sinus pressure, nasal congestion, ear pain or sore throat. Denies chest congestion, productive cough or wheezing. Denies chest pains, palpitations and leg swelling Denies abdominal pain, nausea, vomiting,diarrhea or constipation.   Denies dysuria, frequency, hesitancy or incontinence. Denies joint pain, swelling and limitation in mobility. g. Denies depression, anxiety or insomnia. Denies skin break down or rash.   PE  BP 124/82 mmHg  Pulse 84  Resp 16  Ht 5\' 2"  (1.575 m)  Wt 203 lb 12.8 oz (92.443 kg)  BMI 37.27 kg/m2  SpO2 97%  Patient alert and oriented and in no cardiopulmonary distress.  HEENT: No facial asymmetry, EOMI,   oropharynx pink and moist.  Neck supple no JVD, no mass.  Chest: Clear to auscultation bilaterally.  CVS: S1, S2 no murmurs, no S3.Regular rate.  ABD: Soft non tender.   Ext: No edema  MS: Adequate ROM spine, shoulders, hips and knees.  Skin: Intact, no ulcerations or rash noted.  Psych: Good eye contact, normal affect. Memory intact not anxious or depressed appearing.  CNS: CN 2-12 intact, power,  normal throughout.no focal deficits noted.   Assessment &  Plan   Essential hypertension Controlled, no change in medication DASH diet and commitment to daily physical activity for a minimum of 30 minutes discussed and encouraged, as a part of hypertension management. The importance of attaining a healthy weight is also discussed.  BP/Weight 03/20/2016 03/11/2016 03/06/2016 02/28/2016 02/16/2016 02/10/2016 [id number]  Systolic BP A999333 123456 0000000 [id number] [id number] 123456 0000000  Diastolic BP 82 74 90 77 84 96 90  Wt. (Lbs) 203.8 198 202.2 202 201 201.4 204.12  BMI 37.27 36.21 36.97 36.94 36.75 36.83 37.32        Nicotine dependence Patient counseled for approximately 5 minutes regarding the health risks of ongoing nicotine use, specifically all types of cancer, heart disease, stroke and respiratory failure. The options available for help with cessation ,the behavioral changes to assist the process, and the option to either gradully reduce usage  Or abruptly stop.is also discussed. Pt is also encouraged to set specific goals in number of cigarettes used daily, as well as to set a quit date.          Review of Systems     Objective:   Physical Exam        Assessment & Plan:

## 2016-03-20 NOTE — Assessment & Plan Note (Signed)
Controlled, no change in medication DASH diet and commitment to daily physical activity for a minimum of 30 minutes discussed and encouraged, as a part of hypertension management. The importance of attaining a healthy weight is also discussed.  BP/Weight 03/20/2016 03/11/2016 03/06/2016 02/28/2016 02/16/2016 02/10/2016 [id number]  Systolic BP A999333 123456 0000000 123XX123 [id number] 123456 0000000  Diastolic BP 82 74 90 77 84 96 90  Wt. (Lbs) 203.8 198 202.2 202 201 201.4 204.12  BMI 37.27 36.21 36.97 36.94 36.75 36.83 37.32

## 2016-03-20 NOTE — Patient Instructions (Signed)
Annual exam in [place] as before  Excellent blood pressure   Work on smoking cessation please  Thankful you are  Feeling better

## 2016-03-20 NOTE — Assessment & Plan Note (Signed)

## 2016-03-27 MED FILL — HYDROCHLOROTHIAZIDE 25 MG T: 25 | 90 days supply | Qty: 90 | Fill #2

## 2016-04-03 ENCOUNTER — Other Ambulatory Visit: Payer: Self-pay | Admitting: Family Medicine

## 2016-04-03 DIAGNOSIS — Z1231 Encounter for screening mammogram for malignant neoplasm of breast: Secondary | ICD-10-CM

## 2016-05-01 ENCOUNTER — Ambulatory Visit (HOSPITAL_COMMUNITY)
Admission: RE | Admit: 2016-05-01 | Discharge: 2016-05-01 | Disposition: A | Discharge status: Home / Self Care | Payer: 59 | Source: Ambulatory Visit | Attending: Family Medicine | Admitting: Family Medicine

## 2016-05-01 DIAGNOSIS — Z1231 Encounter for screening mammogram for malignant neoplasm of breast: Secondary | ICD-10-CM | POA: Insufficient documentation

## 2016-05-02 MED FILL — KLOR-CON M20 TABLET: 20 | 90 days supply | Qty: 90 | Fill #1

## 2016-05-02 MED FILL — AMLODIPINE BESYLATE 2.5 MG: 2.5 | 90 days supply | Qty: 90 | Fill #1

## 2016-05-02 MED FILL — IBUPROFEN 800 MG TABLET: 800 | 30 days supply | Qty: 30 | Fill #3

## 2016-05-02 MED FILL — FLUTICASONE PROP 50 MCG SPR: 50 | 90 days supply | Qty: 48 | Fill #1

## 2016-05-18 ENCOUNTER — Other Ambulatory Visit (HOSPITAL_COMMUNITY)
Admission: RE | Admit: 2016-05-18 | Discharge: 2016-05-18 | Disposition: A | Discharge status: Home / Self Care | Payer: 59 | Source: Ambulatory Visit | Attending: Family Medicine | Admitting: Family Medicine

## 2016-05-18 ENCOUNTER — Encounter (INDEPENDENT_AMBULATORY_CARE_PROVIDER_SITE_OTHER): Payer: Self-pay

## 2016-05-18 ENCOUNTER — Ambulatory Visit (INDEPENDENT_AMBULATORY_CARE_PROVIDER_SITE_OTHER): Payer: 59 | Admitting: Family Medicine

## 2016-05-18 ENCOUNTER — Encounter: Payer: Self-pay | Admitting: Family Medicine

## 2016-05-18 VITALS — BP 120/88 | HR 92 | Resp 16 | Ht 62.0 in | Wt 199.1 lb

## 2016-05-18 DIAGNOSIS — R7303 Prediabetes: Secondary | ICD-10-CM

## 2016-05-18 DIAGNOSIS — IMO0001 Reserved for inherently not codable concepts without codable children: Secondary | ICD-10-CM

## 2016-05-18 DIAGNOSIS — F17208 Nicotine dependence, unspecified, with other nicotine-induced disorders: Secondary | ICD-10-CM

## 2016-05-18 DIAGNOSIS — I10 Essential (primary) hypertension: Secondary | ICD-10-CM

## 2016-05-18 DIAGNOSIS — Z0000 Encounter for general adult medical examination without abnormal findings: Secondary | ICD-10-CM | POA: Diagnosis not present

## 2016-05-18 DIAGNOSIS — Z01419 Encounter for gynecological examination (general) (routine) without abnormal findings: Secondary | ICD-10-CM | POA: Diagnosis not present

## 2016-05-18 DIAGNOSIS — Z1211 Encounter for screening for malignant neoplasm of colon: Secondary | ICD-10-CM

## 2016-05-18 DIAGNOSIS — Z1151 Encounter for screening for human papillomavirus (HPV): Secondary | ICD-10-CM | POA: Insufficient documentation

## 2016-05-18 DIAGNOSIS — E559 Vitamin D deficiency, unspecified: Secondary | ICD-10-CM

## 2016-05-18 DIAGNOSIS — Z124 Encounter for screening for malignant neoplasm of cervix: Secondary | ICD-10-CM

## 2016-05-18 LAB — BASIC METABOLIC PANEL
BUN: 13 mg/dL (ref 7–25)
CALCIUM: 10.2 mg/dL (ref 8.6–10.4)
CO2: 27 mmol/L (ref 20–31)
CREATININE: 0.65 mg/dL (ref 0.50–1.05)
Chloride: 104 mmol/L (ref 98–110)
GLUCOSE: 79 mg/dL (ref 65–99)
Potassium: 4.3 mmol/L (ref 3.5–5.3)
SODIUM: 143 mmol/L (ref 135–146)

## 2016-05-18 LAB — HEMOGLOBIN A1C
Hgb A1c MFr Bld: 5.5 % (ref <5.7)
MEAN PLASMA GLUCOSE: 111 mg/dL

## 2016-05-18 LAB — POC HEMOCCULT BLD/STL (OFFICE/1-CARD/DIAGNOSTIC): Fecal Occult Blood, POC: NEGATIVE

## 2016-05-18 LAB — TSH: TSH: 1.39 m[IU]/L

## 2016-05-18 NOTE — Assessment & Plan Note (Signed)
Improved. Pt applauded on succesful weight loss through lifestyle change, and encouraged to continue same. Weight loss goal set for the next several months.  

## 2016-05-18 NOTE — Assessment & Plan Note (Signed)

## 2016-05-18 NOTE — Progress Notes (Signed)
    [name] [name]     MRN: [id number]      DOB: [birth year]  HPI: Patient is in for annual physical exam. No other health concerns are expressed or addressed at the visit. Recent labs, if available are reviewed. Immunization is reviewed , and  updated if needed.   PE: Pleasant  female, alert and oriented x 3, in no cardio-pulmonary distress. Afebrile. HEENT No facial trauma or asymetry. Sinuses non tender.  Extra occullar muscles intact, pupils equally reactive to light. External ears normal, tympanic membranes clear. Oropharynx moist, no exudate. Neck: supple, no adenopathy,JVD or thyromegaly.No bruits.  Chest: Clear to ascultation bilaterally.No crackles or wheezes. Non tender to palpation  Breast: No asymetry,no masses or lumps. No tenderness. No nipple discharge or inversion. No axillary or supraclavicular adenopathy  Cardiovascular system; Heart sounds normal,  S1 and  S2 ,no S3.  No murmur, or thrill. Apical beat not displaced Peripheral pulses normal.  Abdomen: Soft, non tender, no organomegaly or masses. No bruits. Bowel sounds normal. No guarding, tenderness or rebound.  Rectal:  Normal sphincter tone. No rectal mass. Guaiac negative stool.  GU: External genitalia normal female genitalia , normal female distribution of hair. No lesions. Urethral meatus normal in size, no  Prolapse, no lesions visibly  Present. Bladder non tender. Vagina pink and moist , with no visible lesions , discharge present . Adequate pelvic support no  cystocele or rectocele noted Cervix pink and appears healthy, no lesions or ulcerations noted, no discharge noted from os Uterus normal size, no adnexal masses, no cervical motion or adnexal tenderness.   Musculoskeletal exam: Full ROM of spine, hips , shoulders and knees. No deformity ,swelling or crepitus noted. No muscle wasting or atrophy.   Neurologic: Cranial nerves 2 to 12 intact. Power, tone ,sensation and reflexes  normal throughout. No disturbance in gait. No tremor.  Skin: Intact, no ulceration, erythema , scaling or rash noted. Pigmentation normal throughout  Psych; Normal mood and affect. Judgement and concentration normal   Assessment & Plan:  Annual physical exam Annual exam as documented. Counseling done  re healthy lifestyle involving commitment to 150 minutes exercise per week, heart healthy diet, and attaining healthy weight.The importance of adequate sleep also discussed. Regular seat belt use and home safety, is also discussed. Changes in health habits are decided on by the patient with goals and time frames  set for achieving them. Immunization and cancer screening needs are specifically addressed at this visit.   Nicotine dependence Patient counseled for approximately 5 minutes regarding the health risks of ongoing nicotine use, specifically all types of cancer, heart disease, stroke and respiratory failure. The options available for help with cessation ,the behavioral changes to assist the process, and the option to either gradully reduce usage  Or abruptly stop.is also discussed. Pt is also encouraged to set specific goals in number of cigarettes used daily, as well as to set a quit date. 3 per day   Obesity, Class II, BMI 35-39.9, with comorbidity Improved. Pt applauded on succesful weight loss through lifestyle change, and encouraged to continue same. Weight loss goal set for the next several months.

## 2016-05-18 NOTE — Assessment & Plan Note (Signed)
Patient counseled for approximately 5 minutes regarding the health risks of ongoing nicotine use, specifically all types of cancer, heart disease, stroke and respiratory failure. The options available for help with cessation ,the behavioral changes to assist the process, and the option to either gradully reduce usage  Or abruptly stop.is also discussed. Pt is also encouraged to set specific goals in number of cigarettes used daily, as well as to set a quit date. 3 per day

## 2016-05-18 NOTE — Patient Instructions (Addendum)
F/u in 4 month, call if you need me sooner  Labs today Please work on good  health habits so that your health will improve. 1. Commitment to daily physical activity for 30 to 60  minutes, if you are able to do this.  2. Commitment to wise food choices. Aim for half of your  food intake to be vegetable and fruit, one quarter starchy foods, and one quarter protein. Try to eat on a regular schedule  3 meals per day, snacking between meals should be limited to vegetables or fruits or small portions of nuts. 64 ounces of water per day is generally recommended, unless you have specific health conditions, like heart failure or kidney failure where you will need to limit fluid intake.  3. Commitment to sufficient and a  good quality of physical and mental rest daily, generally between 6 to 8 hours per day.  WITH PERSISTANCE AND PERSEVERANCE, THE IMPOSSIBLE , BECOMES THE NORM! Thank you  for choosing [facility]. We consider it a privelige to serve you.  Delivering excellent health care in a caring and  compassionate way is our goal.  Partnering with you,  so that together we can achieve this goal is our strategy.    You Can Quit Smoking If you are ready to quit smoking or are thinking about it, congratulations! You have chosen to help yourself be healthier and live longer! There are lots of different ways to quit smoking. Nicotine gum, nicotine patches, a nicotine inhaler, or nicotine nasal spray can help with physical craving. Hypnosis, support groups, and medicines help break the habit of smoking. TIPS TO GET OFF AND STAY OFF CIGARETTES  Learn to predict your moods. Do not let a bad situation be your excuse to have a cigarette. Some situations in your life might tempt you to have a cigarette.  Ask friends and co-workers not to smoke around you.  Make your home smoke-free.  Never have "just one" cigarette. It leads to wanting another and another. Remind yourself of your decision  to quit.  On a card, make a list of your reasons for not smoking. Read it at least the same number of times a day as you have a cigarette. Tell yourself everyday, "I do not want to smoke. I choose not to smoke."  Ask someone at home or work to help you with your plan to quit smoking.  Have something planned after you eat or have a cup of coffee. Take a walk or get other exercise to perk you up. This will help to keep you from overeating.  Try a relaxation exercise to calm you down and decrease your stress. Remember, you may be tense and nervous the first two weeks after you quit. This will pass.  Find new activities to keep your hands busy. Play with a pen, coin, or rubber band. Doodle or draw things on paper.  Brush your teeth right after eating. This will help cut down the craving for the taste of tobacco after meals. You can try mouthwash too.  Try gum, breath mints, or diet candy to keep something in your mouth. IF YOU SMOKE AND WANT TO QUIT:  Do not stock up on cigarettes. Never buy a carton. Wait until one pack is finished before you buy another.  Never carry cigarettes with you at work or at home.  Keep cigarettes as far away from you as possible. Leave them with someone else.  Never carry matches or a lighter with you.  Ask yourself, "Do I need this cigarette or is this just a reflex?"  Bet with someone that you can quit. Put cigarette money in a piggy bank every morning. If you smoke, you give up the money. If you do not smoke, by the end of the week, you keep the money.  Keep trying. It takes 21 days to change a habit!  Talk to your doctor about using medicines to help you quit. These include nicotine replacement gum, lozenges, or skin patches.   This information is not intended to replace advice given to you by your health care provider. Make sure you discuss any questions you have with your health care provider.   Document Released: 07/08/2009 Document Revised:  12/04/2011 Document Reviewed: 07/08/2009 Elsevier Interactive Patient Education [employer].

## 2016-05-19 ENCOUNTER — Other Ambulatory Visit: Payer: Self-pay

## 2016-05-19 LAB — VITAMIN D 25 HYDROXY (VIT D DEFICIENCY, FRACTURES): VIT D 25 HYDROXY: 19 ng/mL — AB (ref 30–100)

## 2016-05-19 LAB — CYTOLOGY - PAP

## 2016-05-19 MED ORDER — VITAMIN D (ERGOCALCIFEROL) 1.25 MG (50000 UNIT) PO CAPS: 50000.0000 [IU] | ORAL_CAPSULE | ORAL | 5 refills | Status: DC

## 2016-05-19 MED FILL — VIT D2 1.25 MG (50,000 UNIT: 1.25 MG | 28 days supply | Qty: 4 | Fill #0

## 2016-06-29 MED FILL — IBUPROFEN 800 MG TABLET: 800 | 30 days supply | Qty: 30 | Fill #4

## 2016-06-29 MED FILL — HYDROCHLOROTHIAZIDE 25 MG T: 25 | 90 days supply | Qty: 90 | Fill #3

## 2016-07-31 MED FILL — KLOR-CON M20 TABLET: 20 | 90 days supply | Qty: 90 | Fill #2

## 2016-07-31 MED FILL — AMLODIPINE BESYLATE 2.5 MG: 2.5 | 90 days supply | Qty: 90 | Fill #2

## 2016-09-04 ENCOUNTER — Encounter: Payer: Self-pay | Admitting: Family Medicine

## 2016-09-04 ENCOUNTER — Ambulatory Visit (INDEPENDENT_AMBULATORY_CARE_PROVIDER_SITE_OTHER): Payer: 59 | Admitting: Family Medicine

## 2016-09-04 VITALS — BP 128/88 | HR 74 | Resp 16 | Ht 62.0 in | Wt 203.0 lb

## 2016-09-04 DIAGNOSIS — IMO0001 Reserved for inherently not codable concepts without codable children: Secondary | ICD-10-CM

## 2016-09-04 DIAGNOSIS — I10 Essential (primary) hypertension: Secondary | ICD-10-CM

## 2016-09-04 DIAGNOSIS — E669 Obesity, unspecified: Secondary | ICD-10-CM

## 2016-09-04 DIAGNOSIS — J302 Other seasonal allergic rhinitis: Secondary | ICD-10-CM

## 2016-09-04 DIAGNOSIS — R7303 Prediabetes: Secondary | ICD-10-CM | POA: Diagnosis not present

## 2016-09-04 DIAGNOSIS — E559 Vitamin D deficiency, unspecified: Secondary | ICD-10-CM

## 2016-09-04 DIAGNOSIS — F17218 Nicotine dependence, cigarettes, with other nicotine-induced disorders: Secondary | ICD-10-CM

## 2016-09-04 DIAGNOSIS — E785 Hyperlipidemia, unspecified: Secondary | ICD-10-CM

## 2016-09-04 NOTE — Assessment & Plan Note (Signed)
Deteriorated. Patient re-educated about  the importance of commitment to a  minimum of 150 minutes of exercise per week.  The importance of healthy food choices with portion control discussed. Encouraged to start a food diary, count calories and to consider  joining a support group. Sample diet sheets offered. Goals set by the patient for the next several months.   Weight /BMI 09/04/2016 05/18/2016 03/20/2016  WEIGHT 203 lb 199 lb 1.9 oz 203 lb 12.8 oz  HEIGHT 5\' 2"  5\' 2"  5\' 2"   BMI 37.13 kg/m2 36.42 kg/m2 37.27 kg/m2

## 2016-09-04 NOTE — Assessment & Plan Note (Signed)
Updated lab needed at/ before next visit.   

## 2016-09-04 NOTE — Assessment & Plan Note (Signed)

## 2016-09-04 NOTE — Assessment & Plan Note (Signed)
Sub optimal control, no med change DASH diet and commitment to daily physical activity for a minimum of 30 minutes discussed and encouraged, as a part of hypertension management. The importance of attaining a healthy weight is also discussed.  BP/Weight 09/04/2016 05/18/2016 03/20/2016 03/11/2016 03/06/2016 02/28/2016 [id number]  Systolic BP 0000000 123456 A999333 123456 0000000 [id number]  Diastolic BP 88 88 82 74 90 77 84  Wt. (Lbs) 203 199.12 203.8 198 202.2 202 201  BMI 37.13 36.42 37.27 36.21 36.97 36.94 36.75

## 2016-09-04 NOTE — Patient Instructions (Signed)
F/u in 4 month, call if you need me before  Happy Birthday!  Fasting lipid, chem 7, Vit D , hBA1C in 4 months  It is important that you exercise regularly at least 30 minutes 5 times a week. If you develop chest pain, have severe difficulty breathing, or feel very tired, stop exercising immediately and seek medical attention   Thank you  for choosing [facility]. We consider it a privelige to serve you.  Delivering excellent health care in a caring and  compassionate way is our goal.  Partnering with you,  so that together we can achieve this goal is our strategy. Merry Christmas and all the best for 2018

## 2016-09-04 NOTE — Progress Notes (Signed)
[name] [name]     MRN: [id number]      DOB: 11/21/1960   HPI [name] [name] is here for follow up and re-evaluation of chronic medical conditions, medication management and review of any available recent lab and radiology data.  Preventive health is updated, specifically  Cancer screening and Immunization.   Questions or concerns regarding consultations or procedures which the PT has had in the interim are  addressed. The PT denies any adverse reactions to current medications since the last visit.  There are no new concerns.  There are no specific complaints  Feels very well, except no exercise commitment and weight gain  ROS Denies recent fever or chills. Denies sinus pressure, nasal congestion, ear pain or sore throat. Denies chest congestion, productive cough or wheezing. Denies chest pains, palpitations and leg swelling Denies abdominal pain, nausea, vomiting,diarrhea or constipation.   Denies dysuria, frequency, hesitancy or incontinence. Denies joint pain, swelling and limitation in mobility. Denies headaches, seizures, numbness, or tingling. Denies depression, anxiety or insomnia. Denies skin break down or rash.   PE  BP 128/88   Pulse 74   Resp 16   Ht 5\' 2"  (1.575 m)   Wt 203 lb (92.1 kg)   SpO2 96%   BMI 37.13 kg/m   Patient alert and oriented and in no cardiopulmonary distress.  HEENT: No facial asymmetry, EOMI,   oropharynx pink and moist.  Neck supple no JVD, no mass.  Chest: Clear to auscultation bilaterally.  CVS: S1, S2 no murmurs, no S3.Regular rate.  ABD: Soft non tender.   Ext: No edema  MS: Adequate ROM spine, shoulders, hips and knees.  Skin: Intact, no ulcerations or rash noted.  Psych: Good eye contact, normal affect. Memory intact not anxious or depressed appearing.  CNS: CN 2-12 intact, power,  normal throughout.no focal deficits noted.   Assessment & Plan  Essential hypertension Sub optimal control, no med change DASH diet and  commitment to daily physical activity for a minimum of 30 minutes discussed and encouraged, as a part of hypertension management. The importance of attaining a healthy weight is also discussed.  BP/Weight 09/04/2016 05/18/2016 03/20/2016 03/11/2016 03/06/2016 02/28/2016 [id number]  Systolic BP 0000000 123456 [id number] 123456 0000000 [id number]  Diastolic BP 88 88 82 74 90 77 84  Wt. (Lbs) 203 199.12 203.8 198 202.2 202 201  BMI 37.13 36.42 37.27 36.21 36.97 36.94 36.75       Nicotine dependence Patient counseled for approximately 5 minutes regarding the health risks of ongoing nicotine use, specifically all types of cancer, heart disease, stroke and respiratory failure. The options available for help with cessation ,the behavioral changes to assist the process, and the option to either gradully reduce usage  Or abruptly stop.is also discussed. Pt is also encouraged to set specific goals in number of cigarettes used daily, as well as to set a quit date.     Obesity, Class II, BMI 35-39.9, with comorbidity Deteriorated. Patient re-educated about  the importance of commitment to a  minimum of 150 minutes of exercise per week.  The importance of healthy food choices with portion control discussed. Encouraged to start a food diary, count calories and to consider  joining a support group. Sample diet sheets offered. Goals set by the patient for the next several months.   Weight /BMI 09/04/2016 05/18/2016 03/20/2016  WEIGHT 203 lb 199 lb 1.9 oz 203 lb 12.8 oz  HEIGHT 5\' 2"  5\' 2"  5\' 2"   BMI 37.13 kg/m2  36.42 kg/m2 37.27 kg/m2      Vitamin D deficiency Updated lab needed at/ before next visit.   Allergic rhinitis Controlled, no change in medication

## 2016-09-04 NOTE — Assessment & Plan Note (Signed)
Controlled, no change in medication  

## 2016-09-26 ENCOUNTER — Encounter (HOSPITAL_COMMUNITY): Payer: Self-pay | Admitting: Emergency Medicine

## 2016-09-26 ENCOUNTER — Emergency Department (HOSPITAL_COMMUNITY)
Admission: EM | Admit: 2016-09-26 | Discharge: 2016-09-26 | Disposition: A | Discharge status: Home / Self Care | Payer: 59 | Attending: Emergency Medicine | Admitting: Emergency Medicine

## 2016-09-26 ENCOUNTER — Emergency Department (HOSPITAL_COMMUNITY): Payer: 59

## 2016-09-26 DIAGNOSIS — Z853 Personal history of malignant neoplasm of breast: Secondary | ICD-10-CM | POA: Diagnosis not present

## 2016-09-26 DIAGNOSIS — M25552 Pain in left hip: Secondary | ICD-10-CM

## 2016-09-26 DIAGNOSIS — F17210 Nicotine dependence, cigarettes, uncomplicated: Secondary | ICD-10-CM | POA: Diagnosis not present

## 2016-09-26 DIAGNOSIS — M5186 Other intervertebral disc disorders, lumbar region: Secondary | ICD-10-CM | POA: Diagnosis not present

## 2016-09-26 DIAGNOSIS — M519 Unspecified thoracic, thoracolumbar and lumbosacral intervertebral disc disorder: Secondary | ICD-10-CM

## 2016-09-26 DIAGNOSIS — I10 Essential (primary) hypertension: Secondary | ICD-10-CM | POA: Diagnosis not present

## 2016-09-26 DIAGNOSIS — M5126 Other intervertebral disc displacement, lumbar region: Secondary | ICD-10-CM | POA: Diagnosis not present

## 2016-09-26 MED ORDER — ACETAMINOPHEN 500 MG PO TABS
1000.0000 mg | ORAL_TABLET | Freq: Once | ORAL | Status: AC
  Administered 2016-09-26: 1000 mg via ORAL
  Filled 2016-09-26: qty 2

## 2016-09-26 MED ORDER — DEXAMETHASONE SODIUM PHOSPHATE 4 MG/ML IJ SOLN
10.0000 mg | Freq: Once | INTRAMUSCULAR | Status: AC
  Administered 2016-09-26: 10 mg via INTRAMUSCULAR
  Filled 2016-09-26: qty 3

## 2016-09-26 MED ORDER — NAPROXEN 500 MG PO TABS: 500.0000 mg | ORAL_TABLET | Freq: Two times a day (BID) | ORAL | 0 refills | Status: DC

## 2016-09-26 MED ORDER — PREDNISONE 10 MG PO TABS: 20.0000 mg | ORAL_TABLET | Freq: Every day | ORAL | 0 refills | Status: AC

## 2016-09-26 MED ORDER — HYDROCODONE-ACETAMINOPHEN 5-325 MG PO TABS: 2.0000 | ORAL_TABLET | ORAL | 0 refills | Status: DC | PRN

## 2016-09-26 NOTE — ED Provider Notes (Signed)
[facility] DEPT Provider Note   CSN: [id number] Arrival date & time: 09/26/16  1518     History   Chief Complaint Chief Complaint  Patient presents with  . Hip Pain    HPI [name] [name] is a 56 y.o. female with past medical history of adenocarcinoma of the left breast, hyperlipidemia, hypertension, obesity presents to the emergency department with sudden onset, sharp, constant left sided low back pain that radiates to the left buttock. Patient states that walking, lifting, mild twists in back exacerbates the pain. Patient states she has been in tears from the pain. Patient was able to go to work today. 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 Patient states she took 800 mg of ibuprofen at 5 AM today which did not help. Patient states she was told she had sciatica more than 3 years ago, was given a shot emergency department which helped with the pain. Patient denies numbness, tingling, weakness of lower extremities. Patient denies low back pain radiating down her legs. Patient denies recent trauma, fevers, nausea, vomiting, recent unexpected weight loss. Patient denies incontinence and bladder retention.  Per brief chart review, patient was diagnosed with left malignant neoplasm of the breast in 2006, she is now status post lumpectomy with axillary lymph node dissection and chemotherapy 4. Last seen by oncology: 06/05 last found to be in remission.  HPI  Past Medical History:  Diagnosis Date  . Adenocarcinoma of breast ([place])    left   . Heel spur   . Hyperlipidemia   . Hypertension   . Migraines   . Obesity     Patient Active Problem List   Diagnosis Date Noted  . 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 Carotid bruit 10/27/2014  . Metabolic syndrome X [id number]  . Nicotine dependence 06/30/2013  . Vitamin D deficiency 08/12/2012  . Prediabetes 04/08/2012    . Allergic rhinitis 01/21/2008  . Malignant neoplasm of breast (female) ([place]) 01/17/2008  . Hyperlipemia 01/17/2008  . Obesity, Class II, BMI 35-39.9, with comorbidity 01/17/2008  . Essential hypertension 01/17/2008    Past Surgical History:  Procedure Laterality Date  . btl  1992  . CHOLECYSTECTOMY    . COLONOSCOPY  09/15/2011   Procedure: COLONOSCOPY;  Surgeon: Dorothyann Peng, MD;  Location: [facility];  Service: Endoscopy;  Laterality: N/A;  9:15 AM  . left lumpectomy and lymph node disection  2000  . r heel surgery for spur and chipped bone  11/09/20099  . TUBAL LIGATION      OB History    Gravida Para Term Preterm AB Living   2 2 2     2    SAB TAB Ectopic Multiple Live Births                   Home Medications    Prior to Admission medications   Medication Sig Start Date End Date Taking? Authorizing Provider  amLODipine (NORVASC) 2.5 MG tablet Take 1 tablet (2.5 mg total) by mouth daily. 02/10/16   Fayrene Helper, MD  Biotin 1000 MCG tablet Take 1,000 mcg by mouth daily.     Historical Provider, MD  fluticasone (FLONASE) 50 MCG/ACT nasal spray USE 2 SPRAYS IN EACH NOSTRIL ONCE DAILY Patient taking differently: USE 2 SPRAYS IN EACH NOSTRIL ONCE DAILY AS NEEDED FOR ALLERGIES. 02/11/16   Fayrene Helper, MD  hydrochlorothiazide (HYDRODIURIL) 25 MG tablet TAKE 1 TABLET BY MOUTH DAILY 09/24/15   Fayrene Helper, MD  HYDROcodone-acetaminophen (NORCO/VICODIN) 5-325 MG tablet Take 2 tablets by mouth every 4 (four) hours as needed. 09/26/16   Kinnie Feil, PA-C  ibuprofen (ADVIL,MOTRIN) 800 MG tablet TAKE 1 TABLET BY MOUTH ONCE DAILY AS NEEDED Patient taking differently: TAKE 1 TABLET BY MOUTH ONCE DAILY AS NEEDED FOR PAIN. 12/23/15   Fayrene Helper, MD  Loratadine 10 MG CAPS Take 1 capsule (10 mg total) by mouth daily. 01/03/16   Fayrene Helper, MD  naproxen (NAPROSYN) 500 MG tablet Take 1 tablet (500 mg total) by mouth 2 (two) times daily. 09/26/16   Kinnie Feil, PA-C  potassium chloride SA (K-DUR,KLOR-CON) 20 MEQ tablet TAKE 1 TABLET BY MOUTH ONCE DAILY 02/03/16   Fayrene Helper, MD  predniSONE (DELTASONE) 10 MG tablet Take 2 tablets (20 mg total) by mouth daily. 09/26/16 10/01/16  Kinnie Feil, PA-C  Vitamin D, Ergocalciferol, (DRISDOL) 50000 units CAPS capsule Take 1 capsule (50,000 Units total) by mouth every 7 (seven) days. 05/19/16   Fayrene Helper, MD    Family History Family History  Problem Relation Age of Onset  . Heart attack Mother   . Heart attack Father   . Glaucoma Father   . Hypertension Father   . Diabetes Sister   . Anesthesia problems Neg Hx   . Hypotension Neg Hx   . Malignant hyperthermia Neg Hx   . Pseudochol deficiency Neg Hx   . Migraines Neg Hx     Social History Social History  Substance Use Topics  . Smoking status: Current Every Day Smoker    Packs/day: 0.25    Years: 15.00    Types: Cigarettes  . Smokeless tobacco: Never Used     Comment: smoking 3 per day  . Alcohol use No     Allergies   Ace inhibitors   Review of Systems Review of Systems  Constitutional: Negative for appetite change, chills, fever and unexpected weight change.  HENT: Negative for congestion and sore throat.   Eyes: Negative for visual disturbance.  Respiratory: Negative for cough, choking, chest tightness and shortness of breath.   Cardiovascular: Negative for chest pain, palpitations and leg swelling.  Gastrointestinal: Negative for abdominal pain, constipation, diarrhea, nausea and vomiting.  Genitourinary: Negative for decreased urine volume, difficulty urinating and hematuria.  Musculoskeletal: Positive for arthralgias (L hip), back pain and gait problem. Negative for joint swelling, myalgias, neck pain and neck stiffness.  Skin: Negative for rash and wound.  Neurological: Negative for dizziness, seizures, syncope, weakness, light-headedness, numbness and headaches.  Hematological: Does not bruise/bleed  easily.  Psychiatric/Behavioral: Negative.      Physical Exam Updated Vital Signs BP 113/68 (BP Location: Left Arm)   Pulse 89   Temp 97.6 F (36.4 C) (Oral)   Resp 18   Ht 5\' 2"  (1.575 m)   Wt 90.7 kg   SpO2 98%   BMI 36.58 kg/m   Physical Exam  Constitutional: She is oriented to person, place, and time. She appears well-developed and well-nourished. No distress.  Pt found sitting in chair with body weight tilted to the R, states this is the only way she can sit down and avoid L hip pain.  HENT:  Head: Normocephalic and atraumatic.  Nose: Nose normal.  Mouth/Throat: Oropharynx is clear and moist. No oropharyngeal exudate.  Eyes: Conjunctivae and EOM are normal. Pupils are equal, round, and reactive  to light.  Neck: Normal range of motion. Neck supple. No JVD present.  Cardiovascular: Normal rate, regular rhythm, normal heart sounds and intact distal pulses.   No murmur heard. Pulmonary/Chest: Effort normal and breath sounds normal. No respiratory distress. She has no wheezes. She has no rales. She exhibits no tenderness.  Abdominal: Soft. Bowel sounds are normal. She exhibits no distension and no mass. There is no tenderness. There is no rebound and no guarding.  Musculoskeletal: Normal range of motion. She exhibits no deformity.  No obvious deformity, warmth, edema or signs of trauma in upper and lower extremities.  Pt has antalgic slow gait favoring L side.   No foot drop. No midline spinal tenderness. Tenderness at L sciatic notch. Decreased spine flexion, extension, lateral bend and rotation due to reported pain.  Full ROM in lower extremities with  + SLR on left side with radiation of LBP to L buttock - Faber test bilaterally  Lymphadenopathy:    She has no cervical adenopathy.  Neurological: She is alert and oriented to person, place, and time. No sensory deficit.  Sensation to light touch intact in bilateral lower extremities. 5/5 strength in bilateral lower  extremities.  No groin numbness.  Intact knee reflexes.  Skin: Skin is warm and dry. Capillary refill takes less than 2 seconds.  Psychiatric: She has a normal mood and affect. Her behavior is normal. Judgment and thought content normal.  Nursing note and vitals reviewed.    ED Treatments / Results  Labs (all labs ordered are listed, but only abnormal results are displayed) Labs Reviewed - No data to display  EKG  EKG Interpretation None       Radiology Ct Lumbar Spine Wo Contrast  Result Date: 09/26/2016 CLINICAL DATA:  Initial evaluation for new onset back pain with risk factors for compression fracture. No injury. EXAM: CT LUMBAR SPINE WITHOUT CONTRAST TECHNIQUE: Multidetector CT imaging of the lumbar spine was performed without intravenous contrast administration. Multiplanar CT image reconstructions were also generated. COMPARISON:  None available. FINDINGS: Segmentation: Normal segmentation. Lowest well-formed disc is labeled the L5-S1 level. Alignment: 3 mm anterolisthesis of L5 on S1 without frank pars defect. Vertebral bodies otherwise normally aligned with preservation of the normal lumbar lordosis. Vertebrae: Vertebral body heights are preserved. No acute fracture. SI joints approximated. Visualized sacrum intact. Paraspinal and other soft tissues: Paraspinous soft tissues demonstrate no acute abnormality. Patient status post cholecystectomy. Mild scattered plaque within the infrarenal aorta. Disc levels: T12-L1: Central disc extrusion with peripheral calcification indents the ventral thecal sac without significant stenosis. L1-2: Broad posterior disc bulge mildly flattens the ventral thecal sac without significant canal stenosis. Foramina remain patent. L2-3: Mild intervertebral disc space narrowing. Diffuse disc bulge. Superimposed left foraminal/extraforaminal disc protrusion, closely approximating the exiting left L2 nerve root (series 4, image 36). Superimposed mild facet  arthrosis. No significant canal narrowing. Foramina remain patent. L3-4: Mild intervertebral disc space narrowing with diffuse disc bulge. Facet and ligamentum flavum hypertrophy. Mild canal and probable lateral recess stenosis. Suspected mild bilateral foraminal narrowing related to disc bulge and facet disease. L4-5: Diffuse degenerative disc bulge, eccentric to the left. Associated disc desiccation. Gas lucency posterior to the L5 vertebral body likely from the L4-5 parent disc. Bilateral facet arthrosis. No significant canal or foraminal stenosis. L5-S1: 3 mm anterolisthesis of L5 on S1 without frank pars defect. Severe bilateral facet arthropathy. Associated diffuse disc bulge. No significant canal stenosis. Mild to moderate bilateral foraminal narrowing related to disc bulge, endplate osteophytic  spurring, and facet disease. IMPRESSION: 1. No acute abnormality within the lumbar spine. 2. Left foraminal/far lateral disc protrusion at L2-3, closely approximating and potentially irritating the exiting left L2 nerve root. 3. 3 mm anterolisthesis of L5 on S1 with a advanced bilateral facet arthropathy. Mild-to-moderate bilateral foraminal stenosis at this level. 4. Central disc extrusion at T12-L1 without significant stenosis. Electronically Signed   By: Jeannine Boga M.D.   On: 09/26/2016 16:56    Procedures Procedures (including critical care time)  Medications Ordered in ED Medications  acetaminophen (TYLENOL) tablet 1,000 mg (1,000 mg Oral Given 09/26/16 1626)  dexamethasone (DECADRON) injection 10 mg (10 mg Intramuscular Given 09/26/16 1626)     Initial Impression / Assessment and Plan / ED Course  I have reviewed the triage vital signs and the nursing notes.  Pertinent labs & imaging results that were available during my care of the patient were reviewed by me and considered in my medical decision making (see chart for details).  Clinical Course    Physical exam remarkable for antalgic  slow gait favoring the left side, tenderness at left sciatic loss, decreased spine range of motion and positive left straight leg raise test. Given history of breast cancer, age, occupation I considered patient was high risk for compression fracture and less likely metastases to spine. CT scan remarkable for lateral disc protrusion at L2-3 potentially irritating the left L2 nerve root and central disc extrusion at T12-L1 without significant stenosis. No signs of epidural abscess or cauda equina. I discussed CT scan results with the patient, given report. Patient was given Tylenol and Decadron injection in the ED. Patient's will be discharged with analgesics and close orthopedic follow-up. Strict ED return instructions given, patient verbalized understanding and is agreeable to discharge plan.   Final Clinical Impressions(s) / ED Diagnoses   Final diagnoses:  Left hip pain  Disc disorder of lumbar region    New Prescriptions New Prescriptions   HYDROCODONE-ACETAMINOPHEN (NORCO/VICODIN) 5-325 MG TABLET    Take 2 tablets by mouth every 4 (four) hours as needed.   NAPROXEN (NAPROSYN) 500 MG TABLET    Take 1 tablet (500 mg total) by mouth 2 (two) times daily.   PREDNISONE (DELTASONE) 10 MG TABLET    Take 2 tablets (20 mg total) by mouth daily.     Kinnie Feil, PA-C 09/26/16 [place], MD 09/27/16 [phone]

## 2016-09-26 NOTE — ED Triage Notes (Signed)
Patient complains of left hip pain that radiates down to left knee. States history of sciatic pain. Denies fall or injury.

## 2016-09-26 NOTE — Discharge Instructions (Signed)
The CT scan done today showed some low back disc abnormalities. Please see attached report. As discussed is important that you make an appointment with orthopedist and establish care for further discussion of her CT scan findings.  You have been prescribed a short course of Vicodin and naproxen for the pain, and prednisone for inflammation. Please take these medications as prescribed  Return to the emergency department if you develop lower extremity weakness, numbness, tingling and/or bladder incontinence or retention.

## 2016-09-27 MED FILL — NAPROXEN 500 MG TABLET: 500 | 15 days supply | Qty: 30 | Fill #0

## 2016-09-27 MED FILL — predniSONE 10 MG TABS: 10 | 5 days supply | Qty: 10 | Fill #0

## 2016-09-27 MED FILL — HYDROCODON-APAP 5-325: 5-325 | 2 days supply | Qty: 15 | Fill #0

## 2016-09-28 ENCOUNTER — Other Ambulatory Visit: Payer: Self-pay | Admitting: Family Medicine

## 2016-09-28 MED FILL — IBUPROFEN 800 MG TABLET: 800 | 30 days supply | Qty: 30 | Fill #5

## 2016-09-29 MED FILL — HYDROCHLOROTHIAZIDE 25 MG T: 25 | 90 days supply | Qty: 90 | Fill #0

## 2016-10-31 MED FILL — IBUPROFEN 800 MG TABLET: 800 | 30 days supply | Qty: 30 | Fill #6

## 2016-10-31 MED FILL — AMLODIPINE BESYLATE 2.5 MG: 2.5 | 90 days supply | Qty: 90 | Fill #3

## 2016-10-31 MED FILL — KLOR-CON M20 TABLET: 20 | 90 days supply | Qty: 90 | Fill #3

## 2016-12-19 ENCOUNTER — Encounter (HOSPITAL_COMMUNITY): Payer: Self-pay | Admitting: *Deleted

## 2016-12-19 ENCOUNTER — Ambulatory Visit (HOSPITAL_COMMUNITY)
Admission: EM | Admit: 2016-12-19 | Discharge: 2016-12-19 | Disposition: A | Discharge status: Home / Self Care | Payer: 59 | Attending: Family Medicine | Admitting: Family Medicine

## 2016-12-19 DIAGNOSIS — G43009 Migraine without aura, not intractable, without status migrainosus: Secondary | ICD-10-CM

## 2016-12-19 DIAGNOSIS — R112 Nausea with vomiting, unspecified: Secondary | ICD-10-CM | POA: Diagnosis not present

## 2016-12-19 MED ORDER — ONDANSETRON 4 MG PO TBDP: 4.0000 mg | ORAL_TABLET | Freq: Three times a day (TID) | ORAL | 0 refills | Status: DC | PRN

## 2016-12-19 MED ORDER — KETOROLAC TROMETHAMINE 60 MG/2ML IM SOLN
INTRAMUSCULAR | Status: AC
  Filled 2016-12-19: qty 2

## 2016-12-19 MED ORDER — KETOROLAC TROMETHAMINE 60 MG/2ML IM SOLN
60.0000 mg | Freq: Once | INTRAMUSCULAR | Status: AC
  Administered 2016-12-19: 60 mg via INTRAMUSCULAR

## 2016-12-19 MED ORDER — BUTALBITAL-APAP-CAFFEINE 50-325-40 MG PO TABS: 1.0000 | ORAL_TABLET | Freq: Four times a day (QID) | ORAL | 0 refills | Status: DC | PRN

## 2016-12-19 MED FILL — BUTALBITAL/APAP/CAFFEINE TB: 50-325-40 | 5 days supply | Qty: 20 | Fill #0

## 2016-12-19 MED FILL — ONDANSETRON ODT 4 MG TABLET: 4 | 7 days supply | Qty: 20 | Fill #0

## 2016-12-19 NOTE — ED Provider Notes (Signed)
CSN: [id number]     Arrival date & time 12/19/16  1321 History   First MD Initiated Contact with Patient 12/19/16 1516     Chief Complaint  Patient presents with  . Headache   (Consider location/radiation/quality/duration/timing/severity/associated sxs/prior Treatment) HPI: 56 y/o female with H/O Migraine presented with CC of right sided headache with N/V x 5 days. Pt reports headache is persistent, feels the same as previous migraine headache attack. Not the worse headache of life. Occasional N/V. Denies change in vision or blurry vision. Denies photophobia or phonophobia.Reports see Neurologist and gets Nerve block inj for Migraine headache. A&Ox3, speech clear, Gait steady. Denies N/V currently.  Past Medical History:  Diagnosis Date  . Adenocarcinoma of breast ([place])    left   . Heel spur   . Hyperlipidemia   . Hypertension   . Migraines   . Obesity    Past Surgical History:  Procedure Laterality Date  . btl  1992  . CHOLECYSTECTOMY    . COLONOSCOPY  09/15/2011   Procedure: COLONOSCOPY;  Surgeon: Dorothyann Peng, MD;  Location: [facility];  Service: Endoscopy;  Laterality: N/A;  9:15 AM  . left lumpectomy and lymph node disection  2000  . r heel surgery for spur and chipped bone  11/09/20099  . TUBAL LIGATION     Family History  Problem Relation Age of Onset  . Heart attack Mother   . Heart attack Father   . Glaucoma Father   . Hypertension Father   . Diabetes Sister   . Anesthesia problems Neg Hx   . Hypotension Neg Hx   . Malignant hyperthermia Neg Hx   . Pseudochol deficiency Neg Hx   . Migraines Neg Hx    Social History  Substance Use Topics  . Smoking status: Current Every Day Smoker    Packs/day: 0.25    Years: 15.00    Types: Cigarettes  . Smokeless tobacco: Never Used     Comment: smoking 3 per day  . Alcohol use No   OB History    Gravida Para Term Preterm AB Living   2 2 2     2    SAB TAB Ectopic Multiple Live Births                 Review of  Systems  Constitutional: Negative for chills and fever.  Eyes: Negative for photophobia, discharge, redness and visual disturbance.  Respiratory: Negative.   Cardiovascular: Negative.   Neurological: Positive for headaches. Negative for dizziness, tremors, syncope, facial asymmetry, speech difficulty, weakness, light-headedness and numbness.    Allergies  Ace inhibitors  Home Medications   Prior to Admission medications   Medication Sig Start Date End Date Taking? Authorizing Provider  amLODipine (NORVASC) 2.5 MG tablet Take 1 tablet (2.5 mg total) by mouth daily. 02/10/16   Fayrene Helper, MD  Biotin 1000 MCG tablet Take 1,000 mcg by mouth daily.     Historical Provider, MD  butalbital-acetaminophen-caffeine (FIORICET, ESGIC) [id number] MG tablet Take 1 tablet by mouth every 6 (six) hours as needed for headache. 12/19/16 12/19/17  Breven Guidroz, NP  fluticasone (FLONASE) 50 MCG/ACT nasal spray USE 2 SPRAYS IN EACH NOSTRIL ONCE DAILY Patient taking differently: USE 2 SPRAYS IN EACH NOSTRIL ONCE DAILY AS NEEDED FOR ALLERGIES. 02/11/16   Fayrene Helper, MD  hydrochlorothiazide (HYDRODIURIL) 25 MG tablet TAKE 1 TABLET BY MOUTH DAILY 09/29/16   Fayrene Helper, MD  HYDROcodone-acetaminophen (NORCO/VICODIN) 5-325 MG tablet Take 2 tablets  by mouth every 4 (four) hours as needed. 09/26/16   Kinnie Feil, PA-C  ibuprofen (ADVIL,MOTRIN) 800 MG tablet TAKE 1 TABLET BY MOUTH ONCE DAILY AS NEEDED Patient taking differently: TAKE 1 TABLET BY MOUTH ONCE DAILY AS NEEDED FOR PAIN. 12/23/15   Fayrene Helper, MD  Loratadine 10 MG CAPS Take 1 capsule (10 mg total) by mouth daily. 01/03/16   Fayrene Helper, MD  naproxen (NAPROSYN) 500 MG tablet Take 1 tablet (500 mg total) by mouth 2 (two) times daily. 09/26/16   Kinnie Feil, PA-C  ondansetron (ZOFRAN ODT) 4 MG disintegrating tablet Take 1 tablet (4 mg total) by mouth every 8 (eight) hours as needed for nausea or vomiting. 12/19/16    Arlie Riker, NP  potassium chloride SA (K-DUR,KLOR-CON) 20 MEQ tablet TAKE 1 TABLET BY MOUTH ONCE DAILY 02/03/16   Fayrene Helper, MD  Vitamin D, Ergocalciferol, (DRISDOL) 50000 units CAPS capsule Take 1 capsule (50,000 Units total) by mouth every 7 (seven) days. 05/19/16   Fayrene Helper, MD   Meds Ordered and Administered this Visit   Medications  ketorolac (TORADOL) injection 60 mg (not administered)    BP 116/85 (BP Location: Right Arm)   Pulse 76   Temp 98.7 F (37.1 C) (Oral)   Resp 18   SpO2 98%  No data found.   Physical Exam  Constitutional: She is oriented to person, place, and time. She appears well-developed and well-nourished. No distress.  HENT:  Head: Normocephalic.  Eyes: Pupils are equal, round, and reactive to light.  Neck: Neck supple.  Cardiovascular: Normal rate, regular rhythm and normal heart sounds.   Pulmonary/Chest: Effort normal and breath sounds normal.  Neurological: She is alert and oriented to person, place, and time. She has normal strength. No cranial nerve deficit or sensory deficit. Coordination normal.  Skin: Skin is warm.  Psychiatric: She has a normal mood and affect.  Right sided headache x 5 days. Sx similar to previous migraine headache. Neurologically Intact. Not the worse headache of life.  Urgent Care Course     Procedures (including critical care time)  Labs Review Labs Reviewed - No data to display  Imaging Review No results found.   Visual Acuity Review  Right Eye Distance:   Left Eye Distance:   Bilateral Distance:    Right Eye Near:   Left Eye Near:    Bilateral Near:         MDM   1. Migraine without aura and without status migrainosus, not intractable   2. Nausea and vomiting, intractability of vomiting not specified, unspecified vomiting type   Recommended to FU with Neurology for further management for migraine headaches. If Sx worsens advised to go to ED.    Ismeal Heider,  NP 12/19/16 1528

## 2016-12-19 NOTE — ED Triage Notes (Signed)
Headache   X  5  Days  Off  And  On       Pt  Has  A  History  Of  Migraines   No  Injury  Noted     Vomiting  And  Diarrhea  As  well

## 2016-12-29 MED FILL — HYDROCHLOROTHIAZIDE 25 MG T: 25 | 90 days supply | Qty: 90 | Fill #1

## 2017-01-01 ENCOUNTER — Encounter: Payer: Self-pay | Admitting: Family Medicine

## 2017-01-01 ENCOUNTER — Ambulatory Visit (INDEPENDENT_AMBULATORY_CARE_PROVIDER_SITE_OTHER): Payer: 59 | Admitting: Family Medicine

## 2017-01-01 VITALS — BP 144/94 | HR 84 | Temp 96.5°F | Resp 16 | Ht 62.0 in | Wt 201.0 lb

## 2017-01-01 DIAGNOSIS — R7303 Prediabetes: Secondary | ICD-10-CM

## 2017-01-01 DIAGNOSIS — E7849 Other hyperlipidemia: Secondary | ICD-10-CM

## 2017-01-01 DIAGNOSIS — E785 Hyperlipidemia, unspecified: Secondary | ICD-10-CM | POA: Diagnosis not present

## 2017-01-01 DIAGNOSIS — I10 Essential (primary) hypertension: Secondary | ICD-10-CM

## 2017-01-01 DIAGNOSIS — E8881 Metabolic syndrome: Secondary | ICD-10-CM | POA: Diagnosis not present

## 2017-01-01 DIAGNOSIS — IMO0001 Reserved for inherently not codable concepts without codable children: Secondary | ICD-10-CM

## 2017-01-01 DIAGNOSIS — E784 Other hyperlipidemia: Secondary | ICD-10-CM | POA: Diagnosis not present

## 2017-01-01 DIAGNOSIS — J302 Other seasonal allergic rhinitis: Secondary | ICD-10-CM

## 2017-01-01 DIAGNOSIS — E669 Obesity, unspecified: Secondary | ICD-10-CM

## 2017-01-01 DIAGNOSIS — R102 Pelvic and perineal pain: Secondary | ICD-10-CM | POA: Diagnosis not present

## 2017-01-01 LAB — BASIC METABOLIC PANEL
BUN: 11 mg/dL (ref 7–25)
CHLORIDE: 104 mmol/L (ref 98–110)
CO2: 26 mmol/L (ref 20–31)
Calcium: 9.5 mg/dL (ref 8.6–10.4)
Creat: 0.58 mg/dL (ref 0.50–1.05)
GLUCOSE: 75 mg/dL (ref 65–99)
POTASSIUM: 3.7 mmol/L (ref 3.5–5.3)
SODIUM: 141 mmol/L (ref 135–146)

## 2017-01-01 LAB — LIPID PANEL
CHOLESTEROL: 203 mg/dL — AB (ref <200)
HDL: 60 mg/dL (ref >50)
LDL Cholesterol: 123 mg/dL — ABNORMAL HIGH (ref <100)
Total CHOL/HDL Ratio: 3.4 Ratio (ref <5.0)
Triglycerides: 100 mg/dL (ref <150)
VLDL: 20 mg/dL (ref <30)

## 2017-01-01 NOTE — Patient Instructions (Addendum)
Physical exam August 25 or after, call if you need me before  Blood pressure slightly high, you need to change diet and lose weight, no med change  you are referred to Dr Garwin Brothers re new pelvic pain and also for pelvic ultrasound  For new fatigue , labs today CBC, lipid, cmp and eGFr, hBA1C , TSH and vit D   DASH Eating Plan DASH stands for "Dietary Approaches to Stop Hypertension." The DASH eating plan is a healthy eating plan that has been shown to reduce high blood pressure (hypertension). It may also reduce your risk for type 2 diabetes, heart disease, and stroke. The DASH eating plan may also help with weight loss. What are tips for following this plan? General guidelines   Avoid eating more than 2,300 mg (milligrams) of salt (sodium) a day. If you have hypertension, you may need to reduce your sodium intake to 1,500 mg a day.  Limit alcohol intake to no more than 1 drink a day for nonpregnant women and 2 drinks a day for men. One drink equals 12 oz of beer, 5 oz of wine, or 1 oz of hard liquor.  Work with your health care provider to maintain a healthy body weight or to lose weight. Ask what an ideal weight is for you.  Get at least 30 minutes of exercise that causes your heart to beat faster (aerobic exercise) most days of the week. Activities may include walking, swimming, or biking.  Work with your health care provider or diet and nutrition specialist (dietitian) to adjust your eating plan to your individual calorie needs. Reading food labels   Check food labels for the amount of sodium per serving. Choose foods with less than 5 percent of the Daily Value of sodium. Generally, foods with less than 300 mg of sodium per serving fit into this eating plan.  To find whole grains, look for the word "whole" as the first word in the ingredient list. Shopping   Buy products labeled as "low-sodium" or "no salt added."  Buy fresh foods. Avoid canned foods and premade or frozen  meals. Cooking   Avoid adding salt when cooking. Use salt-free seasonings or herbs instead of table salt or sea salt. Check with your health care provider or pharmacist before using salt substitutes.  Do not fry foods. Cook foods using healthy methods such as baking, boiling, grilling, and broiling instead.  Cook with heart-healthy oils, such as olive, canola, soybean, or sunflower oil. Meal planning    Eat a balanced diet that includes:  5 or more servings of fruits and vegetables each day. At each meal, try to fill half of your plate with fruits and vegetables.  Up to 6-8 servings of whole grains each day.  Less than 6 oz of lean meat, poultry, or fish each day. A 3-oz serving of meat is about the same size as a deck of cards. One egg equals 1 oz.  2 servings of low-fat dairy each day.  A serving of nuts, seeds, or beans 5 times each week.  Heart-healthy fats. Healthy fats called Omega-3 fatty acids are found in foods such as flaxseeds and coldwater fish, like sardines, salmon, and mackerel.  Limit how much you eat of the following:  Canned or prepackaged foods.  Food that is high in trans fat, such as fried foods.  Food that is high in saturated fat, such as fatty meat.  Sweets, desserts, sugary drinks, and other foods with added sugar.  Full-fat dairy products.  Do not salt foods before eating.  Try to eat at least 2 vegetarian meals each week.  Eat more home-cooked food and less restaurant, buffet, and fast food.  When eating at a restaurant, ask that your food be prepared with less salt or no salt, if possible. What foods are recommended? The items listed may not be a complete list. Talk with your dietitian about what dietary choices are best for you. Grains  Whole-grain or whole-wheat bread. Whole-grain or whole-wheat pasta. Brown rice. [place]row. Bulgur. Whole-grain and low-sodium cereals. Pita bread. Low-fat, low-sodium crackers. Whole-wheat flour  tortillas. Vegetables  Fresh or frozen vegetables (raw, steamed, roasted, or grilled). Low-sodium or reduced-sodium tomato and vegetable juice. Low-sodium or reduced-sodium tomato sauce and tomato paste. Low-sodium or reduced-sodium canned vegetables. Fruits  All fresh, dried, or frozen fruit. Canned fruit in natural juice (without added sugar). Meat and other protein foods  Skinless chicken or [place]. Ground chicken or [place]. Pork with fat trimmed off. Fish and seafood. Egg whites. Dried beans, peas, or lentils. Unsalted nuts, nut butters, and seeds. Unsalted canned beans. Lean cuts of beef with fat trimmed off. Low-sodium, lean deli meat. Dairy  Low-fat (1%) or fat-free (skim) milk. Fat-free, low-fat, or reduced-fat cheeses. Nonfat, low-sodium ricotta or cottage cheese. Low-fat or nonfat yogurt. Low-fat, low-sodium cheese. Fats and oils  Soft margarine without trans fats. Vegetable oil. Low-fat, reduced-fat, or light mayonnaise and salad dressings (reduced-sodium). Canola, safflower, olive, soybean, and sunflower oils. Avocado. Seasoning and other foods  Herbs. Spices. Seasoning mixes without salt. Unsalted popcorn and pretzels. Fat-free sweets. What foods are not recommended? The items listed may not be a complete list. Talk with your dietitian about what dietary choices are best for you. Grains  Baked goods made with fat, such as croissants, muffins, or some breads. Dry pasta or rice meal packs. Vegetables  Creamed or fried vegetables. Vegetables in a cheese sauce. Regular canned vegetables (not low-sodium or reduced-sodium). Regular canned tomato sauce and paste (not low-sodium or reduced-sodium). Regular tomato and vegetable juice (not low-sodium or reduced-sodium). Angie Fava. Olives. Fruits  Canned fruit in a light or heavy syrup. Fried fruit. Fruit in cream or butter sauce. Meat and other protein foods  Fatty cuts of meat. Ribs. Fried meat. Berniece Salines. Sausage. Bologna and other processed  lunch meats. Salami. Fatback. Hotdogs. Bratwurst. Salted nuts and seeds. Canned beans with added salt. Canned or smoked fish. Whole eggs or egg yolks. Chicken or [place] with skin. Dairy  Whole or 2% milk, cream, and half-and-half. Whole or full-fat cream cheese. Whole-fat or sweetened yogurt. Full-fat cheese. Nondairy creamers. Whipped toppings. Processed cheese and cheese spreads. Fats and oils  Butter. Stick margarine. Lard. Shortening. Ghee. Bacon fat. Tropical oils, such as coconut, palm kernel, or palm oil. Seasoning and other foods  Salted popcorn and pretzels. Onion salt, garlic salt, seasoned salt, table salt, and sea salt. Worcestershire sauce. Tartar sauce. Barbecue sauce. Teriyaki sauce. Soy sauce, including reduced-sodium. Steak sauce. Canned and packaged gravies. Fish sauce. Oyster sauce. Cocktail sauce. Horseradish that you find on the shelf. Ketchup. Mustard. Meat flavorings and tenderizers. Bouillon cubes. Hot sauce and Tabasco sauce. Premade or packaged marinades. Premade or packaged taco seasonings. Relishes. Regular salad dressings. Where to find more information:  National Heart, Lung, and [place]: https://wilson-eaton.com/  American Heart Association: www.heart.org Summary  The DASH eating plan is a healthy eating plan that has been shown to reduce high blood pressure (hypertension). It may also reduce your risk for type 2 diabetes, heart  disease, and stroke.  With the DASH eating plan, you should limit salt (sodium) intake to 2,300 mg a day. If you have hypertension, you may need to reduce your sodium intake to 1,500 mg a day.  When on the DASH eating plan, aim to eat more fresh fruits and vegetables, whole grains, lean proteins, low-fat dairy, and heart-healthy fats.  Work with your health care provider or diet and nutrition specialist (dietitian) to adjust your eating plan to your individual calorie needs. This information is not intended to replace advice given to you by  your health care provider. Make sure you discuss any questions you have with your health care provider. Document Released: 08/31/2011 Document Revised: 09/04/2016 Document Reviewed: 09/04/2016 Elsevier Interactive Patient Education  2017 [employer].

## 2017-01-01 NOTE — Assessment & Plan Note (Addendum)
Two  week h/o bilateral pelvic pain , awakened pt last night, short duration, refer to gyne, and obtain pelvic US

## 2017-01-01 NOTE — Progress Notes (Signed)
[name] [name]     MRN: [id number]      DOB: 1961-09-06   HPI [name] [name] is here for follow up and re-evaluation of chronic medical conditions, medication management and review of any available recent lab and radiology data.  Preventive health is updated, specifically  Cancer screening and Immunization.   Questions or concerns regarding consultations or procedures which the PT has had in the interim are  addressed. The PT denies any adverse reactions to current medications since the last visit.  New bilateral pelvic pain x 1 week, awakened pt last night, duration less than 20 mins Fatigue over 2 weeks, denies snoring,or excessive daytime sleeepiness ROS Denies recent fever or chills. Denies sinus pressure, nasal congestion, ear pain or sore throat. Denies chest congestion, productive cough or wheezing. Denies chest pains, palpitations and leg swelling Denies , nausea, vomiting,diarrhea or constipation.   Denies dysuria, frequency, hesitancy or incontinence. Denies joint pain, swelling and limitation in mobility. Denies headaches, seizures, numbness, or tingling. Denies depression, anxiety or insomnia. Denies skin break down or rash.   PE  BP (!) 144/94 (BP Location: Right Arm, Patient Position: Sitting, Cuff Size: Normal)   Pulse 84   Temp (!) 96.5 F (35.8 C) (Temporal)   Resp 16   Ht 5\' 2"  (1.575 m)   Wt 201 lb (91.2 kg)   SpO2 97%   BMI 36.76 kg/m   Patient alert and oriented and in no cardiopulmonary distress.  HEENT: No facial asymmetry, EOMI,   oropharynx pink and moist.  Neck supple no JVD, no mass.  Chest: Clear to auscultation bilaterally.  CVS: S1, S2 no murmurs, no S3.Regular rate.  ABD: Soft , bilateral lower quadrant tenderness , no guarding or rebound, normal BS  Ext: No edema  MS: Adequate ROM spine, shoulders, hips and knees.  Skin: Intact, no ulcerations or rash noted.  Psych: Good eye contact, normal affect. Memory intact not anxious or  depressed appearing.  CNS: CN 2-12 intact, power,  normal throughout.no focal deficits noted.   Assessment & Plan  Pelvic pain in female Two  week h/o bilateral pelvic pain , awakened pt last night, short duration, refer to gyne, and obtain pelvic US  Essential hypertension Uncontreolled, no med change DASH diet and commitment to daily physical activity for a minimum of 30 minutes discussed and encouraged, as a part of hypertension management. The importance of attaining a healthy weight is also discussed.  BP/Weight 01/01/2017 12/19/2016 09/26/2016 09/04/2016 05/18/2016 03/20/2016 05/03/9832  Systolic BP 825 053 976 734 193 790 240  Diastolic BP 94 85 68 88 88 82 74  Wt. (Lbs) 201 - 200 203 199.12 203.8 198  BMI 36.76 - 36.58 37.13 36.42 37.27 36.21       Allergic rhinitis Increased symptoms with season change , encouraged to use medication regularly  Prediabetes Patient educated about the importance of limiting  Carbohydrate intake , the need to commit to daily physical activity for a minimum of 30 minutes , and to commit weight loss. The fact that changes in all these areas will reduce or eliminate all together the development of diabetes is stressed.  Deteriorated  Diabetic Labs Latest Ref Rng & Units 01/01/2017 05/18/2016 02/28/2016 12/08/2015 05/17/2015  HbA1c <5.7 % 5.7(H) 5.5 - 6.2(H) 6.1(H)  Chol <200 mg/dL 203(H) - - 179 213(H)  HDL >50 mg/dL 60 - - 56 65  Calc LDL <100 mg/dL 123(H) - - 106 135(H)  Triglycerides <150 mg/dL 100 - - 85  63  Creatinine 0.50 - 1.05 mg/dL 0.58 0.65 0.60 0.63 0.63   BP/Weight 01/01/2017 12/19/2016 09/26/2016 09/04/2016 05/18/2016 03/20/2016 1/47/0929  Systolic BP 574 734 037 096 438 381 840  Diastolic BP 94 85 68 88 88 82 74  Wt. (Lbs) 201 - 200 203 199.12 203.8 198  BMI 36.76 - 36.58 37.13 36.42 37.27 36.21   No flowsheet data found.    Hyperlipemia Hyperlipidemia:Low fat diet discussed and encouraged.   Lipid Panel  Lab Results  Component  Value Date   CHOL 203 (H) 01/01/2017   HDL 60 01/01/2017   LDLCALC 123 (H) 01/01/2017   TRIG 100 01/01/2017   CHOLHDL 3.4 01/01/2017   Not at goal, needs to change diet Updated lab needed at/ before next visit.     Metabolic syndrome X The increased risk of cardiovascular disease associated with this diagnosis, and the need to consistently work on lifestyle to change this is discussed. Following  a  heart healthy diet ,commitment to 30 minutes of exercise at least 5 days per week, as well as control of blood sugar and cholesterol , and achieving a healthy weight are all the areas to be addressed .   Obesity, Class II, BMI 35-39.9, with comorbidity Deteriorated. Patient re-educated about  the importance of commitment to a  minimum of 150 minutes of exercise per week.  The importance of healthy food choices with portion control discussed. Encouraged to start a food diary, count calories and to consider  joining a support group. Sample diet sheets offered. Goals set by the patient for the next several months.   Weight /BMI 01/01/2017 09/26/2016 09/04/2016  WEIGHT 201 lb 200 lb 203 lb  HEIGHT 5\' 2"  5\' 2"  5\' 2"   BMI 36.76 kg/m2 36.58 kg/m2 37.13 kg/m2

## 2017-01-02 LAB — VITAMIN D 25 HYDROXY (VIT D DEFICIENCY, FRACTURES): VIT D 25 HYDROXY: 21 ng/mL — AB (ref 30–100)

## 2017-01-02 LAB — HEMOGLOBIN A1C
HEMOGLOBIN A1C: 5.7 % — AB (ref <5.7)
Mean Plasma Glucose: 117 mg/dL

## 2017-01-06 NOTE — Assessment & Plan Note (Signed)
Increased symptoms with season change , encouraged to use medication regularly

## 2017-01-06 NOTE — Assessment & Plan Note (Signed)
Hyperlipidemia:Low fat diet discussed and encouraged.   Lipid Panel  Lab Results  Component Value Date   CHOL 203 (H) 01/01/2017   HDL 60 01/01/2017   LDLCALC 123 (H) 01/01/2017   TRIG 100 01/01/2017   CHOLHDL 3.4 01/01/2017   Not at goal, needs to change diet Updated lab needed at/ before next visit.

## 2017-01-06 NOTE — Assessment & Plan Note (Signed)
Deteriorated. Patient re-educated about  the importance of commitment to a  minimum of 150 minutes of exercise per week.  The importance of healthy food choices with portion control discussed. Encouraged to start a food diary, count calories and to consider  joining a support group. Sample diet sheets offered. Goals set by the patient for the next several months.   Weight /BMI 01/01/2017 09/26/2016 09/04/2016  WEIGHT 201 lb 200 lb 203 lb  HEIGHT 5\' 2"  5\' 2"  5\' 2"   BMI 36.76 kg/m2 36.58 kg/m2 37.13 kg/m2

## 2017-01-06 NOTE — Assessment & Plan Note (Signed)
The increased risk of cardiovascular disease associated with this diagnosis, and the need to consistently work on lifestyle to change this is discussed. Following  a  heart healthy diet ,commitment to 30 minutes of exercise at least 5 days per week, as well as control of blood sugar and cholesterol , and achieving a healthy weight are all the areas to be addressed .  

## 2017-01-06 NOTE — Assessment & Plan Note (Signed)
Patient educated about the importance of limiting  Carbohydrate intake , the need to commit to daily physical activity for a minimum of 30 minutes , and to commit weight loss. The fact that changes in all these areas will reduce or eliminate all together the development of diabetes is stressed.  Deteriorated  Diabetic Labs Latest Ref Rng & Units 01/01/2017 05/18/2016 02/28/2016 12/08/2015 05/17/2015  HbA1c <5.7 % 5.7(H) 5.5 - 6.2(H) 6.1(H)  Chol <200 mg/dL 203(H) - - 179 213(H)  HDL >50 mg/dL 60 - - 56 65  Calc LDL <100 mg/dL 123(H) - - 106 135(H)  Triglycerides <150 mg/dL 100 - - 85 63  Creatinine 0.50 - 1.05 mg/dL 0.58 0.65 0.60 0.63 0.63   BP/Weight 01/01/2017 12/19/2016 09/26/2016 09/04/2016 05/18/2016 03/20/2016 0/04/6760  Systolic BP 950 932 671 245 809 983 382  Diastolic BP 94 85 68 88 88 82 74  Wt. (Lbs) 201 - 200 203 199.12 203.8 198  BMI 36.76 - 36.58 37.13 36.42 37.27 36.21   No flowsheet data found.

## 2017-01-06 NOTE — Assessment & Plan Note (Signed)
Uncontreolled, no med change DASH diet and commitment to daily physical activity for a minimum of 30 minutes discussed and encouraged, as a part of hypertension management. The importance of attaining a healthy weight is also discussed.  BP/Weight 01/01/2017 12/19/2016 09/26/2016 09/04/2016 05/18/2016 03/20/2016 3/49/1791  Systolic BP 505 697 948 016 553 748 270  Diastolic BP 94 85 68 88 88 82 74  Wt. (Lbs) 201 - 200 203 199.12 203.8 198  BMI 36.76 - 36.58 37.13 36.42 37.27 36.21

## 2017-01-08 ENCOUNTER — Ambulatory Visit (HOSPITAL_COMMUNITY)
Admission: RE | Admit: 2017-01-08 | Discharge: 2017-01-08 | Disposition: A | Discharge status: Home / Self Care | Payer: 59 | Source: Ambulatory Visit | Attending: Family Medicine | Admitting: Family Medicine

## 2017-01-08 ENCOUNTER — Encounter: Payer: Self-pay | Admitting: Family Medicine

## 2017-01-08 DIAGNOSIS — N858 Other specified noninflammatory disorders of uterus: Secondary | ICD-10-CM | POA: Insufficient documentation

## 2017-01-08 DIAGNOSIS — R1031 Right lower quadrant pain: Secondary | ICD-10-CM | POA: Diagnosis not present

## 2017-01-08 DIAGNOSIS — R102 Pelvic and perineal pain: Secondary | ICD-10-CM | POA: Diagnosis not present

## 2017-01-08 DIAGNOSIS — R1032 Left lower quadrant pain: Secondary | ICD-10-CM | POA: Diagnosis not present

## 2017-01-09 ENCOUNTER — Telehealth: Payer: Self-pay | Admitting: Family Medicine

## 2017-01-09 NOTE — Telephone Encounter (Signed)
Patient calling to request the results of the ultra sound done yesterday @ [facility]

## 2017-01-10 NOTE — Telephone Encounter (Signed)
Left message

## 2017-01-10 NOTE — Telephone Encounter (Signed)
Normal

## 2017-01-10 NOTE — Telephone Encounter (Signed)
Can you give me the results and I will call pt back

## 2017-01-29 ENCOUNTER — Ambulatory Visit (INDEPENDENT_AMBULATORY_CARE_PROVIDER_SITE_OTHER): Payer: 59 | Admitting: Obstetrics & Gynecology

## 2017-01-29 ENCOUNTER — Encounter: Payer: Self-pay | Admitting: Obstetrics & Gynecology

## 2017-01-29 VITALS — BP 130/88 | Ht 62.0 in | Wt 199.0 lb

## 2017-01-29 DIAGNOSIS — R102 Pelvic and perineal pain: Secondary | ICD-10-CM | POA: Diagnosis not present

## 2017-01-29 NOTE — Progress Notes (Signed)
[name] [name] 1960/12/17 [id number]   History:    56 y.o.  G2P2L2 and 1 adopted child.  Married.  S/P BT/S.  Menopausal x 12 yrs, no HRT.  RP:  Bilateral Pelvic pain intermittently x end of March/early April  C/O intermittent bilateral pelvic pain, at times waking her up at night.  No triggering factor.  Sexually active with husband, no dyspareunia currently, at times tender with deep IC.  No episode of pelvic pain x 1 month now.  No PMB.  No abnormal vaginal d/c. No fever.  No pressure in vagina, no bulging.  Pelvic US 01/08/2017 was negative.  Patient lifts heavy weights at work and thinks in retrospects that pain might have been muscular.  Uses NSAIDS as needed.  No problem with BMs.  No UTI Sx, no SUI.    Past medical history,surgical history, family history and social history were all reviewed and documented in the EPIC chart.  Gynecologic History No LMP recorded. Patient is postmenopausal. Contraception: post menopausal status and tubal ligation Last Pap: 04/2016. Results were: normal Last mammogram: 2017. Results were: normal  Obstetric History OB History  Gravida Para Term Preterm AB Living  3 3 2     2   SAB TAB Ectopic Multiple Live Births               # Outcome Date GA Lbr Len/2nd Weight Sex Delivery Anes PTL Lv  3 Para           2 Term           1 Term             Obstetric Comments  Baby passed 10 min after being born.     ROS: A ROS was performed and pertinent positives and negatives are included in the history.  GENERAL: No fevers or chills. HEENT: No change in vision, no earache, sore throat or sinus congestion. NECK: No pain or stiffness. CARDIOVASCULAR: No chest pain or pressure. No palpitations. PULMONARY: No shortness of breath, cough or wheeze. GASTROINTESTINAL: No abdominal pain, nausea, vomiting or diarrhea, melena or bright red blood per rectum. GENITOURINARY: No urinary frequency, urgency, hesitancy or dysuria. MUSCULOSKELETAL: No joint or muscle pain,  no back pain, no recent trauma. DERMATOLOGIC: No rash, no itching, no lesions. ENDOCRINE: No polyuria, polydipsia, no heat or cold intolerance. No recent change in weight. HEMATOLOGICAL: No anemia or easy bruising or bleeding. NEUROLOGIC: No headache, seizures, numbness, tingling or weakness. PSYCHIATRIC: No depression, no loss of interest in normal activity or change in sleep pattern.     Exam:   BP 130/88   Ht 5\' 2"  (1.575 m)   Wt 199 lb (90.3 kg)   BMI 36.40 kg/m   Body mass index is 36.4 kg/m.  General appearance : Well developed well nourished female. No acute distress  Abdo:  Soft, NT, no distention, no mass.  Pelvic:  Bartholin, Urethra, Skene Glands: Within normal limits             Vagina: No gross lesions or discharge  Cervix: No gross lesions or discharge  Uterus  AV, normal size, shape and consistency, non-tender and mobile  Adnexa  Without masses or tenderness  Anus and perineum  normal    Pelvic US 01/08/2017:  Punctate uterine calcifications, stable. No discrete uterine fibroids. Normal appearing endometrium with endometrial line at 3 mm. Normal appearance of both ovaries, small volume, no cyst.  No adnexal masses or free fluid.   Assessment/Plan:  56 y.o.   1. Female pelvic pain Normal Gyn exam today and negative Pelvic US 01/08/2017.  No pelvic mass/no pelvic floor relaxation/no menopausal associated atrophy.  Resolved pelvic pain x 1 month.  Probably Musculo-Skeletic origin.    Counseling on above issue >50% x 30 minutes.   Princess Bruins MD, 9:17 AM 01/29/2017

## 2017-01-29 NOTE — Patient Instructions (Signed)
Female pelvic pain Normal Gyn exam today and negative Pelvic US 01/08/2017.  No pelvic mass/no pelvic floor relaxation/no menopausal associated atrophy.  Resolved pelvic pain x 1 month.  Probably Musculo-Skeletic origin.    [name] [name], it was a pleasure to meet you today!

## 2017-01-30 ENCOUNTER — Other Ambulatory Visit: Payer: Self-pay | Admitting: Family Medicine

## 2017-01-30 DIAGNOSIS — I10 Essential (primary) hypertension: Secondary | ICD-10-CM

## 2017-01-30 MED FILL — AMLODIPINE BESYLATE 2.5 MG: 2.5 | 90 days supply | Qty: 90 | Fill #0

## 2017-01-30 MED FILL — IBUPROFEN 800 MG TAB: 800 | 30 days supply | Qty: 30 | Fill #0

## 2017-01-30 MED FILL — POTASSIUM CL ER 20 MEQ TABL: 20 | 90 days supply | Qty: 90 | Fill #4

## 2017-02-23 ENCOUNTER — Other Ambulatory Visit (HOSPITAL_COMMUNITY): Payer: Self-pay | Admitting: *Deleted

## 2017-02-23 DIAGNOSIS — C50919 Malignant neoplasm of unspecified site of unspecified female breast: Secondary | ICD-10-CM

## 2017-02-26 ENCOUNTER — Encounter (HOSPITAL_COMMUNITY): Payer: Self-pay

## 2017-02-26 ENCOUNTER — Other Ambulatory Visit: Payer: Self-pay | Admitting: Family Medicine

## 2017-02-26 ENCOUNTER — Encounter (HOSPITAL_BASED_OUTPATIENT_CLINIC_OR_DEPARTMENT_OTHER): Payer: 59 | Admitting: Oncology

## 2017-02-26 ENCOUNTER — Ambulatory Visit (HOSPITAL_COMMUNITY): Payer: 59

## 2017-02-26 ENCOUNTER — Encounter (HOSPITAL_COMMUNITY): Payer: 59 | Attending: Oncology

## 2017-02-26 VITALS — BP 117/68 | HR 83 | Temp 98.3°F | Resp 18 | Wt 202.3 lb

## 2017-02-26 DIAGNOSIS — Z720 Tobacco use: Secondary | ICD-10-CM

## 2017-02-26 DIAGNOSIS — Z1231 Encounter for screening mammogram for malignant neoplasm of breast: Secondary | ICD-10-CM

## 2017-02-26 DIAGNOSIS — Z853 Personal history of malignant neoplasm of breast: Secondary | ICD-10-CM | POA: Diagnosis not present

## 2017-02-26 DIAGNOSIS — C50919 Malignant neoplasm of unspecified site of unspecified female breast: Secondary | ICD-10-CM

## 2017-02-26 LAB — CBC WITH DIFFERENTIAL/PLATELET
BASOS ABS: 0 10*3/uL (ref 0.0–0.1)
Basophils Relative: 1 %
Eosinophils Absolute: 0.3 10*3/uL (ref 0.0–0.7)
Eosinophils Relative: 3 %
HEMATOCRIT: 43.1 % (ref 36.0–46.0)
HEMOGLOBIN: 14.2 g/dL (ref 12.0–15.0)
LYMPHS PCT: 43 %
Lymphs Abs: 3.2 10*3/uL (ref 0.7–4.0)
MCH: 30.3 pg (ref 26.0–34.0)
MCHC: 32.9 g/dL (ref 30.0–36.0)
MCV: 91.9 fL (ref 78.0–100.0)
Monocytes Absolute: 0.4 10*3/uL (ref 0.1–1.0)
Monocytes Relative: 5 %
NEUTROS ABS: 3.6 10*3/uL (ref 1.7–7.7)
Neutrophils Relative %: 48 %
Platelets: 424 10*3/uL — ABNORMAL HIGH (ref 150–400)
RBC: 4.69 MIL/uL (ref 3.87–5.11)
RDW: 13.8 % (ref 11.5–15.5)
WBC: 7.5 10*3/uL (ref 4.0–10.5)

## 2017-02-26 LAB — COMPREHENSIVE METABOLIC PANEL
ALK PHOS: 63 U/L (ref 38–126)
ALT: 16 U/L (ref 14–54)
AST: 16 U/L (ref 15–41)
Albumin: 4.1 g/dL (ref 3.5–5.0)
Anion gap: 9 (ref 5–15)
BILIRUBIN TOTAL: 0.6 mg/dL (ref 0.3–1.2)
BUN: 10 mg/dL (ref 6–20)
CHLORIDE: 105 mmol/L (ref 101–111)
CO2: 27 mmol/L (ref 22–32)
CREATININE: 0.59 mg/dL (ref 0.44–1.00)
Calcium: 9.8 mg/dL (ref 8.9–10.3)
GFR calc Af Amer: >60 mL/min (ref >60)
Glucose, Bld: 93 mg/dL (ref 65–99)
Potassium: 3.7 mmol/L (ref 3.5–5.1)
Sodium: 141 mmol/L (ref 135–145)
TOTAL PROTEIN: 7.5 g/dL (ref 6.5–8.1)

## 2017-02-26 NOTE — Progress Notes (Signed)
[facility] NOTE  Patient Care Team: Fayrene Helper, MD as PCP - General  DIAGNOSIS: Malignant neoplasm of breast (female), unspecified site  Stage II left breast cancer, status post lumpectomy, axillary lymph node dissection, chemotherapy with Adriamycin and Cytoxan for 4 cycles followed by now will be in Taxotere for 4 cycles given in a dose dense fashion followed by one year of trastuzumab. Surgery was performed on 10/18/2004.    HISTORY OF PRESENTING ILLNESS:  [name] [name] 56 y.o. female is here because of breast cancer originally diagnosed in 2006. She returns for yearly follow-up.  Patient has been doing well and has no complaints today. She denies palpating any new breast masses or axillary lymphadenopathy. She denies any new health issues or medications since her last visit. She denies any chest pain, shortness breath, abdominal pain, headaches, bone pain, focal weakness. Recent infections.  MEDICAL HISTORY:  Past Medical History:  Diagnosis Date  . Adenocarcinoma of breast ([place])    left   . Heel spur   . Hyperlipidemia   . Hypertension   . Migraines   . Obesity     SURGICAL HISTORY: Past Surgical History:  Procedure Laterality Date  . btl  1992  . CHOLECYSTECTOMY    . COLONOSCOPY  09/15/2011   Procedure: COLONOSCOPY;  Surgeon: Dorothyann Peng, MD;  Location: [facility];  Service: Endoscopy;  Laterality: N/A;  9:15 AM  . left lumpectomy and lymph node disection  2000  . r heel surgery for spur and chipped bone  11/09/20099  . TUBAL LIGATION      SOCIAL HISTORY: Social History   Social History  . Marital status: Married    Spouse name: N/A  . Number of children: N/A  . Years of education: N/A   Occupational History  . Not on file.   Social History Main Topics  . Smoking status: Current Every Day Smoker    Packs/day: 0.25    Years: 15.00    Types: Cigarettes  . Smokeless tobacco: Never Used     Comment:  smoking 3 per day  . Alcohol use No  . Drug use: No     Comment: pt states "3 cigarrettes per day"  . Sexual activity: Yes    Birth control/ protection: Post-menopausal   Other Topics Concern  . Not on file   Social History Narrative  . No narrative on file    FAMILY HISTORY: Family History  Problem Relation Age of Onset  . Heart attack Mother   . Heart attack Father   . Glaucoma Father   . Hypertension Father   . Diabetes Sister   . Anesthesia problems Neg Hx   . Hypotension Neg Hx   . Malignant hyperthermia Neg Hx   . Pseudochol deficiency Neg Hx   . Migraines Neg Hx    indicated that her mother is deceased. She indicated that her father is deceased. She indicated that both of her sisters are alive. She indicated that her brother is alive. She indicated that the status of her neg hx is unknown.    She has 3 children and 1 grandchild; 26 y/o Her children are 35 and 34 year old 49  No one else in her family had breast cancer.   ALLERGIES:  is allergic to ace inhibitors.  MEDICATIONS:  Current Outpatient Prescriptions  Medication Sig Dispense Refill  . amLODipine (NORVASC) 2.5 MG tablet TAKE 1 TABLET BY MOUTH ONCE DAILY  90 tablet 1  . Biotin 1000 MCG tablet Take 1,000 mcg by mouth daily.     . butalbital-acetaminophen-caffeine (FIORICET, ESGIC) 50-325-40 MG tablet   0  . hydrochlorothiazide (HYDRODIURIL) 25 MG tablet TAKE 1 TABLET BY MOUTH DAILY 90 tablet 1  . ibuprofen (ADVIL,MOTRIN) 800 MG tablet TAKE 1 TABLET BY MOUTH ONCE DAILY AS NEEDED 30 tablet 2  . Loratadine 10 MG CAPS Take 1 capsule (10 mg total) by mouth daily. 90 each 1  . ondansetron (ZOFRAN-ODT) 4 MG disintegrating tablet   0  . potassium chloride SA (K-DUR,KLOR-CON) 20 MEQ tablet TAKE 1 TABLET BY MOUTH ONCE DAILY 90 tablet PRN   No current facility-administered medications for this visit.     Review of Systems  Constitutional: Negative for chills, fever, malaise/fatigue and weight loss.  HENT:  Negative for congestion, hearing loss, nosebleeds, sore throat and tinnitus.   Eyes: Negative for blurred vision, double vision, photophobia, pain and discharge.  Respiratory: Negative for cough, hemoptysis, sputum production, shortness of breath and wheezing.   Cardiovascular: Negative for chest pain, palpitations, orthopnea, claudication, leg swelling and PND.  Gastrointestinal: Negative for abdominal pain, blood in stool, constipation, diarrhea, heartburn, melena, nausea and vomiting.  Genitourinary: Negative for dysuria, frequency, hematuria and urgency.  Musculoskeletal: Negative for back pain, falls, joint pain and myalgias.  Skin: Negative for itching and rash.  Neurological: Negative for dizziness, tingling, tremors, sensory change, speech change, focal weakness, seizures, loss of consciousness, weakness and headaches.  Endo/Heme/Allergies: Negative for environmental allergies and polydipsia. Does not bruise/bleed easily.  Psychiatric/Behavioral: Negative for depression, memory loss, substance abuse and suicidal ideas. The patient is not nervous/anxious and does not have insomnia.    14 point ROS was done and is otherwise as detailed above or in HPI   PHYSICAL EXAMINATION: ECOG PERFORMANCE STATUS: 0 - Asymptomatic  Vitals:   02/26/17 0900  BP: 117/68  Pulse: 83  Resp: 18  Temp: 98.3 F (36.8 C)   Filed Weights   02/26/17 0900  Weight: 202 lb 4.8 oz (91.8 kg)     Physical Exam  Constitutional: She is oriented to person, place, and time and well-developed, well-nourished, and in no distress. No distress.  HENT:  Head: Normocephalic and atraumatic.  Nose: Nose normal.  Mouth/Throat: Oropharynx is clear and moist. No oropharyngeal exudate.  Eyes: Conjunctivae and EOM are normal. Pupils are equal, round, and reactive to light. Right eye exhibits no discharge. Left eye exhibits no discharge. No scleral icterus.  Neck: Normal range of motion. Neck supple. No JVD present. No  tracheal deviation present. No thyromegaly present.  Cardiovascular: Normal rate, regular rhythm and normal heart sounds.  Exam reveals no gallop and no friction rub.   No murmur heard. Pulmonary/Chest: Effort normal and breath sounds normal. No respiratory distress. She has no wheezes. She has no rales. Right breast exhibits no inverted nipple, no nipple discharge, no skin change and no tenderness. Left breast exhibits no inverted nipple, no mass, no nipple discharge, no skin change and no tenderness. Breasts are asymmetrical.  Abdominal: Soft. Bowel sounds are normal. She exhibits no distension and no mass. There is no tenderness. There is no rebound and no guarding.  Musculoskeletal: Normal range of motion. She exhibits no edema or tenderness.  Lymphadenopathy:    She has no cervical adenopathy.  Neurological: She is alert and oriented to person, place, and time. She has normal reflexes. No cranial nerve deficit. Gait normal. Coordination normal.  Skin: Skin is warm and dry.  No rash noted. No erythema. No pallor.  Psychiatric: Mood, memory, affect and judgment normal.  Nursing note and vitals reviewed.     LABORATORY DATA:  I have reviewed the data as listed Lab Results  Component Value Date   WBC 7.5 02/26/2017   HGB 14.2 02/26/2017   HCT 43.1 02/26/2017   MCV 91.9 02/26/2017   PLT 424 (H) 02/26/2017   CMP     Component Value Date/Time   NA 141 02/26/2017 0837   K 3.7 02/26/2017 0837   CL 105 02/26/2017 0837   CO2 27 02/26/2017 0837   GLUCOSE 93 02/26/2017 0837   BUN 10 02/26/2017 0837   CREATININE 0.59 02/26/2017 0837   CREATININE 0.58 01/01/2017 1129   CALCIUM 9.8 02/26/2017 0837   PROT 7.5 02/26/2017 0837   ALBUMIN 4.1 02/26/2017 0837   AST 16 02/26/2017 0837   ALT 16 02/26/2017 0837   ALKPHOS 63 02/26/2017 0837   BILITOT 0.6 02/26/2017 0837   GFRNONAA >60 02/26/2017 0837   GFRNONAA >89 04/08/2012 0733   GFRAA >60 02/26/2017 0837   GFRAA >89 04/08/2012 0733      RADIOGRAPHIC STUDIES: I have personally reviewed the radiological images as listed and agreed with the findings in the report. CLINICAL DATA: Screening. History of left lumpectomy with chemotherapy and radiation treatment in 2006 and 2007.  EXAM: DIGITAL SCREENING BILATERAL MAMMOGRAM WITH CAD  COMPARISON: Previous exam(s).  ACR Breast Density Category b: There are scattered areas of fibroglandular density.  FINDINGS: There are no findings suspicious for malignancy. Post operative changes are seen in the leftbreast. Images were processed with CAD.  IMPRESSION: No mammographic evidence of malignancy. A result letter of this screening mammogram will be mailed directly to the patient.  RECOMMENDATION: Screening mammogram in one year. (Code:SM-B-01Y)  BI-RADS CATEGORY 1: Negative.   Electronically Signed  By: Shon Hale M.D.  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 Repeat bilateral screening mammogram in August 2018. Labs from today reviewed and were normal. Return to clinic in 1 year for follow-up, sooner should she palpate any new breast masses or axillary lymphadenopathy.   Orders Placed This Encounter  Procedures  . MM Digital Screening    Standing Status:   Future    Standing Expiration Date:   02/26/2018    Order Specific Question:   Reason for Exam (SYMPTOM  OR DIAGNOSIS REQUIRED)    Answer:   screening mammo    Order Specific Question:   Is the patient pregnant?    Answer:   No    Order Specific Question:   Preferred imaging location?    Answer:   [facility]    All questions were answered. The patient knows to call the clinic with any problems, questions or concerns.  This note was electronically signed.    This document serves as a record of services personally performed by Ancil Linsey, MD. It was  created on her behalf by Pearlie Oyster, a trained medical scribe. The creation of this record is based on the scribe's personal observations and the provider's statements to them. This document has been checked and approved by the attending provider.     [name] Fam. [place] MD

## 2017-02-26 NOTE — Patient Instructions (Addendum)
[facility] at [facility] Discharge Instructions  RECOMMENDATIONS MADE BY THE CONSULTANT AND ANY TEST RESULTS WILL BE SENT TO YOUR REFERRING PHYSICIAN.  You were seen today by Dr. Talbert Cage. Your mammogram is due in August. Return in 1 year for follow up.    Thank you for choosing [place] at [facility] to provide your oncology and hematology care.  To afford each patient quality time with our provider, please arrive at least 15 minutes before your scheduled appointment time.    If you have a lab appointment with the [facility] please come in thru the  Main Entrance and check in at the main information desk  You need to re-schedule your appointment should you arrive 10 or more minutes late.  We strive to give you quality time with our providers, and arriving late affects you and other patients whose appointments are after yours.  Also, if you no show three or more times for appointments you may be dismissed from the clinic at the providers discretion.     Again, thank you for choosing [facility].  Our hope is that these requests will decrease the amount of time that you wait before being seen by our physicians.       _____________________________________________________________  Should you have questions after your visit to [facility], please contact our office at [phone] between the hours of 8:30 a.m. and 4:30 p.m.  Voicemails left after 4:30 p.m. will not be returned until the following business day.  For prescription refill requests, have your pharmacy contact our office.       Resources For Cancer Patients and their Caregivers ? American Cancer Society: Can assist with transportation, wigs, general needs, runs Look Good Feel Better.        ([phone] ? Cancer Care: Provides financial assistance, online support groups, medication/co-pay assistance.  1-800-813-HOPE (587) 053-0695) ? [place] Assists [place] cancer patients and their families through emotional , educational and financial support.  403-556-1237 ? [place] DSS Where to apply for food stamps, Medicaid and utility assistance. 2483650191 ? [facility]: Transportation to medical appointments. (913)772-1359 ? Social Security Administration: May apply for disability if have a Stage IV cancer. 340-769-1931 (815)797-2130 ? [employer], Disability and Transit Services: Assists with nutrition, care and transit needs. [place] Support Programs: @10RELATIVEDAYS @ > Cancer Support Group  2nd Tuesday of the month 1pm-2pm, Journey Room  > Creative Journey  3rd Tuesday of the month 1130am-1pm, Journey Room  > Look Good Feel Better  1st Wednesday of the month 10am-12 noon, Journey Room (Call [facility] to register [phone])

## 2017-03-01 ENCOUNTER — Telehealth: Payer: Self-pay | Admitting: Family Medicine

## 2017-03-01 NOTE — Telephone Encounter (Signed)
Returned patients call to schedule appt per request by voicemail on nurse line for pain under left breast.

## 2017-03-05 ENCOUNTER — Encounter: Payer: Self-pay | Admitting: Urgent Care

## 2017-03-05 ENCOUNTER — Ambulatory Visit (INDEPENDENT_AMBULATORY_CARE_PROVIDER_SITE_OTHER): Payer: 59 | Admitting: Urgent Care

## 2017-03-05 ENCOUNTER — Other Ambulatory Visit: Payer: Self-pay | Admitting: Urgent Care

## 2017-03-05 ENCOUNTER — Ambulatory Visit (INDEPENDENT_AMBULATORY_CARE_PROVIDER_SITE_OTHER): Payer: 59

## 2017-03-05 VITALS — BP 126/80 | HR 85 | Temp 98.1°F | Resp 18 | Wt 202.0 lb

## 2017-03-05 DIAGNOSIS — I10 Essential (primary) hypertension: Secondary | ICD-10-CM

## 2017-03-05 DIAGNOSIS — Z9889 Other specified postprocedural states: Secondary | ICD-10-CM | POA: Diagnosis not present

## 2017-03-05 DIAGNOSIS — Z853 Personal history of malignant neoplasm of breast: Secondary | ICD-10-CM

## 2017-03-05 DIAGNOSIS — R0689 Other abnormalities of breathing: Secondary | ICD-10-CM

## 2017-03-05 DIAGNOSIS — R0789 Other chest pain: Secondary | ICD-10-CM | POA: Diagnosis not present

## 2017-03-05 DIAGNOSIS — R079 Chest pain, unspecified: Secondary | ICD-10-CM | POA: Diagnosis not present

## 2017-03-05 MED ORDER — NAPROXEN SODIUM 550 MG PO TABS: 550.0000 mg | ORAL_TABLET | Freq: Two times a day (BID) | ORAL | 1 refills | Status: DC

## 2017-03-05 MED FILL — NAPROXEN SODIUM 550 MG TAB: 550 | 15 days supply | Qty: 30 | Fill #0

## 2017-03-05 NOTE — Progress Notes (Signed)
MRN: [id number] DOB: July 04, 1961  Subjective:   [name] [name] is a 56 y.o. female presenting for chief complaint of Muscle Pain (believes she pulled a muscle under her left breast x 1 week )  Reports 1 week history of left sided chest wall pain. Patient was taking a bracelet pretty forcefully off of her left wrist. As she was pulling the bracelet off of her left wrist, patient felt a pop under her left breast and started feeling pain of her chest since then. Pain is generally rated 8/10, worsened with coughing or twisting, is a sharp pain. Pain does make it difficult to breathe. Has tried ibuprofen with minimal relief. Smokes 3 cigarettes per day. Has a history of HTN. Reports compliance with medical therapy. Breast cancer with lumpectomy 2006, s/p chemo and tamoxifen. Last OV with oncologist was 02/26/2017 with her oncologist and had normal findings. She is scheduled to have mammogram in 04/2017. Denies fever, weight loss, skin changes of breast, nipple inversion, nipple discharge, n/v, abdominal pain.  [name] [name] has a current medication list which includes the following prescription(s): amlodipine, biotin, butalbital-acetaminophen-caffeine, hydrochlorothiazide, ibuprofen, loratadine, ondansetron, and potassium chloride sa. She is allergic to ace inhibitors. [name] [name]  has a past medical history of Adenocarcinoma of breast ([place]); Heel spur; Hyperlipidemia; Hypertension; Migraines; and Obesity. Also  has a past surgical history that includes btl (1992); Cholecystectomy; left lumpectomy and lymph node disection (2000); r heel surgery for spur and chipped bone (11/09/20099); Tubal ligation; Colonoscopy (09/15/2011); and Breast surgery. Her family history includes Diabetes in her sister; Glaucoma in her father; Heart attack in her father and mother; Hypertension in her father.   Objective:   Vitals: BP 126/80   Pulse 85   Temp 98.1 F (36.7 C) (Oral)   Resp 18   Wt 202 lb (91.6 kg)   SpO2 95%   BMI  36.95 kg/m   Physical Exam  Constitutional: She is oriented to person, place, and time. She appears well-developed and well-nourished.  HENT:  Mouth/Throat: Oropharynx is clear and moist.  Eyes: No scleral icterus.  Neck: Normal range of motion. Neck supple. No thyromegaly present.  Cardiovascular: Normal rate, regular rhythm and intact distal pulses.  Exam reveals no gallop and no friction rub.   No murmur heard. Pulmonary/Chest: No respiratory distress. She has no wheezes. She has no rales. She exhibits tenderness (over area depicted).    Musculoskeletal: She exhibits no edema.  Neurological: She is alert and oriented to person, place, and time.  Skin: Skin is warm and dry.  Psychiatric: She has a normal mood and affect.   Dg Ribs Unilateral W/chest Left  Result Date: 03/05/2017 CLINICAL DATA:  Left-sided chest wall pain. Difficulty breathing. History of left breast cancer. EXAM: LEFT RIBS AND CHEST - 3+ VIEW COMPARISON:  07/11/2010 FINDINGS: Again noted are surgical clips in the left axillary region. Lungs are clear without a pneumothorax. A BB was placed in the left lower chest. Concern for a mildly displaced fracture involving the left tenth rib on one of the views. No other definite fractures. IMPRESSION: Question a fracture involving the left tenth rib. This could be more definitively characterized with a CT if needed. Negative for pneumothorax. Electronically Signed   By: Markus Daft M.D.   On: 03/05/2017 11:13   ECG interpretation - normal sinus rhythm.  Assessment and Plan :   1. Chest wall pain 2. Difficulty breathing - Will manage as musculoskeletal type pain. Discussed case with radiologist Dr. Anselm Pancoast and the thought  process was to pursue the chest CT with contrast if there is need to rule out pathologic fracture versus rib fracture. However, patient's HPI is not consistent with either. Will manage as costochondrodritis, work restrictions, use Anaprox as needed for pain and  inflammation. RTC in 2 weeks for a recheck.  3. Essential hypertension - Stable, continue BP medications.  4. History of breast cancer 5. History of lumpectomy of left breast - Stable, I do not suspect this is pathologic fracture and patient just had f/u with her oncologist. Will refer back as needed.  Jaynee Eagles, PA-C [facility] [phone] 03/05/2017  10:17 AM

## 2017-03-05 NOTE — Patient Instructions (Addendum)
Costochondritis Costochondritis is swelling and irritation (inflammation) of the tissue (cartilage) that connects your ribs to your breastbone (sternum). This causes pain in the front of your chest. The pain usually starts gradually and involves more than one rib. What are the causes? The exact cause of this condition is not always known. It results from stress on the cartilage where your ribs attach to your sternum. The cause of this stress could be:  Chest injury (trauma).  Exercise or activity, such as lifting.  Severe coughing.  What increases the risk? You may be at higher risk for this condition if you:  Are female.  Are 30?56 years old.  Recently started a new exercise or work activity.  Have low levels of vitamin D.  Have a condition that makes you cough frequently.  What are the signs or symptoms? The main symptom of this condition is chest pain. The pain:  Usually starts gradually and can be sharp or dull.  Gets worse with deep breathing, coughing, or exercise.  Gets better with rest.  May be worse when you press on the sternum-rib connection (tenderness).  How is this diagnosed? This condition is diagnosed based on your symptoms, medical history, and a physical exam. Your health care provider will check for tenderness when pressing on your sternum. This is the most important finding. You may also have tests to rule out other causes of chest pain. These may include:  A chest X-ray to check for lung problems.  An electrocardiogram (ECG) to see if you have a heart problem that could be causing the pain.  An imaging scan to rule out a chest or rib fracture.  How is this treated? This condition usually goes away on its own over time. Your health care provider may prescribe an NSAID to reduce pain and inflammation. Your health care provider may also suggest that you:  Rest and avoid activities that make pain worse.  Apply heat or cold to the area to reduce pain  and inflammation.  Do exercises to stretch your chest muscles.  If these treatments do not help, your health care provider may inject a numbing medicine at the sternum-rib connection to help relieve the pain. Follow these instructions at home:  Avoid activities that make pain worse. This includes any activities that use chest, abdominal, and side muscles.  If directed, put ice on the painful area: ? Put ice in a plastic bag. ? Place a towel between your skin and the bag. ? Leave the ice on for 20 minutes, 2-3 times a day.  If directed, apply heat to the affected area as often as told by your health care provider. Use the heat source that your health care provider recommends, such as a moist heat pack or a heating pad. ? Place a towel between your skin and the heat source. ? Leave the heat on for 20-30 minutes. ? Remove the heat if your skin turns bright red. This is especially important if you are unable to feel pain, heat, or cold. You may have a greater risk of getting burned.  Take over-the-counter and prescription medicines only as told by your health care provider.  Return to your normal activities as told by your health care provider. Ask your health care provider what activities are safe for you.  Keep all follow-up visits as told by your health care provider. This is important. Contact a health care provider if:  You have chills or a fever.  Your pain does not go   away or it gets worse.  You have a cough that does not go away (is persistent). Get help right away if:  You have shortness of breath. This information is not intended to replace advice given to you by your health care provider. Make sure you discuss any questions you have with your health care provider. Document Released: 06/21/2005 Document Revised: 03/31/2016 Document Reviewed: 01/05/2016 Elsevier Interactive Patient Education  2018 Elsevier Inc.     IF you received an x-ray today, you will receive an  invoice from [facility]. Please contact  Radiology at [phone] with questions or concerns regarding your invoice.   IF you received labwork today, you will receive an invoice from [facility]. Please contact [facility] at [phone] with questions or concerns regarding your invoice.   Our billing staff will not be able to assist you with questions regarding bills from these companies.  You will be contacted with the lab results as soon as they are available. The fastest way to get your results is to activate your My Chart account. Instructions are located on the last page of this paperwork. If you have not heard from us regarding the results in 2 weeks, please contact this office.     

## 2017-03-06 LAB — CBC
HEMATOCRIT: 42.7 % (ref 34.0–46.6)
HEMOGLOBIN: 14 g/dL (ref 11.1–15.9)
MCH: 30.6 pg (ref 26.6–33.0)
MCHC: 32.8 g/dL (ref 31.5–35.7)
MCV: 93 fL (ref 79–97)
Platelets: 435 10*3/uL — ABNORMAL HIGH (ref 150–379)
RBC: 4.58 x10E6/uL (ref 3.77–5.28)
RDW: 14.4 % (ref 12.3–15.4)
WBC: 7.2 10*3/uL (ref 3.4–10.8)

## 2017-03-09 ENCOUNTER — Telehealth: Payer: Self-pay | Admitting: Urgent Care

## 2017-03-09 DIAGNOSIS — R0789 Other chest pain: Secondary | ICD-10-CM

## 2017-03-09 LAB — SEDIMENTATION RATE

## 2017-03-09 LAB — C-REACTIVE PROTEIN: CRP: 8.1 mg/L — ABNORMAL HIGH (ref 0.0–4.9)

## 2017-03-09 LAB — CK: Total CK: 217 U/L — ABNORMAL HIGH (ref 24–173)

## 2017-03-09 NOTE — Telephone Encounter (Signed)
Pt called referrals and left message. Called pt back and she said she was left a message about her platelets being up and that [facility] recommended referring her somewhere but she was not sure where. I do not see referral placed since April of this year. Please advise. Thanks!

## 2017-03-13 NOTE — Telephone Encounter (Signed)
I can see her back for a recheck on her platelets and we should have a referral to cardiology pending for atypical chest pain. If she has not been contacted for the cardiology appointment then let me know so I can place a referral. Thank you!

## 2017-03-13 NOTE — Telephone Encounter (Signed)
Please advise 

## 2017-03-14 NOTE — Telephone Encounter (Signed)
Please try to reschedule patient for a recheck on her cbc.

## 2017-03-14 NOTE — Telephone Encounter (Signed)
The patient has an appointment with you on 6/28. She can keep this appointment, or you want her to come in earlier?

## 2017-03-14 NOTE — Telephone Encounter (Signed)
Called , and left message on AM, advised the patient to return to clinic to recheck her platelets. The patient need a referral for cardiology. Thank you.

## 2017-03-14 NOTE — Telephone Encounter (Signed)
Pt called referrals returning a message she said was left for her. She said she was told she needed to schedule an appt and said she has one scheduled already for 6/28 and wanted to know if she could keep this one or needs to come in earlier. There is no referral placed for cardiology in the pt's chart that I can see and she has not been contacted so if appropriate, please place referral and I will be happy to send. Thanks!

## 2017-03-15 ENCOUNTER — Telehealth: Payer: Self-pay | Admitting: Urgent Care

## 2017-03-15 DIAGNOSIS — Z0271 Encounter for disability determination: Secondary | ICD-10-CM

## 2017-03-15 NOTE — Telephone Encounter (Signed)
Patient needs FMLA forms completed by [facility] for her pulled muscle from 03/05/17 I have completed what I could from the [facility] notes and highlighted the areas that need to be finished. I will place the forms in your box on 03/15/17. Please return them to the FMLA/Disability box at the 102 checkout desk within 5-7 business days. Thank you!

## 2017-03-20 NOTE — Telephone Encounter (Signed)
Referral was placed. I will f/u with her on her current appt date of 03/22/2017.

## 2017-03-22 ENCOUNTER — Ambulatory Visit (INDEPENDENT_AMBULATORY_CARE_PROVIDER_SITE_OTHER): Payer: 59 | Admitting: Urgent Care

## 2017-03-22 ENCOUNTER — Other Ambulatory Visit: Payer: Self-pay | Admitting: Urgent Care

## 2017-03-22 ENCOUNTER — Encounter: Payer: Self-pay | Admitting: Urgent Care

## 2017-03-22 VITALS — BP 118/72 | HR 105 | Temp 98.0°F | Resp 18 | Ht 62.0 in | Wt 202.0 lb

## 2017-03-22 DIAGNOSIS — R0689 Other abnormalities of breathing: Secondary | ICD-10-CM | POA: Diagnosis not present

## 2017-03-22 DIAGNOSIS — D473 Essential (hemorrhagic) thrombocythemia: Secondary | ICD-10-CM

## 2017-03-22 DIAGNOSIS — D75839 Thrombocytosis, unspecified: Secondary | ICD-10-CM

## 2017-03-22 DIAGNOSIS — R0789 Other chest pain: Secondary | ICD-10-CM | POA: Diagnosis not present

## 2017-03-22 NOTE — Telephone Encounter (Signed)
Will complete this today in clinic with patient.

## 2017-03-22 NOTE — Patient Instructions (Addendum)
We will decide on a follow up plan once your lab results are in. This may take up to 1 week. If your chest pain worsens, please do not hesitate to come back for a recheck.   Chest Wall Pain Chest wall pain is pain in or around the bones and muscles of your chest. Sometimes, an injury causes this pain. Sometimes, the cause may not be known. This pain may take several weeks or longer to get better. Follow these instructions at home: Pay attention to any changes in your symptoms. Take these actions to help with your pain:  Rest as told by your health care provider.  Avoid activities that cause pain. These include any activities that use your chest muscles or your abdominal and side muscles to lift heavy items.  If directed, apply ice to the painful area: ? Put ice in a plastic bag. ? Place a towel between your skin and the bag. ? Leave the ice on for 20 minutes, 2-3 times per day.  Take over-the-counter and prescription medicines only as told by your health care provider.  Do not use tobacco products, including cigarettes, chewing tobacco, and e-cigarettes. If you need help quitting, ask your health care provider.  Keep all follow-up visits as told by your health care provider. This is important.  Contact a health care provider if:  You have a fever.  Your chest pain becomes worse.  You have new symptoms. Get help right away if:  You have nausea or vomiting.  You feel sweaty or light-headed.  You have a cough with phlegm (sputum) or you cough up blood.  You develop shortness of breath. This information is not intended to replace advice given to you by your health care provider. Make sure you discuss any questions you have with your health care provider. Document Released: 09/11/2005 Document Revised: 01/20/2016 Document Reviewed: 12/07/2014 Elsevier Interactive Patient Education  2017 [employer].     Platelet Count Test Why am I having this test? The platelet count  test is performed to obtain a measure of how many platelets you have within a specific amount (volume) of blood. Platelets are specialized cells made in your bone marrow that gather at the site of tissue injury to stop bleeding. Most of the platelets in your body can be found in your bloodstream. Fewer platelets are stored in your liver and spleen. Your health care provider may want you to have this test if you have a rash or other medical condition that suggests that you have a low platelet count. This may include one of these conditions that cause excess bleeding or delayed blood clotting, such as:  Petechiae. These are small hemorrhages in the skin that cause a rash. The rash appears as a collection of red-purple pinprick-sized dots.  Heavy menstrual bleeding, if you are female.  Thrombocytopenia. This is a condition in which you have a low platelet count.  Bone marrow failure.  This test may also be used to monitor treatment for those conditions. What kind of sample is taken? A blood sample is required for this test. It is usually collected by inserting a needle into a vein or by sticking a finger with a small needle. How do I prepare for this test? There is no preparation or fasting required for this test. What are the reference ranges? Reference ranges are considered healthy ranges established after testing a large group of healthy people. Reference ranges may vary among different people, labs, and hospitals. It is your  responsibility to obtain your test results. Ask the lab or department performing the test when and how you will get your results.  Adult or elderly: 150,000-400,000 per microliter.  Child: 150,000-400,000 per microliter.  Infant: 200,000-475,000 per microliter.  Premature infant: 100,000-300,000 per microliter.  Newborn: 150,000-300,000 per microliter.  What do the results mean? Results that are higher than the reference range can indicate a number of health  conditions. These may include:  Certain types of cancer, such as leukemia or lymphoma.  Polycythemia vera. This is a condition in which the bone marrow is producing excess amounts of all cell types, including platelets.  Postsplenectomy syndrome. This is a condition that can occur after surgery that is done to remove the spleen (splenectomy).  Rheumatoid arthritis.  Iron-deficiency anemia.  Results that are lower than the reference range can also indicate a number of health conditions. These may include:  Hypersplenism. This is a condition in which the spleen is breaking down platelets faster than normal.  Bleeding (hemorrhage).  Immune thrombocytopenia.  Cancer or chemotherapy treatments for cancers such as leukemia.  Thrombotic thrombocytopenia.  HELLP syndrome.  Abnormal thyroid gland function, such as in Graves disease.  Certain inherited disorders that cause a decreased platelet count.  Disseminated intravascular coagulation (DIC). This is a condition in which abnormal blood-clotting processes occur.  Systemic lupus erythematosus (SLE).  Certain types of anemia, such as pernicious and hemolytic anemias.  Infection.  Talk with your health care provider to discuss your results, treatment options, and if necessary, the need for more tests. Talk with your health care provider if you have any questions about your results. This information is not intended to replace advice given to you by your health care provider. Make sure you discuss any questions you have with your health care provider. Document Released: 10/06/2004 Document Revised: 05/17/2016 Document Reviewed: 02/04/2014 Elsevier Interactive Patient Education  2018 [employer].      IF you received an x-ray today, you will receive an invoice from [facility]. Please contact [facility] at [phone] with questions or concerns regarding your invoice.   IF you received labwork today, you will  receive an invoice from [facility]. Please contact [facility] at [phone] with questions or concerns regarding your invoice.   Our billing staff will not be able to assist you with questions regarding bills from these companies.  You will be contacted with the lab results as soon as they are available. The fastest way to get your results is to activate your My Chart account. Instructions are located on the last page of this paperwork. If you have not heard from [place] regarding the results in 2 weeks, please contact this office.

## 2017-03-22 NOTE — Progress Notes (Signed)
    MRN: [id number] DOB: 05/30/1961  Subjective:   [name] [name] is a 56 y.o. female presenting for follow up on chest wall pain. Last OV was 03/05/2017. Patient was managed for costochondritis. Labs were abnormal for thrombocytosis, elevated CK and CRP. Patient has scheduled appointment with cardiology for 04/13/2017. Today, she reports significant improvement in her symptoms. Patient was placed on work restrictions but held from work. She is requiring FMLA paperwork. She was out between 06/11-19/2018. Forms were completed with patient in clinic. She states that she is doing much better. She no longer needs Anaprox. She is back to doing her normal work tasks with minimal difficulty. She is breathing much better and without chest wall pain. Denies bruising, bony deformity, shob.  [name] has a current medication list which includes the following prescription(s): amlodipine, biotin, butalbital-acetaminophen-caffeine, hydrochlorothiazide, ibuprofen, loratadine, naproxen sodium, and potassium chloride sa. Also is allergic to ace inhibitors. [name]  has a past medical history of Adenocarcinoma of breast ([place]); Heel spur; Hyperlipidemia; Hypertension; Migraines; and Obesity. Also  has a past surgical history that includes btl (1992); Cholecystectomy; left lumpectomy and lymph node disection (2000); r heel surgery for spur and chipped bone (11/09/20099); Tubal ligation; Colonoscopy (09/15/2011); and Breast surgery.  Objective:   Vitals: BP 118/72   Pulse (!) 105   Temp 98 F (36.7 C) (Oral)   Resp 18   Ht 5\' 2"  (1.575 m)   Wt 202 lb (91.6 kg)   SpO2 95%   BMI 36.95 kg/m   Pulse was 92 on recheck by PA-Dacie Mandel.  Physical Exam  Constitutional: She is oriented to person, place, and time. She appears well-developed and well-nourished.  Cardiovascular: Normal rate, regular rhythm and intact distal pulses.  Exam reveals no gallop and no friction rub.   No murmur heard. Pulmonary/Chest: No respiratory  distress. She has no wheezes. She has no rales. She exhibits tenderness (mild over area depicted).    Neurological: She is alert and oriented to person, place, and time.    Assessment and Plan :   1. Atypical chest pain 2. Chest wall pain - Labs and cardiology referral pending. FMLA paperwork completed.  3. Difficulty breathing - Improved, monitor.  4. Thrombocytosis ([place]) - Labs pending, will establish f/u thereafter.  Jaynee Eagles, PA-C [facility] [phone] 03/22/2017 3:57 PM

## 2017-03-26 NOTE — Telephone Encounter (Signed)
Paperwork scanned and faxed on 03/26/17

## 2017-03-30 ENCOUNTER — Other Ambulatory Visit: Payer: Self-pay | Admitting: Family Medicine

## 2017-04-02 MED FILL — HYDROCHLOROTHIAZIDE 25 MG T: 25 | 90 days supply | Qty: 90 | Fill #0

## 2017-04-02 MED FILL — IBUPROFEN 800 MG TABLET: 800 | 30 days supply | Qty: 30 | Fill #1

## 2017-04-04 LAB — PATHOLOGIST SMEAR REVIEW
BASOS: 0 %
Basophils Absolute: 0 10*3/uL (ref 0.0–0.2)
EOS (ABSOLUTE): 0.4 10*3/uL (ref 0.0–0.4)
EOS: 4 %
HEMOGLOBIN: 13.5 g/dL (ref 11.1–15.9)
Hematocrit: 40.5 % (ref 34.0–46.6)
IMMATURE GRANULOCYTES: 0 %
Immature Grans (Abs): 0 10*3/uL (ref 0.0–0.1)
LYMPHS ABS: 3.5 10*3/uL — AB (ref 0.7–3.1)
Lymphs: 39 %
MCH: 29.7 pg (ref 26.6–33.0)
MCHC: 33.3 g/dL (ref 31.5–35.7)
MCV: 89 fL (ref 79–97)
Monocytes Absolute: 0.6 10*3/uL (ref 0.1–0.9)
Monocytes: 6 %
NEUTROS ABS: 4.5 10*3/uL (ref 1.4–7.0)
Neutrophils: 51 %
PATH REV RBC: NORMAL
Platelets: 485 10*3/uL — ABNORMAL HIGH (ref 150–379)
RBC: 4.54 x10E6/uL (ref 3.77–5.28)
RDW: 14.5 % (ref 12.3–15.4)
WBC: 8.9 10*3/uL (ref 3.4–10.8)

## 2017-04-04 LAB — CK: Total CK: 281 U/L — ABNORMAL HIGH (ref 24–173)

## 2017-04-04 LAB — C-REACTIVE PROTEIN: CRP: 9.1 mg/L — AB (ref 0.0–4.9)

## 2017-04-04 LAB — SEDIMENTATION RATE: Sed Rate: 21 mm/hr (ref 0–40)

## 2017-04-09 ENCOUNTER — Ambulatory Visit (INDEPENDENT_AMBULATORY_CARE_PROVIDER_SITE_OTHER): Payer: 59 | Admitting: Urgent Care

## 2017-04-09 ENCOUNTER — Encounter: Payer: Self-pay | Admitting: Urgent Care

## 2017-04-09 VITALS — BP 120/77 | HR 88 | Temp 97.9°F | Resp 18 | Ht 62.0 in | Wt 202.0 lb

## 2017-04-09 DIAGNOSIS — R0789 Other chest pain: Secondary | ICD-10-CM | POA: Diagnosis not present

## 2017-04-09 DIAGNOSIS — D473 Essential (hemorrhagic) thrombocythemia: Secondary | ICD-10-CM | POA: Diagnosis not present

## 2017-04-09 DIAGNOSIS — Z853 Personal history of malignant neoplasm of breast: Secondary | ICD-10-CM

## 2017-04-09 DIAGNOSIS — R748 Abnormal levels of other serum enzymes: Secondary | ICD-10-CM

## 2017-04-09 DIAGNOSIS — R7982 Elevated C-reactive protein (CRP): Secondary | ICD-10-CM | POA: Diagnosis not present

## 2017-04-09 DIAGNOSIS — D72820 Lymphocytosis (symptomatic): Secondary | ICD-10-CM

## 2017-04-09 DIAGNOSIS — D75839 Thrombocytosis, unspecified: Secondary | ICD-10-CM

## 2017-04-09 NOTE — Patient Instructions (Addendum)
Reactive Thrombocytosis Reactive thrombocytosis is when you have too many platelets (thrombocytes) in your blood. Platelets are tiny elements in the blood that stick together and form a clot (thrombus). Platelets help your body stop bleeding. Conditions that cause inflammation, such as cancer, may trigger your body to make more platelets than normal. This condition may also be called secondary thrombocytosis. What are the causes? Many things can cause this condition, including:  Having your spleen surgically removed (splenectomy).  Injury (trauma).  Certain infections.  Very bad bleeding.  Low red blood cell count from not having enough iron (iron deficiency anemia).  Having a disease that destroys your red blood cells (hemolytic anemia).  Not having enough vitamin B-12.  Inflammatory bowel disease (Crohn disease or ulcerative colitis).  Cancer, especially lymphoma, breast, stomach, and ovarian cancers.  Alcohol abuse.  Certain medicines.  What are the signs or symptoms?  It can be hard to tell the difference between symptoms of reactive thrombocytosis and symptoms of the underlying condition. If you have symptoms, they may include:  Weakness.  Headache.  Dizziness or confusion.  Chest pain or shortness of breath.  Tingling or burning in your hands or feet.  How is this diagnosed? This condition may be diagnosed based on routine blood tests or while you are being evaluated for another condition. You may also need tests to confirm the diagnosis. These may include:  Additionalblood tests.  A procedure to collect a sample of bone marrow (bone marrow aspiration).  How is this treated? Treatment for this condition depends on the cause. Your platelet count may return to normal after the underlying cause is treated. If your platelet count is very high, you may have to take medicine to prevent blood clots as told by your health care provider. Follow these instructions at  home:  Take over-the-counter and prescription medicines only as told by your health care provider.  Work with your health care provider to: ? Treat the condition causing reactive thrombocytosis. ? Control any other conditions you may have, such as high blood pressure, high cholesterol, and diabetes.  Do not smoke. If you need help quitting, talk to your health care provider.  Keep all follow-up visits as told by your health care provider. This is important. Contact a health care provider if:  You have a headache that you cannot control.  You faint. Get help right away if:  You suddenly develop a headache, weakness, or drooping in your face.  You suddenly cannot speak or understand speech.  You have chest pain.  You have trouble breathing. This information is not intended to replace advice given to you by your health care provider. Make sure you discuss any questions you have with your health care provider. Document Released: 06/02/2015 Document Revised: 02/17/2016 Document Reviewed: 12/16/2014 Elsevier Interactive Patient Education  2018 [employer].     IF you received an x-ray today, you will receive an invoice from [facility]. Please contact [facility] at [phone] with questions or concerns regarding your invoice.   IF you received labwork today, you will receive an invoice from [facility]. Please contact [facility] at [phone] with questions or concerns regarding your invoice.   Our billing staff will not be able to assist you with questions regarding bills from these companies.  You will be contacted with the lab results as soon as they are available. The fastest way to get your results is to activate your My Chart account. Instructions are located on the last page of this  paperwork. If you have not heard from us regarding the results in 2 weeks, please contact this office.      

## 2017-04-09 NOTE — Progress Notes (Signed)
    MRN: [id number] DOB: [birth year]  Subjective:   [name] [name] is a 56 y.o. female presenting for follow up on atypical chest pain, abnormal labs. Last OV was 03/22/2017, was seen for follow up on her atypical chest pain. She has not yet seen a cardiologist but admits that she no longer has chest pain. She is scheduled for consult in August 2018. Has a history of treatment for breast cancer with lumpectomy of left breast, chem and radiation about 12 years ago and has been in remission since. Denies fever, skin changes, nipple discharge, headaches, cough, weight loss, decreased appetite, n/v, abdominal pain, bruising, bleeding gums, cold intolerance, myalgia, malaise. Denies smoking cigarettes. Last screening mammogram was in 04/2016, was BIRADS 1. Of note, patient has previously had elevated CRP. Last pap smear was 05/18/2016 and was normal, also had negative hemoccult from same date.   [name] [name] has a current medication list which includes the following prescription(s): amlodipine, biotin, butalbital-acetaminophen-caffeine, hydrochlorothiazide, ibuprofen, loratadine, and potassium chloride sa. Also is allergic to ace inhibitors. [name] [name]  has a past medical history of Adenocarcinoma of breast ([place]); Heel spur; Hyperlipidemia; Hypertension; Migraines; and Obesity. Also  has a past surgical history that includes btl (1992); Cholecystectomy; left lumpectomy and lymph node disection (2000); r heel surgery for spur and chipped bone (11/09/20099); Tubal ligation; Colonoscopy (09/15/2011); and Breast surgery.  Objective:   Vitals: BP 120/77   Pulse 88   Temp 97.9 F (36.6 C) (Oral)   Resp 18   Ht 5\' 2"  (1.575 m)   Wt 202 lb (91.6 kg)   SpO2 99%   BMI 36.95 kg/m   Physical Exam  Constitutional: She is oriented to person, place, and time. She appears well-developed and well-nourished.  HENT:  Mouth/Throat: Oropharynx is clear and moist.  Eyes: No scleral icterus.  Neck: Normal range of motion. Neck  supple.  Cardiovascular: Normal rate, regular rhythm and intact distal pulses.  Exam reveals no gallop and no friction rub.   No murmur heard. Pulmonary/Chest: No respiratory distress. She has no wheezes. She has no rales.  Abdominal: She exhibits no distension and no mass. There is no tenderness. There is no guarding.  Lymphadenopathy:    She has no cervical adenopathy.  Neurological: She is alert and oriented to person, place, and time.  Skin: Skin is warm and dry. Capillary refill takes less than 2 seconds.  Psychiatric: She has a normal mood and affect.    Assessment and Plan :   1. Thrombocytosis ([facility]) 2. Lymphocytosis 3. Elevated CK 4. Elevated C-reactive protein (CRP) - Will refer to hematology for further work up. Symptoms and physical exam findings reassuring but peripheral smear dictates further work up.  5. Atypical chest pain - Resolved, keep appointment with cardiology. - Comprehensive metabolic panel - Sedimentation Rate  6. History of breast cancer - Keep f/u with oncology.  Jaynee Eagles, PA-C [facility] [phone] 04/09/2017 2:32 PM

## 2017-04-10 LAB — CBC WITH DIFFERENTIAL/PLATELET
BASOS ABS: 0.1 10*3/uL (ref 0.0–0.2)
Basos: 1 %
EOS (ABSOLUTE): 0.2 10*3/uL (ref 0.0–0.4)
Eos: 2 %
HEMOGLOBIN: 13.7 g/dL (ref 11.1–15.9)
Hematocrit: 39.9 % (ref 34.0–46.6)
IMMATURE GRANS (ABS): 0 10*3/uL (ref 0.0–0.1)
Immature Granulocytes: 0 %
LYMPHS ABS: 4.5 10*3/uL — AB (ref 0.7–3.1)
LYMPHS: 44 %
MCH: 30.3 pg (ref 26.6–33.0)
MCHC: 34.3 g/dL (ref 31.5–35.7)
MCV: 88 fL (ref 79–97)
MONOCYTES: 5 %
Monocytes Absolute: 0.5 10*3/uL (ref 0.1–0.9)
NEUTROS ABS: 5.1 10*3/uL (ref 1.4–7.0)
Neutrophils: 48 %
PLATELETS: 478 10*3/uL — AB (ref 150–379)
RBC: 4.52 x10E6/uL (ref 3.77–5.28)
RDW: 14.9 % (ref 12.3–15.4)
WBC: 10.4 10*3/uL (ref 3.4–10.8)

## 2017-04-10 LAB — COMPREHENSIVE METABOLIC PANEL
A/G RATIO: 1.6 (ref 1.2–2.2)
ALT: 14 IU/L (ref 0–32)
AST: 12 IU/L (ref 0–40)
Albumin: 4.6 g/dL (ref 3.5–5.5)
Alkaline Phosphatase: 75 IU/L (ref 39–117)
BUN/Creatinine Ratio: 15 (ref 9–23)
BUN: 11 mg/dL (ref 6–24)
CHLORIDE: 103 mmol/L (ref 96–106)
CO2: 22 mmol/L (ref 20–29)
Calcium: 10 mg/dL (ref 8.7–10.2)
Creatinine, Ser: 0.75 mg/dL (ref 0.57–1.00)
GFR calc Af Amer: 104 mL/min/{1.73_m2} (ref >59)
GFR calc non Af Amer: 90 mL/min/{1.73_m2} (ref >59)
GLUCOSE: 87 mg/dL (ref 65–99)
Globulin, Total: 2.9 g/dL (ref 1.5–4.5)
POTASSIUM: 4 mmol/L (ref 3.5–5.2)
Sodium: 144 mmol/L (ref 134–144)
Total Protein: 7.5 g/dL (ref 6.0–8.5)

## 2017-04-10 LAB — C-REACTIVE PROTEIN: CRP: 9.3 mg/L — AB (ref 0.0–4.9)

## 2017-04-10 LAB — SEDIMENTATION RATE: SED RATE: 8 mm/h (ref 0–40)

## 2017-04-10 LAB — CK: CK TOTAL: 194 U/L — AB (ref 24–173)

## 2017-04-10 LAB — FERRITIN: FERRITIN: 122 ng/mL (ref 15–150)

## 2017-04-12 ENCOUNTER — Telehealth: Payer: Self-pay

## 2017-04-12 NOTE — Telephone Encounter (Signed)
Pt called stating she was returning you phone call and she gets off an 3 pm to Please call her.

## 2017-04-12 NOTE — Telephone Encounter (Signed)
I left VM for patient. Please review result note with her or have her check her voice message.

## 2017-04-30 ENCOUNTER — Other Ambulatory Visit: Payer: Self-pay

## 2017-04-30 ENCOUNTER — Other Ambulatory Visit: Payer: Self-pay | Admitting: Family Medicine

## 2017-04-30 DIAGNOSIS — I10 Essential (primary) hypertension: Secondary | ICD-10-CM | POA: Diagnosis not present

## 2017-04-30 DIAGNOSIS — R0789 Other chest pain: Secondary | ICD-10-CM | POA: Diagnosis not present

## 2017-04-30 DIAGNOSIS — Z853 Personal history of malignant neoplasm of breast: Secondary | ICD-10-CM | POA: Diagnosis not present

## 2017-04-30 DIAGNOSIS — Z0189 Encounter for other specified special examinations: Secondary | ICD-10-CM | POA: Diagnosis not present

## 2017-04-30 MED ORDER — POTASSIUM CHLORIDE CRYS ER 20 MEQ PO TBCR: 20.0000 meq | EXTENDED_RELEASE_TABLET | Freq: Every day | ORAL | 1 refills | Status: DC

## 2017-04-30 MED ORDER — POTASSIUM CHLORIDE CRYS ER 20 MEQ PO TBCR
20.0000 meq | EXTENDED_RELEASE_TABLET | Freq: Every day | ORAL | 99 refills | Status: DC
Start: 2017-04-30 — End: 2017-04-30

## 2017-04-30 MED FILL — POTASSIUM CL ER 20 MEQ TABL: 20 | 90 days supply | Qty: 90 | Fill #0

## 2017-05-04 DIAGNOSIS — R0789 Other chest pain: Secondary | ICD-10-CM | POA: Diagnosis not present

## 2017-05-04 DIAGNOSIS — I10 Essential (primary) hypertension: Secondary | ICD-10-CM | POA: Diagnosis not present

## 2017-05-07 ENCOUNTER — Ambulatory Visit (HOSPITAL_COMMUNITY)
Admission: RE | Admit: 2017-05-07 | Discharge: 2017-05-07 | Disposition: A | Discharge status: Home / Self Care | Payer: 59 | Source: Ambulatory Visit | Attending: Family Medicine | Admitting: Family Medicine

## 2017-05-07 DIAGNOSIS — Z1231 Encounter for screening mammogram for malignant neoplasm of breast: Secondary | ICD-10-CM | POA: Diagnosis not present

## 2017-05-07 MED FILL — AMLODIPINE BESYLATE 2.5 MG: 2.5 | 90 days supply | Qty: 90 | Fill #1

## 2017-05-23 ENCOUNTER — Encounter: Payer: Self-pay | Admitting: Family Medicine

## 2017-05-23 DIAGNOSIS — I10 Essential (primary) hypertension: Secondary | ICD-10-CM | POA: Diagnosis not present

## 2017-05-23 DIAGNOSIS — R0789 Other chest pain: Secondary | ICD-10-CM | POA: Diagnosis not present

## 2017-05-23 DIAGNOSIS — Z853 Personal history of malignant neoplasm of breast: Secondary | ICD-10-CM | POA: Diagnosis not present

## 2017-06-04 ENCOUNTER — Ambulatory Visit (INDEPENDENT_AMBULATORY_CARE_PROVIDER_SITE_OTHER): Payer: 59 | Admitting: Family Medicine

## 2017-06-04 ENCOUNTER — Encounter: Payer: Self-pay | Admitting: Family Medicine

## 2017-06-04 VITALS — BP 130/88 | HR 87 | Temp 97.6°F | Resp 16 | Ht 62.0 in | Wt 199.8 lb

## 2017-06-04 DIAGNOSIS — I10 Essential (primary) hypertension: Secondary | ICD-10-CM

## 2017-06-04 DIAGNOSIS — Z1211 Encounter for screening for malignant neoplasm of colon: Secondary | ICD-10-CM

## 2017-06-04 DIAGNOSIS — E669 Obesity, unspecified: Secondary | ICD-10-CM

## 2017-06-04 DIAGNOSIS — F17218 Nicotine dependence, cigarettes, with other nicotine-induced disorders: Secondary | ICD-10-CM | POA: Diagnosis not present

## 2017-06-04 DIAGNOSIS — E784 Other hyperlipidemia: Secondary | ICD-10-CM

## 2017-06-04 DIAGNOSIS — Z0000 Encounter for general adult medical examination without abnormal findings: Secondary | ICD-10-CM

## 2017-06-04 DIAGNOSIS — E7849 Other hyperlipidemia: Secondary | ICD-10-CM

## 2017-06-04 DIAGNOSIS — IMO0001 Reserved for inherently not codable concepts without codable children: Secondary | ICD-10-CM

## 2017-06-04 LAB — HEMOCCULT GUIAC POC 1CARD (OFFICE): Fecal Occult Blood, POC: NEGATIVE

## 2017-06-04 NOTE — Assessment & Plan Note (Signed)
unchanged. Patient re-educated about  the importance of commitment to a  minimum of 150 minutes of exercise per week.  The importance of healthy food choices with portion control discussed. Encouraged to start a food diary, count calories and to consider  joining a support group. Sample diet sheets offered. Goals set by the patient for the next several months.   Weight /BMI 06/04/2017 04/09/2017 03/22/2017  WEIGHT 199 lb 12 oz 202 lb 202 lb  HEIGHT 5\' 2"  5\' 2"  5\' 2"   BMI 36.53 kg/m2 36.95 kg/m2 36.95 kg/m2

## 2017-06-04 NOTE — Assessment & Plan Note (Signed)
Patient is asked and  confirms current  Nicotine use.  Five to seven minutes of time is spent in counseling the patient of the need to quit smoking  Advice to quit is delivered clearly specifically in reducing the risk of developing heart disease, having a stroke, or of developing all types of cancer, especially lung and oral cancer. Improvement in breathing and exercise tolerance and quality of life is also discussed, as is the economic benefit.  Assessment of willingness to quit or to make an attempt to quit is made and documented  Assistance in quit attempt is made with several and varied options presented, based on patient's desire and need. These include  literature, local classes available, 1800 QUIT NOW number, OTC and prescription medication.  The GOAL to be NICOTINE FREE is re emphasized.  The patient has set a personal goal of either reduction or discontinuation and follow up is arranged between 6 an 16 weeks.  Quit date set for 06/24/2017

## 2017-06-04 NOTE — Progress Notes (Signed)
[name] [name]     MRN: [id number]      DOB: 14-Aug-1961  HPI: Patient is in for annual physical exam. . Immunization is reviewed , patient is gholding   PE: BP 130/88 (BP Location: Left Arm, Patient Position: Sitting, Cuff Size: Normal)   Pulse 87   Temp 97.6 F (36.4 C) (Other (Comment))   Resp 16   Ht 5\' 2"  (1.575 m)   Wt 199 lb 12 oz (90.6 kg)   SpO2 97%   BMI 36.53 kg/m   Pleasant  female, alert and oriented x 3, in no cardio-pulmonary distress. Afebrile. HEENT No facial trauma or asymetry. Sinuses non tender.  Extra occullar muscles intact, External ears normal, tympanic membranes clear. Oropharynx moist, no exudate. Neck: supple, no adenopathy,JVD or thyromegaly.No bruits.  Chest: Clear to ascultation bilaterally.No crackles or wheezes. Non tender to palpation  Breast: No  masses or lumps. No tenderness. No nipple discharge or inversion. No axillary or supraclavicular adenopathy  Cardiovascular system; Heart sounds normal,  S1 and  S2 ,no S3.  No murmur, or thrill. Apical beat not displaced Peripheral pulses normal.  Abdomen: Soft, non tender, no organomegaly or masses. No bruits. Bowel sounds normal. No guarding, tenderness or rebound.  Rectal:  Normal sphincter tone. No rectal mass. Guaiac negative stool.  GU: Not examined  Musculoskeletal exam: Full ROM of spine, hips , shoulders and knees. No deformity ,swelling or crepitus noted. No muscle wasting or atrophy.   Neurologic: Cranial nerves 2 to 12 intact. Power, tone ,sensation and reflexes normal throughout. No disturbance in gait. No tremor.  Skin: Intact, no ulceration, erythema , scaling or rash noted. Pigmentation normal throughout  Psych; Normal mood and affect. Judgement and concentration normal   Assessment & Plan:  Annual physical exam Annual exam as documented. Counseling done  re healthy lifestyle involving commitment to 150 minutes exercise per week, heart healthy  diet, and attaining healthy weight.The importance of adequate sleep also discussed.  Immunization and cancer screening needs are specifically addressed at this visit.   Nicotine dependence Patient is asked and  confirms current  Nicotine use.  Five to seven minutes of time is spent in counseling the patient of the need to quit smoking  Advice to quit is delivered clearly specifically in reducing the risk of developing heart disease, having a stroke, or of developing all types of cancer, especially lung and oral cancer. Improvement in breathing and exercise tolerance and quality of life is also discussed, as is the economic benefit.  Assessment of willingness to quit or to make an attempt to quit is made and documented  Assistance in quit attempt is made with several and varied options presented, based on patient's desire and need. These include  literature, local classes available, 1800 QUIT NOW number, OTC and prescription medication.  The GOAL to be NICOTINE FREE is re emphasized.  The patient has set a personal goal of either reduction or discontinuation and follow up is arranged between 6 an 16 weeks.  Quit date set for 06/24/2017  Obesity, Class II, BMI 35-39.9, with comorbidity unchanged. Patient re-educated about  the importance of commitment to a  minimum of 150 minutes of exercise per week.  The importance of healthy food choices with portion control discussed. Encouraged to start a food diary, count calories and to consider  joining a support group. Sample diet sheets offered. Goals set by the patient for the next several months.   Weight /BMI 06/04/2017 04/09/2017 03/22/2017  WEIGHT 199 lb 12 oz 202 lb 202 lb  HEIGHT 5\' 2"  5\' 2"  5\' 2"   BMI 36.53 kg/m2 36.95 kg/m2 36.95 kg/m2

## 2017-06-04 NOTE — Patient Instructions (Signed)
F/u in 4 months, call if you need me before  Weight loss goal of 10 pounds   Fasting lipid and chem 7 1 week before next visit  It is important that you exercise regularly at least 30 minutes 7 times a week. If you develop chest pain, have severe difficulty breathing, or feel very tired, stop exercising immediately and seek medical attention   Please work on good  health habits so that your health will improve. 1. Commitment to daily physical activity for 30 to 60  minutes, if you are able to do this.  2. Commitment to wise food choices. Aim for half of your  food intake to be vegetable and fruit, one quarter starchy foods, and one quarter protein. Try to eat on a regular schedule  3 meals per day, snacking between meals should be limited to vegetables or fruits or small portions of nuts. 64 ounces of water per day is generally recommended, unless you have specific health conditions, like heart failure or kidney failure where you will need to limit fluid intake.  3. Commitment to sufficient and a  good quality of physical and mental rest daily, generally between 6 to 8 hours per day.  WITH PERSISTANCE AND PERSEVERANCE, THE IMPOSSIBLE , BECOMES THE NORM!   Thank you  for choosing [facility]. We consider it a privelige to serve you.  Delivering excellent health care in a caring and  compassionate way is our goal.  Partnering with you,  so that together we can achieve this goal is our strategy.

## 2017-06-04 NOTE — Assessment & Plan Note (Signed)
Annual exam as documented. Counseling done  re healthy lifestyle involving commitment to 150 minutes exercise per week, heart healthy diet, and attaining healthy weight.The importance of adequate sleep also discussed.  Immunization and cancer screening needs are specifically addressed at this visit.  

## 2017-06-11 DIAGNOSIS — Z720 Tobacco use: Secondary | ICD-10-CM | POA: Diagnosis not present

## 2017-06-11 DIAGNOSIS — R0789 Other chest pain: Secondary | ICD-10-CM | POA: Diagnosis not present

## 2017-06-11 DIAGNOSIS — E782 Mixed hyperlipidemia: Secondary | ICD-10-CM | POA: Diagnosis not present

## 2017-06-11 DIAGNOSIS — I10 Essential (primary) hypertension: Secondary | ICD-10-CM | POA: Diagnosis not present

## 2017-06-25 ENCOUNTER — Encounter (HOSPITAL_COMMUNITY): Payer: 59 | Attending: Oncology | Admitting: Oncology

## 2017-06-25 ENCOUNTER — Encounter (HOSPITAL_COMMUNITY): Payer: 59

## 2017-06-25 ENCOUNTER — Encounter (HOSPITAL_COMMUNITY): Payer: Self-pay

## 2017-06-25 DIAGNOSIS — Z9889 Other specified postprocedural states: Secondary | ICD-10-CM | POA: Insufficient documentation

## 2017-06-25 DIAGNOSIS — D75839 Thrombocytosis, unspecified: Secondary | ICD-10-CM

## 2017-06-25 DIAGNOSIS — I10 Essential (primary) hypertension: Secondary | ICD-10-CM | POA: Diagnosis not present

## 2017-06-25 DIAGNOSIS — Z853 Personal history of malignant neoplasm of breast: Secondary | ICD-10-CM | POA: Diagnosis not present

## 2017-06-25 DIAGNOSIS — Z888 Allergy status to other drugs, medicaments and biological substances status: Secondary | ICD-10-CM | POA: Insufficient documentation

## 2017-06-25 DIAGNOSIS — F17210 Nicotine dependence, cigarettes, uncomplicated: Secondary | ICD-10-CM | POA: Diagnosis not present

## 2017-06-25 DIAGNOSIS — G43909 Migraine, unspecified, not intractable, without status migrainosus: Secondary | ICD-10-CM | POA: Diagnosis not present

## 2017-06-25 DIAGNOSIS — E785 Hyperlipidemia, unspecified: Secondary | ICD-10-CM | POA: Insufficient documentation

## 2017-06-25 DIAGNOSIS — D473 Essential (hemorrhagic) thrombocythemia: Secondary | ICD-10-CM

## 2017-06-25 DIAGNOSIS — Z833 Family history of diabetes mellitus: Secondary | ICD-10-CM | POA: Insufficient documentation

## 2017-06-25 DIAGNOSIS — Z8249 Family history of ischemic heart disease and other diseases of the circulatory system: Secondary | ICD-10-CM | POA: Diagnosis not present

## 2017-06-25 DIAGNOSIS — E669 Obesity, unspecified: Secondary | ICD-10-CM | POA: Diagnosis not present

## 2017-06-25 DIAGNOSIS — Z9049 Acquired absence of other specified parts of digestive tract: Secondary | ICD-10-CM | POA: Diagnosis not present

## 2017-06-25 DIAGNOSIS — Z79899 Other long term (current) drug therapy: Secondary | ICD-10-CM | POA: Insufficient documentation

## 2017-06-25 DIAGNOSIS — Z791 Long term (current) use of non-steroidal anti-inflammatories (NSAID): Secondary | ICD-10-CM | POA: Diagnosis not present

## 2017-06-25 DIAGNOSIS — Z9221 Personal history of antineoplastic chemotherapy: Secondary | ICD-10-CM | POA: Insufficient documentation

## 2017-06-25 LAB — TSH: TSH: 1.233 u[IU]/mL (ref 0.350–4.500)

## 2017-06-25 LAB — C-REACTIVE PROTEIN: CRP: <0.8 mg/dL (ref <1.0)

## 2017-06-25 NOTE — Progress Notes (Signed)
[facility] Initial Visit:  Patient Care Team: Fayrene Helper, MD as PCP - General  CHIEF COMPLAINTS/PURPOSE OF CONSULTATION:  Mild thrombocytosis  HISTORY OF PRESENTING ILLNESS: [name] [name] 56 y.o. female with remote history of breast cancer 12 years ago, treated with surgery, chemo, and RT presents today for evaluation of mild thrombocytosis. I do not have any recent bloodwork earlier CBC from 04/09/2017 demonstrated WBC 10.4 K, hemoglobin 13.7 K, hematocrit 39.9%, platelet count 478K. Previous CBC from 02/26/2017 demonstrated platelet count 424K. Prior to that CBC on 02/28/2016 and was treated a platelet count 399K. Patient's CRP was elevated at 9.3 back in July as well. Patient states that in July she had pulled a muscle under her left breast and was having a lot of pain and inflammation at that time, but currently her symptoms have resolved. Overall she states she feels great. She denies any chronic joint pains. She denies any chronic inflammatory disorders. She denies any chest pain, shortness breath, abdominal pain, recent infections, focal weakness.  Review of Systems - Oncology ROS as per HPI otherwise 12 point ROS is negative.  MEDICAL HISTORY: Past Medical History:  Diagnosis Date  . Adenocarcinoma of breast ([place])    left   . Heel spur   . Hyperlipidemia   . Hypertension   . Migraines   . Obesity     SURGICAL HISTORY: Past Surgical History:  Procedure Laterality Date  . BREAST SURGERY    . btl  1992  . CHOLECYSTECTOMY    . COLONOSCOPY  09/15/2011   Procedure: COLONOSCOPY;  Surgeon: Dorothyann Peng, MD;  Location: [facility];  Service: Endoscopy;  Laterality: N/A;  9:15 AM  . left lumpectomy and lymph node disection  2000  . r heel surgery for spur and chipped bone  11/09/20099  . TUBAL LIGATION      SOCIAL HISTORY: Social History   Social History  . Marital status: Married    Spouse name: N/A  . Number of children: N/A  . Years  of education: N/A   Occupational History  . Not on file.   Social History Main Topics  . Smoking status: Current Every Day Smoker    Packs/day: 0.25    Years: 15.00    Types: Cigarettes  . Smokeless tobacco: Never Used     Comment: smoking 3 per day  . Alcohol use No  . Drug use: No     Comment: pt states "3 cigarrettes per day"  . Sexual activity: Yes    Birth control/ protection: Post-menopausal   Other Topics Concern  . Not on file   Social History Narrative  . No narrative on file    FAMILY HISTORY Family History  Problem Relation Age of Onset  . Heart attack Mother   . Heart attack Father   . Glaucoma Father   . Hypertension Father   . Diabetes Sister   . Anesthesia problems Neg Hx   . Hypotension Neg Hx   . Malignant hyperthermia Neg Hx   . Pseudochol deficiency Neg Hx   . Migraines Neg Hx     ALLERGIES:  is allergic to ace inhibitors.  MEDICATIONS:  Current Outpatient Prescriptions  Medication Sig Dispense Refill  . amLODipine (NORVASC) 2.5 MG tablet TAKE 1 TABLET BY MOUTH ONCE DAILY 90 tablet 1  . Biotin 1000 MCG tablet Take 1,000 mcg by mouth daily.     . butalbital-acetaminophen-caffeine (FIORICET, ESGIC) 50-325-40 MG tablet   0  .  hydrochlorothiazide (HYDRODIURIL) 25 MG tablet TAKE 1 TABLET BY MOUTH DAILY 90 tablet 1  . ibuprofen (ADVIL,MOTRIN) 800 MG tablet TAKE 1 TABLET BY MOUTH ONCE DAILY AS NEEDED 30 tablet 2  . Loratadine 10 MG CAPS Take 1 capsule (10 mg total) by mouth daily. 90 each 1  . potassium chloride SA (K-DUR,KLOR-CON) 20 MEQ tablet Take 1 tablet (20 mEq total) by mouth daily. 90 tablet 1   No current facility-administered medications for this visit.     PHYSICAL EXAMINATION:   Vitals:   06/25/17 1510  BP: (!) 142/77  Pulse: (!) 102  Resp: 16  Temp: 98.4 F (36.9 C)  SpO2: 99%    Filed Weights   06/25/17 1510  Weight: 198 lb 8 oz (90 kg)     Physical Exam Constitutional: Well-developed, well-nourished, and in no  distress.   HENT:  Head: Normocephalic and atraumatic.  Mouth/Throat: No oropharyngeal exudate. Mucosa moist. Eyes: Pupils are equal, round, and reactive to light. Conjunctivae are normal. No scleral icterus.  Neck: Normal range of motion. Neck supple. No JVD present.  Cardiovascular: Normal rate, regular rhythm and normal heart sounds.  Exam reveals no gallop and no friction rub.   No murmur heard. Pulmonary/Chest: Effort normal and breath sounds normal. No respiratory distress. No wheezes.No rales.  Abdominal: Soft. Bowel sounds are normal. No distension. There is no tenderness. There is no guarding.  Musculoskeletal: No edema or tenderness.  Lymphadenopathy:    No cervical or supraclavicular adenopathy.  Neurological: Alert and oriented to person, place, and time. No cranial nerve deficit.  Skin: Skin is warm and dry. No rash noted. No erythema. No pallor.  Psychiatric: Affect and judgment normal.   LABORATORY DATA: I have personally reviewed the data as listed:  Office Visit on 06/04/2017  Component Date Value Ref Range Status  . Fecal Occult Blood, POC 06/04/2017 Negative  Negative Final    RADIOGRAPHIC STUDIES: I have personally reviewed the radiological images as listed and agree with the findings in the report  No results found.  ASSESSMENT/PLAN 1. Mild thrombocytosis-suspect it was reactive due to inflammation. We'll plan to repeat her CBC today. Also check her CRP and sedimentation rate as well as will her off her chronic inflammatory disorders with an ANA, rheumatoid factor, TSH. We'll rule out essential thrombocytosis by checking a JAK2 mutation. However I have very low suspicion that she has a myeloproliferative disorder.  2. Remote history of left breast cancer 12 years ago -Last bilateral screening mammogram in August 2018 was negative for malignancy.  RTC in 4 weeks to review labs and discuss next plan of care.  Orders Placed This Encounter  Procedures  .  C-reactive protein    Standing Status:   Future    Standing Expiration Date:   06/25/2018  . Sedimentation rate    Standing Status:   Future    Standing Expiration Date:   06/25/2018  . ANA, IFA (with reflex)  . TSH    Standing Status:   Future    Standing Expiration Date:   06/25/2018  . JAK2 V617F, w Reflex to CALR/E12/MPL  . Rheumatoid factor    Standing Status:   Future    Standing Expiration Date:   06/25/2018    All questions were answered. The patient knows to call the clinic with any problems, questions or concerns.  This note was electronically signed.    Twana First, MD  06/25/2017 3:26 PM

## 2017-06-25 NOTE — Patient Instructions (Signed)
[facility] at [facility] Discharge Instructions  RECOMMENDATIONS MADE BY THE CONSULTANT AND ANY TEST RESULTS WILL BE SENT TO YOUR REFERRING PHYSICIAN.  You were seen by Dr. Zhou today  Thank you for choosing [facility] at [facility] to provide your oncology and hematology care.  To afford each patient quality time with our provider, please arrive at least 15 minutes before your scheduled appointment time.    If you have a lab appointment with the Cancer Center please come in thru the  Main Entrance and check in at the main information desk  You need to re-schedule your appointment should you arrive 10 or more minutes late.  We strive to give you quality time with our providers, and arriving late affects you and other patients whose appointments are after yours.  Also, if you no show three or more times for appointments you may be dismissed from the clinic at the providers discretion.     Again, thank you for choosing [facility].  Our hope is that these requests will decrease the amount of time that you wait before being seen by our physicians.       _____________________________________________________________  Should you have questions after your visit to [facility], please contact our office at [phone] between the hours of 8:30 a.m. and 4:30 p.m.  Voicemails left after 4:30 p.m. will not be returned until the following business day.  For prescription refill requests, have your pharmacy contact our office.       Resources For Cancer Patients and their Caregivers ? American Cancer Society: Can assist with transportation, wigs, general needs, runs Look Good Feel Better.        1-888-227-6333 ? Cancer Care: Provides financial assistance, online support groups, medication/co-pay assistance.  1-800-813-HOPE (4673) ? [facility] Assists [place] cancer patients and their families  through emotional , educational and financial support.  336-427-4357 ? [place] DSS Where to apply for food stamps, Medicaid and utility assistance. 336-342-1394 ? [facility]: Transportation to medical appointments. 336-347-2287 ? Social Security Administration: May apply for disability if have a Stage IV cancer. 336-342-7796 1-800-772-1213 ? [place] [facility]ging, Disability and Transit Services: Assists with nutrition, care and transit needs. 336-349-2343  Cancer Center Support Programs: @10RELATIVEDAYS@ > Cancer Support Group  2nd Tuesday of the month 1pm-2pm, Journey Room  > Creative Journey  3rd Tuesday of the month 1130am-1pm, Journey Room  > Look Good Feel Better  1st Wednesday of the month 10am-12 noon, Journey Room (Call American Cancer Society to register 1-800-395-5775)   

## 2017-06-26 LAB — RHEUMATOID FACTOR: Rhuematoid fact SerPl-aCnc: <10 IU/mL (ref 0.0–13.9)

## 2017-06-26 LAB — ANTINUCLEAR ANTIBODIES, IFA: ANTINUCLEAR ANTIBODIES, IFA: NEGATIVE

## 2017-06-27 ENCOUNTER — Ambulatory Visit (HOSPITAL_COMMUNITY): Payer: 59

## 2017-07-04 MED FILL — HYDROCHLOROTHIAZIDE 25 MG T: 25 | 90 days supply | Qty: 90 | Fill #1

## 2017-07-04 MED FILL — IBUPROFEN 800 MG TABS: 800 | 30 days supply | Qty: 30 | Fill #2

## 2017-07-06 LAB — CALR + JAK2 E12-15 + MPL (REFLEXED)

## 2017-07-06 LAB — JAK2 V617F, W REFLEX TO CALR/E12/MPL

## 2017-07-24 ENCOUNTER — Encounter (HOSPITAL_BASED_OUTPATIENT_CLINIC_OR_DEPARTMENT_OTHER): Payer: 59 | Admitting: Oncology

## 2017-07-24 ENCOUNTER — Ambulatory Visit (HOSPITAL_COMMUNITY): Payer: 59

## 2017-07-24 ENCOUNTER — Encounter (HOSPITAL_COMMUNITY): Payer: Self-pay

## 2017-07-24 VITALS — BP 105/79 | HR 82 | Temp 98.3°F | Resp 16 | Ht 62.0 in | Wt 199.2 lb

## 2017-07-24 DIAGNOSIS — Z853 Personal history of malignant neoplasm of breast: Secondary | ICD-10-CM | POA: Diagnosis not present

## 2017-07-24 DIAGNOSIS — D75839 Thrombocytosis, unspecified: Secondary | ICD-10-CM

## 2017-07-24 DIAGNOSIS — D473 Essential (hemorrhagic) thrombocythemia: Secondary | ICD-10-CM | POA: Diagnosis not present

## 2017-07-24 NOTE — Progress Notes (Signed)
[facility] Initial Visit:  Patient Care Team: Fayrene Helper, MD as PCP - General  CHIEF COMPLAINTS/PURPOSE OF CONSULTATION:  Mild thrombocytosis  HISTORY OF PRESENTING ILLNESS: [name] [name] 56 y.o. female with remote history of breast cancer 12 years ago, treated with surgery, chemo, and RT presents today for evaluation of mild thrombocytosis. I do not have any recent bloodwork earlier CBC from 04/09/2017 demonstrated WBC 10.4 K, hemoglobin 13.7 K, hematocrit 39.9%, platelet count 478K. Previous CBC from 02/26/2017 demonstrated platelet count 424K. Prior to that CBC on 02/28/2016 and was treated a platelet count 399K. Patient's CRP was elevated at 9.3 back in July as well. Patient states that in July she had pulled a muscle under her left breast and was having a lot of pain and inflammation at that time, but currently her symptoms have resolved. Overall she states she feels great. She denies any chronic joint pains. She denies any chronic inflammatory disorders. She denies any chest pain, shortness breath, abdominal pain, recent infections, focal weakness.  INTERVAL HISTORY: Patient presented today for review of her thrombocytosis blood workup. She has no complaints today. She denies any headaches, blurry vision, fatigue chest pain, shortness breath, abdominal pain, recent infections, focal weakness.  Review of Systems - Oncology ROS as per HPI otherwise 12 point ROS is negative.  MEDICAL HISTORY: Past Medical History:  Diagnosis Date  . Adenocarcinoma of breast ([place])    left   . Heel spur   . Hyperlipidemia   . Hypertension   . Migraines   . Obesity     SURGICAL HISTORY: Past Surgical History:  Procedure Laterality Date  . BREAST SURGERY    . btl  1992  . CHOLECYSTECTOMY    . COLONOSCOPY  09/15/2011   Procedure: COLONOSCOPY;  Surgeon: Dorothyann Peng, MD;  Location: [facility];  Service: Endoscopy;  Laterality: N/A;  9:15 AM  . left lumpectomy  and lymph node disection  2000  . r heel surgery for spur and chipped bone  11/09/20099  . TUBAL LIGATION      SOCIAL HISTORY: Social History   Social History  . Marital status: Married    Spouse name: N/A  . Number of children: N/A  . Years of education: N/A   Occupational History  . Not on file.   Social History Main Topics  . Smoking status: Current Every Day Smoker    Packs/day: 0.25    Years: 15.00    Types: Cigarettes  . Smokeless tobacco: Never Used     Comment: smoking 3 per day  . Alcohol use No  . Drug use: No     Comment: pt states "3 cigarrettes per day"  . Sexual activity: Yes    Birth control/ protection: Post-menopausal   Other Topics Concern  . Not on file   Social History Narrative  . No narrative on file    FAMILY HISTORY Family History  Problem Relation Age of Onset  . Heart attack Mother   . Heart attack Father   . Glaucoma Father   . Hypertension Father   . Diabetes Sister   . Anesthesia problems Neg Hx   . Hypotension Neg Hx   . Malignant hyperthermia Neg Hx   . Pseudochol deficiency Neg Hx   . Migraines Neg Hx     ALLERGIES:  is allergic to ace inhibitors.  MEDICATIONS:  Current Outpatient Prescriptions  Medication Sig Dispense Refill  . amLODipine (NORVASC) 2.5 MG tablet TAKE 1  TABLET BY MOUTH ONCE DAILY 90 tablet 1  . Biotin 1000 MCG tablet Take 1,000 mcg by mouth daily.     . butalbital-acetaminophen-caffeine (FIORICET, ESGIC) 50-325-40 MG tablet   0  . hydrochlorothiazide (HYDRODIURIL) 25 MG tablet TAKE 1 TABLET BY MOUTH DAILY 90 tablet 1  . ibuprofen (ADVIL,MOTRIN) 800 MG tablet TAKE 1 TABLET BY MOUTH ONCE DAILY AS NEEDED 30 tablet 2  . Loratadine 10 MG CAPS Take 1 capsule (10 mg total) by mouth daily. 90 each 1  . potassium chloride SA (K-DUR,KLOR-CON) 20 MEQ tablet Take 1 tablet (20 mEq total) by mouth daily. 90 tablet 1   No current facility-administered medications for this visit.     PHYSICAL  EXAMINATION:   Vitals:   07/24/17 0850  BP: 105/79  Pulse: 82  Resp: 16  Temp: 98.3 F (36.8 C)  SpO2: 100%    Filed Weights   07/24/17 0850  Weight: 199 lb 3.2 oz (90.4 kg)     Physical Exam Constitutional: Well-developed, well-nourished, and in no distress.   HENT:  Head: Normocephalic and atraumatic.  Mouth/Throat: No oropharyngeal exudate. Mucosa moist. Eyes: Pupils are equal, round, and reactive to light. Conjunctivae are normal. No scleral icterus.  Neck: Normal range of motion. Neck supple. No JVD present.  Cardiovascular: Normal rate, regular rhythm and normal heart sounds.  Exam reveals no gallop and no friction rub.   No murmur heard. Pulmonary/Chest: Effort normal and breath sounds normal. No respiratory distress. No wheezes.No rales.  Abdominal: Soft. Bowel sounds are normal. No distension. There is no tenderness. There is no guarding.  Musculoskeletal: No edema or tenderness.  Lymphadenopathy:    No cervical or supraclavicular adenopathy.  Neurological: Alert and oriented to person, place, and time. No cranial nerve deficit.  Skin: Skin is warm and dry. No rash noted. No erythema. No pallor.  Psychiatric: Affect and judgment normal.   LABORATORY DATA: I have personally reviewed the data as listed:  Appointment on 06/25/2017  Component Date Value Ref Range Status  . CRP 06/25/2017 <0.8  <1.0 mg/dL Final   Performed at [facility] [address], [place], [place] [address]  . TSH 06/25/2017 1.233  0.350 - 4.500 uIU/mL Final   Performed by a 3rd Generation assay with a functional sensitivity of <=0.01 uIU/mL.  Marland Kitchen Rhuematoid fact SerPl-aCnc 06/25/2017 <10.0  0.0 - 13.9 IU/mL Final   Comment: (NOTE) Performed At: [facility] [place], [place] [id number] Lindon Romp MD [id number]   Office Visit on 06/25/2017  Component Date Value Ref Range Status  . ANA Ab, IFA 06/25/2017 Negative   Final   Comment: (NOTE)                                      Negative   <1:80                                     Borderline  1:80                                     Positive   >1:80 Performed At: [facility] [place], [place] [id number] Lindon Romp MD [id number]   . JAK2 GenotypR 06/25/2017 Comment  Final   Comment: (NOTE) Result: NEGATIVE for the JAK2 V617F mutation. Interpretation:  The G to T nucleotide change encoding the V617F mutation was not detected.  This result does not rule out the presence of the JAK2 mutation at a level below the sensitivity of detection of this assay, or the presence of other mutations within JAK2 not detected by this assay.  This result does not rule out a diagnosis of polycythemia vera, essential thrombocythemia or idiopathic myelofibrosis as the V617F mutation is not detected in all patients with these disorders.   Marland Kitchen BACKGROUND: 06/25/2017 Comment   Final   Comment: (NOTE) JAK2 is a cytoplasmic tyrosine kinase with a key role in signal transduction from multiple hematopoietic growth factor receptors. A point mutation within exon 14 of the JAK2 gene ([id number]) encoding a valine to phenylalanine substitution at position 617 of the JAK2 protein (V617F) has been identified in most patients with polycythemia vera, and in about half of those with either essential thrombocythemia or idiopathic myelofibrosis. The V617F has also been detected, although infrequently, in other myeloid disorders such as chronic myelomonocytic leukemia and chronic neutrophilic luekemia. V617F is an acquired mutation that alters a highly conserved valine present in the negative regulatory JH2 domain of the JAK2 protein and is predicted to dysregulate kinase activity. Methodology: Total genomic DNA was extracted and subjected to TaqMan real-time PCR amplification/detection. Two amplification products per sample were monitored by real-time PCR using primers/probes s                           pecific to JAK2 wild type (WT) and JAK2 mutant V617F. The ABI7900 Absolute Quantitation software will compare the patient specimen valuse to the standard curves and generate percent values for wild type and mutant type. In vitro studies have indicated that this assay has an analytical sensitivity of 1%. References: Baxter EJ, Scott Phineas Real, et al. Acquired mutation of the tyrosine kinase JAK2 in human myeloproliferative disorders. Lancet. 2005 Mar 19-25; 365(9464):1054-1061. Alfonso Ramus Couedic JP. A unique clonal JAK2 mutation leading to constitutive signaling causes polycythaemia vera. Nature. 2005 Apr 28; 434(7037):1144-1148. Kralovics R, Passamonti F, Buser AS, et al. A gain-of-function mutation of JAK2 in myeloproliferative disorders. N Engl J Med. 2005 Apr 28; 352(17):1779-1790.   . Director Review, JAK2 06/25/2017 Comment   Final   Comment: (NOTE) Katina Degree, MD, PhD Director, [facility] and [place], [place] [address] [phone] This test was developed and its performance characteristics determined by [facility]. It has not been cleared or approved by the Food and Drug Administration.   Marland Kitchen REFLEX: 06/25/2017 Comment   Final   Comment: (NOTE) Reflex to CALR Mutation Analysis, JAK2 Exon 12-15 Mutation Analysis, and MPL Mutation Analysis is indicated.   Marland Kitchen Extraction 06/25/2017 Completed   Corrected   Comment: (NOTE) Performed At: [facility] [address] [place], [place] [id number] Nechama Guard MD [id number] Performed At: [facility] [place], [place] [id number] Nechama Guard MD VZ:[id number]   . CALR Mutation Detection Result 06/25/2017 Comment   Final   Comment: (NOTE) NEGATIVE No insertions or deletions were detected within the analyzed region of the calreticulin (CALR) gene. A negative result does not entirely exclude the possibility  of a clonal population carrying CALR gene mutations that are not covered by this assay. Results should be interpreted in conjunction with clinical and laboratory findings  for the most accurate interpretation.   . Background: 06/25/2017 Comment   Final   Comment: (NOTE) The calcium-binding endoplasmic reticulin chaperone protein, calreticulin (CALR), is somatically mutated in approximately 70% of patients with JAK2-negative essential thrombocythemia (ET) and 60- 88% of patients with JAK2-negative primary myelofibrosis(PMF). Only a minority of patients (approximately 8%) with myelodysplasia have mutations in  CALR gene. CALR mutations are rarely detected in patients with de novo acute myeloid leukemia, chronic myelogenous leukemia, lymphoid leukemia, or solid tumors. CALR mutations are not detected in polycythemia and generally appear to be mutually exclusive with JAK2 mutations and MPL mutations. The majority of mutational changes involve a variety of insertion or deletion mutations in exon 9 of the calreticulin gene: approximately 53% of all CALR mutations are a 52 bp deletion (type-1) while the second most prevalent mutation (approximately 32%) contains a 5 bp insertion (type-2). Other mutations (non-type 1 or type 2) are seen                           in a small minority of cases. CALR mutations in PMF tend to be associated with a favorable prognosis compared to JAK2 V617F mutations, whereas primary myelofibrosis negative for CALR, JAK2 V617F and MPL mutations (so-called triple negative) is associated with a poor prognosis and shorter survival. The detection of a CALR gene mutation aids in the specific diagnosis of a myeloproliferative neoplasm, and help distinguish this clonal disease from a benign reactive process.   . Methodology: 06/25/2017 Comment   Final   Comment: (NOTE) Genomic DNA was isolated from the provided specimen. Polymerase chain reaction (PCR) of exon 9 of the  CALR gene was performed with specific fluorescent-labeled primers, and the PCR product was analyzed by capillary gel electrophoresis to determine the size of the PCR products. This PCR assay is capable of detecting a mutant cell population with a sensitivity of 5 mutant cells per 100 normal cells. A negative result does not exclude the presence of a myeloproliferative disorder or other neoplastic process. This test was developed and its performance characteristics determined by [facility]. It has not been cleared or approved by the Food and Drug Administration. The FDA has determined that such clearance or approval is not necessary.   . References: 06/25/2017 Comment   Final   Comment: (NOTE) 1. Klampfel, T. et al. (2013) Somatic mutations of calreticulin in   myeloproliferative neoplasms. New Engl. J. Med. 706:2376-2831. 2. Haynes Kerns et al. (2013) Somatic CALR mutations in   myeloproliferative neoplasms with nonmutated JAK2. [place] Engl. J.   Med. [phone].   Marland Kitchen Director Review 06/25/2017 Comment   Final   Comment: (NOTE) Constance Goltz, PhD, [facility]               Director, [facility] [place] [facility] [place], [place]   . JAK2 Exons 12-15 Mut Det PCR: 06/25/2017 Comment   Final   Comment: (NOTE) NEGATIVE JAK2 mutations were not detected in exons 12, 13, 14 and 15. This result does not rule out the presence of JAK2 mutation at a level below the detection sensitivity of this  assay, the presence of other mutations outside the analyzed region of the JAK2 gene, or the presence of a myeloproliferative or other neoplasm. Result must be correlated with other clinical data for the most accurate diagnosis.   . Indications 06/25/2017 [place]   Final  . Specimen Type 06/25/2017 Comment   Final   No specimen type provided.  Marland Kitchen BACKGROUND: 06/25/2017 Comment   Final    Comment: (NOTE) JAK2 V617F mutation is detected in patients with polycythemia vera (95%), essential thrombocythemia (50%) and primary myelofibrosis (50%). A small percentage of JAK2 mutation positive patients (3.3%) contain other non-V617F mutations within exons 12 to 15. The detection of a JAK2 gene mutation aids in the specific diagnosis of a myeloproliferative neoplasm, and help distinguish this clonal disease from a benign reactive process.   . Method 06/25/2017 Comment   Final   Comment: (NOTE) Total RNA was purified from the provided specimen. The JAK2 gene region covering exons 12 to 15 was subjected to reverse- transcription coupled PCR amplification, and bi-directional sequencing to identify sequence variations. This assay has a sensitivity to detect approximately 15% population of cells containing the JAK2 mutations in a background of non-mutant cells. This test was developed and its performance characteristics determined by [facility]. It has not been cleared or approved by the Food and Drug Administration.   . References 06/25/2017 Comment   Final   Comment: (NOTE) Algasham, N. et al. Detection of mutations in JAK2 exons 12-15 by Sanger sequencing. Int J Lab Hemato. 2015, 38:34-41. Joelene Millin al. Mutation profile of JAK2 transcripts in patients with chronic myeloproliferative neoplasias. J Mol Diagn. 2009, 11:49-53.   Marland Kitchen DIRECTOR REVIEW: 06/25/2017 Comment   Final   Comment: (NOTE) Loni Muse, PhD  Director, [facility]  Research [place], [place] [address]  [phone]   . MPL MUTATION ANALYSIS RESULT: 06/25/2017 Comment   Final   Comment: (NOTE) No MPL mutation was identified in the provided specimen of this individual. Results should be interpreted in conjunction with clinical and other laboratory findings for the most accurate interpretation.   Marland Kitchen BACKGROUND: 06/25/2017 Comment   Final   Comment: (NOTE) MPL  (myeloproliferative leukemia virus oncogene homology) belongs to the hematopoietin superfamily and enables its ligand thrombopoietin to facilitate both global hematopoiesis and megakaryocyte growth and differentiation. MPL W515 mutations are present in patients with primary myelofibrosis (PMF) and essential thrombocythemia (ET) at a frequency of approximately 5% and 1% respectively. The S505 mutation is detected in patients with hereditary thrombocythemia.   Marland Kitchen METHODOLOGY: 06/25/2017 Comment   Final   Comment: (NOTE) Genomic DNA was purified from the provided specimen. MPL gene region covering the S505N and W515L/K mutations were subjected to PCR amplification and bi-directional sequencing in duplicate to identify sequence variations. This assay has a sensitivity to detect approximately 20-25% population of cells containing the MPL mutations in a background of non-mutant cells. This assay will not detect the mutation below the sensitivity of this assay. Molecular- based testing is highly accurate, but as in any laboratory test, rare diagnostic errors may occur.   Marland Kitchen REFERENCES: 06/25/2017 Comment   Final   Comment: (NOTE) 1. Pardanani AD, et al. (2006). MPL515 mutations in   myeloproliferative and other myeloid disorders: a study   of 1182 patients. Blood 062:3762-8315. 2. Andre Lefort and Levine RL. (2008). JAK2 and MPL   mutations in myeloproliferative neoplasms: discovery and   science. Leukemia 22:1813-1817. 3. Juline Patch, et al. (2009). Evidence  for a founder effect   of the MPL-S505N mutation in eight [place] pedigrees with   hereditary thrombocythemia. Haematologica 94(10):1368-   4888.   Marland Kitchen DIRECTOR REVIEW: 06/25/2017 Comment   Final   Comment: (NOTE) Loni Muse, PhD  Director, [facility] and [place], [place] [address]  [phone] This test was developed and its performance characteristics determined by  [facility]. It has not been cleared or approved by the Food and Drug Administration.   . Extraction 06/25/2017 Comment   Final   Comment: (NOTE) This sample has been received and DNA extraction has been performed. Performed At: [facility] [address] [place], [place] [id number] Nechama Guard MD [id number] Performed At: [facility] [place], [place] [id number] Nechama Guard MD LJ:[id number]     RADIOGRAPHIC STUDIES: I have personally reviewed the radiological images as listed and agree with the findings in the report  No results found.  ASSESSMENT/PLAN 1. Mild thrombocytosis-Thrombocytosis workup has been negative. CRP/sedimentation rate, ANA, rheumatoid factor, TSH were all within normal limits. JAK2 mutation with reflex to exon 12, MPL, CALR were all negative as well. Recommend to continue observation of her platelet count at this time with repeat CBC in 4 months.  2. Remote history of left breast cancer 12 years ago -Last bilateral screening mammogram in August 2018 was negative for malignancy.  RTC in 4 months with CBC, CMP.  Orders Placed This Encounter  Procedures  . CBC with Differential    Standing Status:   Future    Standing Expiration Date:   07/24/2018  . Comprehensive metabolic panel    Standing Status:   Future    Standing Expiration Date:   07/24/2018    All questions were answered. The patient knows to call the clinic with any problems, questions or concerns.  This note was electronically signed.    Twana First, MD  07/24/2017 9:25 AM

## 2017-08-01 ENCOUNTER — Other Ambulatory Visit: Payer: Self-pay | Admitting: Family Medicine

## 2017-08-01 DIAGNOSIS — I10 Essential (primary) hypertension: Secondary | ICD-10-CM

## 2017-08-01 MED FILL — AMLODIPINE BESYLATE 2.5 MG: 2.5 | 90 days supply | Qty: 90 | Fill #0

## 2017-08-01 MED FILL — IBUPROFEN 800 MG TABS: 800 | 30 days supply | Qty: 30 | Fill #0

## 2017-08-01 MED FILL — POTASSIUM CL ER 20 MEQ TABL: 20 | 90 days supply | Qty: 90 | Fill #1

## 2017-10-01 ENCOUNTER — Other Ambulatory Visit: Payer: Self-pay | Admitting: Family Medicine

## 2017-10-01 MED FILL — IBUPROFEN 800 MG TABS: 800 | 30 days supply | Qty: 30 | Fill #1

## 2017-10-01 MED FILL — HYDROCHLOROTHIAZIDE 25 MG T: 25 | 90 days supply | Qty: 90 | Fill #0

## 2017-10-08 ENCOUNTER — Encounter: Payer: Self-pay | Admitting: Family Medicine

## 2017-10-08 ENCOUNTER — Ambulatory Visit: Payer: 59 | Admitting: Family Medicine

## 2017-10-08 VITALS — BP 120/82 | HR 95 | Resp 16 | Ht 62.0 in | Wt 196.0 lb

## 2017-10-08 DIAGNOSIS — F17218 Nicotine dependence, cigarettes, with other nicotine-induced disorders: Secondary | ICD-10-CM

## 2017-10-08 DIAGNOSIS — R7303 Prediabetes: Secondary | ICD-10-CM | POA: Diagnosis not present

## 2017-10-08 DIAGNOSIS — E559 Vitamin D deficiency, unspecified: Secondary | ICD-10-CM | POA: Diagnosis not present

## 2017-10-08 DIAGNOSIS — E7849 Other hyperlipidemia: Secondary | ICD-10-CM

## 2017-10-08 DIAGNOSIS — J302 Other seasonal allergic rhinitis: Secondary | ICD-10-CM

## 2017-10-08 DIAGNOSIS — I10 Essential (primary) hypertension: Secondary | ICD-10-CM | POA: Diagnosis not present

## 2017-10-08 NOTE — Assessment & Plan Note (Signed)
Improved. Patient re-educated about  the importance of commitment to a  minimum of 150 minutes of exercise per week.  The importance of healthy food choices with portion control discussed. Encouraged to start a food diary, count calories and to consider  joining a support group. Sample diet sheets offered. Goals set by the patient for the next several months.   Weight /BMI 10/08/2017 07/24/2017 06/25/2017  WEIGHT 196 lb 199 lb 3.2 oz 198 lb 8 oz  HEIGHT 5\' 2"  5\' 2"  -  BMI 35.85 kg/m2 36.43 kg/m2 36.31 kg/m2

## 2017-10-08 NOTE — Assessment & Plan Note (Signed)
Controlled, no change in medication DASH diet and commitment to daily physical activity for a minimum of 30 minutes discussed and encouraged, as a part of hypertension management. The importance of attaining a healthy weight is also discussed.  BP/Weight 10/08/2017 07/24/2017 06/25/2017 06/04/2017 04/09/2017 03/22/2017 9/38/1017  Systolic BP 510 258 527 782 423 536 144  Diastolic BP 82 79 77 88 77 72 80  Wt. (Lbs) 196 199.2 198.5 199.75 202 202 202  BMI 35.85 36.43 36.31 36.53 36.95 36.95 36.95

## 2017-10-08 NOTE — Assessment & Plan Note (Signed)
Patient educated about the importance of limiting  Carbohydrate intake , the need to commit to daily physical activity for a minimum of 30 minutes , and to commit weight loss. The fact that changes in all these areas will reduce or eliminate all together the development of diabetes is stressed.  Updated lab needed at/ before next visit.   Diabetic Labs Latest Ref Rng & Units 04/09/2017 02/26/2017 01/01/2017 05/18/2016 02/28/2016  HbA1c <5.7 % - - 5.7(H) 5.5 -  Chol <200 mg/dL - - 203(H) - -  HDL >50 mg/dL - - 60 - -  Calc LDL <100 mg/dL - - 123(H) - -  Triglycerides <150 mg/dL - - 100 - -  Creatinine 0.57 - 1.00 mg/dL 0.75 0.59 0.58 0.65 0.60   BP/Weight 10/08/2017 07/24/2017 06/25/2017 06/04/2017 04/09/2017 03/22/2017 0/80/2233  Systolic BP 612 244 975 300 511 021 117  Diastolic BP 82 79 77 88 77 72 80  Wt. (Lbs) 196 199.2 198.5 199.75 202 202 202  BMI 35.85 36.43 36.31 36.53 36.95 36.95 36.95   No flowsheet data found.

## 2017-10-08 NOTE — Assessment & Plan Note (Signed)

## 2017-10-08 NOTE — Assessment & Plan Note (Signed)
Controlled, no change in medication  

## 2017-10-08 NOTE — Progress Notes (Signed)
[name] [name]     MRN: [id number]      DOB: [birth year]   HPI [name] is here for follow up and re-evaluation of chronic medical conditions, medication management and review of any available recent lab and radiology data.  Preventive health is updated, specifically  Cancer screening and Immunization.   Questions or concerns regarding consultations or procedures which the PT has had in the interim are  addressed. The PT denies any adverse reactions to current medications since the last visit.  There are no new concerns. Plans to lose 6 to 10 pounds every 4 months There are no specific complaints   Needs to decide on quitting smoming altogether  ROS Denies recent fever or chills. Denies sinus pressure, nasal congestion, ear pain or sore throat. Denies chest congestion, productive cough or wheezing. Denies chest pains, palpitations and leg swelling Denies abdominal pain, nausea, vomiting,diarrhea or constipation.   Denies dysuria, frequency, hesitancy or incontinence. Denies uncontrolled  joint pain, swelling and limitation in mobility. Denies uncontrolled  Headaches, denies  seizures, numbness, or tingling. Denies depression, anxiety or insomnia. Denies skin break down or rash.   PE  BP 120/82   Pulse 95   Resp 16   Ht 5\' 2"  (1.575 m)   Wt 196 lb (88.9 kg)   SpO2 98%   BMI 35.85 kg/m   Patient alert and oriented and in no cardiopulmonary distress.  HEENT: No facial asymmetry, EOMI,   oropharynx pink and moist.  Neck supple no JVD, no mass.  Chest: Clear to auscultation bilaterally.  CVS: S1, S2 no murmurs, no S3.Regular rate.  ABD: Soft non tender.   Ext: No edema  MS: Adequate ROM spine, shoulders, hips and knees.  Skin: Intact, no ulcerations or rash noted.  Psych: Good eye contact, normal affect. Memory intact not anxious or depressed appearing.  CNS: CN 2-12 intact, power,  normal throughout.no focal deficits noted.   Assessment & Plan  Essential  hypertension Controlled, no change in medication DASH diet and commitment to daily physical activity for a minimum of 30 minutes discussed and encouraged, as a part of hypertension management. The importance of attaining a healthy weight is also discussed.  BP/Weight 10/08/2017 07/24/2017 06/25/2017 06/04/2017 04/09/2017 03/22/2017 2/35/5732  Systolic BP 202 542 706 237 628 315 176  Diastolic BP 82 79 77 88 77 72 80  Wt. (Lbs) 196 199.2 198.5 199.75 202 202 202  BMI 35.85 36.43 36.31 36.53 36.95 36.95 36.95       Allergic rhinitis Controlled, no change in medication   Obesity, Class II, BMI 35-39.9, with comorbidity Improved. Patient re-educated about  the importance of commitment to a  minimum of 150 minutes of exercise per week.  The importance of healthy food choices with portion control discussed. Encouraged to start a food diary, count calories and to consider  joining a support group. Sample diet sheets offered. Goals set by the patient for the next several months.   Weight /BMI 10/08/2017 07/24/2017 06/25/2017  WEIGHT 196 lb 199 lb 3.2 oz 198 lb 8 oz  HEIGHT 5\' 2"  5\' 2"  -  BMI 35.85 kg/m2 36.43 kg/m2 36.31 kg/m2      Vitamin D deficiency Updated lab needed at/ before next visit.   Prediabetes Patient educated about the importance of limiting  Carbohydrate intake , the need to commit to daily physical activity for a minimum of 30 minutes , and to commit weight loss. The fact that changes in all these areas  will reduce or eliminate all together the development of diabetes is stressed.  Updated lab needed at/ before next visit.   Diabetic Labs Latest Ref Rng & Units 04/09/2017 02/26/2017 01/01/2017 05/18/2016 02/28/2016  HbA1c <5.7 % - - 5.7(H) 5.5 -  Chol <200 mg/dL - - 203(H) - -  HDL >50 mg/dL - - 60 - -  Calc LDL <100 mg/dL - - 123(H) - -  Triglycerides <150 mg/dL - - 100 - -  Creatinine 0.57 - 1.00 mg/dL 0.75 0.59 0.58 0.65 0.60   BP/Weight 10/08/2017 07/24/2017  06/25/2017 06/04/2017 04/09/2017 03/22/2017 6/65/9935  Systolic BP 701 779 390 300 923 300 762  Diastolic BP 82 79 77 88 77 72 80  Wt. (Lbs) 196 199.2 198.5 199.75 202 202 202  BMI 35.85 36.43 36.31 36.53 36.95 36.95 36.95   No flowsheet data found.    Nicotine dependence Patient is asked and  confirms current  Nicotine use.  Five to seven minutes of time is spent in counseling the patient of the need to quit smoking  Advice to quit is delivered clearly specifically in reducing the risk of developing heart disease, having a stroke, or of developing all types of cancer, especially lung and oral cancer. Improvement in breathing and exercise tolerance and quality of life is also discussed, as is the economic benefit.  Assessment of willingness to quit or to make an attempt to quit is made and documented  Assistance in quit attempt is made with several and varied options presented, based on patient's desire and need. These include  literature, local classes available, 1800 QUIT NOW number, OTC and prescription medication.  The GOAL to be NICOTINE FREE is re emphasized.  The patient has set a personal goal of either reduction or discontinuation and follow up is arranged between 6 an 16 weeks.

## 2017-10-08 NOTE — Assessment & Plan Note (Signed)
Updated lab needed at/ before next visit.   

## 2017-10-08 NOTE — Patient Instructions (Addendum)
F/u in 4 months, call if you need me before  Fasting l lipid, chem 7 and EGFr and Vit D 3 to 5 days before next visit  Please work on changes in diet that yu have successfully started.  Continue and build on exercise at least 5 days per week   Commit to daily Vit D3 supplement of 2000 IU along with calcium '   Weight loss goal of 6 to 10 pounds   Aim to QUIT smoking in the next 4 months and stay quit , a lot of support is available  Thank you  for choosing [facility]. We consider it a privelige to serve you.  Delivering excellent health care in a caring and  compassionate way is our goal.  Partnering with you,  so that together we can achieve this goal is our strategy.      All the  Best for 2019!

## 2017-10-29 MED FILL — POTASSIUM CL ER 20 MEQ TABL: 20 | 90 days supply | Qty: 90 | Fill #2

## 2017-10-29 MED FILL — AMLODIPINE BESYLATE 2.5 MG: 2.5 | 90 days supply | Qty: 90 | Fill #1

## 2017-11-15 ENCOUNTER — Other Ambulatory Visit (HOSPITAL_COMMUNITY): Payer: 59

## 2017-11-22 ENCOUNTER — Ambulatory Visit (HOSPITAL_COMMUNITY): Payer: 59 | Admitting: Adult Health

## 2017-11-26 ENCOUNTER — Inpatient Hospital Stay (HOSPITAL_COMMUNITY): Payer: 59 | Attending: Adult Health

## 2017-11-26 DIAGNOSIS — Z79899 Other long term (current) drug therapy: Secondary | ICD-10-CM | POA: Diagnosis not present

## 2017-11-26 DIAGNOSIS — Z853 Personal history of malignant neoplasm of breast: Secondary | ICD-10-CM | POA: Diagnosis not present

## 2017-11-26 DIAGNOSIS — D473 Essential (hemorrhagic) thrombocythemia: Secondary | ICD-10-CM | POA: Diagnosis not present

## 2017-11-26 DIAGNOSIS — I10 Essential (primary) hypertension: Secondary | ICD-10-CM | POA: Insufficient documentation

## 2017-11-26 DIAGNOSIS — D75839 Thrombocytosis, unspecified: Secondary | ICD-10-CM

## 2017-11-26 DIAGNOSIS — E876 Hypokalemia: Secondary | ICD-10-CM | POA: Diagnosis not present

## 2017-11-26 LAB — COMPREHENSIVE METABOLIC PANEL
ALBUMIN: 3.9 g/dL (ref 3.5–5.0)
ALT: 13 U/L — AB (ref 14–54)
AST: 17 U/L (ref 15–41)
Alkaline Phosphatase: 67 U/L (ref 38–126)
Anion gap: 10 (ref 5–15)
BUN: 13 mg/dL (ref 6–20)
CHLORIDE: 103 mmol/L (ref 101–111)
CO2: 25 mmol/L (ref 22–32)
Calcium: 9.4 mg/dL (ref 8.9–10.3)
Creatinine, Ser: 0.56 mg/dL (ref 0.44–1.00)
GFR calc Af Amer: >60 mL/min (ref >60)
GFR calc non Af Amer: >60 mL/min (ref >60)
GLUCOSE: 92 mg/dL (ref 65–99)
POTASSIUM: 3.6 mmol/L (ref 3.5–5.1)
SODIUM: 138 mmol/L (ref 135–145)
Total Bilirubin: 0.3 mg/dL (ref 0.3–1.2)
Total Protein: 7.4 g/dL (ref 6.5–8.1)

## 2017-11-26 LAB — CBC WITH DIFFERENTIAL/PLATELET
BASOS ABS: 0 10*3/uL (ref 0.0–0.1)
BASOS PCT: 1 %
EOS PCT: 4 %
Eosinophils Absolute: 0.3 10*3/uL (ref 0.0–0.7)
HCT: 41.9 % (ref 36.0–46.0)
Hemoglobin: 13.6 g/dL (ref 12.0–15.0)
Lymphocytes Relative: 37 %
Lymphs Abs: 2.9 10*3/uL (ref 0.7–4.0)
MCH: 29.8 pg (ref 26.0–34.0)
MCHC: 32.5 g/dL (ref 30.0–36.0)
MCV: 91.9 fL (ref 78.0–100.0)
MONO ABS: 0.6 10*3/uL (ref 0.1–1.0)
Monocytes Relative: 7 %
Neutro Abs: 4.1 10*3/uL (ref 1.7–7.7)
Neutrophils Relative %: 51 %
PLATELETS: 438 10*3/uL — AB (ref 150–400)
RBC: 4.56 MIL/uL (ref 3.87–5.11)
RDW: 13.9 % (ref 11.5–15.5)
WBC: 7.8 10*3/uL (ref 4.0–10.5)

## 2017-11-29 MED FILL — IBUPROFEN 800 MG TAB: 800 | 30 days supply | Qty: 30 | Fill #2

## 2017-12-03 ENCOUNTER — Encounter (HOSPITAL_COMMUNITY): Payer: Self-pay | Admitting: Internal Medicine

## 2017-12-03 ENCOUNTER — Inpatient Hospital Stay (HOSPITAL_COMMUNITY): Payer: 59 | Attending: Adult Health | Admitting: Internal Medicine

## 2017-12-03 ENCOUNTER — Other Ambulatory Visit (HOSPITAL_COMMUNITY): Payer: Self-pay | Admitting: Internal Medicine

## 2017-12-03 VITALS — BP 136/77 | HR 90 | Temp 98.8°F | Resp 18 | Wt 192.0 lb

## 2017-12-03 DIAGNOSIS — Z853 Personal history of malignant neoplasm of breast: Secondary | ICD-10-CM | POA: Diagnosis not present

## 2017-12-03 DIAGNOSIS — E876 Hypokalemia: Secondary | ICD-10-CM | POA: Insufficient documentation

## 2017-12-03 DIAGNOSIS — Z1231 Encounter for screening mammogram for malignant neoplasm of breast: Secondary | ICD-10-CM

## 2017-12-03 DIAGNOSIS — I10 Essential (primary) hypertension: Secondary | ICD-10-CM | POA: Diagnosis not present

## 2017-12-03 DIAGNOSIS — D473 Essential (hemorrhagic) thrombocythemia: Secondary | ICD-10-CM | POA: Insufficient documentation

## 2017-12-03 DIAGNOSIS — Z79899 Other long term (current) drug therapy: Secondary | ICD-10-CM | POA: Diagnosis not present

## 2017-12-03 DIAGNOSIS — D75839 Thrombocytosis, unspecified: Secondary | ICD-10-CM

## 2017-12-03 NOTE — Patient Instructions (Signed)
Screen mammogram scheduled in August, patient to return to cancer center for follow up after mammogram and continue to follow up yearly.

## 2017-12-13 ENCOUNTER — Emergency Department (HOSPITAL_COMMUNITY): Payer: 59

## 2017-12-13 ENCOUNTER — Telehealth: Payer: Self-pay

## 2017-12-13 ENCOUNTER — Emergency Department (HOSPITAL_COMMUNITY)
Admission: EM | Admit: 2017-12-13 | Discharge: 2017-12-13 | Disposition: A | Discharge status: Home / Self Care | Payer: 59 | Attending: Emergency Medicine | Admitting: Emergency Medicine

## 2017-12-13 ENCOUNTER — Encounter (HOSPITAL_COMMUNITY): Payer: Self-pay

## 2017-12-13 DIAGNOSIS — Z853 Personal history of malignant neoplasm of breast: Secondary | ICD-10-CM | POA: Diagnosis not present

## 2017-12-13 DIAGNOSIS — M79602 Pain in left arm: Secondary | ICD-10-CM | POA: Insufficient documentation

## 2017-12-13 DIAGNOSIS — G43409 Hemiplegic migraine, not intractable, without status migrainosus: Secondary | ICD-10-CM | POA: Insufficient documentation

## 2017-12-13 DIAGNOSIS — F17210 Nicotine dependence, cigarettes, uncomplicated: Secondary | ICD-10-CM | POA: Insufficient documentation

## 2017-12-13 DIAGNOSIS — I10 Essential (primary) hypertension: Secondary | ICD-10-CM | POA: Insufficient documentation

## 2017-12-13 DIAGNOSIS — H539 Unspecified visual disturbance: Secondary | ICD-10-CM | POA: Diagnosis not present

## 2017-12-13 DIAGNOSIS — R51 Headache: Secondary | ICD-10-CM | POA: Diagnosis present

## 2017-12-13 DIAGNOSIS — R200 Anesthesia of skin: Secondary | ICD-10-CM | POA: Diagnosis not present

## 2017-12-13 DIAGNOSIS — Z79899 Other long term (current) drug therapy: Secondary | ICD-10-CM | POA: Insufficient documentation

## 2017-12-13 LAB — I-STAT CHEM 8, ED
BUN: 16 mg/dL (ref 6–20)
BUN: 15 mg/dL (ref 6–20)
CALCIUM ION: 1.21 mmol/L (ref 1.15–1.40)
CALCIUM ION: 1.19 mmol/L (ref 1.15–1.40)
CREATININE: 0.6 mg/dL (ref 0.44–1.00)
Chloride: 104 mmol/L (ref 101–111)
Chloride: 103 mmol/L (ref 101–111)
Creatinine, Ser: 0.5 mg/dL (ref 0.44–1.00)
GLUCOSE: 91 mg/dL (ref 65–99)
Glucose, Bld: 92 mg/dL (ref 65–99)
HCT: 40 % (ref 36.0–46.0)
HEMATOCRIT: 43 % (ref 36.0–46.0)
HEMOGLOBIN: 14.6 g/dL (ref 12.0–15.0)
HEMOGLOBIN: 13.6 g/dL (ref 12.0–15.0)
Potassium: 3.4 mmol/L — ABNORMAL LOW (ref 3.5–5.1)
Potassium: 3.4 mmol/L — ABNORMAL LOW (ref 3.5–5.1)
Sodium: 143 mmol/L (ref 135–145)
Sodium: 142 mmol/L (ref 135–145)
TCO2: 26 mmol/L (ref 22–32)
TCO2: 25 mmol/L (ref 22–32)

## 2017-12-13 LAB — COMPREHENSIVE METABOLIC PANEL
ALT: 15 U/L (ref 14–54)
AST: 16 U/L (ref 15–41)
Albumin: 3.9 g/dL (ref 3.5–5.0)
Alkaline Phosphatase: 66 U/L (ref 38–126)
Anion gap: 12 (ref 5–15)
BILIRUBIN TOTAL: 0.5 mg/dL (ref 0.3–1.2)
BUN: 17 mg/dL (ref 6–20)
CALCIUM: 9.4 mg/dL (ref 8.9–10.3)
CO2: 25 mmol/L (ref 22–32)
Chloride: 101 mmol/L (ref 101–111)
Creatinine, Ser: 0.59 mg/dL (ref 0.44–1.00)
Glucose, Bld: 95 mg/dL (ref 65–99)
POTASSIUM: 3.3 mmol/L — AB (ref 3.5–5.1)
Sodium: 138 mmol/L (ref 135–145)
TOTAL PROTEIN: 7.4 g/dL (ref 6.5–8.1)

## 2017-12-13 LAB — DIFFERENTIAL
Basophils Absolute: 0 10*3/uL (ref 0.0–0.1)
Basophils Relative: 0 %
EOS ABS: 0.3 10*3/uL (ref 0.0–0.7)
EOS PCT: 3 %
LYMPHS ABS: 3.5 10*3/uL (ref 0.7–4.0)
Lymphocytes Relative: 43 %
MONO ABS: 0.4 10*3/uL (ref 0.1–1.0)
Monocytes Relative: 5 %
NEUTROS PCT: 49 %
Neutro Abs: 4 10*3/uL (ref 1.7–7.7)

## 2017-12-13 LAB — APTT: aPTT: 33 seconds (ref 24–36)

## 2017-12-13 LAB — CBC
HCT: 42.1 % (ref 36.0–46.0)
HEMOGLOBIN: 13.6 g/dL (ref 12.0–15.0)
MCH: 30 pg (ref 26.0–34.0)
MCHC: 32.3 g/dL (ref 30.0–36.0)
MCV: 92.7 fL (ref 78.0–100.0)
Platelets: 473 10*3/uL — ABNORMAL HIGH (ref 150–400)
RBC: 4.54 MIL/uL (ref 3.87–5.11)
RDW: 14 % (ref 11.5–15.5)
WBC: 8.2 10*3/uL (ref 4.0–10.5)

## 2017-12-13 LAB — CBG MONITORING, ED: GLUCOSE-CAPILLARY: 99 mg/dL (ref 65–99)

## 2017-12-13 LAB — I-STAT TROPONIN, ED: TROPONIN I, POC: 0 ng/mL (ref 0.00–0.08)

## 2017-12-13 LAB — ETHANOL: Alcohol, Ethyl (B): <10 mg/dL (ref <10)

## 2017-12-13 LAB — PROTIME-INR
INR: 0.99
Prothrombin Time: 13 seconds (ref 11.4–15.2)

## 2017-12-13 LAB — I-STAT BETA HCG BLOOD, ED (MC, WL, AP ONLY): I-stat hCG, quantitative: <5 m[IU]/mL (ref <5)

## 2017-12-13 MED ORDER — DIPHENHYDRAMINE HCL 50 MG/ML IJ SOLN
25.0000 mg | Freq: Once | INTRAMUSCULAR | Status: AC
  Administered 2017-12-13: 25 mg via INTRAVENOUS
  Filled 2017-12-13: qty 1

## 2017-12-13 MED ORDER — METOCLOPRAMIDE HCL 5 MG/ML IJ SOLN
10.0000 mg | Freq: Once | INTRAMUSCULAR | Status: AC
  Administered 2017-12-13: 10 mg via INTRAVENOUS
  Filled 2017-12-13: qty 2

## 2017-12-13 MED ORDER — KETOROLAC TROMETHAMINE 30 MG/ML IJ SOLN
30.0000 mg | Freq: Once | INTRAMUSCULAR | Status: AC
  Administered 2017-12-13: 30 mg via INTRAVENOUS
  Filled 2017-12-13: qty 1

## 2017-12-13 NOTE — Consult Note (Signed)
[facility] Tele[facility]urology Consult Services  Impression: numbness. Per history, seems more like migraine with aura, but given she still has some mild focal deficits, cannot r/o stroke at this time   Not a tpa candidate due to: mild non disabling symptoms Does not meet LVO screening criteria (no current aphasia, neglect, gaze deviation, dense hemiparesis, visual field deficits on exam); therefore, advanced imaging not recommended.   Differential Diagnosis:   1. Cardioembolic stroke  2. Small vessel disease/lacune  3. Thromboembolic, artery-to-artery mechanism  4. Hypercoagulable state-related infarct  5. Transient ischemic attack  6. Thrombotic mechanism, large artery disease   Comments:   [facility] contacted: [phone] [facility] at bedside:  [phone] NIHSS assessment time:  928  Recommendations:  Admit streke/telemetry Neuro checks Dvt prophy Dysphagia screen Head of bed flat Iv fluids ns Asa if no contraindications Migraine cocktail for headache  inpatient neurology consultation Inpatient stroke evaluation as per Neurology/ Internal Medicine Discussed with ED MD  -----------------------------------------------------------------------------------------  CC numbness, vision problems  History of Present Illness   Patient is a  57 year old woman who comes to the ED for left sided numbness/tingling and visual field cut on the left side. She was working at 730am and noticed she could not see things well from the left side. This resolved after 30 mins or so and then she began to have numbness/tingling that traveled from face to arm and then the left on the left side. She also has 8/10 headache. Per patient, this has happened before, with her migraines.  Same side and same deficits, she does not see anything different from the current situation and her migraines. She took an asa when symptoms started. Denies any weakness and difficulty walking. She currently has some mild  tingling on the left face and arm, but the leg has resolved.  Diagnostic: Ct head without contrast: no acute findings per rad read  Exam:  NIHSS score: 1 1A: Level of Consciousness - Alert; keenly responsive 1B: Ask Month and Age - Both Questions Right 1C: 'Blink Eyes' & 'Squeeze Hands' - Performs Both Tasks 2: Test Horizontal Extraocular Movements - Normal 3: Test Visual Fields - No Visual Loss 4: Test Facial Palsy - Normal symmetry 5A: Test Left Arm Motor Drift - No Drift for 10 Seconds 5B: Test Right Arm Motor Drift - No Drift for 10 Seconds 6A: Test Left Leg Motor Drift - No Drift for 5 Seconds 6B: Test Right Leg Motor Drift - No Drift for 5 Seconds 7: Test Limb Ataxia - No Ataxia 8: Test Sensation - Mild-Moderate Loss: Less Sharp/More Dull 9: Test Language/Aphasia - Normal; No aphasia 10: Test Dysarthria - Normal 11: Test Extinction/Inattention - No abnormality      Medical Decision Making:  - Extensive number of diagnosis or management options are considered above.   - Extensive amount of complex data reviewed.   - High risk of complication and/or morbidity or mortality are associated with differential diagnostic considerations above.  - There may be Uncertain outcome and increased probability of prolonged functional impairment or high probability of severe prolonged functional impairment associated with some of these differential diagnosis.  Medical Data Reviewed:  1.Data reviewed include clinical labs, radiology,  Medical Tests;   2.Tests results discussed w/performing or interpreting physician;   3.Obtaining/reviewing old medical records;  4.Obtaining case history from another source;  5.Independent review of image, tracing or specimen.    Patient was informed the Neurology Consult would happen via telehealth (remote video) and consented to receiving care  in this manner.

## 2017-12-13 NOTE — ED Provider Notes (Signed)
[facility] EMERGENCY DEPARTMENT Provider Note   CSN: [id number] Arrival date & time: 12/13/17  8338   An emergency department physician performed an initial assessment on this suspected stroke patient at 57.  History   Chief Complaint Chief Complaint  Patient presents with  . Code Stroke    HPI [name] [name] is a 57 y.o. female.  Chief complaint is numbness and vision changes.  HPI: [name] [name] is a 57 year old female.  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  She awakened this morning normal.  She came to work at 7.  Between 730 and 8 she was doing her schedule.  She developed a feeling of numbness on the left side of her face and tongue, left arm, and ultimately left leg.  This was in a stepwise fashion.  She then developed difficulty with vision to her left visual fields.  She did not initially have headache.  However, ultimately did developed right-sided headache in the emergency room.  History of migraine headaches with hemiparetic symptoms.  No history of stroke.  Has history of hemiparetic migraine, hypertension.  Hypertensive meds include amlodipine, and hydrochlorothiazide.  Past Medical History:  Diagnosis Date  . Adenocarcinoma of breast ([place])    left   . Heel spur   . Hyperlipidemia   . Hypertension   . Migraines   . Obesity     Patient Active Problem List   Diagnosis Date Noted  . Thrombocytosis ([place]) 06/25/2017  . Annual physical exam 05/17/2015  . Metabolic syndrome X 25/01/3975  . Nicotine dependence 06/30/2013  . Vitamin D deficiency 08/12/2012  . Prediabetes 04/08/2012  . Allergic rhinitis 01/21/2008  . Malignant neoplasm of breast (female) ([place]) 01/17/2008  . Hyperlipemia 01/17/2008  . Obesity, Class II, BMI 35-39.9, with comorbidity 01/17/2008  . Essential hypertension 01/17/2008    Past Surgical History:  Procedure Laterality Date  . BREAST SURGERY    . btl  1992  . CHOLECYSTECTOMY    . COLONOSCOPY  09/15/2011   Procedure: COLONOSCOPY;   Surgeon: Dorothyann Peng, MD;  Location: [facility];  Service: Endoscopy;  Laterality: N/A;  9:15 AM  . left lumpectomy and lymph node disection  2000  . r heel surgery for spur and chipped bone  11/09/20099  . TUBAL LIGATION      OB History    Gravida  3   Para  3   Term  2   Preterm      AB      Living  2     SAB      TAB      Ectopic      Multiple      Live Births           Obstetric Comments  Baby passed 10 min after being born.         Home Medications    Prior to Admission medications   Medication Sig Start Date End Date Taking? Authorizing Provider  amLODipine (NORVASC) 2.5 MG tablet TAKE 1 TABLET BY MOUTH ONCE DAILY 08/01/17  Yes Fayrene Helper, MD  Biotin 1000 MCG tablet Take 1,000 mcg by mouth daily.    Yes [provider]  fluticasone (FLONASE) 50 MCG/ACT nasal spray Place 1 spray into both nostrils daily.   Yes [provider]  hydrochlorothiazide (HYDRODIURIL) 25 MG tablet TAKE 1 TABLET BY MOUTH DAILY 10/01/17  Yes Fayrene Helper, MD  ibuprofen (ADVIL,MOTRIN) 800 MG tablet TAKE 1 TABLET BY MOUTH ONCE  DAILY AS NEEDED 08/01/17  Yes Fayrene Helper, MD  potassium chloride SA (K-DUR,KLOR-CON) 20 MEQ tablet Take 1 tablet (20 mEq total) by mouth daily. 04/30/17  Yes Fayrene Helper, MD    Family History Family History  Problem Relation Age of Onset  . Heart attack Mother   . Heart attack Father   . Glaucoma Father   . Hypertension Father   . Diabetes Sister   . Anesthesia problems Neg Hx   . Hypotension Neg Hx   . Malignant hyperthermia Neg Hx   . Pseudochol deficiency Neg Hx   . Migraines Neg Hx     Social History Social History   Tobacco Use  . Smoking status: Current Every Day Smoker    Packs/day: 0.25    Years: 15.00    Pack years: 3.75    Types: Cigarettes  . Smokeless tobacco: Never Used  . Tobacco comment: smoking 3 per day  Substance Use Topics  . Alcohol use: No    Alcohol/week: 0.0 oz  .  Drug use: No    Comment: pt states "3 cigarrettes per day"     Allergies   Ace inhibitors   Review of Systems Review of Systems  Constitutional: Negative for appetite change, chills, diaphoresis, fatigue and fever.  HENT: Negative for mouth sores, sore throat and trouble swallowing.   Eyes: Positive for visual disturbance.  Respiratory: Negative for cough, chest tightness, shortness of breath and wheezing.   Cardiovascular: Negative for chest pain.  Gastrointestinal: Negative for abdominal distention, abdominal pain, diarrhea, nausea and vomiting.  Endocrine: Negative for polydipsia, polyphagia and polyuria.  Genitourinary: Negative for dysuria, frequency and hematuria.  Musculoskeletal: Negative for gait problem.  Skin: Negative for color change, pallor and rash.  Neurological: Positive for numbness and headaches. Negative for dizziness, syncope and light-headedness.  Hematological: Does not bruise/bleed easily.  Psychiatric/Behavioral: Negative for behavioral problems and confusion.     Physical Exam Updated Vital Signs BP 126/82 (BP Location: Left Arm)   Pulse 82   Temp 98.2 F (36.8 C) (Oral)   Resp 18   Ht 5\' 2"  (1.575 m)   Wt 87.1 kg (192 lb)   SpO2 96%   BMI 35.12 kg/m   Physical Exam  Constitutional: She appears well-developed and well-nourished. No distress.  HENT:  Head: Normocephalic.  Eyes: Pupils are equal, round, and reactive to light. Conjunctivae are normal. No scleral icterus.  Neck: Normal range of motion. Neck supple. No thyromegaly present.  Cardiovascular: Normal rate and regular rhythm. Exam reveals no gallop and no friction rub.  No murmur heard. Pulmonary/Chest: Effort normal and breath sounds normal. No respiratory distress. She has no wheezes. She has no rales.  Abdominal: Soft. Bowel sounds are normal. She exhibits no distension. There is no tenderness. There is no rebound.  Musculoskeletal: Normal range of motion.  Neurological:  She  states that her symptoms have improved.  On exam her NIH is 0.  She does not have demonstrable visual field cut.  However she indicates that previously she had had a left hemianopsia.  She reports decreased sensation to her arm and leg but has normal strength  Skin: Skin is warm and dry. No rash noted.  Psychiatric: She has a normal mood and affect. Her behavior is normal.     ED Treatments / Results  Labs (all labs ordered are listed, but only abnormal results are displayed) Labs Reviewed  CBC - Abnormal; Notable for the following components:  Result Value   Platelets 473 (*)    All other components within normal limits  COMPREHENSIVE METABOLIC PANEL - Abnormal; Notable for the following components:   Potassium 3.3 (*)    All other components within normal limits  I-STAT CHEM 8, ED - Abnormal; Notable for the following components:   Potassium 3.4 (*)    All other components within normal limits  I-STAT CHEM 8, ED - Abnormal; Notable for the following components:   Potassium 3.4 (*)    All other components within normal limits  ETHANOL  PROTIME-INR  APTT  DIFFERENTIAL  RAPID URINE DRUG SCREEN, HOSP PERFORMED  URINALYSIS, ROUTINE W REFLEX MICROSCOPIC  CBG MONITORING, ED  I-STAT TROPONIN, ED  I-STAT BETA HCG BLOOD, ED ([facility], [facility], [facility] ONLY)    EKG  EKG Interpretation  Date/Time:  Thursday December 13 2017 09:16:27 EDT Ventricular Rate:  86 PR Interval:    QRS Duration: 88 QT Interval:  378 QTC Calculation: 453 R Axis:   46 Text Interpretation:  Sinus rhythm Borderline T abnormalities, inferior leads Confirmed by Tanna Furry [phone]) on 12/13/2017 9:19:38 AM       Radiology Mr Brain Wo Contrast  Result Date: 12/13/2017 CLINICAL DATA:  57 year old female with left side face and body numbness with visual changes while at work today. EXAM: MRI HEAD WITHOUT CONTRAST TECHNIQUE: Multiplanar, multiecho pulse sequences of the brain and surrounding structures were obtained without  intravenous contrast. COMPARISON:  Head CT without contrast 0922 hours today. Brain MRI 02/23/2016 and earlier. FINDINGS: Brain: No restricted diffusion to suggest acute infarction. No midline shift, mass effect, evidence of mass lesion, ventriculomegaly, extra-axial collection or acute intracranial hemorrhage. Cervicomedullary junction and pituitary are within normal limits. Cerebral volume remains stable and within normal limits. Chronic bilateral cerebral white matter T2 and FLAIR hyperintensity with confluent periatrial involvement and widely scattered central and subcortical white matter involvement elsewhere. The corpus callosum appears normal. Stable gray and white matter signal throughout the brain. No cortical encephalomalacia or chronic cerebral blood products. The bilateral deep gray matter nuclei, brainstem, and cerebellum remain normal. Vascular: Major intracranial vascular flow voids are stable. Skull and upper cervical spine: Negative visible cervical spine. Visualized bone marrow signal is within normal limits. Sinuses/Orbits: Orbits soft tissues appear stable and normal. Paranasal Visualized paranasal sinuses and mastoids are stable and well pneumatized. Other: Visible internal auditory structures appear normal. Scalp and face soft tissues appear negative. IMPRESSION: 1.  No acute intracranial abnormality. 2. Continued stable noncontrast MRI appearance the brain over study since 2016. The patient has chronically advanced but nonspecific cerebral white matter signal changes, theorized in 2016 to perhaps be related to prior breast cancer treatment. Electronically Signed   By: Genevie Ann M.D.   On: 12/13/2017 11:20   Ct Head Code Stroke Wo Contrast  Result Date: 12/13/2017 CLINICAL DATA:  Code stroke. Left-sided weakness. Pain behind left eye. EXAM: CT HEAD WITHOUT CONTRAST TECHNIQUE: Contiguous axial images were obtained from the base of the skull through the vertex without intravenous contrast.  COMPARISON:  MRI head 02/23/2016 FINDINGS: Brain: Ventricle size and cerebral volume normal. Patchy hypodensity in the frontal and parietal white matter bilaterally similar to the prior MRI. Negative for acute infarct. Negative for acute hemorrhage or mass. Vascular: Negative for hyperdense vessel Skull: Negative Sinuses/Orbits: Negative Other: None ASPECTS ([place] Stroke Program Early CT Score) - Ganglionic level infarction (caudate, lentiform nuclei, internal capsule, insula, M1-M3 cortex): 7 - Supraganglionic infarction (M4-M6 cortex): 3 Total score (0-10 with 10  being normal): 10 IMPRESSION: 1. No acute intracranial abnormality 2. Chronic white matter changes stable from the prior MRI 3. ASPECTS is 10 4. These results were called by telephone at the time of interpretation on 12/13/2017 at 9:34 am to Dr. Tanna Furry , who verbally acknowledged these results. Electronically Signed   By: Franchot Gallo M.D.   On: 12/13/2017 09:35    Procedures Procedures (including critical care time)  Medications Ordered in ED Medications  metoCLOPramide (REGLAN) injection 10 mg (10 mg Intravenous Given 12/13/17 1111)  ketorolac (TORADOL) 30 MG/ML injection 30 mg (30 mg Intravenous Given 12/13/17 1111)  diphenhydrAMINE (BENADRYL) injection 25 mg (25 mg Intravenous Given 12/13/17 1112)     Initial Impression / Assessment and Plan / ED Course  I have reviewed the triage vital signs and the nursing notes.  Pertinent labs & imaging results that were available during my care of the patient were reviewed by me and considered in my medical decision making (see chart for details).    Code stroke initiated upon patient's arrival due to patient being within the window for acute lytic therapy and reporting hemianopsia and left sided numbness.  Her symptoms improve and at time of neurological exam with consultant.  Symptoms have resolved.  Migraine type headache has begun as well.  CT is negative.  MRI is negative.  Patient  given migraine cocktail and her symptoms have improved and resolved.  Recommendation per neurology was admission however with caveat that admission will be required for completion of stroke evaluation.  He stated to me that his diagnosis was likely migraine headache.  I discussed this at length with the patient.  She politely declines admission.  Is feeling more and more like this is consistent with her previous migraine syndrome.  She is discharged home to follow-up with primary care.  CRITICAL CARE Performed by: Lolita Patella   Total critical care time: 30 minutes  Critical care time was exclusive of separately billable procedures and treating other patients.  Critical care was necessary to treat or prevent imminent or life-threatening deterioration.  Critical care was time spent personally by me on the following activities: development of treatment plan with patient and/or surrogate as well as nursing, discussions with consultants, evaluation of patient's response to treatment, examination of patient, obtaining history from patient or surrogate, ordering and performing treatments and interventions, ordering and review of laboratory studies, ordering and review of radiographic studies, pulse oximetry and re-evaluation of patient's condition.   EKG Interpretation  Date/Time:  Thursday December 13 2017 09:16:27 EDT Ventricular Rate:  86 PR Interval:    QRS Duration: 88 QT Interval:  378 QTC Calculation: 453 R Axis:   46 Text Interpretation:  Sinus rhythm Borderline T abnormalities, inferior leads Confirmed by Tanna Furry [phone]) on 12/13/2017 9:19:38 AM   Final Clinical Impressions(s) / ED Diagnoses   Final diagnoses:  Hemiplegic migraine without status migrainosus, not intractable    ED Discharge Orders    None       Tanna Furry, MD 12/13/17 1457

## 2017-12-13 NOTE — Telephone Encounter (Signed)
[name] [name] called and left a voicemail on nurse line that she was at work and having pain on her left side and that she had just took an aspirin and was requesting a call back. When I tried to call the patient back, I left a message and then seen she was already checked in to the ER

## 2017-12-13 NOTE — Progress Notes (Signed)
CODE STROKE 0911 CALL 0915 BEEPER 0922 EXAM STARTED 0922 EXAM FINISHED 0922 IMAGES SENT TO SOC 0927 EXAM COMPLETE IN EPIC [place]

## 2017-12-13 NOTE — ED Notes (Signed)
PT to CT at this time.

## 2017-12-13 NOTE — ED Triage Notes (Signed)
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  Reports vision has improved and symptoms are better but says still feels a little different on left side.  Pt alert and oriented.

## 2017-12-13 NOTE — ED Notes (Signed)
Pt transported to CT with primary and charge RN.

## 2017-12-13 NOTE — Discharge Instructions (Addendum)
Follow-up with Dr. Moshe Cipro. Return to ER with new or worsening symptoms

## 2017-12-17 ENCOUNTER — Ambulatory Visit: Payer: 59 | Admitting: Family Medicine

## 2017-12-17 ENCOUNTER — Encounter: Payer: Self-pay | Admitting: Family Medicine

## 2017-12-17 ENCOUNTER — Ambulatory Visit (HOSPITAL_COMMUNITY)
Admission: RE | Admit: 2017-12-17 | Discharge: 2017-12-17 | Disposition: A | Discharge status: Home / Self Care | Payer: 59 | Source: Ambulatory Visit | Attending: Family Medicine | Admitting: Family Medicine

## 2017-12-17 VITALS — BP 130/84 | HR 86 | Resp 16 | Ht 62.0 in | Wt 191.0 lb

## 2017-12-17 DIAGNOSIS — R200 Anesthesia of skin: Secondary | ICD-10-CM

## 2017-12-17 DIAGNOSIS — G8929 Other chronic pain: Secondary | ICD-10-CM | POA: Diagnosis not present

## 2017-12-17 DIAGNOSIS — M25462 Effusion, left knee: Secondary | ICD-10-CM | POA: Diagnosis not present

## 2017-12-17 DIAGNOSIS — F17218 Nicotine dependence, cigarettes, with other nicotine-induced disorders: Secondary | ICD-10-CM

## 2017-12-17 DIAGNOSIS — Z09 Encounter for follow-up examination after completed treatment for conditions other than malignant neoplasm: Secondary | ICD-10-CM

## 2017-12-17 DIAGNOSIS — M25562 Pain in left knee: Secondary | ICD-10-CM | POA: Insufficient documentation

## 2017-12-17 DIAGNOSIS — R202 Paresthesia of skin: Secondary | ICD-10-CM | POA: Diagnosis not present

## 2017-12-17 DIAGNOSIS — I10 Essential (primary) hypertension: Secondary | ICD-10-CM | POA: Diagnosis not present

## 2017-12-17 MED ORDER — PREDNISONE 10 MG PO TABS: ORAL_TABLET | ORAL | 0 refills | Status: DC

## 2017-12-17 MED ORDER — METHYLPREDNISOLONE ACETATE 80 MG/ML IJ SUSP
80.0000 mg | Freq: Once | INTRAMUSCULAR | Status: AC
  Administered 2017-12-17: 80 mg via INTRAMUSCULAR

## 2017-12-17 MED ORDER — IBUPROFEN 800 MG PO TABS: 800.0000 mg | ORAL_TABLET | Freq: Three times a day (TID) | ORAL | 0 refills | Status: DC

## 2017-12-17 MED ORDER — KETOROLAC TROMETHAMINE 60 MG/2ML IM SOLN
60.0000 mg | Freq: Once | INTRAMUSCULAR | Status: AC
  Administered 2017-12-17: 60 mg via INTRAMUSCULAR

## 2017-12-17 MED FILL — predniSONE 10 MG TABS: 10 | 5 days supply | Qty: 10 | Fill #0

## 2017-12-17 NOTE — Patient Instructions (Addendum)
F/U I as before, call if you need me sooner    Injection in office today and medication and Xray of left knee today  You are also being referred to neurologist you have seen before because of recent ED visit for further evaluation     Thank you  for choosing [facility]. We consider it a privelige to serve you.  Delivering excellent health care in a caring and  compassionate way is our goal.  Partnering with you,  so that together we can achieve this goal is our strategy.

## 2017-12-17 NOTE — Progress Notes (Signed)
   [name] [name]     MRN: [id number]      DOB: 1961/01/21   HPI [name] [name] is here for follow up recent ED visit on 3/21 when she presented with unilateral numbness , ruled out for acute CVA, record review shows  white matter changes which are unchanged chronic and unexplained , need neuro eval as soon as possible,    1 month h/o intermittent left knee pain, swelling and stiffness, no known trauma, some relief with ibuprofen, has cracked and popped In the past     ROS Denies recent fever or chills. Denies sinus pressure, nasal congestion, ear pain or sore throat. Denies chest congestion, productive cough or wheezing. Denies chest pains, palpitations and leg swelling Denies abdominal pain, nausea, vomiting,diarrhea or constipation.   Denies dysuria, frequency, hesitancy or incontinence.  Denies depression, anxiety or insomnia. Denies skin break down or rash.   PE  BP 130/84   Pulse 86   Resp 16   Ht 5\' 2"  (1.575 m)   Wt 191 lb (86.6 kg)   SpO2 98%   BMI 34.93 kg/m   Patient alert and oriented and in no cardiopulmonary distress.  HEENT: No facial asymmetry, EOMI,   oropharynx pink and moist.  Neck supple no JVD, no mass.  Chest: Clear to auscultation bilaterally.  CVS: S1, S2 no murmurs, no S3.Regular rate.  ABD: Soft non tender.   Ext: No edema  MS: Adequate ROM spine, shoulders, hips and reduced in left  Knee which is swollen and has crepitus.  Skin: Intact, no ulcerations or rash noted.  Psych: Good eye contact, normal affect. Memory intact not anxious or depressed appearing.  CNS: CN 2-12 intact, power,  normal throughout.no focal deficits noted.   Assessment & Plan  Encounter for examination following treatment at hospital Negative code stroke. Total symptom resolution Past h/o migraine, denies significant headache with symptoms abnormal MRI which is stable , needs neuro follow up  Nicotine dependence Asked: confirms current cigarette smoker Assess:  ready to quit Advise: need to quit to reduce cVA risk and cancer risk , alsready hhas ahd breast cancer Assist : smoking cessation support through her job and QUIT nOW # Arrange : f/u In  2 o 3 month Time spent 5 mins  Knee pain, left Swelling and crepitus of left knee , needs X ray to further evaluate, will likely need ortho management toradol and depomedol iM and 5 day course of prednisone prescribed  Essential hypertension Controlled, no change in medication   Morbid obesity ([place]) Unchanged Patient re-educated about  the importance of commitment to a  minimum of 150 minutes of exercise per week.  The importance of healthy food choices with portion control discussed. Encouraged to start a food diary, count calories and to consider  joining a support group. Sample diet sheets offered. Goals set by the patient for the next several months.   Weight /BMI 12/17/2017 12/13/2017 12/03/2017  WEIGHT 191 lb 192 lb 192 lb  HEIGHT 5\' 2"  5\' 2"  -  BMI 34.93 kg/m2 35.12 kg/m2 35.12 kg/m2

## 2017-12-18 ENCOUNTER — Encounter: Payer: Self-pay | Admitting: Family Medicine

## 2017-12-19 ENCOUNTER — Telehealth: Payer: Self-pay | Admitting: Family Medicine

## 2017-12-19 NOTE — Telephone Encounter (Signed)
Please call with xray results, Left knee is still hurting.

## 2017-12-19 NOTE — Telephone Encounter (Signed)
 Spoke with patient regarding knee x rays and let her know Dr. Camillia Herter recommendations with verbal understanding.     S.

## 2017-12-24 ENCOUNTER — Telehealth: Payer: Self-pay

## 2017-12-24 MED FILL — IBUPROFEN 800 MG TAB: 800 | 7 days supply | Qty: 21 | Fill #0

## 2017-12-24 NOTE — Assessment & Plan Note (Addendum)
Negative code stroke. Total symptom resolution Past h/o migraine, denies significant headache with symptoms abnormal MRI which is stable , needs neuro follow up

## 2017-12-24 NOTE — Assessment & Plan Note (Signed)
Asked: confirms current cigarette smoker Assess: ready to quit Advise: need to quit to reduce cVA risk and cancer risk , alsready hhas ahd breast cancer Assist : smoking cessation support through her job and QUIT nOW # Arrange : f/u In  2 o 3 month Time spent 5 mins

## 2017-12-24 NOTE — Telephone Encounter (Signed)
Pt following up on neuro referral. I don't see any entered? Please advise

## 2017-12-24 NOTE — Telephone Encounter (Signed)
pls see referral to Dr Domingo Cocking , neurologist she has seen before for headaches, note is finished , please see if you can get her an appointment date, let her know, thanks

## 2017-12-24 NOTE — Assessment & Plan Note (Addendum)
Swelling and crepitus of left knee , needs X ray to further evaluate, will likely need ortho management toradol and depomedol iM and 5 day course of prednisone prescribed

## 2017-12-24 NOTE — Assessment & Plan Note (Addendum)
Unchanged Patient re-educated about  the importance of commitment to a  minimum of 150 minutes of exercise per week.  The importance of healthy food choices with portion control discussed. Encouraged to start a food diary, count calories and to consider  joining a support group. Sample diet sheets offered. Goals set by the patient for the next several months.   Weight /BMI 12/17/2017 12/13/2017 12/03/2017  WEIGHT 191 lb 192 lb 192 lb  HEIGHT 5\' 2"  5\' 2"  -  BMI 34.93 kg/m2 35.12 kg/m2 35.12 kg/m2

## 2017-12-24 NOTE — Assessment & Plan Note (Signed)
Controlled, no change in medication  

## 2017-12-25 ENCOUNTER — Telehealth: Payer: Self-pay | Admitting: Family Medicine

## 2017-12-25 DIAGNOSIS — M25562 Pain in left knee: Secondary | ICD-10-CM

## 2017-12-25 NOTE — Telephone Encounter (Signed)
Pt is calling to advise she has completed the predisone, and is still hurting and swollen, would like to take the next step on fluid removal.

## 2017-12-25 NOTE — Telephone Encounter (Signed)
I spoke directly to the patient and made it clear to her that she will need to call and speak directly with the office to schedule her appt. She said she did not know that she owed anything else there. I told her we had done all that we needed to do and that she will need to move forward with getting her appointment date, she said OK and thanks

## 2017-12-25 NOTE — Telephone Encounter (Signed)
Per Nevin Bloodgood at [facility] patient needs to contact the billing dept before an appt can be scheduled. I gave her their phone number yesterday afternoon.

## 2017-12-25 NOTE — Telephone Encounter (Signed)
pls refer her to ortho of her  Choice as we discussed I will sign

## 2017-12-25 NOTE — Telephone Encounter (Signed)
Patient left message on nurse line asking again about this.

## 2017-12-25 NOTE — Addendum Note (Signed)
Addended by: Eual Fines on: 12/25/2017 04:08 PM   Modules accepted: Orders

## 2017-12-27 NOTE — Progress Notes (Signed)
Diagnosis Thrombocytosis ([place]) - Plan: MM Digital Diagnostic Bilat, CBC with Differential/Platelet, Comprehensive metabolic panel, Lactate dehydrogenase, Ferritin  Staging Cancer Staging No matching staging information was found for the patient.  Assessment and Plan:  1. Mild thrombocytosis-Thrombocytosis workup has been negative. CRP/sedimentation rate, ANA, rheumatoid factor, TSH previously checked were all within normal limits. JAK2 mutation with reflex to exon 12, MPL, CALR was negative as well.  Labs done 12/13/2017 showed a white count 8.2 hemoglobin 13.6 platelets 473,000.  Chemistries within normal limits other than a slightly decreased potassium at 3.3.  Patient will remain on observation and unless platelet count elevates significantly above 500,000 no further workup at the present time is recommended.  She will return to clinic in August 2019 for follow-up and repeat labs.  She should notify the office if she has any problems prior to that visit.  2.  Hypokalemia.  Potassium minimally decreased at 3.3.  Patient is on hydrochlorothiazide.  Continue to follow-up with PCP for follow-up lab evaluation.  3. Remote history of left breast cancer more than 12 years ago.  Last bilateral screening mammogram in August 2018 was negative for malignancy.  She is set up for bilateral screening mammogram in August 20 19 and will return to clinic at that time to go over the results.  4.  Hypertension.  Blood pressure is 136/77.  Continue to follow-up with PCP.  5.  Smoking.  Cessation is recommended.  Patient will be presented option of lung cancer screening program.   Interval History:  57 y.o. female with remote history of breast cancer 12 years ago, treated with surgery, chemo, and RT.  Pt was previously seen by Dr. Talbert Cage for mild thrombocytosis.   CBC from 04/09/2017 demonstrated WBC 10.4 K, hemoglobin 13.7 K, hematocrit 39.9%, platelet count 478K. Previous CBC from 02/26/2017 demonstrated platelet  count 424K. Prior to that CBC on 02/28/2016 and was treated a platelet count 399K. Patient's CRP was elevated at 9.3 back in July as well. Patient gave a history of a pulled a muscle under her left breast and was having a lot of pain and inflammation at that time.  Current Status: Patient is seen today for follow-up to go over lab studies.  Problem List Patient Active Problem List   Diagnosis Date Noted  . Knee pain, left [M25.562] 12/17/2017  . Thrombocytosis ([place]) [D47.3] 06/25/2017  . Encounter for examination following treatment at hospital [Z09] 05/17/2015  . Metabolic syndrome X [Z30.86] 11/16/2013  . Nicotine dependence [F17.200] 06/30/2013  . Vitamin D deficiency [E55.9] 08/12/2012  . Prediabetes [R73.03] 04/08/2012  . Allergic rhinitis [J30.9] 01/21/2008  . Malignant neoplasm of breast (female) ([place]) [C50.919] 01/17/2008  . Hyperlipemia [E78.5] 01/17/2008  . Morbid obesity ([place]) [E66.01] 01/17/2008  . Essential hypertension [I10] 01/17/2008    Past Medical History Past Medical History:  Diagnosis Date  . Adenocarcinoma of breast ([place])    left   . Heel spur   . Hyperlipidemia   . Hypertension   . Migraines   . Obesity     Past Surgical History Past Surgical History:  Procedure Laterality Date  . BREAST SURGERY    . btl  1992  . CHOLECYSTECTOMY    . COLONOSCOPY  09/15/2011   Procedure: COLONOSCOPY;  Surgeon: Dorothyann Peng, MD;  [facility];  Service: Endoscopy;  Laterality: N/A;  9:15 AM  . left lumpectomy and lymph node disection  2000  . r heel surgery for spur and chipped bone  11/09/20099  .  TUBAL LIGATION      Family History Family History  Problem Relation Age of Onset  . Heart attack Mother   . Heart attack Father   . Glaucoma Father   . Hypertension Father   . Diabetes Sister   . Anesthesia problems Neg Hx   . Hypotension Neg Hx   . Malignant hyperthermia Neg Hx   . Pseudochol deficiency Neg Hx   . Migraines Neg Hx      Social  History  reports that she has been smoking cigarettes.  She has a 3.75 pack-year smoking history. She has never used smokeless tobacco. She reports that she does not drink alcohol or use drugs.  Medications  Current Outpatient Medications:  .  amLODipine (NORVASC) 2.5 MG tablet, TAKE 1 TABLET BY MOUTH ONCE DAILY, Disp: 90 tablet, Rfl: 1 .  Biotin 1000 MCG tablet, Take 1,000 mcg by mouth daily. , Disp: , Rfl:  .  fluticasone (FLONASE) 50 MCG/ACT nasal spray, Place 1 spray into both nostrils daily., Disp: , Rfl:  .  hydrochlorothiazide (HYDRODIURIL) 25 MG tablet, TAKE 1 TABLET BY MOUTH DAILY, Disp: 90 tablet, Rfl: 1 .  ibuprofen (ADVIL,MOTRIN) 800 MG tablet, Take 1 tablet (800 mg total) by mouth 3 (three) times daily., Disp: 21 tablet, Rfl: 0 .  potassium chloride SA (K-DUR,KLOR-CON) 20 MEQ tablet, Take 1 tablet (20 mEq total) by mouth daily., Disp: 90 tablet, Rfl: 1 .  predniSONE (DELTASONE) 10 MG tablet, One tablet twice daily, Disp: 10 tablet, Rfl: 0  Allergies Ace inhibitors  Review of Systems Review of Systems - Oncology ROS as per HPI otherwise 12 point ROS is negative.   Physical Exam  Vitals Wt Readings from Last 3 Encounters:  12/17/17 191 lb (86.6 kg)  12/13/17 192 lb (87.1 kg)  12/03/17 192 lb (87.1 kg)   Temp Readings from Last 3 Encounters:  12/13/17 98.2 F (36.8 C) (Oral)  12/03/17 98.8 F (37.1 C) (Oral)  07/24/17 98.3 F (36.8 C) (Oral)   BP Readings from Last 3 Encounters:  12/17/17 130/84  12/13/17 126/82  12/03/17 136/77   Pulse Readings from Last 3 Encounters:  12/17/17 86  12/13/17 82  12/03/17 90   Constitutional: Well-developed, well-nourished, and in no distress.   HENT: Head: Normocephalic and atraumatic.  Mouth/Throat: No oropharyngeal exudate. Mucosa moist. Eyes: Pupils are equal, round, and reactive to light. Conjunctivae are normal. No scleral icterus.  Neck: Normal range of motion. Neck supple. No JVD present.  Cardiovascular: Normal  rate, regular rhythm and normal heart sounds.  Exam reveals no gallop and no friction rub.   No murmur heard. Pulmonary/Chest: Effort normal and breath sounds normal. No respiratory distress. No wheezes.No rales.  Abdominal: Soft. Bowel sounds are normal. No distension. There is no tenderness. There is no guarding.  Musculoskeletal: No edema or tenderness.  Lymphadenopathy: No cervical, axillary or supraclavicular adenopathy.  Neurological: Alert and oriented to person, place, and time. No cranial nerve deficit.  Skin: Skin is warm and dry. No rash noted. No erythema. No pallor.  Psychiatric: Affect and judgment normal.   Labs No visits with results within 3 Day(s) from this visit.  Latest known visit with results is:  Appointment on 11/26/2017  Component Date Value Ref Range Status  . WBC 11/26/2017 7.8  4.0 - 10.5 K/uL Final  . RBC 11/26/2017 4.56  3.87 - 5.11 MIL/uL Final  . Hemoglobin 11/26/2017 13.6  12.0 - 15.0 g/dL Final  . HCT 11/26/2017 41.9  36.0 -  46.0 % Final  . MCV 11/26/2017 91.9  78.0 - 100.0 fL Final  . MCH 11/26/2017 29.8  26.0 - 34.0 pg Final  . MCHC 11/26/2017 32.5  30.0 - 36.0 g/dL Final  . RDW 11/26/2017 13.9  11.5 - 15.5 % Final  . Platelets 11/26/2017 438* 150 - 400 K/uL Final  . Neutrophils Relative % 11/26/2017 51  % Final  . Neutro Abs 11/26/2017 4.1  1.7 - 7.7 K/uL Final  . Lymphocytes Relative 11/26/2017 37  % Final  . Lymphs Abs 11/26/2017 2.9  0.7 - 4.0 K/uL Final  . Monocytes Relative 11/26/2017 7  % Final  . Monocytes Absolute 11/26/2017 0.6  0.1 - 1.0 K/uL Final  . Eosinophils Relative 11/26/2017 4  % Final  . Eosinophils Absolute 11/26/2017 0.3  0.0 - 0.7 K/uL Final  . Basophils Relative 11/26/2017 1  % Final  . Basophils Absolute 11/26/2017 0.0  0.0 - 0.1 K/uL Final   Performed at [facility], [address] [place], [place] [address]  . Sodium 11/26/2017 138  135 - 145 mmol/L Final  . Potassium 11/26/2017 3.6  3.5 - 5.1 mmol/L Final  .  Chloride 11/26/2017 103  101 - 111 mmol/L Final  . CO2 11/26/2017 25  22 - 32 mmol/L Final  . Glucose, Bld 11/26/2017 92  65 - 99 mg/dL Final  . BUN 11/26/2017 13  6 - 20 mg/dL Final  . Creatinine, Ser 11/26/2017 0.56  0.44 - 1.00 mg/dL Final  . Calcium 11/26/2017 9.4  8.9 - 10.3 mg/dL Final  . Total Protein 11/26/2017 7.4  6.5 - 8.1 g/dL Final  . Albumin 11/26/2017 3.9  3.5 - 5.0 g/dL Final  . AST 11/26/2017 17  15 - 41 U/L Final  . ALT 11/26/2017 13* 14 - 54 U/L Final  . Alkaline Phosphatase 11/26/2017 67  38 - 126 U/L Final  . Total Bilirubin 11/26/2017 0.3  0.3 - 1.2 mg/dL Final  . GFR calc non Af Amer 11/26/2017 >60  >60 mL/min Final  . GFR calc Af Amer 11/26/2017 >60  >60 mL/min Final   Comment: (NOTE) The eGFR has been calculated using the CKD EPI equation. This calculation has not been validated in all clinical situations. eGFR's persistently <60 mL/min signify possible Chronic Kidney Disease.   Georgiann Hahn gap 11/26/2017 10  5 - 15 Final   Performed at [facility], [address] [place], [place] [address]     Pathology Orders Placed This Encounter  Procedures  . MM Digital Diagnostic Bilat    Standing Status:   Future    Standing Expiration Date:   12/03/2018    Order Specific Question:   Reason for Exam (SYMPTOM  OR DIAGNOSIS REQUIRED)    Answer:   reported left breast cancer    Order Specific Question:   Is the patient pregnant?    Answer:   No    Order Specific Question:   Preferred imaging location?    Answer:   [facility]  . CBC with Differential/Platelet    Standing Status:   Future    Standing Expiration Date:   12/04/2018  . Comprehensive metabolic panel    Standing Status:   Future    Standing Expiration Date:   12/04/2018  . Lactate dehydrogenase    Standing Status:   Future    Standing Expiration Date:   12/04/2018  . Ferritin    Standing Status:   Future    Standing Expiration Date:  12/04/2018       Zoila Shutter MD

## 2017-12-31 MED FILL — HYDROCHLOROTHIAZIDE 25 MG T: 25 | 90 days supply | Qty: 90 | Fill #1

## 2018-01-15 ENCOUNTER — Telehealth: Payer: Self-pay | Admitting: Family Medicine

## 2018-01-15 NOTE — Telephone Encounter (Signed)
FMLA Copies, sleeved

## 2018-01-18 ENCOUNTER — Other Ambulatory Visit: Payer: Self-pay | Admitting: Family Medicine

## 2018-01-18 MED FILL — IBUPROFEN 800 MG TAB: 800 | 7 days supply | Qty: 21 | Fill #0

## 2018-01-25 ENCOUNTER — Encounter: Payer: Self-pay | Admitting: Orthopedic Surgery

## 2018-01-25 ENCOUNTER — Ambulatory Visit: Payer: 59 | Admitting: Orthopedic Surgery

## 2018-01-25 VITALS — BP 125/84 | HR 96 | Ht 62.0 in | Wt 192.0 lb

## 2018-01-25 DIAGNOSIS — M25462 Effusion, left knee: Secondary | ICD-10-CM

## 2018-01-25 MED ORDER — PREDNISONE 10 MG PO TABS
10.0000 mg | ORAL_TABLET | Freq: Two times a day (BID) | ORAL | 0 refills | Status: DC
Start: 2018-01-25 — End: 2018-05-09

## 2018-01-25 MED FILL — predniSONE 10 MG TABS: 10 | 14 days supply | Qty: 28 | Fill #0

## 2018-01-25 NOTE — Progress Notes (Signed)
NEW PATIENT OFFICE VISIT   Chief Complaint  Patient presents with  . Knee Pain    left knee pain for about a month no known injury      MEDICAL DECISION SECTION  xrays ordered?  No  My independent reading of xrays: Imaging studies were done at the hospital I have reviewed those images and I have read the report the report was read as no significant joint space narrowing but suprapatellar effusion  I agree that there is mild joint space narrowing medially does not seem significant on x-ray   Encounter Diagnosis  Name Primary?  . Effusion of left knee Yes     PLAN: The patient has a synovitis of the knee with minimal degenerative changes on x-ray  I have advised and written a prescription for continuation of prednisone 1 p.o. twice daily 10 mg for 28 days.  I arranged a follow-up on an as-needed basis  Meds ordered this encounter  Medications  . predniSONE (DELTASONE) 10 MG tablet    Sig: Take 1 tablet (10 mg total) by mouth 2 (two) times daily with a meal.    Dispense:  28 tablet    Refill:  0    Chief Complaint  Patient presents with  . 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  Trauma denied.   Review of Systems  Constitutional: Negative for fever.  Skin: Negative.   Neurological: Negative for tingling.     Past Medical History:  Diagnosis Date  . Adenocarcinoma of breast ([place])    left   . Heel spur   . Hyperlipidemia   . Hypertension   . Migraines   . Obesity     Past Surgical History:  Procedure Laterality Date  . BREAST SURGERY    . btl  1992  . CHOLECYSTECTOMY    . COLONOSCOPY  09/15/2011   Procedure:  COLONOSCOPY;  Surgeon: Dorothyann Peng, MD;  [facility];  Service: Endoscopy;  Laterality: N/A;  9:15 AM  . left lumpectomy and lymph node disection  2000  . r heel surgery for spur and chipped bone  11/09/20099  . TUBAL LIGATION      Family History  Problem Relation Age of Onset  . Heart attack Mother   . Heart attack Father   . Glaucoma Father   . Hypertension Father   . Diabetes Sister   . Anesthesia problems Neg Hx   . Hypotension Neg Hx   . Malignant hyperthermia Neg Hx   . Pseudochol deficiency Neg Hx   . Migraines Neg Hx    Social History   Tobacco Use  . Smoking status: Current Every Day Smoker    Packs/day: 0.25    Years: 15.00    Pack years: 3.75    Types: Cigarettes  . Smokeless tobacco: Never Used  . Tobacco comment: smoking 3 per day  Substance Use Topics  . Alcohol use: No    Alcohol/week: 0.0 oz  . Drug use: No    Comment: pt states "3 cigarrettes per day"    Allergies  Allergen Reactions  .  Ace Inhibitors Cough    REACTION: cough    Current Meds  Medication Sig  . amLODipine (NORVASC) 2.5 MG tablet TAKE 1 TABLET BY MOUTH ONCE DAILY  . hydrochlorothiazide (HYDRODIURIL) 25 MG tablet TAKE 1 TABLET BY MOUTH DAILY  . ibuprofen (ADVIL,MOTRIN) 800 MG tablet TAKE 1 TABLET BY MOUTH 3 TIMES DAILY.  Marland Kitchen potassium chloride SA (K-DUR,KLOR-CON) 20 MEQ tablet Take 1 tablet (20 mEq total) by mouth daily.  . [DISCONTINUED] predniSONE (DELTASONE) 10 MG tablet One tablet twice daily    BP 125/84   Pulse 96   Ht 5\' 2"  (1.575 m)   Wt 192 lb (87.1 kg)   BMI 35.12 kg/m   Physical Exam  Constitutional: She is oriented to person, place, and time. She appears well-developed and well-nourished.  Musculoskeletal:       Left knee: She exhibits effusion.  Neurological: She is alert and oriented to person, place, and time.  Psychiatric: She has a normal mood and affect. Judgment normal.  Vitals reviewed.   Right Knee Exam   Muscle Strength  The patient  has normal right knee strength.  Tenderness  The patient is experiencing no tenderness.   Range of Motion  Extension: normal  Flexion: normal   Tests  McMurray:  Medial - negative Lateral - negative Varus: negative Valgus: negative Drawer:  Anterior - negative    Posterior - negative  Other  Erythema: absent Scars: absent Sensation: normal Pulse: present Swelling: none   Left Knee Exam   Muscle Strength  The patient has normal left knee strength.  Tenderness  Left knee tenderness location: Tenderness over the medial femoral condyle and medial joint line.  Range of Motion  Extension:  5 normal  Flexion: normal Left knee flexion: 125.   Tests  McMurray:  Medial - negative Lateral - negative Varus: negative Valgus: negative Drawer:  Anterior - negative     Posterior - negative  Other  Erythema: absent Scars: absent Sensation: normal Pulse: present Swelling: none Effusion: effusion present

## 2018-01-28 ENCOUNTER — Other Ambulatory Visit: Payer: Self-pay | Admitting: Family Medicine

## 2018-01-28 DIAGNOSIS — I10 Essential (primary) hypertension: Secondary | ICD-10-CM

## 2018-01-28 MED FILL — AMLODIPINE BESYLATE 2.5 MG: 2.5 | 90 days supply | Qty: 90 | Fill #0

## 2018-01-28 MED FILL — POTASSIUM CL ER 20 MEQ TABL: 20 | 90 days supply | Qty: 90 | Fill #3

## 2018-01-29 ENCOUNTER — Telehealth: Payer: Self-pay | Admitting: Family Medicine

## 2018-01-29 NOTE — Telephone Encounter (Signed)
Dr Moshe Cipro needs to ask her a question about it before its faxed

## 2018-01-29 NOTE — Telephone Encounter (Signed)
[name] [name] calling to check on her paperwork.  It is due by 01/30/18 or they must start over.

## 2018-01-30 ENCOUNTER — Telehealth: Payer: Self-pay | Admitting: Family Medicine

## 2018-01-30 NOTE — Telephone Encounter (Signed)
Pam is calling in from Dr Domingo Cocking office, and they spoke with the PT on 4-3 and there is some past financial issues, they havent heard back from the PT

## 2018-02-11 ENCOUNTER — Ambulatory Visit: Payer: 59 | Admitting: Family Medicine

## 2018-03-01 ENCOUNTER — Ambulatory Visit (HOSPITAL_COMMUNITY): Payer: 59

## 2018-03-04 ENCOUNTER — Ambulatory Visit (HOSPITAL_COMMUNITY): Payer: 59

## 2018-03-25 ENCOUNTER — Other Ambulatory Visit: Payer: Self-pay | Admitting: Family Medicine

## 2018-03-25 MED FILL — IBUPROFEN 800 MG TAB: 800 | 7 days supply | Qty: 21 | Fill #0

## 2018-03-25 MED FILL — HYDROCHLOROTHIAZIDE 25 MG T: 25 | 90 days supply | Qty: 90 | Fill #0

## 2018-04-17 ENCOUNTER — Other Ambulatory Visit: Payer: Self-pay | Admitting: Family Medicine

## 2018-04-17 MED FILL — IBUPROFEN 800 MG TAB: 800 | 7 days supply | Qty: 21 | Fill #0

## 2018-04-24 MED FILL — POTASSIUM CL ER 20 MEQ TAB: 20 | 90 days supply | Qty: 90 | Fill #4

## 2018-04-24 MED FILL — AMLODIPINE 2.5 MG TABLET: 2.5 | 90 days supply | Qty: 90 | Fill #1

## 2018-05-08 ENCOUNTER — Telehealth: Payer: Self-pay | Admitting: Family Medicine

## 2018-05-08 NOTE — Telephone Encounter (Signed)
Pt LVM that she thinks she has a UTI--- I called both numbers on file, no answer.

## 2018-05-09 ENCOUNTER — Encounter: Payer: Self-pay | Admitting: Family Medicine

## 2018-05-09 ENCOUNTER — Ambulatory Visit (INDEPENDENT_AMBULATORY_CARE_PROVIDER_SITE_OTHER): Payer: 59 | Admitting: Family Medicine

## 2018-05-09 VITALS — BP 134/84 | HR 85 | Resp 14 | Ht 62.0 in | Wt 195.0 lb

## 2018-05-09 DIAGNOSIS — N3001 Acute cystitis with hematuria: Secondary | ICD-10-CM

## 2018-05-09 DIAGNOSIS — E8881 Metabolic syndrome: Secondary | ICD-10-CM

## 2018-05-09 DIAGNOSIS — E559 Vitamin D deficiency, unspecified: Secondary | ICD-10-CM | POA: Diagnosis not present

## 2018-05-09 DIAGNOSIS — F17218 Nicotine dependence, cigarettes, with other nicotine-induced disorders: Secondary | ICD-10-CM | POA: Diagnosis not present

## 2018-05-09 DIAGNOSIS — R350 Frequency of micturition: Secondary | ICD-10-CM | POA: Diagnosis not present

## 2018-05-09 DIAGNOSIS — I10 Essential (primary) hypertension: Secondary | ICD-10-CM | POA: Diagnosis not present

## 2018-05-09 DIAGNOSIS — E7849 Other hyperlipidemia: Secondary | ICD-10-CM

## 2018-05-09 LAB — POCT URINALYSIS DIPSTICK
Bilirubin, UA: NEGATIVE
Glucose, UA: NEGATIVE
Ketones, UA: NEGATIVE
NITRITE UA: NEGATIVE
PROTEIN UA: NEGATIVE
SPEC GRAV UA: 1.015 (ref 1.010–1.025)
Urobilinogen, UA: 0.2 E.U./dL
pH, UA: 5 (ref 5.0–8.0)

## 2018-05-09 MED ORDER — CIPROFLOXACIN HCL 500 MG PO TABS: 500.0000 mg | ORAL_TABLET | Freq: Two times a day (BID) | ORAL | 0 refills | Status: DC

## 2018-05-09 MED FILL — CIPROFLOXACIN HCL 500 MG TA: 500 | 3 days supply | Qty: 6 | Fill #0

## 2018-05-09 NOTE — Progress Notes (Signed)
   [name] [name]     MRN: [id number]      DOB: 1961/03/15   HPI [name] [name] is here for follow up and re-evaluation of chronic medical conditions, medication management and review of any available recent lab and radiology data.  Preventive health is updated, specifically  Cancer screening and Immunization.   No regular exercise  6 day h/o urinary frequency and itching , no fever , chills or flank pain, no nausea or vomit No vaginal discharge or dyspaurenia  ROS Denies recent fever or chills. Denies sinus pressure, nasal congestion, ear pain or sore throat. Denies chest congestion, productive cough or wheezing. Denies chest pains, palpitations and leg swelling Denies abdominal pain, nausea, vomiting,diarrhea or constipation.    Denies joint pain, swelling and limitation in mobility. Denies headaches, seizures, numbness, or tingling. Denies depression, anxiety or insomnia. Denies skin break down or rash.   PE  BP 134/84   Pulse 85   Ht 5\' 2"  (1.575 m)   Wt 195 lb (88.5 kg)   SpO2 95%   BMI 35.67 kg/m   Patient alert and oriented and in no cardiopulmonary distress.  HEENT: No facial asymmetry, EOMI,   oropharynx pink and moist.  Neck supple no JVD, no mass.  Chest: Clear to auscultation bilaterally.  CVS: S1, S2 no murmurs, no S3.Regular rate.  ABD: Soft non tender. No flank or suprapubic tenderness  Ext: No edema  MS: Adequate ROM spine, shoulders, hips and knees.  Skin: Intact, no ulcerations or rash noted.  Psych: Good eye contact, normal affect. Memory intact not anxious or depressed appearing.  CNS: CN 2-12 intact, power,  normal throughout.no focal deficits noted.   Assessment & Plan  Acute cystitis with hematuria symptomatic with abnormal UA, send for c/s and cipro x 3 days  prescribed  Essential hypertension Controlled, no change in medication DASH diet and commitment to daily physical activity for a minimum of 30 minutes discussed and encouraged, as a  part of hypertension management. The importance of attaining a healthy weight is also discussed.  BP/Weight 05/09/2018 01/25/2018 12/17/2017 12/13/2017 12/03/2017 10/08/2017 17/00/1749  Systolic BP 449 675 916 384 665 993 570  Diastolic BP 84 84 84 82 77 82 79  Wt. (Lbs) 195 192 191 192 192 196 199.2  BMI 35.67 35.12 34.93 35.12 35.12 35.85 36.43       Morbid obesity (HCC) Deteriorated. Patient re-educated about  the importance of commitment to a  minimum of 150 minutes of exercise per week.  The importance of healthy food choices with portion control discussed. Encouraged to start a food diary, count calories and to consider  joining a support group. Sample diet sheets offered. Goals set by the patient for the next several months.   Weight /BMI 05/09/2018 01/25/2018 12/17/2017  WEIGHT 195 lb 192 lb 191 lb  HEIGHT 5\' 2"  5\' 2"  5\' 2"   BMI 35.67 kg/m2 35.12 kg/m2 34.93 kg/m2      Nicotine dependence Asked : confirms currently smokes 5 cigarettes/ day Assess; unwilling to quit Advised: needs to quit to reduce cancer risk as well as risk of heart disease and stroke Assist: counselled for 3 mins Arrange : f/iu in 3 to 4 months

## 2018-05-09 NOTE — Patient Instructions (Addendum)
Physical exam  October 7 or shortly after  Please get fasting lipid, cm,p and eGFr, hBa1C and TSH and vit D october 2 or shortly after  It is important that you exercise regularly at least 30 minutes 5 times a week. If you develop chest pain, have severe difficulty breathing, or feel very tired, stop exercising immediately and seek medical attention    Urine is being sent for additional testing and I will prescribe 3 days of cipro   Thank you  for choosing [facility]. We consider it a privelige to serve you.  Delivering excellent health care in a caring and  compassionate way is our goal.  Partnering with you,  so that together we can achieve this goal is our strategy.

## 2018-05-10 ENCOUNTER — Encounter: Payer: Self-pay | Admitting: Family Medicine

## 2018-05-10 DIAGNOSIS — N3001 Acute cystitis with hematuria: Secondary | ICD-10-CM | POA: Insufficient documentation

## 2018-05-10 NOTE — Assessment & Plan Note (Signed)
Deteriorated. Patient re-educated about  the importance of commitment to a  minimum of 150 minutes of exercise per week.  The importance of healthy food choices with portion control discussed. Encouraged to start a food diary, count calories and to consider  joining a support group. Sample diet sheets offered. Goals set by the patient for the next several months.   Weight /BMI 05/09/2018 01/25/2018 12/17/2017  WEIGHT 195 lb 192 lb 191 lb  HEIGHT 5\' 2"  5\' 2"  5\' 2"   BMI 35.67 kg/m2 35.12 kg/m2 34.93 kg/m2

## 2018-05-10 NOTE — Assessment & Plan Note (Signed)
Asked : confirms currently smokes 5 cigarettes/ day Assess; unwilling to quit Advised: needs to quit to reduce cancer risk as well as risk of heart disease and stroke Assist: counselled for 3 mins Arrange : f/iu in 3 to 4 months

## 2018-05-10 NOTE — Assessment & Plan Note (Signed)
symptomatic with abnormal UA, send for c/s and cipro x 3 days  prescribed

## 2018-05-10 NOTE — Assessment & Plan Note (Signed)
Controlled, no change in medication DASH diet and commitment to daily physical activity for a minimum of 30 minutes discussed and encouraged, as a part of hypertension management. The importance of attaining a healthy weight is also discussed.  BP/Weight 05/09/2018 01/25/2018 12/17/2017 12/13/2017 12/03/2017 10/08/2017 49/67/5916  Systolic BP 384 665 993 570 177 939 030  Diastolic BP 84 84 84 82 77 82 79  Wt. (Lbs) 195 192 191 192 192 196 199.2  BMI 35.67 35.12 34.93 35.12 35.12 35.85 36.43

## 2018-05-12 LAB — URINE CULTURE
MICRO NUMBER: 90976271
SPECIMEN QUALITY:: ADEQUATE

## 2018-05-13 ENCOUNTER — Ambulatory Visit (HOSPITAL_COMMUNITY)
Admission: RE | Admit: 2018-05-13 | Discharge: 2018-05-13 | Disposition: A | Discharge status: Home / Self Care | Payer: 59 | Source: Ambulatory Visit | Attending: Internal Medicine | Admitting: Internal Medicine

## 2018-05-13 DIAGNOSIS — Z1231 Encounter for screening mammogram for malignant neoplasm of breast: Secondary | ICD-10-CM | POA: Diagnosis not present

## 2018-05-14 ENCOUNTER — Other Ambulatory Visit: Payer: Self-pay | Admitting: Family Medicine

## 2018-05-14 DIAGNOSIS — N3001 Acute cystitis with hematuria: Secondary | ICD-10-CM

## 2018-05-14 NOTE — Telephone Encounter (Signed)
New Message  Pt calling to get lab results and wanting someone to give her a call back.  Please f/u

## 2018-05-17 ENCOUNTER — Inpatient Hospital Stay (HOSPITAL_COMMUNITY): Payer: 59 | Attending: Internal Medicine

## 2018-05-17 DIAGNOSIS — D75839 Thrombocytosis, unspecified: Secondary | ICD-10-CM

## 2018-05-17 DIAGNOSIS — Z853 Personal history of malignant neoplasm of breast: Secondary | ICD-10-CM | POA: Insufficient documentation

## 2018-05-17 DIAGNOSIS — Z923 Personal history of irradiation: Secondary | ICD-10-CM | POA: Diagnosis not present

## 2018-05-17 DIAGNOSIS — Z9221 Personal history of antineoplastic chemotherapy: Secondary | ICD-10-CM | POA: Insufficient documentation

## 2018-05-17 DIAGNOSIS — D473 Essential (hemorrhagic) thrombocythemia: Secondary | ICD-10-CM | POA: Insufficient documentation

## 2018-05-17 DIAGNOSIS — E669 Obesity, unspecified: Secondary | ICD-10-CM | POA: Insufficient documentation

## 2018-05-17 DIAGNOSIS — F17210 Nicotine dependence, cigarettes, uncomplicated: Secondary | ICD-10-CM | POA: Insufficient documentation

## 2018-05-17 DIAGNOSIS — E785 Hyperlipidemia, unspecified: Secondary | ICD-10-CM | POA: Diagnosis not present

## 2018-05-17 DIAGNOSIS — I10 Essential (primary) hypertension: Secondary | ICD-10-CM | POA: Insufficient documentation

## 2018-05-17 DIAGNOSIS — Z79899 Other long term (current) drug therapy: Secondary | ICD-10-CM | POA: Insufficient documentation

## 2018-05-17 DIAGNOSIS — E876 Hypokalemia: Secondary | ICD-10-CM | POA: Diagnosis not present

## 2018-05-17 LAB — CBC WITH DIFFERENTIAL/PLATELET
Basophils Absolute: 0 10*3/uL (ref 0.0–0.1)
Basophils Relative: 1 %
EOS ABS: 0.3 10*3/uL (ref 0.0–0.7)
EOS PCT: 4 %
HCT: 41.4 % (ref 36.0–46.0)
Hemoglobin: 13.7 g/dL (ref 12.0–15.0)
LYMPHS ABS: 3.2 10*3/uL (ref 0.7–4.0)
Lymphocytes Relative: 39 %
MCH: 30.9 pg (ref 26.0–34.0)
MCHC: 33.1 g/dL (ref 30.0–36.0)
MCV: 93.2 fL (ref 78.0–100.0)
MONO ABS: 0.5 10*3/uL (ref 0.1–1.0)
MONOS PCT: 7 %
Neutro Abs: 4.1 10*3/uL (ref 1.7–7.7)
Neutrophils Relative %: 49 %
PLATELETS: 419 10*3/uL — AB (ref 150–400)
RBC: 4.44 MIL/uL (ref 3.87–5.11)
RDW: 14.1 % (ref 11.5–15.5)
WBC: 8.1 10*3/uL (ref 4.0–10.5)

## 2018-05-17 LAB — COMPREHENSIVE METABOLIC PANEL
ALT: 15 U/L (ref 0–44)
ANION GAP: 8 (ref 5–15)
AST: 15 U/L (ref 15–41)
Albumin: 4 g/dL (ref 3.5–5.0)
Alkaline Phosphatase: 61 U/L (ref 38–126)
BUN: 11 mg/dL (ref 6–20)
CHLORIDE: 105 mmol/L (ref 98–111)
CO2: 29 mmol/L (ref 22–32)
Calcium: 9.7 mg/dL (ref 8.9–10.3)
Creatinine, Ser: 0.65 mg/dL (ref 0.44–1.00)
GFR calc non Af Amer: >60 mL/min (ref >60)
Glucose, Bld: 85 mg/dL (ref 70–99)
Potassium: 3.7 mmol/L (ref 3.5–5.1)
Sodium: 142 mmol/L (ref 135–145)
Total Bilirubin: 0.5 mg/dL (ref 0.3–1.2)
Total Protein: 7.3 g/dL (ref 6.5–8.1)

## 2018-05-17 LAB — LACTATE DEHYDROGENASE: LDH: 153 U/L (ref 98–192)

## 2018-05-17 LAB — FERRITIN: FERRITIN: 71 ng/mL (ref 11–307)

## 2018-05-20 ENCOUNTER — Other Ambulatory Visit (HOSPITAL_COMMUNITY): Payer: 59

## 2018-05-20 ENCOUNTER — Other Ambulatory Visit: Payer: Self-pay

## 2018-05-20 ENCOUNTER — Inpatient Hospital Stay (HOSPITAL_BASED_OUTPATIENT_CLINIC_OR_DEPARTMENT_OTHER): Payer: 59 | Admitting: Hematology

## 2018-05-20 ENCOUNTER — Encounter (HOSPITAL_COMMUNITY): Payer: Self-pay | Admitting: Hematology

## 2018-05-20 DIAGNOSIS — D473 Essential (hemorrhagic) thrombocythemia: Secondary | ICD-10-CM

## 2018-05-20 DIAGNOSIS — E876 Hypokalemia: Secondary | ICD-10-CM

## 2018-05-20 DIAGNOSIS — Z9221 Personal history of antineoplastic chemotherapy: Secondary | ICD-10-CM

## 2018-05-20 DIAGNOSIS — Z853 Personal history of malignant neoplasm of breast: Secondary | ICD-10-CM | POA: Diagnosis not present

## 2018-05-20 DIAGNOSIS — E785 Hyperlipidemia, unspecified: Secondary | ICD-10-CM | POA: Diagnosis not present

## 2018-05-20 DIAGNOSIS — D75839 Thrombocytosis, unspecified: Secondary | ICD-10-CM

## 2018-05-20 DIAGNOSIS — I10 Essential (primary) hypertension: Secondary | ICD-10-CM | POA: Diagnosis not present

## 2018-05-20 DIAGNOSIS — F17210 Nicotine dependence, cigarettes, uncomplicated: Secondary | ICD-10-CM

## 2018-05-20 DIAGNOSIS — Z923 Personal history of irradiation: Secondary | ICD-10-CM

## 2018-05-20 DIAGNOSIS — Z79899 Other long term (current) drug therapy: Secondary | ICD-10-CM | POA: Diagnosis not present

## 2018-05-20 NOTE — Assessment & Plan Note (Addendum)
1.  Thrombocytosis: - Myeloproliferative work-up negative for Jak 2/exon 12/CALR/MPL. -Denies any vasomotor symptoms like erythromelalgia or aquagenic pruritus. - Labs reviewed.  Platelet count today is 419.  This has come down from 474 at last visit.  Ferritin was 71. - I have reviewed her platelet count over the last several years.  It is close to 450.  If there is any significant change in her platelet count, will consider bone marrow aspiration and biopsy.  She will be seen back in 6 months for follow-up.  2.  Left breast cancer: -Diagnosed in 2006, status post lumpectomy. -Reportedly underwent chemotherapy followed by radiation.  Did not receive antiestrogen therapy. - Mammogram dated 05/13/2018 reviewed by me shows BI-RADS Category 1.  3.  Hypokalemia: -She is on 20 mEq of potassium daily.  Potassium today is 3.7.

## 2018-05-20 NOTE — Progress Notes (Signed)
[address] [place], [place] [address]   CLINIC:  Medical Oncology/Hematology  PCP:  Fayrene Helper, MD [address] [place] [address] [phone]   REASON FOR VISIT:  Follow-up for thrombocytosis  CURRENT THERAPY: observation   INTERVAL HISTORY:  Ms. Crupi 57 y.o. female returns for routine follow-up for thrombocytosis. Patient has numbness on her left side occasionally this is stable at this time. She has no other complaint at this time. Patent denies any headaches or vision changes. Denies any new pains. Denies any nausea, vomiting, or diarrhea. She reports her appetite at 100% and is maintaining her weight. Her energy level is 75%. She lives at home and performs all her own ADLs and activities.     REVIEW OF SYSTEMS:  Review of Systems  Neurological: Positive for numbness (left side).  All other systems reviewed and are negative.    PAST MEDICAL/SURGICAL HISTORY:  Past Medical History:  Diagnosis Date  . Adenocarcinoma of breast ([place])    left   . Heel spur   . Hyperlipidemia   . Hypertension   . Migraines   . Obesity    Past Surgical History:  Procedure Laterality Date  . BREAST SURGERY    . btl  1992  . CHOLECYSTECTOMY    . COLONOSCOPY  09/15/2011   Procedure: COLONOSCOPY;  Surgeon: Dorothyann Peng, MD;  Location: [facility];  Service: Endoscopy;  Laterality: N/A;  9:15 AM  . left lumpectomy and lymph node disection  2000  . r heel surgery for spur and chipped bone  11/09/20099  . TUBAL LIGATION       SOCIAL HISTORY:  Social History   Socioeconomic History  . Marital status: Married    Spouse name: Not on file  . Number of children: Not on file  . Years of education: Not on file  . Highest education level: Not on file  Occupational History  . Not on file  Social Needs  . Financial resource strain: Not on file  . Food insecurity:    Worry: Not on file    Inability: Not on file  .  Transportation needs:    Medical: Not on file    Non-medical: Not on file  Tobacco Use  . Smoking status: Current Every Day Smoker    Packs/day: 0.25    Years: 15.00    Pack years: 3.75    Types: Cigarettes  . Smokeless tobacco: Never Used  . Tobacco comment: smoking 3 per day  Substance and Sexual Activity  . Alcohol use: No    Alcohol/week: 0.0 standard drinks  . Drug use: No    Comment: pt states "3 cigarrettes per day"  . Sexual activity: Yes    Birth control/protection: Post-menopausal  Lifestyle  . Physical activity:    Days per week: Not on file    Minutes per session: Not on file  . Stress: Not on file  Relationships  . Social connections:    Talks on phone: Not on file    Gets together: Not on file    Attends religious service: Not on file    Active member of club or organization: Not on file    Attends meetings of clubs or organizations: Not on file    Relationship status: Not on file  . Intimate partner violence:    Fear of current or ex partner: Not on file    Emotionally abused: Not on file  Physically abused: Not on file    Forced sexual activity: Not on file  Other Topics Concern  . Not on file  Social History Narrative  . Not on file    FAMILY HISTORY:  Family History  Problem Relation Age of Onset  . Heart attack Mother   . Heart attack Father   . Glaucoma Father   . Hypertension Father   . Diabetes Sister   . Anesthesia problems Neg Hx   . Hypotension Neg Hx   . Malignant hyperthermia Neg Hx   . Pseudochol deficiency Neg Hx   . Migraines Neg Hx     CURRENT MEDICATIONS:  Outpatient Encounter Medications as of 05/20/2018  Medication Sig Note  . amLODipine (NORVASC) 2.5 MG tablet TAKE 1 TABLET BY MOUTH ONCE DAILY   . Biotin 1000 MCG tablet Take 1,000 mcg by mouth daily.    . fluticasone (FLONASE) 50 MCG/ACT nasal spray Place 1 spray into both nostrils daily. 12/13/2017: Patient has if needed but has not had to take recently  .  hydrochlorothiazide (HYDRODIURIL) 25 MG tablet TAKE 1 TABLET BY MOUTH DAILY   . ibuprofen (ADVIL,MOTRIN) 800 MG tablet TAKE 1 TABLET BY MOUTH 3 TIMES DAILY.   Marland Kitchen potassium chloride SA (K-DUR,KLOR-CON) 20 MEQ tablet Take 1 tablet (20 mEq total) by mouth daily.   . [DISCONTINUED] ciprofloxacin (CIPRO) 500 MG tablet Take 1 tablet (500 mg total) by mouth 2 (two) times daily.    No facility-administered encounter medications on file as of 05/20/2018.     ALLERGIES:  Allergies  Allergen Reactions  . Ace Inhibitors Cough    REACTION: cough     PHYSICAL EXAM:  ECOG Performance status: 1  Vitals:   05/20/18 1547  BP: 140/83  Pulse: 83  Resp: 16  Temp: 99.2 F (37.3 C)  SpO2: 98%   Filed Weights   05/20/18 1547  Weight: 191 lb 4.8 oz (86.8 kg)    Physical Exam  Constitutional: She is oriented to person, place, and time. She appears well-developed and well-nourished.  Cardiovascular: Normal rate, regular rhythm and normal heart sounds.  Pulmonary/Chest: Effort normal and breath sounds normal.  Neurological: She is alert and oriented to person, place, and time.  Skin: Skin is warm and dry.  Psychiatric: She has a normal mood and affect. Her behavior is normal. Judgment and thought content normal.  Abdomen: Soft, nontender with no palpable hepatosplenomegaly.   LABORATORY DATA:  I have reviewed the labs as listed.  CBC    Component Value Date/Time   WBC 8.1 05/17/2018 1108   RBC 4.44 05/17/2018 1108   HGB 13.7 05/17/2018 1108   HGB 13.7 04/09/2017 1445   HCT 41.4 05/17/2018 1108   HCT 39.9 04/09/2017 1445   PLT 419 (H) 05/17/2018 1108   PLT 478 (H) 04/09/2017 1445   MCV 93.2 05/17/2018 1108   MCV 88 04/09/2017 1445   MCH 30.9 05/17/2018 1108   MCHC 33.1 05/17/2018 1108   RDW 14.1 05/17/2018 1108   RDW 14.9 04/09/2017 1445   LYMPHSABS 3.2 05/17/2018 1108   LYMPHSABS 4.5 (H) 04/09/2017 1445   MONOABS 0.5 05/17/2018 1108   EOSABS 0.3 05/17/2018 1108   EOSABS 0.2  04/09/2017 1445   BASOSABS 0.0 05/17/2018 1108   BASOSABS 0.1 04/09/2017 1445   CMP Latest Ref Rng & Units 05/17/2018 12/13/2017 12/13/2017  Glucose 70 - 99 mg/dL 85 91 92  BUN 6 - 20 mg/dL 11 15 16   Creatinine 0.44 - 1.00 mg/dL 0.65  0.50 0.60  Sodium 135 - 145 mmol/L 142 142 143  Potassium 3.5 - 5.1 mmol/L 3.7 3.4(L) 3.4(L)  Chloride 98 - 111 mmol/L 105 103 104  CO2 22 - 32 mmol/L 29 - -  Calcium 8.9 - 10.3 mg/dL 9.7 - -  Total Protein 6.5 - 8.1 g/dL 7.3 - -  Total Bilirubin 0.3 - 1.2 mg/dL 0.5 - -  Alkaline Phos 38 - 126 U/L 61 - -  AST 15 - 41 U/L 15 - -  ALT 0 - 44 U/L 15 - -       DIAGNOSTIC IMAGING:  I have reviewed her mammogram independently dated 05/13/2018.     ASSESSMENT & PLAN:   Thrombocytosis ([place]) 1.  Thrombocytosis: - Myeloproliferative work-up negative for Jak 2/exon 12/CALR/MPL. -Denies any vasomotor symptoms like erythromelalgia or aquagenic pruritus. - Labs reviewed.  Platelet count today is 419.  This has come down from 474 at last visit.  Ferritin was 71. - I have reviewed her platelet count over the last several years.  It is close to 450.  If there is any significant change in her platelet count, will consider bone marrow aspiration and biopsy.  She will be seen back in 6 months for follow-up.  2.  Left breast cancer: -Diagnosed in 2006, status post lumpectomy. -Reportedly underwent chemotherapy followed by radiation.  Did not receive antiestrogen therapy. - Mammogram dated 05/13/2018 reviewed by me shows BI-RADS Category 1.  3.  Hypokalemia: -She is on 20 mEq of potassium daily.  Potassium today is 3.7.      Orders placed this encounter:  Orders Placed This Encounter  Procedures  . Lactate dehydrogenase  . CBC with Differential/Platelet  . Comprehensive metabolic panel  . Ferritin  . Iron and TIBC  . Folate  . Vitamin B12  . C-reactive protein      Derek Jack, MD [place] [phone]

## 2018-05-20 NOTE — Patient Instructions (Signed)
[facility] at [facility] Discharge Instructions  Follow up in 6 months with labs a few days prior.    Thank you for choosing [facility] at [facility] to provide your oncology and hematology care.  To afford each patient quality time with our provider, please arrive at least 15 minutes before your scheduled appointment time.   If you have a lab appointment with the Cancer Center please come in thru the  Main Entrance and check in at the main information desk  You need to re-schedule your appointment should you arrive 10 or more minutes late.  We strive to give you quality time with our providers, and arriving late affects you and other patients whose appointments are after yours.  Also, if you no show three or more times for appointments you may be dismissed from the clinic at the providers discretion.     Again, thank you for choosing  Cancer Center.  Our hope is that these requests will decrease the amount of time that you wait before being seen by our physicians.       _____________________________________________________________  Should you have questions after your visit to  Cancer Center, please contact our office at [phone] between the hours of 8:00 a.m. and 4:30 p.m.  Voicemails left after 4:00 p.m. will not be returned until the following business day.  For prescription refill requests, have your pharmacy contact our office and allow 72 hours.    Cancer Center Support Programs:   > Cancer Support Group  2nd Tuesday of the month 1pm-2pm, Journey Room    

## 2018-06-12 ENCOUNTER — Other Ambulatory Visit: Payer: Self-pay | Admitting: Family Medicine

## 2018-06-12 MED FILL — IBUPROFEN 800 MG TAB: 800 | 7 days supply | Qty: 21 | Fill #0

## 2018-06-27 ENCOUNTER — Other Ambulatory Visit: Payer: Self-pay | Admitting: Family Medicine

## 2018-06-27 MED FILL — HYDROCHLOROTHIAZIDE 25 MG T: 25 | 90 days supply | Qty: 90 | Fill #0

## 2018-07-01 DIAGNOSIS — E7849 Other hyperlipidemia: Secondary | ICD-10-CM | POA: Diagnosis not present

## 2018-07-01 DIAGNOSIS — E559 Vitamin D deficiency, unspecified: Secondary | ICD-10-CM | POA: Diagnosis not present

## 2018-07-01 DIAGNOSIS — E8881 Metabolic syndrome: Secondary | ICD-10-CM | POA: Diagnosis not present

## 2018-07-01 DIAGNOSIS — I10 Essential (primary) hypertension: Secondary | ICD-10-CM | POA: Diagnosis not present

## 2018-07-02 LAB — COMPLETE METABOLIC PANEL WITH GFR
AG RATIO: 1.5 (calc) (ref 1.0–2.5)
ALBUMIN MSPROF: 4.3 g/dL (ref 3.6–5.1)
ALKALINE PHOSPHATASE (APISO): 66 U/L (ref 33–130)
ALT: 12 U/L (ref 6–29)
AST: 13 U/L (ref 10–35)
BUN: 12 mg/dL (ref 7–25)
CO2: 29 mmol/L (ref 20–32)
Calcium: 10 mg/dL (ref 8.6–10.4)
Chloride: 103 mmol/L (ref 98–110)
Creat: 0.53 mg/dL (ref 0.50–1.05)
GFR, EST NON AFRICAN AMERICAN: 106 mL/min/{1.73_m2} (ref >=60)
GFR, Est African American: 123 mL/min/{1.73_m2} (ref >=60)
GLOBULIN: 2.9 g/dL (ref 1.9–3.7)
Glucose, Bld: 84 mg/dL (ref 65–99)
POTASSIUM: 4.2 mmol/L (ref 3.5–5.3)
SODIUM: 140 mmol/L (ref 135–146)
Total Bilirubin: 0.5 mg/dL (ref 0.2–1.2)
Total Protein: 7.2 g/dL (ref 6.1–8.1)

## 2018-07-02 LAB — HEMOGLOBIN A1C
HEMOGLOBIN A1C: 5.9 %{Hb} — AB (ref <5.7)
Mean Plasma Glucose: 123 (calc)
eAG (mmol/L): 6.8 (calc)

## 2018-07-02 LAB — TSH: TSH: 1.62 m[IU]/L (ref 0.40–4.50)

## 2018-07-02 LAB — LIPID PANEL
CHOL/HDL RATIO: 3.8 (calc) (ref <5.0)
Cholesterol: 219 mg/dL — ABNORMAL HIGH (ref <200)
HDL: 57 mg/dL (ref >50)
LDL CHOLESTEROL (CALC): 140 mg/dL — AB
Non-HDL Cholesterol (Calc): 162 mg/dL (calc) — ABNORMAL HIGH (ref <130)
Triglycerides: 106 mg/dL (ref <150)

## 2018-07-02 LAB — VITAMIN D 25 HYDROXY (VIT D DEFICIENCY, FRACTURES): VIT D 25 HYDROXY: 46 ng/mL (ref 30–100)

## 2018-07-08 ENCOUNTER — Other Ambulatory Visit: Payer: Self-pay | Admitting: Family Medicine

## 2018-07-08 ENCOUNTER — Encounter: Payer: Self-pay | Admitting: Family Medicine

## 2018-07-08 ENCOUNTER — Ambulatory Visit (INDEPENDENT_AMBULATORY_CARE_PROVIDER_SITE_OTHER): Payer: 59 | Admitting: Family Medicine

## 2018-07-08 VITALS — BP 120/82 | HR 85 | Resp 12 | Ht 62.0 in | Wt 195.1 lb

## 2018-07-08 DIAGNOSIS — D126 Benign neoplasm of colon, unspecified: Secondary | ICD-10-CM | POA: Diagnosis not present

## 2018-07-08 DIAGNOSIS — I10 Essential (primary) hypertension: Secondary | ICD-10-CM | POA: Diagnosis not present

## 2018-07-08 DIAGNOSIS — E7849 Other hyperlipidemia: Secondary | ICD-10-CM | POA: Diagnosis not present

## 2018-07-08 DIAGNOSIS — Z0000 Encounter for general adult medical examination without abnormal findings: Secondary | ICD-10-CM | POA: Diagnosis not present

## 2018-07-08 DIAGNOSIS — R7303 Prediabetes: Secondary | ICD-10-CM | POA: Diagnosis not present

## 2018-07-08 DIAGNOSIS — F17218 Nicotine dependence, cigarettes, with other nicotine-induced disorders: Secondary | ICD-10-CM

## 2018-07-08 DIAGNOSIS — F17210 Nicotine dependence, cigarettes, uncomplicated: Secondary | ICD-10-CM

## 2018-07-08 DIAGNOSIS — E669 Obesity, unspecified: Secondary | ICD-10-CM

## 2018-07-08 NOTE — Patient Instructions (Addendum)
F/u in 4 months, with shingrix #1 .call if you need me before  Fasting lipid, cmp and EGFr and GhBA1C 1 week before f/u Please decide on quitting smoking , it's only a bad habit now  I will let you know dr Nona Dell response re colon screening  It is important that you exercise regularly at least 30 minutes 5 times a week. If you develop chest pain, have severe difficulty breathing, or feel very tired, stop exercising immediately and seek medical attention  I have asked for guidance re colon screen  Thank you  for choosing [facility]. We consider it a privelige to serve you.  Delivering excellent health care in a caring and  compassionate way is our goal.  Partnering with you,  so that together we can achieve this goal is our strategy.       Steps to Quit Smoking Smoking tobacco can be bad for your health. It can also affect almost every organ in your body. Smoking puts you and people around you at risk for many serious long-lasting (chronic) diseases. Quitting smoking is hard, but it is one of the best things that you can do for your health. It is never too late to quit. What are the benefits of quitting smoking? When you quit smoking, you lower your risk for getting serious diseases and conditions. They can include:  Lung cancer or lung disease.  Heart disease.  Stroke.  Heart attack.  Not being able to have children (infertility).  Weak bones (osteoporosis) and broken bones (fractures).  If you have coughing, wheezing, and shortness of breath, those symptoms may get better when you quit. You may also get sick less often. If you are pregnant, quitting smoking can help to lower your chances of having a baby of low birth weight. What can I do to help me quit smoking? Talk with your doctor about what can help you quit smoking. Some things you can do (strategies) include:  Quitting smoking totally, instead of slowly cutting back how much you smoke over a period of  time.  Going to in-person counseling. You are more likely to quit if you go to many counseling sessions.  Using resources and support systems, such as: ? Database administrator with a Social worker. ? Phone quitlines. ? Careers information officer. ? Support groups or group counseling. ? Text messaging programs. ? Mobile phone apps or applications.  Taking medicines. Some of these medicines may have nicotine in them. If you are pregnant or breastfeeding, do not take any medicines to quit smoking unless your doctor says it is okay. Talk with your doctor about counseling or other things that can help you.  Talk with your doctor about using more than one strategy at the same time, such as taking medicines while you are also going to in-person counseling. This can help make quitting easier. What things can I do to make it easier to quit? Quitting smoking might feel very hard at first, but there is a lot that you can do to make it easier. Take these steps:  Talk to your family and friends. Ask them to support and encourage you.  Call phone quitlines, reach out to support groups, or work with a Social worker.  Ask people who smoke to not smoke around you.  Avoid places that make you want (trigger) to smoke, such as: ? Bars. ? Parties. ? Smoke-break areas at work.  Spend time with people who do not smoke.  Lower the stress in your life.  Stress can make you want to smoke. Try these things to help your stress: ? Getting regular exercise. ? Deep-breathing exercises. ? Yoga. ? Meditating. ? Doing a body scan. To do this, close your eyes, focus on one area of your body at a time from head to toe, and notice which parts of your body are tense. Try to relax the muscles in those areas.  Download or buy apps on your mobile phone or tablet that can help you stick to your quit plan. There are many free apps, such as QuitGuide from the [employer] Office manager for Disease Control and Prevention). You can find more support from  smokefree.gov and other websites.  This information is not intended to replace advice given to you by your health care provider. Make sure you discuss any questions you have with your health care provider. Document Released: 07/08/2009 Document Revised: 05/09/2016 Document Reviewed: 01/26/2015 Elsevier Interactive Patient Education  2018 [employer].

## 2018-07-08 NOTE — Progress Notes (Signed)
amb gastro  

## 2018-07-08 NOTE — Assessment & Plan Note (Signed)
Annual exam as documented. Counseling done  re healthy lifestyle involving commitment to 150 minutes exercise per week, heart healthy diet, and attaining healthy weight.The importance of adequate sleep also discussed. Changes in health habits are decided on by the patient with goals and time frames  set for achieving them. Immunization and cancer screening needs are specifically addressed at this visit. 

## 2018-07-08 NOTE — Assessment & Plan Note (Signed)
Asked and confirms ongoing nicotine use, cigarettes Asess: not willing to quit Advise: needs to reduce cancer risk as well as heart disease and stroke Arrange : f/u in 3 to 4 months Assist: counseled for 5 mins

## 2018-07-08 NOTE — Assessment & Plan Note (Addendum)
Noted in 2012, ongoing nicotine use and obesity, conferred with GI ,refer for colonoscopy

## 2018-07-08 NOTE — Progress Notes (Signed)
    [name] [name]     MRN: [id number]      DOB: 1961-07-13  HPI: Patient is in for annual physical exam. No other health concerns are expressed or addressed at the visit. Recent labs, are reviewed.Her cholesterol has increased and blood sugar is uncontrolled Immunization is reviewed , and  She will check on shingrix    PE: BP 120/82   Pulse 85   Resp 12   Ht 5\' 2"  (1.575 m)   Wt 195 lb 1.9 oz (88.5 kg)   SpO2 98% Comment: room air  BMI 35.69 kg/m   Pleasant  female, alert and oriented x 3, in no cardio-pulmonary distress. Afebrile. HEENT No facial trauma or asymetry. Sinuses non tender.  Extra occullar muscles intact, pupils equally reactive to light. External ears normal, tympanic membranes clear. Oropharynx moist, no exudate. Neck: supple, no adenopathy,JVD or thyromegaly.No bruits.  Chest: Clear to ascultation bilaterally.No crackles or wheezes. Non tender to palpation  Breast: Asymetry,no masses or lumps. No tenderness. No nipple discharge or inversion. No axillary or supraclavicular adenopathy  Cardiovascular system; Heart sounds normal,  S1 and  S2 ,no S3.  No murmur, or thrill. Apical beat not displaced Peripheral pulses normal.  Abdomen: Soft, non tender, no organomegaly or masses. No bruits. Bowel sounds normal. No guarding, tenderness or rebound.     Musculoskeletal exam: Full ROM of spine, hips , shoulders and knees. No deformity ,swelling or crepitus noted. No muscle wasting or atrophy.   Neurologic: Cranial nerves 2 to 12 intact. Power, tone ,sensation and reflexes normal throughout. No disturbance in gait. No tremor.  Skin: Intact, no ulceration, erythema , scaling or rash noted. Pigmentation normal throughout  Psych; Normal mood and affect. Judgement and concentration normal   Assessment & Plan:  Annual physical exam Annual exam as documented. Counseling done  re healthy lifestyle involving commitment to 150 minutes exercise per  week, heart healthy diet, and attaining healthy weight.The importance of adequate sleep also discussed.  Changes in health habits are decided on by the patient with goals and time frames  set for achieving them. Immunization and cancer screening needs are specifically addressed at this visit.   Tubular adenoma of colon Noted in 2012, ongoing nicotine use and obesity, conferred with GI ,refer for colonoscopy  Nicotine dependence Asked and confirms ongoing nicotine use, cigarettes Asess: not willing to quit Advise: needs to reduce cancer risk as well as heart disease and stroke Arrange : f/u in 3 to 4 months Assist: counseled for 5 mins

## 2018-07-16 ENCOUNTER — Other Ambulatory Visit: Payer: Self-pay | Admitting: Family Medicine

## 2018-07-22 MED FILL — IBUPROFEN 800 MG TAB: 800 | 21 days supply | Qty: 21 | Fill #0

## 2018-07-26 ENCOUNTER — Other Ambulatory Visit: Payer: Self-pay | Admitting: Family Medicine

## 2018-07-29 ENCOUNTER — Other Ambulatory Visit: Payer: Self-pay | Admitting: Family Medicine

## 2018-07-29 DIAGNOSIS — I10 Essential (primary) hypertension: Secondary | ICD-10-CM

## 2018-07-29 MED FILL — FLUTICASONE PROP 50 MCG SPR: 50 | 90 days supply | Qty: 48 | Fill #0

## 2018-07-29 MED FILL — AMLODIPINE 2.5 MG TABLET: 2.5 | 90 days supply | Qty: 90 | Fill #0

## 2018-07-29 MED FILL — POTASSIUM CL ER 20 MEQ TAB: 20 | 90 days supply | Qty: 90 | Fill #0

## 2018-07-30 ENCOUNTER — Ambulatory Visit: Payer: 59

## 2018-08-12 ENCOUNTER — Ambulatory Visit (INDEPENDENT_AMBULATORY_CARE_PROVIDER_SITE_OTHER): Payer: Self-pay

## 2018-08-12 DIAGNOSIS — Z86010 Personal history of colonic polyps: Secondary | ICD-10-CM

## 2018-08-12 MED ORDER — CLENPIQ 10-3.5-12 MG-GM -GM/160ML PO SOLN: 1.0000 | Freq: Once | ORAL | 0 refills | Status: DC

## 2018-08-12 MED FILL — CLENPIQ 10-3.5-12 MG-GM -GM: 10-3.5-12 M | 1 days supply | Qty: 320 | Fill #0

## 2018-08-12 NOTE — Progress Notes (Addendum)
Gastroenterology Pre-Procedure Review  Request Date:08/12/18 Requesting Physician: Dr.Simpson- last tcs 09/15/11 [facility] tubular adenoma  PATIENT REVIEW QUESTIONS: The patient responded to the following health history questions as indicated:    1. Diabetes Melitis: no 2. Joint replacements in the past 12 months: no 3. Major health problems in the past 3 months: no 4. Has an artificial valve or MVP: no 5. Has a defibrillator: no 6. Has been advised in past to take antibiotics in advance of a procedure like teeth cleaning: no 7. Family history of colon cancer: no  8. Alcohol Use: no 9. History of sleep apnea: no  10. History of coronary artery or other vascular stents placed within the last 12 months: no 11. History of any prior anesthesia complications: no    MEDICATIONS & ALLERGIES:    Patient reports the following regarding taking any blood thinners:   Plavix? no Aspirin? no Coumadin? no Brilinta? no Xarelto? no Eliquis? no Pradaxa? no Savaysa? no Effient? no  Patient confirms/reports the following medications:  Current Outpatient Medications  Medication Sig Dispense Refill  . amLODipine (NORVASC) 2.5 MG tablet TAKE 1 TABLET BY MOUTH ONCE DAILY 90 tablet 1  . Biotin 1000 MCG tablet Take 1,000 mcg by mouth daily.     . fluticasone (FLONASE) 50 MCG/ACT nasal spray Place 1 spray into both nostrils daily.    . hydrochlorothiazide (HYDRODIURIL) 25 MG tablet TAKE 1 TABLET BY MOUTH DAILY 90 tablet 0  . ibuprofen (ADVIL,MOTRIN) 800 MG tablet Take one tab daily as needed- max of three per week 21 tablet 0  . potassium chloride SA (K-DUR,KLOR-CON) 20 MEQ tablet Take 1 tablet (20 mEq total) by mouth daily. 90 tablet 1  . fluticasone (FLONASE) 50 MCG/ACT nasal spray USE 2 SPRAYS IN EACH NOSTRIL ONCE DAILY 48 g 1  . potassium chloride SA (K-DUR,KLOR-CON) 20 MEQ tablet TAKE 1 TABLET (20 MEQ TOTAL) BY MOUTH DAILY. 90 tablet 2   No current facility-administered medications for this visit.      Patient confirms/reports the following allergies:  Allergies  Allergen Reactions  . Ace Inhibitors Cough    REACTION: cough    No orders of the defined types were placed in this encounter.   AUTHORIZATION INFORMATION Primary Insurance: [place],  [place] #: [id number] Pre-Cert / Josem Kaufmann required: no Pre-Cert / Auth #: [id number] per Renita   SCHEDULE INFORMATION: Procedure has been scheduled as follows:  Date: 08/16/18, Time: 3:00  Location: [facility] Dr.Fields  This Gastroenterology Pre-Precedure Review Form is being routed to the following provider(s): Roseanne Kaufman NP

## 2018-08-12 NOTE — Patient Instructions (Signed)
[facility] INSTRUCTIONS   Patient Name:  [name] [name] Date of procedure:  08/16/18 Time to register at [facility]:  2:00 Provider:  Dr. Oneida Alar   Please notify us immediately if you are diabetic, take iron supplements, or if you are on coumadin or any blood thinners.   Note: Do NOT refrigerate or freeze CLENPIQ. CLENPIQ is ready to drink. There is no need to add any other liquid or mix the medicine in the bottle before you start dosing.   08/15/18  1 Day prior to procedure:     CLEAR LIQUIDS ALL DAY--NO SOLID FOODS OR DAIRY PRODUCTS! See list of liquids that are allowed and items that are NOT allowed below.     You must drink plenty of CLEAR LIQUIDS starting before your bowel prep. It is important to stay adequately hydrated before, during, and after your bowel prep for the prep to work effectively!    At 5:00 PM Begin the prep as follows:    1. Drink one bottle of premixed CLENPIQ right from the bottle. 2. Drink at least five (5) 8-ounce drinks of clear liquids of your choice within the next 5 hours   Continue clear liquids.    08/16/18 Day of Procedure    You may take TYLENOL products. Please continue your regular medications unless we have instructed you otherwise.    5 hours before procedure @ : 10:00am 1. Drink second bottle of premixed CLENPIQ right from the bottle.   2. Drink at least three (3) 8-ounce drinks of clear liquids of your choice within the next 2 hours. You can drink more if needed.   3 hours before your procedure time @ 12:00 pm: Stop drinking all liquids, nothing by mouth at this point.  Please note, on the day of your procedure you MUST be accompanied by an adult who is willing to assume responsibility for you at time of discharge. If you do not have such person with you, your procedure will have to be rescheduled.                                                                                                                      Please leave ALL jewelry at home prior to coming to the hospital for your procedure.   *It is your responsibility to check with your insurance company for the benefits of coverage you have for this procedure. Unfortunately, not all insurance companies have benefits to cover all or part of these types of procedures. It is your responsibility to check your benefits, however we will be glad to assist you with any codes your insurance company may need.   Please note that most insurance companies will not cover a screening colonoscopy for people under the age of 44  For example, with some insurance companies you may have benefits for a screening colonoscopy, but if polyps are found the diagnosis will change and then you may have a deductible that will need to be  met. Please make sure you check your benefits for screening colonoscopy as well as a diagnostic colonoscopy.   CLEAR LIQUIDS: (NO RED or PURPLE) Water  Jello   Apple Juice  White Grape Juice   Kool-Aid Soft drinks  Banana popsicles Sports Drink  Black coffee (No cream or milk) Tea (No cream or milk)  Broth (fat free beef/chicken/vegetable)  Clear liquids allow you to see your fingers on the other side of the glass.  Be sure they are NOT RED or PURPLE in color, cloudy, but CLEAR.  Do Not Eat: Dairy products of any kind Cranberry juice Tomato or V8 Juice  Orange Juice   Grapefruit Juice Red Grape Juice Alcohol   Non-dairy creamer Solid foods like cereal, oatmeal, yogurt, fruits, vegetables, creamed soups, eggs, bread, etc   HELPFUL HINTS TO MAKE DRINKING EASIER: -Trying drinking through a straw. -If you become nauseated, try consuming smaller amounts or stretch out the time between glasses.  Stop for 30 minutes & slowly start back drinking.  Call our office with any questions or concerns at [phone].  Thank You

## 2018-08-13 NOTE — Progress Notes (Signed)
Appropriate.

## 2018-08-16 ENCOUNTER — Encounter (HOSPITAL_COMMUNITY)
Admission: RE | Disposition: A | Discharge status: Home / Self Care | Payer: Self-pay | Source: Ambulatory Visit | Attending: Gastroenterology

## 2018-08-16 ENCOUNTER — Ambulatory Visit (HOSPITAL_COMMUNITY)
Admission: RE | Admit: 2018-08-16 | Discharge: 2018-08-16 | Disposition: A | Discharge status: Home / Self Care | Payer: 59 | Source: Ambulatory Visit | Attending: Gastroenterology | Admitting: Gastroenterology

## 2018-08-16 ENCOUNTER — Encounter (HOSPITAL_COMMUNITY): Payer: Self-pay | Admitting: *Deleted

## 2018-08-16 ENCOUNTER — Other Ambulatory Visit: Payer: Self-pay

## 2018-08-16 DIAGNOSIS — Q438 Other specified congenital malformations of intestine: Secondary | ICD-10-CM | POA: Diagnosis not present

## 2018-08-16 DIAGNOSIS — Z79899 Other long term (current) drug therapy: Secondary | ICD-10-CM | POA: Diagnosis not present

## 2018-08-16 DIAGNOSIS — Z1211 Encounter for screening for malignant neoplasm of colon: Secondary | ICD-10-CM | POA: Diagnosis not present

## 2018-08-16 DIAGNOSIS — I10 Essential (primary) hypertension: Secondary | ICD-10-CM | POA: Insufficient documentation

## 2018-08-16 DIAGNOSIS — K5730 Diverticulosis of large intestine without perforation or abscess without bleeding: Secondary | ICD-10-CM | POA: Diagnosis not present

## 2018-08-16 DIAGNOSIS — Z860100 Personal history of colon polyps, unspecified: Secondary | ICD-10-CM

## 2018-08-16 DIAGNOSIS — E785 Hyperlipidemia, unspecified: Secondary | ICD-10-CM | POA: Insufficient documentation

## 2018-08-16 DIAGNOSIS — Z853 Personal history of malignant neoplasm of breast: Secondary | ICD-10-CM | POA: Diagnosis not present

## 2018-08-16 DIAGNOSIS — D122 Benign neoplasm of ascending colon: Secondary | ICD-10-CM | POA: Insufficient documentation

## 2018-08-16 DIAGNOSIS — F17210 Nicotine dependence, cigarettes, uncomplicated: Secondary | ICD-10-CM | POA: Diagnosis not present

## 2018-08-16 DIAGNOSIS — D124 Benign neoplasm of descending colon: Secondary | ICD-10-CM | POA: Insufficient documentation

## 2018-08-16 HISTORY — PX: COLONOSCOPY: SHX5424

## 2018-08-16 HISTORY — PX: POLYPECTOMY: SHX5525

## 2018-08-16 SURGERY — COLONOSCOPY
Anesthesia: Moderate Sedation

## 2018-08-16 MED ORDER — MEPERIDINE HCL 100 MG/ML IJ SOLN
INTRAMUSCULAR | Status: DC | PRN
  Administered 2018-08-16 (×3): 25 mg

## 2018-08-16 MED ORDER — MIDAZOLAM HCL 5 MG/5ML IJ SOLN
INTRAMUSCULAR | Status: AC
  Filled 2018-08-16: qty 10

## 2018-08-16 MED ORDER — SODIUM CHLORIDE 0.9 % IV SOLN
INTRAVENOUS | Status: DC
  Administered 2018-08-16: 15:00:00 via INTRAVENOUS

## 2018-08-16 MED ORDER — STERILE WATER FOR IRRIGATION IR SOLN
Status: DC | PRN
  Administered 2018-08-16: 1.5 mL

## 2018-08-16 MED ORDER — MIDAZOLAM HCL 5 MG/5ML IJ SOLN
INTRAMUSCULAR | Status: DC | PRN
  Administered 2018-08-16 (×4): 2 mg via INTRAVENOUS

## 2018-08-16 MED ORDER — MEPERIDINE HCL 100 MG/ML IJ SOLN
INTRAMUSCULAR | Status: AC
  Filled 2018-08-16: qty 2

## 2018-08-16 NOTE — Discharge Instructions (Signed)
You have small internal hemorrhoids and diverticulosis IN YOUR LEFT AND RIGHT COLON. YOU HAD FOUR SMALL POLYPS REMOVED.    DRINK WATER TO KEEP YOUR URINE LIGHT YELLOW.  CONTINUE YOUR WEIGHT LOSS EFFORTS. YOUR BODY MASS INDEX IS OVER 30 WHICH MEANS YOU ARE OBESE. OBESITY IS ASSOCIATED WITH AN INCREASE FOR ALL CANCERS, INCLUDING ESOPHAGEAL AND COLON CANCER.  FOLLOW A HIGH FIBER DIET. AVOID ITEMS THAT CAUSE BLOATING. See info below.  YOUR BIOPSY RESULTS WILL BE BACK IN 5 BUSINESS DAYS.  USE PREPARATION H FOUR TIMES  A DAY IF NEEDED TO RELIEVE RECTAL PAIN/PRESSURE/BLEEDING.  Next colonoscopy in 3-10 years.  Colonoscopy Care After Read the instructions outlined below and refer to this sheet in the next week. These discharge instructions provide you with general information on caring for yourself after you leave the hospital. While your treatment has been planned according to the most current medical practices available, unavoidable complications occasionally occur. If you have any problems or questions after discharge, call DR. Toshie Demelo, [phone].  ACTIVITY  You may resume your regular activity, but move at a slower pace for the next 24 hours.   Take frequent rest periods for the next 24 hours.   Walking will help get rid of the air and reduce the bloated feeling in your belly (abdomen).   No driving for 24 hours (because of the medicine (anesthesia) used during the test).   You may shower.   Do not sign any important legal documents or operate any machinery for 24 hours (because of the anesthesia used during the test).    NUTRITION  Drink plenty of fluids.   You may resume your normal diet as instructed by your doctor.   Begin with a light meal and progress to your normal diet. Heavy or fried foods are harder to digest and may make you feel sick to your stomach (nauseated).   Avoid alcoholic beverages for 24 hours or as instructed.    MEDICATIONS  You may resume your  normal medications.   WHAT YOU CAN EXPECT TODAY  Some feelings of bloating in the abdomen.   Passage of more gas than usual.   Spotting of blood in your stool or on the toilet paper  .  IF YOU HAD POLYPS REMOVED DURING THE COLONOSCOPY:  Eat a soft diet IF YOU HAVE NAUSEA, BLOATING, ABDOMINAL PAIN, OR VOMITING.    FINDING OUT THE RESULTS OF YOUR TEST Not all test results are available during your visit. DR. Oneida Alar WILL CALL YOU WITHIN 14 DAYS OF YOUR PROCEDUE WITH YOUR RESULTS. Do not assume everything is normal if you have not heard from DR. Jontavious Commons, CALL HER OFFICE AT [phone].  SEEK IMMEDIATE MEDICAL ATTENTION AND CALL THE OFFICE: [phone] IF:  You have more than a spotting of blood in your stool.   Your belly is swollen (abdominal distention).   You are nauseated or vomiting.   You have a temperature over 101F.   You have abdominal pain or discomfort that is severe or gets worse throughout the day.  High-Fiber Diet A high-fiber diet changes your normal diet to include more whole grains, legumes, fruits, and vegetables. Changes in the diet involve replacing refined carbohydrates with unrefined foods. The calorie level of the diet is essentially unchanged. The Dietary Reference Intake (recommended amount) for adult males is 38 grams per day. For adult females, it is 25 grams per day. Pregnant and lactating women should consume 28 grams of fiber per day. Fiber is the intact part  of a plant that is not broken down during digestion. Functional fiber is fiber that has been isolated from the plant to provide a beneficial effect in the body.  PURPOSE  Increase stool bulk.   Ease and regulate bowel movements.   Lower cholesterol.   REDUCE RISK OF COLON CANCER  INDICATIONS THAT YOU NEED MORE FIBER  Constipation and hemorrhoids.   Uncomplicated diverticulosis (intestine condition) and irritable bowel syndrome.   Weight management.   As a protective measure against  hardening of the arteries (atherosclerosis), diabetes, and cancer.   GUIDELINES FOR INCREASING FIBER IN THE DIET  Start adding fiber to the diet slowly. A gradual increase of about 5 more grams (2 slices of whole-wheat bread, 2 servings of most fruits or vegetables, or 1 bowl of high-fiber cereal) per day is best. Too rapid an increase in fiber may result in constipation, flatulence, and bloating.   Drink enough water and fluids to keep your urine clear or pale yellow. Water, juice, or caffeine-free drinks are recommended. Not drinking enough fluid may cause constipation.   Eat a variety of high-fiber foods rather than one type of fiber.   Try to increase your intake of fiber through using high-fiber foods rather than fiber pills or supplements that contain small amounts of fiber.   The goal is to change the types of food eaten. Do not supplement your present diet with high-fiber foods, but replace foods in your present diet.   INCLUDE A VARIETY OF FIBER SOURCES  Replace refined and processed grains with whole grains, canned fruits with fresh fruits, and incorporate other fiber sources. White rice, white breads, and most bakery goods contain little or no fiber.   Brown whole-grain rice, buckwheat oats, and many fruits and vegetables are all good sources of fiber. These include: broccoli, Brussels sprouts, cabbage, cauliflower, beets, sweet potatoes, white potatoes (skin on), carrots, tomatoes, eggplant, squash, berries, fresh fruits, and dried fruits.   Cereals appear to be the richest source of fiber. Cereal fiber is found in whole grains and bran. Bran is the fiber-rich outer coat of cereal grain, which is largely removed in refining. In whole-grain cereals, the bran remains. In breakfast cereals, the largest amount of fiber is found in those with "bran" in their names. The fiber content is sometimes indicated on the label.   You may need to include additional fruits and vegetables each day.     In baking, for 1 cup white flour, you may use the following substitutions:   1 cup whole-wheat flour minus 2 tablespoons.   1/2 cup white flour plus 1/2 cup whole-wheat flour.   Polyps, Colon  A polyp is extra tissue that grows inside your body. Colon polyps grow in the large intestine. The large intestine, also called the colon, is part of your digestive system. It is a long, hollow tube at the end of your digestive tract where your body makes and stores stool. Most polyps are not dangerous. They are benign. This means they are not cancerous. But over time, some types of polyps can turn into cancer. Polyps that are smaller than a pea are usually not harmful. But larger polyps could someday become or may already be cancerous. To be safe, doctors remove all polyps and test them.   PREVENTION There is not one sure way to prevent polyps. You might be able to lower your risk of getting them if you:  Eat more fruits and vegetables and less fatty food.   Do not  smoke.   Avoid alcohol.   Exercise every day.   Lose weight if you are overweight.   Eating more calcium and folate can also lower your risk of getting polyps. Some foods that are rich in calcium are milk, cheese, and broccoli. Some foods that are rich in folate are chickpeas, kidney beans, and spinach.    Diverticulosis Diverticulosis is a common condition that develops when small pouches (diverticula) form in the wall of the colon. The risk of diverticulosis increases with age. It happens more often in people who eat a low-fiber diet. Most individuals with diverticulosis have no symptoms. Those individuals with symptoms usually experience belly (abdominal) pain, constipation, or loose stools (diarrhea).  HOME CARE INSTRUCTIONS  Increase the amount of fiber in your diet as directed by your caregiver or dietician. This may reduce symptoms of diverticulosis.   Drink at least 6 to 8 glasses of water each day to prevent  constipation.   Try not to strain when you have a bowel movement.   Avoiding nuts and seeds to prevent complications is NOT NECESSARY.   FOODS HAVING HIGH FIBER CONTENT INCLUDE:  Fruits. Apple, peach, pear, tangerine, raisins, prunes.   Vegetables. Brussels sprouts, asparagus, broccoli, cabbage, carrot, cauliflower, romaine lettuce, spinach, summer squash, tomato, winter squash, zucchini.   Starchy Vegetables. Baked beans, kidney beans, lima beans, split peas, lentils, potatoes (with skin).   Grains. Whole wheat bread, brown rice, bran flake cereal, plain oatmeal, white rice, shredded wheat, bran muffins.   SEEK IMMEDIATE MEDICAL CARE IF:  You develop increasing pain or severe bloating.   You have an oral temperature above 101F.   You develop vomiting or bowel movements that are bloody or black.

## 2018-08-16 NOTE — Brief Op Note (Addendum)
08/16/2018  4:07 PM  PATIENT:  [name] [name]  57 y.o. female  PRE-OPERATIVE DIAGNOSIS:  surveillance  POST-OPERATIVE DIAGNOSIS:  ascending colon polyp times three, descending colon polyp times one  PROCEDURE:  Procedure(s) with comments: COLONOSCOPY (N/A) - 3:00 POLYPECTOMY - ascending colon(CSx3), descending colon(CSx1)  SURGEON:  Surgeon(s) and Role:    * Charlestine Rookstool, Marga Melnick, MD - Primary  REFERRING PROVIDER: [facility]  PROCEDURE: COLONOSCOPY WITH COLD FORCEPS POLYPECTOMY  INDICATION: RECTAL BLEEDING  FINDINGS: 1. NORMAL ILEUM 2. DIVERTICULOSIS in RIGHT/LEFT COLON 3. Four POLYPS REMOVED:  4. REDUNDANT LEFT COLON  RECOMMENDATIONS: CONTINUE YOUR WEIGHT LOSS EFFORTS.  DRINK WATER TO KEEP YOUR URINE LIGHT YELLOW. FOLLOW A HIGH FIBER DIET. AVOID ITEMS THAT CAUSE BLOATING & GAS.  USE PREPARATION H OR PROCTO-PAK FOUR TIMES  A DAY IF NEEDED TO RELIEVE RECTAL PAIN/PRESSURE/BLEEDING. YOUR BIOPSY RESULTS WILL BE BACK IN 5 BUSINESS DAYS.  Next colonoscopy in 5-10 years.    PROCEDURE TECHNIQUE: PHYSICAL EXAM WAS PERFORMED AND CONSENT OBTAINED. SCOPE ADVANCED TO THE ILEUM WITH THE ASSISTANCE OF COLOWRAP.  COLD FORCEPS POLYPECTOMY PERFORMED. THE SCOPE WAS REMOVED BY CAREFULLY EXAMINING THE COLOR, TEXTURE, AND ANATOMY OF THE MUCOSA.  PT WAS RECOVERED AND DISCHARGED HOME IN SATISFACTORY CONDITION.

## 2018-08-16 NOTE — Progress Notes (Signed)
[name] [name] had a medical procedure on 08/16/2018 requiring sedation.  It is advised that she not drive, sign legal documents, etc. For 24 hours following her procedure.     Please excuse her from work 08/17/2018.  Thank you. [name] RN [employer]

## 2018-08-16 NOTE — H&P (Signed)
Primary Care Physician:  Fayrene Helper, MD Primary Gastroenterologist:  Dr. Oneida Alar  Pre-Procedure History & Physical: HPI:  [name] [name] is a 57 y.o. female here for COLON CANCER SCREENING.  Past Medical History:  Diagnosis Date  . Adenocarcinoma of breast ([place])    left   . Heel spur   . Hyperlipidemia   . Hypertension   . Migraines   . Obesity     Past Surgical History:  Procedure Laterality Date  . BREAST SURGERY    . btl  1992  . CHOLECYSTECTOMY    . COLONOSCOPY  09/15/2011   Procedure: COLONOSCOPY;  Surgeon: Dorothyann Peng, MD;  Location: [facility];  Service: Endoscopy;  Laterality: N/A;  9:15 AM  . left lumpectomy and lymph node disection  2000  . r heel surgery for spur and chipped bone  11/09/20099  . TUBAL LIGATION      Prior to Admission medications   Medication Sig Start Date End Date Taking? Authorizing Provider  amLODipine (NORVASC) 2.5 MG tablet TAKE 1 TABLET BY MOUTH ONCE DAILY Patient taking differently: Take 2.5 mg by mouth daily at 6 (six) AM.  07/29/18  Yes Fayrene Helper, MD  Biotin 1000 MCG tablet Take 1,000 mcg by mouth daily at 6 (six) AM.    Yes [provider]  CLENPIQ 10-3.5-12 MG-GM -GM/160ML SOLN Take 1 kit by mouth once for 1 dose. 08/12/18 09/12/18 Yes Annitta Needs, NP  fluticasone (FLONASE) 50 MCG/ACT nasal spray USE 2 SPRAYS IN EACH NOSTRIL ONCE DAILY Patient taking differently: Place 2 sprays into both nostrils daily as needed for allergies.  07/29/18  Yes Fayrene Helper, MD  hydrochlorothiazide (HYDRODIURIL) 25 MG tablet TAKE 1 TABLET BY MOUTH DAILY Patient taking differently: Take 25 mg by mouth daily.  06/27/18  Yes Fayrene Helper, MD  ibuprofen (ADVIL,MOTRIN) 800 MG tablet Take one tab daily as needed- max of three per week Patient taking differently: Take 800 mg by mouth daily as needed (pain.).  07/22/18  Yes Fayrene Helper, MD  potassium chloride SA (K-DUR,KLOR-CON) 20 MEQ tablet TAKE 1 TABLET (20  MEQ TOTAL) BY MOUTH DAILY. Patient taking differently: Take 20 mEq by mouth daily at 6 (six) AM.  07/29/18  Yes Fayrene Helper, MD    Allergies as of 08/12/2018 - Review Complete 08/12/2018  Allergen Reaction Noted  . Ace inhibitors Cough     Family History  Problem Relation Age of Onset  . Heart attack Mother   . Heart attack Father   . Glaucoma Father   . Hypertension Father   . Diabetes Sister   . Anesthesia problems Neg Hx   . Hypotension Neg Hx   . Malignant hyperthermia Neg Hx   . Pseudochol deficiency Neg Hx   . Migraines Neg Hx     Social History   Socioeconomic History  . Marital status: Married    Spouse name: Not on file  . Number of children: Not on file  . Years of education: Not on file  . Highest education level: Not on file  Occupational History  . Not on file  Social Needs  . Financial resource strain: Not on file  . Food insecurity:    Worry: Not on file    Inability: Not on file  . Transportation needs:    Medical: Not on file    Non-medical: Not on file  Tobacco Use  . Smoking status: Current Every Day Smoker  Packs/day: 0.25    Years: 15.00    Pack years: 3.75    Types: Cigarettes  . Smokeless tobacco: Never Used  . Tobacco comment: smoking 3 per day  Substance and Sexual Activity  . Alcohol use: No    Alcohol/week: 0.0 standard drinks  . Drug use: No    Comment: pt states "3 cigarrettes per day"  . Sexual activity: Yes    Birth control/protection: Post-menopausal  Lifestyle  . Physical activity:    Days per week: Not on file    Minutes per session: Not on file  . Stress: Not on file  Relationships  . Social connections:    Talks on phone: Not on file    Gets together: Not on file    Attends religious service: Not on file    Active member of club or organization: Not on file    Attends meetings of clubs or organizations: Not on file    Relationship status: Not on file  . Intimate partner violence:    Fear of current or  ex partner: Not on file    Emotionally abused: Not on file    Physically abused: Not on file    Forced sexual activity: Not on file  Other Topics Concern  . Not on file  Social History Narrative  . Not on file    Review of Systems: See HPI, otherwise negative ROS   Physical Exam: BP 127/82   Pulse 89   Temp 98.7 F (37.1 C) (Oral)   Resp 14   Ht 5' 2"  (1.575 m)   Wt 88.9 kg   SpO2 94%   BMI 35.85 kg/m  General:   Alert,  pleasant and cooperative in NAD Head:  Normocephalic and atraumatic. Neck:  Supple; Lungs:  Clear throughout to auscultation.    Heart:  Regular rate and rhythm. Abdomen:  Soft, nontender and nondistended. Normal bowel sounds, without guarding, and without rebound.   Neurologic:  Alert and  oriented x4;  grossly normal neurologically.  Impression/Plan:    SCREENING  Plan:  1. TCS TODAY DISCUSSED PROCEDURE, BENEFITS, & RISKS: < 1% chance of medication reaction, bleeding, perforation, or rupture of spleen/liver.

## 2018-08-21 ENCOUNTER — Encounter (HOSPITAL_COMMUNITY): Payer: Self-pay | Admitting: Gastroenterology

## 2018-08-28 ENCOUNTER — Telehealth: Payer: Self-pay | Admitting: Gastroenterology

## 2018-08-28 NOTE — Telephone Encounter (Signed)
Forwarding to Dr. Fields for results.  

## 2018-08-28 NOTE — Telephone Encounter (Signed)
Pt was calling to see if her colonoscopy results were back. [phone]

## 2018-08-28 NOTE — Telephone Encounter (Signed)
Pt is aware.  

## 2018-08-28 NOTE — Telephone Encounter (Addendum)
Please call pt. She had ONE SERRATED adenoma AND THREE SIMPLE ADENOMAS removed.   DRINK WATER TO KEEP YOUR URINE LIGHT YELLOW.  CONTINUE YOUR WEIGHT LOSS EFFORTS.   FOLLOW A HIGH FIBER DIET. AVOID ITEMS THAT CAUSE BLOATING.   USE PREPARATION H FOUR TIMES  A DAY IF NEEDED TO RELIEVE RECTAL PAIN/PRESSURE/BLEEDING.  Next colonoscopy in 3 years.

## 2018-08-30 NOTE — Telephone Encounter (Signed)
Reminder in epic °

## 2018-09-24 ENCOUNTER — Encounter: Payer: Self-pay | Admitting: Family Medicine

## 2018-09-26 ENCOUNTER — Other Ambulatory Visit: Payer: Self-pay | Admitting: Family Medicine

## 2018-09-26 MED FILL — HYDROCHLOROTHIAZIDE 25 MG T: 25 | 90 days supply | Qty: 90 | Fill #0

## 2018-09-26 MED FILL — IBUPROFEN 800 MG TAB: 800 | 21 days supply | Qty: 21 | Fill #0

## 2018-10-28 MED FILL — AMLODIPINE 2.5 MG TABLET: 2.5 | 90 days supply | Qty: 90 | Fill #1 | Status: TO

## 2018-10-28 MED FILL — POTASSIUM CHLORIDE CRYS ER: 20 | 90 days supply | Qty: 90 | Fill #1 | Status: TO

## 2018-11-04 DIAGNOSIS — I10 Essential (primary) hypertension: Secondary | ICD-10-CM | POA: Diagnosis not present

## 2018-11-04 DIAGNOSIS — E7849 Other hyperlipidemia: Secondary | ICD-10-CM | POA: Diagnosis not present

## 2018-11-04 DIAGNOSIS — R7303 Prediabetes: Secondary | ICD-10-CM | POA: Diagnosis not present

## 2018-11-05 LAB — COMPLETE METABOLIC PANEL WITH GFR
AG RATIO: 1.5 (calc) (ref 1.0–2.5)
ALBUMIN MSPROF: 4.3 g/dL (ref 3.6–5.1)
ALT: 12 U/L (ref 6–29)
AST: 13 U/L (ref 10–35)
Alkaline phosphatase (APISO): 61 U/L (ref 37–153)
BILIRUBIN TOTAL: 0.4 mg/dL (ref 0.2–1.2)
BUN: 14 mg/dL (ref 7–25)
CHLORIDE: 105 mmol/L (ref 98–110)
CO2: 28 mmol/L (ref 20–32)
Calcium: 10.1 mg/dL (ref 8.6–10.4)
Creat: 0.62 mg/dL (ref 0.50–1.05)
GFR, EST AFRICAN AMERICAN: 116 mL/min/{1.73_m2} (ref >=60)
GFR, Est Non African American: 100 mL/min/{1.73_m2} (ref >=60)
GLOBULIN: 2.8 g/dL (ref 1.9–3.7)
Glucose, Bld: 89 mg/dL (ref 65–99)
POTASSIUM: 4.3 mmol/L (ref 3.5–5.3)
Sodium: 142 mmol/L (ref 135–146)
TOTAL PROTEIN: 7.1 g/dL (ref 6.1–8.1)

## 2018-11-05 LAB — LIPID PANEL
CHOLESTEROL: 193 mg/dL (ref <200)
HDL: 54 mg/dL (ref >=50)
LDL Cholesterol (Calc): 122 mg/dL (calc) — ABNORMAL HIGH
NON-HDL CHOLESTEROL (CALC): 139 mg/dL — AB (ref <130)
TRIGLYCERIDES: 78 mg/dL (ref <150)
Total CHOL/HDL Ratio: 3.6 (calc) (ref <5.0)

## 2018-11-05 LAB — HEMOGLOBIN A1C
Hgb A1c MFr Bld: 6.1 % of total Hgb — ABNORMAL HIGH (ref <5.7)
Mean Plasma Glucose: 128 (calc)
eAG (mmol/L): 7.1 (calc)

## 2018-11-11 ENCOUNTER — Ambulatory Visit (INDEPENDENT_AMBULATORY_CARE_PROVIDER_SITE_OTHER): Payer: 59 | Admitting: Family Medicine

## 2018-11-11 ENCOUNTER — Encounter: Payer: Self-pay | Admitting: Family Medicine

## 2018-11-11 VITALS — BP 118/84 | HR 82 | Resp 15 | Ht 62.0 in | Wt 192.0 lb

## 2018-11-11 DIAGNOSIS — Z1159 Encounter for screening for other viral diseases: Secondary | ICD-10-CM | POA: Diagnosis not present

## 2018-11-11 DIAGNOSIS — F17218 Nicotine dependence, cigarettes, with other nicotine-induced disorders: Secondary | ICD-10-CM

## 2018-11-11 DIAGNOSIS — J302 Other seasonal allergic rhinitis: Secondary | ICD-10-CM

## 2018-11-11 DIAGNOSIS — R7303 Prediabetes: Secondary | ICD-10-CM | POA: Diagnosis not present

## 2018-11-11 DIAGNOSIS — I10 Essential (primary) hypertension: Secondary | ICD-10-CM

## 2018-11-11 DIAGNOSIS — E7849 Other hyperlipidemia: Secondary | ICD-10-CM | POA: Diagnosis not present

## 2018-11-11 DIAGNOSIS — D473 Essential (hemorrhagic) thrombocythemia: Secondary | ICD-10-CM | POA: Diagnosis not present

## 2018-11-11 DIAGNOSIS — D75839 Thrombocytosis, unspecified: Secondary | ICD-10-CM

## 2018-11-11 MED ORDER — FLUTICASONE PROPIONATE 50 MCG/ACT NA SUSP: 2.0000 | Freq: Every day | NASAL | 6 refills | Status: DC

## 2018-11-11 MED FILL — FLUTICASONE PROP 50 MCG SPR: 50 | 30 days supply | Qty: 16 | Fill #0 | Status: TO

## 2018-11-11 NOTE — Assessment & Plan Note (Signed)
Hyperlipidemia:Low fat diet discussed and encouraged.   Lipid Panel  Lab Results  Component Value Date   CHOL 193 11/04/2018   HDL 54 11/04/2018   LDLCALC 122 (H) 11/04/2018   TRIG 78 11/04/2018   CHOLHDL 3.6 11/04/2018     Pt to continue low fat diet

## 2018-11-11 NOTE — Assessment & Plan Note (Addendum)
Will address at next visit if still smoking

## 2018-11-11 NOTE — Assessment & Plan Note (Signed)
Obesity linked with hypertension, hyperlipidemia and breast cancer 4 pound weight loss since last visit, so slight improvement   Patient re-educated about  the importance of commitment to a  minimum of 150 minutes of exercise per week as able.  The importance of healthy food choices with portion control discussed, as well as eating regularly and within a 12 hour window most days. The need to choose "clean , green" food 50 to 75% of the time is discussed, as well as to make water the primary drink and set a goal of 64 ounces water daily.  Encouraged to start a food diary,  and to consider  joining a support group. Sample diet sheets offered. Goals set by the patient for the next several months.   Weight /BMI 11/11/2018 08/16/2018 07/08/2018  WEIGHT 192 lb 196 lb 195 lb 1.9 oz  HEIGHT 5\' 2"  5\' 2"  5\' 2"   BMI 35.12 kg/m2 35.85 kg/m2 35.69 kg/m2

## 2018-11-11 NOTE — Assessment & Plan Note (Signed)
Controlled, no change in medication DASH diet and commitment to daily physical activity for a minimum of 30 minutes discussed and encouraged, as a part of hypertension management. The importance of attaining a healthy weight is also discussed.  BP/Weight 11/11/2018 08/16/2018 07/08/2018 05/20/2018 05/09/2018 01/25/2018 7/89/3810  Systolic BP 175 102 585 277 824 235 361  Diastolic BP 84 62 82 83 84 84 84  Wt. (Lbs) 192 196 195.12 191.3 195 192 191  BMI 35.12 35.85 35.69 34.99 35.67 35.12 34.93

## 2018-11-11 NOTE — Patient Instructions (Addendum)
F/U in 4.5 months, call if you need me before  Congrats on improved cholesterol, now need to reduce sugar andf starchy foods, no sweet drinks  Take it ONE step at a time  HBA1C, chem 7 and EGFr non fasting 5 days before next visit  Think about what you will eat, plan ahead. Choose " clean, green, fresh or frozen" over canned, processed or packaged foods which are more sugary, salty and fatty. 70 to 75% of food eaten should be vegetables and fruit. Three meals at set times with snacks allowed between meals, but they must be fruit or vegetables. Aim to eat over a 12 hour period , example 7 am to 7 pm, and STOP after  your last meal of the day. Drink water,generally about 64 ounces per day, no other drink is as healthy. Fruit juice is best enjoyed in a healthy way, by EATING the fruit.   Thank you  for choosing [facility]. We consider it a privelige to serve you.  Delivering excellent health care in a caring and  compassionate way is our goal.  Partnering with you,  so that together we can achieve this goal is our strategy.

## 2018-11-11 NOTE — Assessment & Plan Note (Signed)
Currently approaching peak season of allergies, will start using medication daily

## 2018-11-11 NOTE — Progress Notes (Signed)
[name] [name]     MRN: [id number]      DOB: April 14, 1961   HPI [name] [name] is here for follow up and re-evaluation of chronic medical conditions, medication management and review of any available recent lab and radiology data.  Preventive health is updated, specifically  Cancer screening and Immunization.   Questions or concerns regarding consultations or procedures which the PT has had in the interim are  addressed. The PT denies any adverse reactions to current medications since the last visit.  Currently recovering from acute diarrhea , which started this past Friday, other work colleagues have been similarly ill No regular exercise as she wants to, also working on dietary modification Increased clear nasal congestion and drainage with season change  ROS Denies recent fever or chills. Denies sinus pressure, nasal congestion, ear pain or sore throat. Denies chest congestion, productive cough or wheezing. Denies chest pains, palpitations and leg swelling Denies abdominal pain, nausea, vomiting,diarrhea or constipation.   Denies dysuria, frequency, hesitancy or incontinence. Denies joint pain, swelling and limitation in mobility. Denies headaches, seizures, numbness, or tingling. Denies depression, anxiety or insomnia. Denies skin break down or rash.   PE  BP 118/84   Pulse 82   Resp 15   Ht 5\' 2"  (1.575 m)   Wt 192 lb (87.1 kg)   SpO2 98%   BMI 35.12 kg/m   Patient alert and oriented and in no cardiopulmonary distress.  HEENT: No facial asymmetry, EOMI,   oropharynx pink and moist.  Neck supple no JVD, no mass.  Chest: Clear to auscultation bilaterally.  CVS: S1, S2 no murmurs, no S3.Regular rate.  ABD: Soft non tender.   Ext: No edema  MS: Adequate ROM spine, shoulders, hips and knees.  Skin: Intact, no ulcerations or rash noted.  Psych: Good eye contact, normal affect. Memory intact not anxious or depressed appearing.  CNS: CN 2-12 intact, power,  normal  throughout.no focal deficits noted.   Assessment & Plan  Essential hypertension Controlled, no change in medication DASH diet and commitment to daily physical activity for a minimum of 30 minutes discussed and encouraged, as a part of hypertension management. The importance of attaining a healthy weight is also discussed.  BP/Weight 11/11/2018 08/16/2018 07/08/2018 05/20/2018 05/09/2018 01/25/2018 3/87/5643  Systolic BP 329 518 841 660 630 160 109  Diastolic BP 84 62 82 83 84 84 84  Wt. (Lbs) 192 196 195.12 191.3 195 192 191  BMI 35.12 35.85 35.69 34.99 35.67 35.12 34.93       Morbid obesity (HCC) Obesity linked with hypertension, hyperlipidemia and breast cancer 4 pound weight loss since last visit, so slight improvement   Patient re-educated about  the importance of commitment to a  minimum of 150 minutes of exercise per week as able.  The importance of healthy food choices with portion control discussed, as well as eating regularly and within a 12 hour window most days. The need to choose "clean , green" food 50 to 75% of the time is discussed, as well as to make water the primary drink and set a goal of 64 ounces water daily.  Encouraged to start a food diary,  and to consider  joining a support group. Sample diet sheets offered. Goals set by the patient for the next several months.   Weight /BMI 11/11/2018 08/16/2018 07/08/2018  WEIGHT 192 lb 196 lb 195 lb 1.9 oz  HEIGHT 5\' 2"  5\' 2"  5\' 2"   BMI 35.12 kg/m2 35.85 kg/m2 35.69  kg/m2      Hyperlipemia Hyperlipidemia:Low fat diet discussed and encouraged.   Lipid Panel  Lab Results  Component Value Date   CHOL 193 11/04/2018   HDL 54 11/04/2018   LDLCALC 122 (H) 11/04/2018   TRIG 78 11/04/2018   CHOLHDL 3.6 11/04/2018     Pt to continue low fat diet  Allergic rhinitis Currently approaching peak season of allergies, will start using medication daily  Nicotine dependence Will address at next visit if still  smoking

## 2018-11-15 ENCOUNTER — Other Ambulatory Visit (HOSPITAL_COMMUNITY): Payer: 59

## 2018-11-18 ENCOUNTER — Telehealth: Payer: Self-pay | Admitting: *Deleted

## 2018-11-18 ENCOUNTER — Ambulatory Visit (HOSPITAL_COMMUNITY): Payer: 59 | Admitting: Hematology

## 2018-11-18 NOTE — Telephone Encounter (Signed)
Spoke with patient, complained of body aches, chills, headache, nasal congestion, and a cough. Spoke with Dr. Moshe Cipro, her recommendation and patient agreed to go to the Urgent Care to be seen

## 2018-11-18 NOTE — Telephone Encounter (Signed)
Pt called stated that she was saw last week in the office. She had diarrhea. This week she feels achy and her head is stopped up. She stated that it started last night. Wanted to know if she needs to be seen or if something could be called in.

## 2018-11-19 ENCOUNTER — Ambulatory Visit (HOSPITAL_COMMUNITY): Payer: 59 | Admitting: Hematology

## 2018-11-21 ENCOUNTER — Ambulatory Visit (HOSPITAL_COMMUNITY): Payer: 59 | Admitting: Hematology

## 2018-11-25 ENCOUNTER — Ambulatory Visit (HOSPITAL_COMMUNITY): Payer: 59 | Admitting: Hematology

## 2018-11-27 ENCOUNTER — Ambulatory Visit (HOSPITAL_COMMUNITY): Payer: 59 | Admitting: Hematology

## 2018-11-27 NOTE — Progress Notes (Deleted)
[name] [name], [place] [id number]   CLINIC:  Medical Oncology/Hematology  PCP:  [name] Helper, MD [address], [place] [address] [phone]   REASON FOR VISIT: Follow-up for thrombocytosis  CURRENT THERAPY: observation   INTERVAL HISTORY:  [name] [name] 58 y.o. female returns for routine follow-up for thrombocytosis.    REVIEW OF SYSTEMS:  Review of Systems - Oncology   PAST MEDICAL/SURGICAL HISTORY:  Past Medical History:  Diagnosis Date  . Adenocarcinoma of breast ([place])    left   . Heel spur   . Hyperlipidemia   . Hypertension   . Migraines   . Obesity    Past Surgical History:  Procedure Laterality Date  . BREAST SURGERY    . btl  1992  . CHOLECYSTECTOMY    . COLONOSCOPY  09/15/2011   Procedure: COLONOSCOPY;  Surgeon: [name] Peng, MD;  [facility];  Service: Endoscopy;  Laterality: N/A;  9:15 AM  . COLONOSCOPY N/A 08/16/2018   Procedure: COLONOSCOPY;  Surgeon: [name] Binder, MD;  [facility];  Service: Endoscopy;  Laterality: N/A;  3:00  . left lumpectomy and lymph node disection  2000  . POLYPECTOMY  08/16/2018   Procedure: POLYPECTOMY;  Surgeon: [name] Binder, MD;  [facility];  Service: Endoscopy;;  ascending colon(CSx3), descending colon(CSx1)  . r heel surgery for spur and chipped bone  11/09/20099  . TUBAL LIGATION       SOCIAL HISTORY:  Social History   Socioeconomic History  . Marital status: Married    Spouse name: Not on file  . Number of children: Not on file  . Years of education: Not on file  . Highest education level: Not on file  Occupational History  . Not on file  Social Needs  . Financial resource strain: Not on file  . Food insecurity:    Worry: Not on file    Inability: Not on file  . Transportation needs:    Medical: Not on file    Non-medical: Not on file  Tobacco Use  . Smoking status: Current Every Day Smoker   Packs/day: 0.25    Years: 15.00    Pack years: 3.75    Types: Cigarettes  . Smokeless tobacco: Never Used  . Tobacco comment: smoking 3 per day  Substance and Sexual Activity  . Alcohol use: No    Alcohol/week: 0.0 standard drinks  . Drug use: No    Comment: pt states "3 cigarrettes per day"  . Sexual activity: Yes    Birth control/protection: Post-menopausal  Lifestyle  . Physical activity:    Days per week: Not on file    Minutes per session: Not on file  . Stress: Not on file  Relationships  . Social connections:    Talks on phone: Not on file    Gets together: Not on file    Attends religious service: Not on file    Active member of club or organization: Not on file    Attends meetings of clubs or organizations: Not on file    Relationship status: Not on file  . Intimate partner violence:    Fear of current or ex partner: Not on file    Emotionally abused: Not on file    Physically abused: Not on file    Forced sexual activity: Not on file  Other Topics Concern  . Not on file  Social History Narrative  .  Not on file    FAMILY HISTORY:  Family History  Problem Relation Age of Onset  . Heart attack Mother   . Heart attack Father   . Glaucoma Father   . Hypertension Father   . Diabetes Sister   . Anesthesia problems Neg Hx   . Hypotension Neg Hx   . Malignant hyperthermia Neg Hx   . Pseudochol deficiency Neg Hx   . Migraines Neg Hx     CURRENT MEDICATIONS:  Outpatient Encounter Medications as of 11/27/2018  Medication Sig  . amLODipine (NORVASC) 2.5 MG tablet TAKE 1 TABLET BY MOUTH ONCE DAILY (Patient taking differently: Take 2.5 mg by mouth daily at 6 (six) AM. )  . Biotin 1000 MCG tablet Take 1,000 mcg by mouth daily at 6 (six) AM.   . fluticasone (FLONASE) 50 MCG/ACT nasal spray Place 2 sprays into both nostrils daily.  . hydrochlorothiazide (HYDRODIURIL) 25 MG tablet TAKE 1 TABLET BY MOUTH DAILY  . ibuprofen (ADVIL,MOTRIN) 800 MG tablet TAKE 1 TABLET BY  MOUTH DAILY AS NEEDED (MAX OF 3 TABLETS PER WEEK)  . potassium chloride SA (K-DUR,KLOR-CON) 20 MEQ tablet TAKE 1 TABLET (20 MEQ TOTAL) BY MOUTH DAILY. (Patient taking differently: Take 20 mEq by mouth daily at 6 (six) AM. )   No facility-administered encounter medications on file as of 11/27/2018.     ALLERGIES:  Allergies  Allergen Reactions  . Ace Inhibitors Cough     PHYSICAL EXAM:  ECOG Performance status: 1  There were no vitals filed for this visit. There were no vitals filed for this visit.  Physical Exam   LABORATORY DATA:  I have reviewed the labs as listed.  CBC    Component Value Date/Time   WBC 8.1 05/17/2018 1108   RBC 4.44 05/17/2018 1108   HGB 13.7 05/17/2018 1108   HGB 13.7 04/09/2017 1445   HCT 41.4 05/17/2018 1108   HCT 39.9 04/09/2017 1445   PLT 419 (H) 05/17/2018 1108   PLT 478 (H) 04/09/2017 1445   MCV 93.2 05/17/2018 1108   MCV 88 04/09/2017 1445   MCH 30.9 05/17/2018 1108   MCHC 33.1 05/17/2018 1108   RDW 14.1 05/17/2018 1108   RDW 14.9 04/09/2017 1445   LYMPHSABS 3.2 05/17/2018 1108   LYMPHSABS 4.5 (H) 04/09/2017 1445   MONOABS 0.5 05/17/2018 1108   EOSABS 0.3 05/17/2018 1108   EOSABS 0.2 04/09/2017 1445   BASOSABS 0.0 05/17/2018 1108   BASOSABS 0.1 04/09/2017 1445   CMP Latest Ref Rng & Units 11/04/2018 07/01/2018 05/17/2018  Glucose 65 - 99 mg/dL 89 84 85  BUN 7 - 25 mg/dL 14 12 11   Creatinine 0.50 - 1.05 mg/dL 0.62 0.53 0.65  Sodium 135 - 146 mmol/L 142 140 142  Potassium 3.5 - 5.3 mmol/L 4.3 4.2 3.7  Chloride 98 - 110 mmol/L 105 103 105  CO2 20 - 32 mmol/L 28 29 29   Calcium 8.6 - 10.4 mg/dL 10.1 10.0 9.7  Total Protein 6.1 - 8.1 g/dL 7.1 7.2 7.3  Total Bilirubin 0.2 - 1.2 mg/dL 0.4 0.5 0.5  Alkaline Phos 38 - 126 U/L - - 61  AST 10 - 35 U/L 13 13 15   ALT 6 - 29 U/L 12 12 15        DIAGNOSTIC IMAGING:  I have independently reviewed the scans and discussed with the patient.   I have reviewed [name] Finders, NP's note and agree  with the documentation.  I personally performed a face-to-face visit, made revisions  and my assessment and plan is as follows.    ASSESSMENT & PLAN:   No problem-specific Assessment & Plan notes found for this encounter.      Orders placed this encounter:  No orders of the defined types were placed in this encounter.     Derek Jack, MD [place] [phone]

## 2018-12-30 ENCOUNTER — Other Ambulatory Visit: Payer: Self-pay | Admitting: Family Medicine

## 2018-12-30 DIAGNOSIS — I10 Essential (primary) hypertension: Secondary | ICD-10-CM

## 2018-12-30 MED FILL — HYDROCHLOROTHIAZIDE 25 MG T: 25 | 90 days supply | Qty: 90 | Fill #0

## 2018-12-30 MED FILL — FLUTICASONE PROP 50 MCG SPR: 50 | 30 days supply | Qty: 16 | Fill #0

## 2019-01-19 ENCOUNTER — Encounter: Payer: Self-pay | Admitting: Orthopedic Surgery

## 2019-01-25 MED FILL — AMLODIPINE 2.5 MG TABLET: 2.5 | 90 days supply | Qty: 90 | Fill #0

## 2019-01-25 MED FILL — POTASSIUM CHLORIDE CRYS ER: 20 | 90 days supply | Qty: 90 | Fill #0

## 2019-02-27 IMAGING — DX DG KNEE COMPLETE 4+V*L*
4 series · 4 of 4 positions shown · non-contrast
Comparison: None.

CLINICAL DATA: 56-year-old female with left knee pain and swelling
for the past month. No known injury. Initial encounter.

EXAM:
LEFT KNEE - COMPLETE 4+ VIEW

[knee ap]
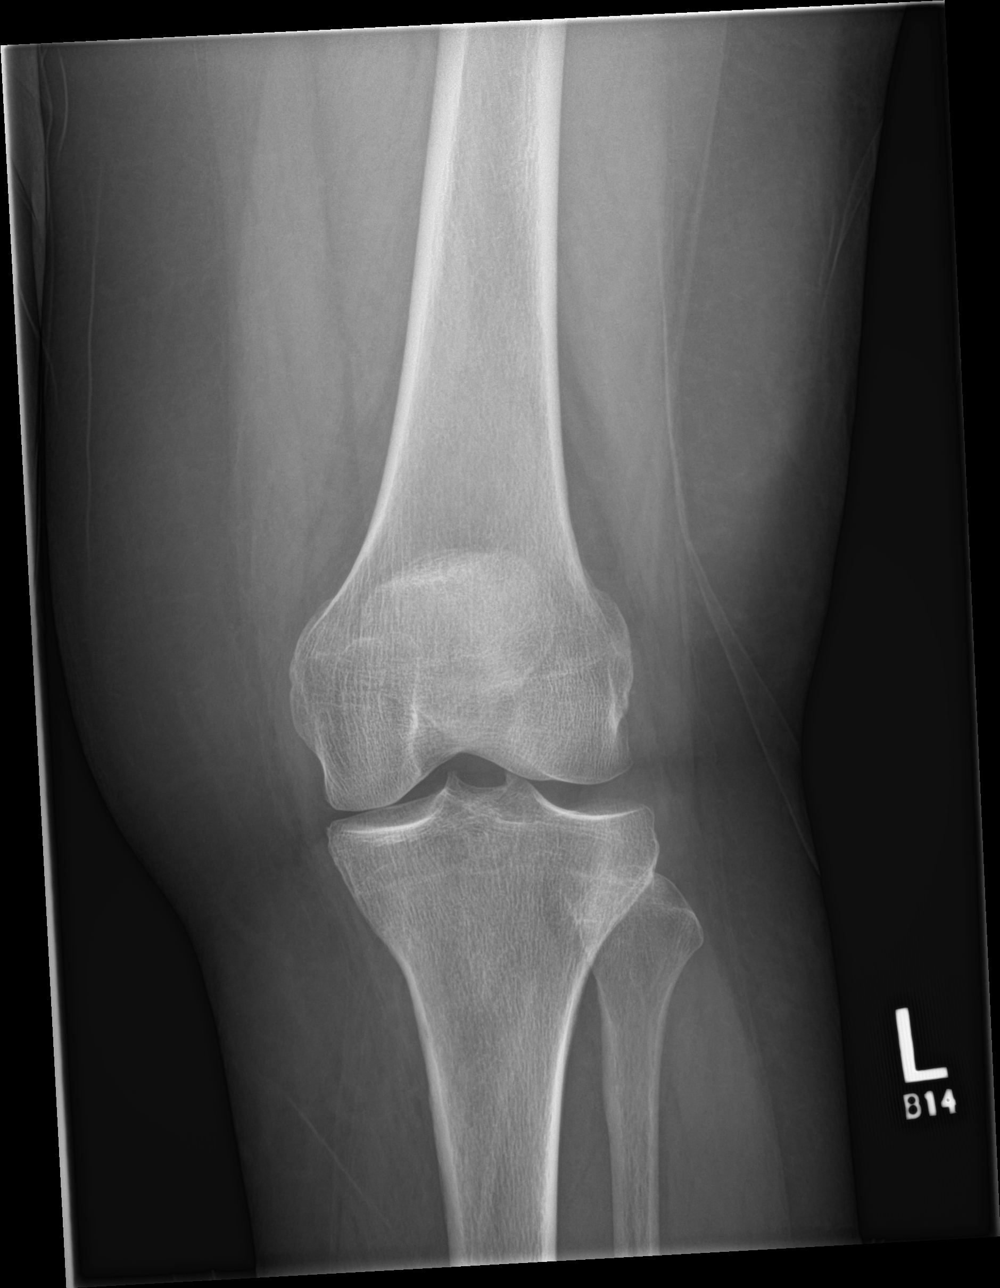

[knee obl (1 of 2)]
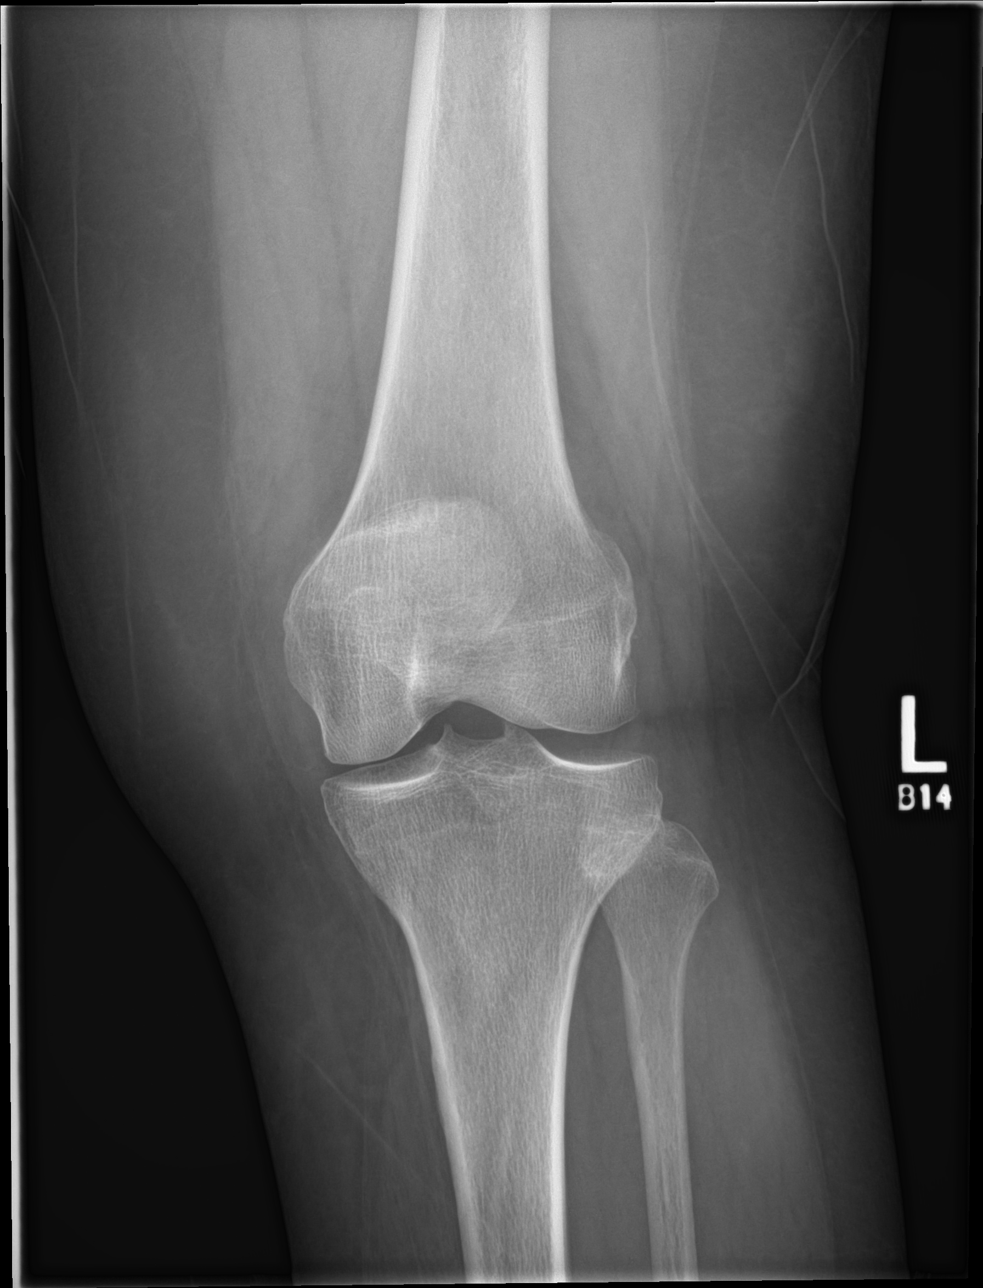

[knee obl (2 of 2)]
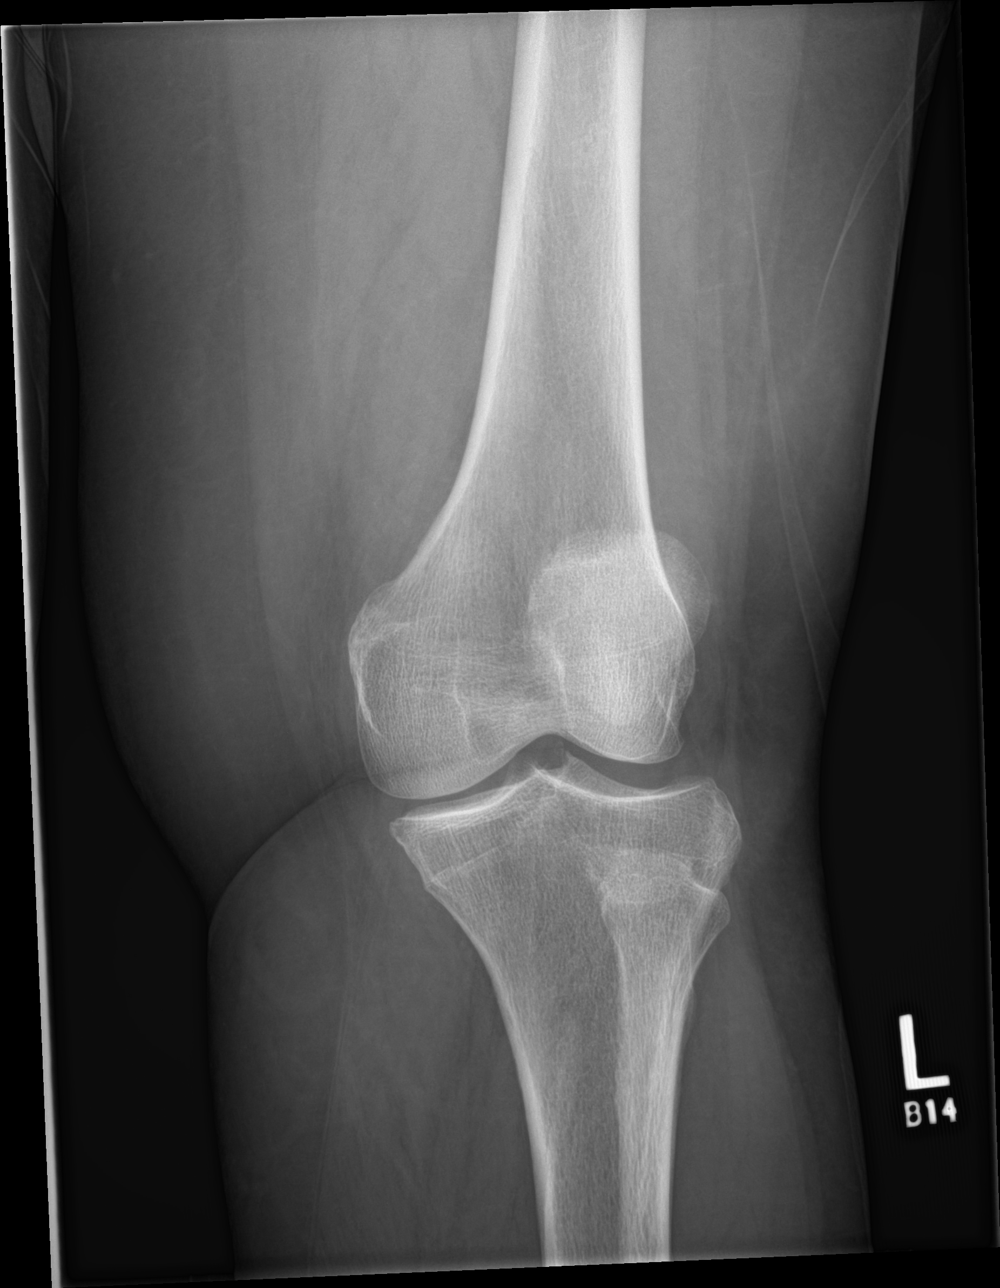

[knee lat]
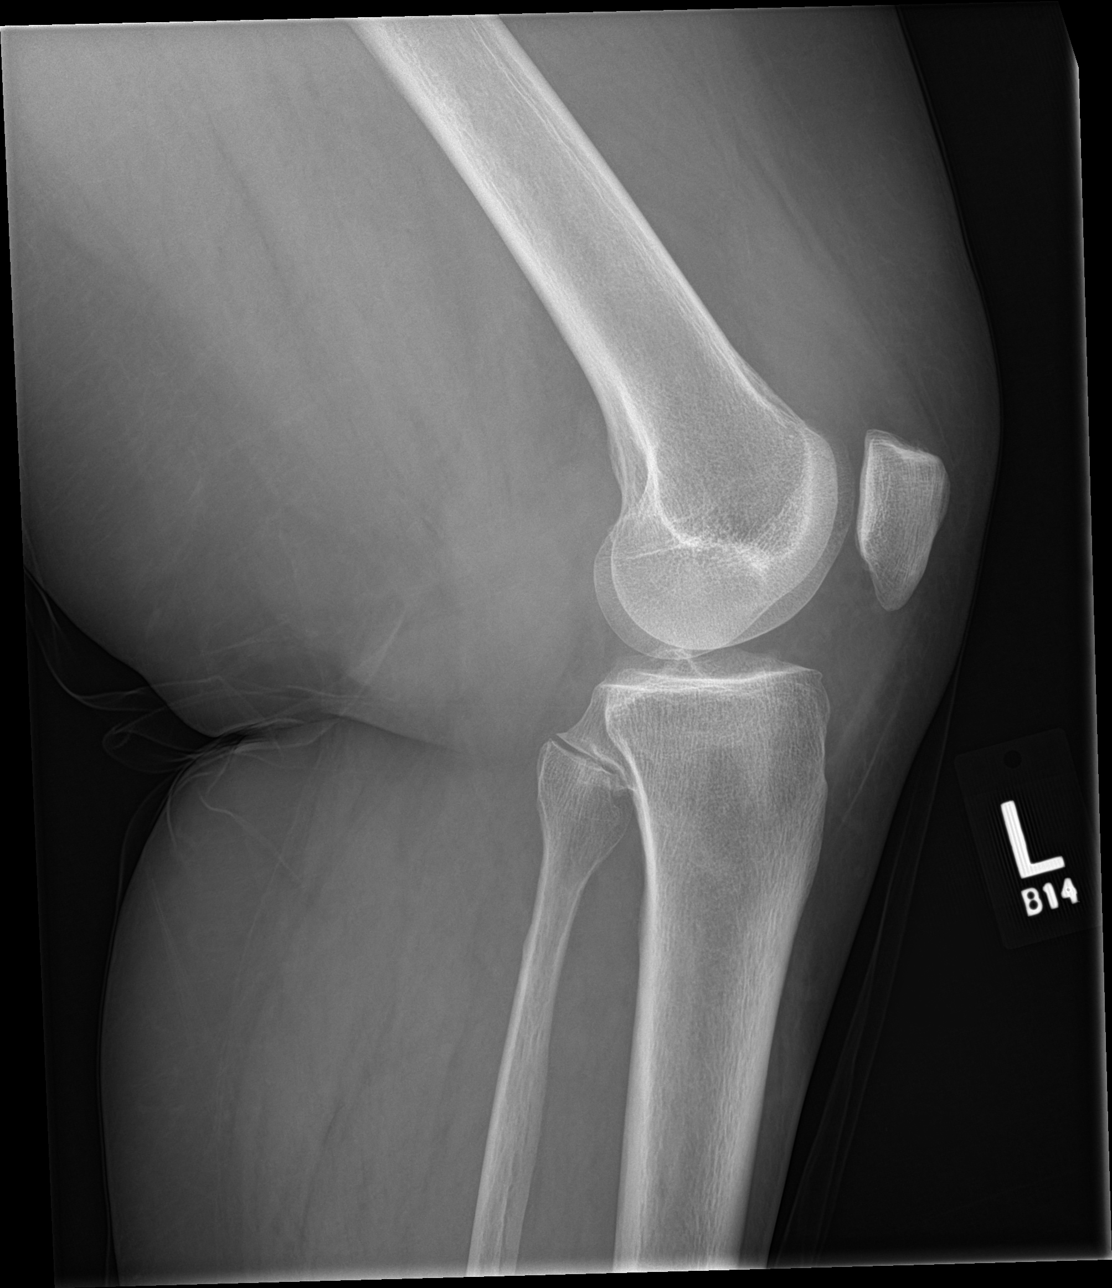

[4 of 4 positions shown; findings below may reference images not displayed]

FINDINGS: No significant joint space narrowing.

Suprapatellar joint effusion.

No fracture or dislocation.
IMPRESSION: No significant joint space narrowing.

Suprapatellar joint effusion.

## 2019-03-31 ENCOUNTER — Other Ambulatory Visit: Payer: Self-pay | Admitting: Family Medicine

## 2019-03-31 ENCOUNTER — Telehealth: Payer: Self-pay

## 2019-03-31 DIAGNOSIS — I10 Essential (primary) hypertension: Secondary | ICD-10-CM

## 2019-03-31 MED ORDER — HYDROCHLOROTHIAZIDE 25 MG PO TABS: 25.0000 mg | ORAL_TABLET | Freq: Every day | ORAL | 1 refills | Status: DC

## 2019-03-31 MED FILL — HYDROCHLOROTHIAZIDE 25 MG T: 25 | 90 days supply | Qty: 90 | Fill #0

## 2019-03-31 NOTE — Telephone Encounter (Signed)
[name] [name], CMA  

## 2019-04-04 ENCOUNTER — Other Ambulatory Visit: Payer: Self-pay | Admitting: Family Medicine

## 2019-04-07 DIAGNOSIS — Z1159 Encounter for screening for other viral diseases: Secondary | ICD-10-CM | POA: Diagnosis not present

## 2019-04-07 DIAGNOSIS — R7303 Prediabetes: Secondary | ICD-10-CM | POA: Diagnosis not present

## 2019-04-07 DIAGNOSIS — I10 Essential (primary) hypertension: Secondary | ICD-10-CM | POA: Diagnosis not present

## 2019-04-08 LAB — BASIC METABOLIC PANEL WITH GFR
BUN: 9 mg/dL (ref 7–25)
CO2: 31 mmol/L (ref 20–32)
Calcium: 10.1 mg/dL (ref 8.6–10.4)
Chloride: 104 mmol/L (ref 98–110)
Creat: 0.63 mg/dL (ref 0.50–1.05)
GFR, EST AFRICAN AMERICAN: 115 mL/min/{1.73_m2} (ref >=60)
GFR, EST NON AFRICAN AMERICAN: 100 mL/min/{1.73_m2} (ref >=60)
GLUCOSE: 87 mg/dL (ref 65–99)
Potassium: 4.1 mmol/L (ref 3.5–5.3)
Sodium: 140 mmol/L (ref 135–146)

## 2019-04-08 LAB — HEMOGLOBIN A1C
Hgb A1c MFr Bld: 5.9 % of total Hgb — ABNORMAL HIGH (ref <5.7)
Mean Plasma Glucose: 123 (calc)
eAG (mmol/L): 6.8 (calc)

## 2019-04-08 LAB — HEPATITIS C ANTIBODY
HEP C AB: NONREACTIVE
SIGNAL TO CUT-OFF: 0.45 (ref <1.00)

## 2019-04-08 MED FILL — IBUPROFEN 800 MG TABS: 800 | 21 days supply | Qty: 21 | Fill #0

## 2019-04-14 ENCOUNTER — Inpatient Hospital Stay (HOSPITAL_COMMUNITY): Payer: 59 | Attending: Nurse Practitioner | Admitting: Nurse Practitioner

## 2019-04-14 ENCOUNTER — Encounter (INDEPENDENT_AMBULATORY_CARE_PROVIDER_SITE_OTHER): Payer: Self-pay

## 2019-04-14 ENCOUNTER — Encounter (HOSPITAL_COMMUNITY): Payer: Self-pay | Admitting: Nurse Practitioner

## 2019-04-14 ENCOUNTER — Other Ambulatory Visit: Payer: Self-pay

## 2019-04-14 ENCOUNTER — Inpatient Hospital Stay (HOSPITAL_COMMUNITY): Payer: 59

## 2019-04-14 ENCOUNTER — Ambulatory Visit: Payer: 59 | Admitting: Family Medicine

## 2019-04-14 ENCOUNTER — Encounter: Payer: Self-pay | Admitting: Family Medicine

## 2019-04-14 VITALS — BP 130/94 | HR 87 | Resp 12 | Ht 62.0 in | Wt 199.0 lb

## 2019-04-14 DIAGNOSIS — D75839 Thrombocytosis, unspecified: Secondary | ICD-10-CM

## 2019-04-14 DIAGNOSIS — Z79899 Other long term (current) drug therapy: Secondary | ICD-10-CM

## 2019-04-14 DIAGNOSIS — E7849 Other hyperlipidemia: Secondary | ICD-10-CM

## 2019-04-14 DIAGNOSIS — M19042 Primary osteoarthritis, left hand: Secondary | ICD-10-CM

## 2019-04-14 DIAGNOSIS — Z923 Personal history of irradiation: Secondary | ICD-10-CM | POA: Diagnosis not present

## 2019-04-14 DIAGNOSIS — D473 Essential (hemorrhagic) thrombocythemia: Secondary | ICD-10-CM | POA: Diagnosis not present

## 2019-04-14 DIAGNOSIS — R7303 Prediabetes: Secondary | ICD-10-CM

## 2019-04-14 DIAGNOSIS — Z9221 Personal history of antineoplastic chemotherapy: Secondary | ICD-10-CM

## 2019-04-14 DIAGNOSIS — E876 Hypokalemia: Secondary | ICD-10-CM | POA: Diagnosis not present

## 2019-04-14 DIAGNOSIS — I10 Essential (primary) hypertension: Secondary | ICD-10-CM

## 2019-04-14 DIAGNOSIS — Z1231 Encounter for screening mammogram for malignant neoplasm of breast: Secondary | ICD-10-CM

## 2019-04-14 DIAGNOSIS — F17218 Nicotine dependence, cigarettes, with other nicotine-induced disorders: Secondary | ICD-10-CM

## 2019-04-14 DIAGNOSIS — E559 Vitamin D deficiency, unspecified: Secondary | ICD-10-CM | POA: Diagnosis not present

## 2019-04-14 DIAGNOSIS — Z853 Personal history of malignant neoplasm of breast: Secondary | ICD-10-CM

## 2019-04-14 LAB — CBC WITH DIFFERENTIAL/PLATELET
Abs Immature Granulocytes: 0.02 10*3/uL (ref 0.00–0.07)
Basophils Absolute: 0.1 10*3/uL (ref 0.0–0.1)
Basophils Relative: 1 %
Eosinophils Absolute: 0.2 10*3/uL (ref 0.0–0.5)
Eosinophils Relative: 2 %
HCT: 45.1 % (ref 36.0–46.0)
Hemoglobin: 14.6 g/dL (ref 12.0–15.0)
Immature Granulocytes: 0 %
Lymphocytes Relative: 40 %
Lymphs Abs: 3.5 10*3/uL (ref 0.7–4.0)
MCH: 30 pg (ref 26.0–34.0)
MCHC: 32.4 g/dL (ref 30.0–36.0)
MCV: 92.8 fL (ref 80.0–100.0)
Monocytes Absolute: 0.6 10*3/uL (ref 0.1–1.0)
Monocytes Relative: 7 %
Neutro Abs: 4.3 10*3/uL (ref 1.7–7.7)
Neutrophils Relative %: 50 %
Platelets: 464 10*3/uL — ABNORMAL HIGH (ref 150–400)
RBC: 4.86 MIL/uL (ref 3.87–5.11)
RDW: 13.7 % (ref 11.5–15.5)
WBC: 8.7 10*3/uL (ref 4.0–10.5)
nRBC: 0 % (ref 0.0–0.2)

## 2019-04-14 NOTE — Progress Notes (Signed)
[name] [name], [place] [id number]   CLINIC:  Medical Oncology/Hematology  PCP:  Fayrene Helper, MD [address], [address] [place]  [address] [phone]   REASON FOR VISIT: Follow-up for thrombocytosis  CURRENT THERAPY: Observation   INTERVAL HISTORY:  [name] [name] 58 y.o. female returns for routine follow-up for thrombocytosis.  Patient reports she has been doing well since her last visit she has no easy bruising.  She denies any bright red bleeding per rectum or melena.  He denies any fatigue or weakness. Denies any nausea, vomiting, or diarrhea. Denies any new pains. Had not noticed any recent bleeding such as epistaxis, hematuria or hematochezia. Denies recent chest pain on exertion, shortness of breath on minimal exertion, pre-syncopal episodes, or palpitations. Denies any numbness or tingling in hands or feet. Denies any recent fevers, infections, or recent hospitalizations. Patient reports appetite at 100% and energy level at 100%.  She is eating well maintaining her weight at this time.     REVIEW OF SYSTEMS:  Review of Systems  All other systems reviewed and are negative.    PAST MEDICAL/SURGICAL HISTORY:  Past Medical History:  Diagnosis Date  . Adenocarcinoma of breast ([place])    left   . Heel spur   . Hyperlipidemia   . Hypertension   . Migraines   . Obesity    Past Surgical History:  Procedure Laterality Date  . BREAST SURGERY    . btl  1992  . CHOLECYSTECTOMY    . COLONOSCOPY  09/15/2011   Procedure: COLONOSCOPY;  Surgeon: Dorothyann Peng, MD;  [facility];  Service: Endoscopy;  Laterality: N/A;  9:15 AM  . COLONOSCOPY N/A 08/16/2018   Procedure: COLONOSCOPY;  Surgeon: Danie Binder, MD;  [facility];  Service: Endoscopy;  Laterality: N/A;  3:00  . left lumpectomy and lymph node disection  2000  . POLYPECTOMY  08/16/2018   Procedure: POLYPECTOMY;  Surgeon: Danie Binder, MD;  [facility];  Service: Endoscopy;;  ascending colon(CSx3), descending colon(CSx1)  . r heel surgery for spur and chipped bone  11/09/20099  . TUBAL LIGATION       SOCIAL HISTORY:  Social History   Socioeconomic History  . Marital status: Married    Spouse name: Not on file  . Number of children: Not on file  . Years of education: Not on file  . Highest education level: Not on file  Occupational History  . Not on file  Social Needs  . Financial resource strain: Not on file  . Food insecurity    Worry: Not on file    Inability: Not on file  . Transportation needs    Medical: Not on file    Non-medical: Not on file  Tobacco Use  . Smoking status: Current Every Day Smoker    Packs/day: 0.25    Years: 15.00    Pack years: 3.75    Types: Cigarettes  . Smokeless tobacco: Never Used  . Tobacco comment: smoking 3 per day  Substance and Sexual Activity  . Alcohol use: No    Alcohol/week: 0.0 standard drinks  . Drug use: No    Comment: pt states "3 cigarrettes per day"  . Sexual activity: Yes    Birth control/protection: Post-menopausal  Lifestyle  . Physical activity    Days per week: Not on file    Minutes per session: Not on file  . Stress: Not on  file  Relationships  . Social Herbalist on phone: Not on file    Gets together: Not on file    Attends religious service: Not on file    Active member of club or organization: Not on file    Attends meetings of clubs or organizations: Not on file    Relationship status: Not on file  . Intimate partner violence    Fear of current or ex partner: Not on file    Emotionally abused: Not on file    Physically abused: Not on file    Forced sexual activity: Not on file  Other Topics Concern  . Not on file  Social History Narrative  . Not on file    FAMILY HISTORY:  Family History  Problem Relation Age of Onset  . Heart attack Mother   . Heart attack Father   . Glaucoma Father   . Hypertension Father   .  Diabetes Sister   . Anesthesia problems Neg Hx   . Hypotension Neg Hx   . Malignant hyperthermia Neg Hx   . Pseudochol deficiency Neg Hx   . Migraines Neg Hx     CURRENT MEDICATIONS:  Outpatient Encounter Medications as of 04/14/2019  Medication Sig  . amLODipine (NORVASC) 2.5 MG tablet TAKE 1 TABLET BY MOUTH ONCE DAILY  . Biotin 1000 MCG tablet Take 1,000 mcg by mouth daily at 6 (six) AM.   . fluticasone (FLONASE) 50 MCG/ACT nasal spray Place 2 sprays into both nostrils daily.  . hydrochlorothiazide (HYDRODIURIL) 25 MG tablet Take 1 tablet (25 mg total) by mouth daily.  Marland Kitchen ibuprofen (ADVIL) 800 MG tablet TAKE 1 TABLET BY MOUTH DAILY AS NEEDED (MAX OF 3 TABLETS PER WEEK)  . potassium chloride SA (K-DUR,KLOR-CON) 20 MEQ tablet TAKE 1 TABLET (20 MEQ TOTAL) BY MOUTH DAILY. (Patient taking differently: Take 20 mEq by mouth daily at 6 (six) AM. )   No facility-administered encounter medications on file as of 04/14/2019.     ALLERGIES:  Allergies  Allergen Reactions  . Ace Inhibitors Cough     PHYSICAL EXAM:  ECOG Performance status: 1  Vitals:   04/14/19 1135  BP: 119/88  Pulse: 96  Resp: 18  Temp: 98.9 F (37.2 C)  SpO2: 99%   Filed Weights   04/14/19 1135  Weight: 198 lb (89.8 kg)    Physical Exam Constitutional:      Appearance: Normal appearance. She is normal weight.  Cardiovascular:     Rate and Rhythm: Normal rate and regular rhythm.     Heart sounds: Normal heart sounds.  Pulmonary:     Effort: Pulmonary effort is normal.     Breath sounds: Normal breath sounds.  Abdominal:     General: Bowel sounds are normal.     Palpations: Abdomen is soft.  Musculoskeletal: Normal range of motion.  Skin:    General: Skin is warm and dry.  Neurological:     Mental Status: She is alert and oriented to person, place, and time. Mental status is at baseline.  Psychiatric:        Mood and Affect: Mood normal.        Behavior: Behavior normal.        Thought Content:  Thought content normal.        Judgment: Judgment normal.      LABORATORY DATA:  I have reviewed the labs as listed.  CBC    Component Value Date/Time   WBC 8.7 04/14/2019  1212   RBC 4.86 04/14/2019 1212   HGB 14.6 04/14/2019 1212   HGB 13.7 04/09/2017 1445   HCT 45.1 04/14/2019 1212   HCT 39.9 04/09/2017 1445   PLT 464 (H) 04/14/2019 1212   PLT 478 (H) 04/09/2017 1445   MCV 92.8 04/14/2019 1212   MCV 88 04/09/2017 1445   MCH 30.0 04/14/2019 1212   MCHC 32.4 04/14/2019 1212   RDW 13.7 04/14/2019 1212   RDW 14.9 04/09/2017 1445   LYMPHSABS 3.5 04/14/2019 1212   LYMPHSABS 4.5 (H) 04/09/2017 1445   MONOABS 0.6 04/14/2019 1212   EOSABS 0.2 04/14/2019 1212   EOSABS 0.2 04/09/2017 1445   BASOSABS 0.1 04/14/2019 1212   BASOSABS 0.1 04/09/2017 1445   CMP Latest Ref Rng & Units 04/07/2019 11/04/2018 07/01/2018  Glucose 65 - 99 mg/dL 87 89 84  BUN 7 - 25 mg/dL 9 14 12   Creatinine 0.50 - 1.05 mg/dL 0.63 0.62 0.53  Sodium 135 - 146 mmol/L 140 142 140  Potassium 3.5 - 5.3 mmol/L 4.1 4.3 4.2  Chloride 98 - 110 mmol/L 104 105 103  CO2 20 - 32 mmol/L 31 28 29   Calcium 8.6 - 10.4 mg/dL 10.1 10.1 10.0  Total Protein 6.1 - 8.1 g/dL - 7.1 7.2  Total Bilirubin 0.2 - 1.2 mg/dL - 0.4 0.5  Alkaline Phos 38 - 126 U/L - - -  AST 10 - 35 U/L - 13 13  ALT 6 - 29 U/L - 12 12   I personally performed a face-to-face visit.  All questions were answered to patient's stated satisfaction. Encouraged patient to call with any new concerns or questions before his next visit to the cancer center and we can certain see him sooner, if needed.     ASSESSMENT & PLAN:   Thrombocytosis ([place]) 1.  Thrombocytosis: - Myeloproliferative work-up negative for Jak 2/exon 12/CALR/MPL. -Denies any vasomotor symptoms like erythromelalgia or aquagenic pruritus. - Labs today on 04/14/2019 was hemoglobin 14.6, WBC 8.7, platelets have increased slightly at 464. - Her platelet count over the last several years is close  to 450.  If there is any significant change in her platelet count we will consider bone marrow aspiration and biopsy. - She will follow-up in 6 months with repeat labs.  2.  Left breast cancer: -Diagnosed in 2006, status post lumpectomy. -Reportedly underwent chemotherapy followed by radiation.  She did not receive antiestrogen therapy. -Mammogram dated 05/13/2018 shows B RADS category 1.  3.  Hypokalemia: -She is on 20 mEq of potassium daily. - Her potassium level on 04/07/2019 was 4.1      Orders placed this encounter:  Orders Placed This Encounter  Procedures  . CBC with Differential  . Lactate dehydrogenase  . CBC with Differential/Platelet  . Comprehensive metabolic panel  . Ferritin  . Iron and TIBC  . Vitamin B12  . Folate      Francene Finders, FNP-C [place] [phone]

## 2019-04-14 NOTE — Assessment & Plan Note (Addendum)
1.  Thrombocytosis: - Myeloproliferative work-up negative for Jak 2/exon 12/CALR/MPL. -Denies any vasomotor symptoms like erythromelalgia or aquagenic pruritus. - Labs today on 04/14/2019 was hemoglobin 14.6, WBC 8.7, platelets have increased slightly at 464. - Her platelet count over the last several years is close to 450.  If there is any significant change in her platelet count we will consider bone marrow aspiration and biopsy. - She will follow-up in 6 months with repeat labs.  2.  Left breast cancer: -Diagnosed in 2006, status post lumpectomy. -Reportedly underwent chemotherapy followed by radiation.  She did not receive antiestrogen therapy. -Mammogram dated 05/13/2018 shows B RADS category 1.  3.  Hypokalemia: -She is on 20 mEq of potassium daily. - Her potassium level on 04/07/2019 was 4.1

## 2019-04-14 NOTE — Assessment & Plan Note (Signed)
Hyperlipidemia:Low fat diet discussed and encouraged.   Lipid Panel  Lab Results  Component Value Date   CHOL 193 11/04/2018   HDL 54 11/04/2018   LDLCALC 122 (H) 11/04/2018   TRIG 78 11/04/2018   CHOLHDL 3.6 11/04/2018     Updated lab needed at/ before next visit.

## 2019-04-14 NOTE — Assessment & Plan Note (Signed)
Asked:confirms currently smokes cigarettes Assess: Unwilling to quit but cutting back Advise: needs to QUIT to reduce risk of cancer, cardio and cerebrovascular disease Assist: counseled for 5 minutes and literature provided Arrange: follow up in 3 months  

## 2019-04-14 NOTE — Patient Instructions (Signed)
[facility] at [facility] Discharge Instructions  Follow up in 6 months with labs    Thank you for choosing [facility] at [facility] to provide your oncology and hematology care.  To afford each patient quality time with our provider, please arrive at least 15 minutes before your scheduled appointment time.   If you have a lab appointment with the Cancer Center please come in thru the  Main Entrance and check in at the main information desk  You need to re-schedule your appointment should you arrive 10 or more minutes late.  We strive to give you quality time with our providers, and arriving late affects you and other patients whose appointments are after yours.  Also, if you no show three or more times for appointments you may be dismissed from the clinic at the providers discretion.     Again, thank you for choosing [facility].  Our hope is that these requests will decrease the amount of time that you wait before being seen by our physicians.       _____________________________________________________________  Should you have questions after your visit to [facility], please contact our office at [phone] between the hours of 8:00 a.m. and 4:30 p.m.  Voicemails left after 4:00 p.m. will not be returned until the following business day.  For prescription refill requests, have your pharmacy contact our office and allow 72 hours.    Cancer Center Support Programs:   > Cancer Support Group  2nd Tuesday of the month 1pm-2pm, Journey Room    

## 2019-04-14 NOTE — Assessment & Plan Note (Signed)
Uncontrolled, no med change, work on  [employer] and commitment to daily physical activity for a minimum of 30 minutes discussed and encouraged, as a part of hypertension management. The importance of attaining a healthy weight is also discussed.  BP/Weight 04/14/2019 12/19/2016 09/26/2016 03/11/2016 08/10/2014 7/54/3606 04/01/339  Systolic BP 352 481 859 093 112 162 446  Diastolic BP 94 85 68 74 84 90 96  Wt. (Lbs) 199.04 - 200 198 200.4 197.04 198  BMI 36.4 - 36.58 36.21 36.64 36.03 36.21  Some encounter information is confidential and restricted. Go to Review Flowsheets activity to see all data.

## 2019-04-14 NOTE — Assessment & Plan Note (Signed)
Obesity linked with hTN, prediabetes, arthritis  Patient re-educated about  the importance of commitment to a  minimum of 150 minutes of exercise per week as able.  The importance of healthy food choices with portion control discussed, as well as eating regularly and within a 12 hour window most days. The need to choose "clean , green" food 50 to 75% of the time is discussed, as well as to make water the primary drink and set a goal of 64 ounces water daily.    Weight /BMI 04/14/2019 09/26/2016 03/11/2016  WEIGHT 199 lb 0.6 oz 200 lb 198 lb  HEIGHT 5\' 2"  5\' 2"  5\' 2"   BMI 36.4 kg/m2 36.58 kg/m2 36.21 kg/m2  Some encounter information is confidential and restricted. Go to Review Flowsheets activity to see all data.

## 2019-04-14 NOTE — Progress Notes (Signed)
[name] [name]     MRN: [id number]      DOB: 29-Dec-1960   HPI [name] is here for follow up and re-evaluation of chronic medical conditions, medication management and review of any available recent lab and radiology data.  Preventive health is updated, specifically  Cancer screening and Immunization.   Questions or concerns regarding consultations or procedures which the PT has had in the interim are  addressed. The PT denies any adverse reactions to current medications since the last visit.  Concerned about weight gain and no exercise, aim is 5 pounds in next 10 weeks Left ring finger intermittently flares with pain and burning may wake  Her in past 6 months,daily  ROS Denies recent fever or chills. Denies sinus pressure, nasal congestion, ear pain or sore throat. Denies chest congestion, productive cough or wheezing. Denies chest pains, palpitations and leg swelling Denies abdominal pain, nausea, vomiting,diarrhea or constipation.   Denies dysuria, frequency, hesitancy or incontinence.  Denies headaches, seizures, numbness, or tingling. Denies depression, anxiety or insomnia. Denies skin break down or rash.   PE  BP (!) 130/94   Pulse 87   Resp 12   Ht 5\' 2"  (1.575 m)   Wt 199 lb 0.6 oz (90.3 kg)   SpO2 95%   BMI 36.40 kg/m   Patient alert and oriented and in no cardiopulmonary distress.  HEENT: No facial asymmetry, EOMI,   oropharynx pink and moist.  Neck supple no JVD, no mass.  Chest: Clear to auscultation bilaterally.  CVS: S1, S2 no murmurs, no S3.Regular rate.  ABD: Soft non tender.   Ext: No edema  MS: Adequate ROM spine, shoulders, hips and knees.  Skin: Intact, no ulcerations or rash noted.  Psych: Good eye contact, normal affect. Memory intact not anxious or depressed appearing.  CNS: CN 2-12 intact, power,  normal throughout.no focal deficits noted.   Assessment & Plan Essential hypertension Uncontrolled, no med change, work on  [employer]  DASH diet and commitment to daily physical activity for a minimum of 30 minutes discussed and encouraged, as a part of hypertension management. The importance of attaining a healthy weight is also discussed.  BP/Weight 04/14/2019 12/19/2016 09/26/2016 03/11/2016 08/10/2014 3/66/4403 12/30/4257  Systolic BP 563 875 643 329 518 841 660  Diastolic BP 94 85 68 74 84 90 96  Wt. (Lbs) 199.04 - 200 198 200.4 197.04 198  BMI 36.4 - 36.58 36.21 36.64 36.03 36.21  Some encounter information is confidential and restricted. Go to Review Flowsheets activity to see all data.       Prediabetes Improved, applauded on this  Patient educated about the importance of limiting  Carbohydrate intake , the need to commit to daily physical activity for a minimum of 30 minutes , and to commit weight loss. The fact that changes in all these areas will reduce or eliminate all together the development of diabetes is stressed.   Diabetic Labs Latest Ref Rng & Units 04/07/2019 11/04/2018 07/01/2018 05/17/2018 12/13/2017  HbA1c <5.7 % of total Hgb 5.9(H) 6.1(H) 5.9(H) - -  Chol <200 mg/dL - 193 219(H) - -  HDL > OR = 50 mg/dL - 54 57 - -  Calc LDL mg/dL (calc) - 122(H) 140(H) - -  Triglycerides <150 mg/dL - 78 106 - -  Creatinine 0.50 - 1.05 mg/dL 0.63 0.62 0.53 0.65 0.50   BP/Weight 04/14/2019 12/19/2016 09/26/2016 03/11/2016 08/10/2014 03/24/1600 0/05/3234  Systolic BP 573 220 254 270 623 762 831  Diastolic  BP 94 85 68 74 84 90 96  Wt. (Lbs) 199.04 - 200 198 200.4 197.04 198  BMI 36.4 - 36.58 36.21 36.64 36.03 36.21  Some encounter information is confidential and restricted. Go to Review Flowsheets activity to see all data.   No flowsheet data found.    Nicotine dependence Asked:confirms currently smokes cigarettes Assess: Unwilling to quit but cutting back Advise: needs to QUIT to reduce risk of cancer, cardio and cerebrovascular disease Assist: counseled for 5 minutes and literature provided Arrange: follow up in 3  months   Morbid obesity ([place]) Obesity linked with hTN, prediabetes, arthritis  Patient re-educated about  the importance of commitment to a  minimum of 150 minutes of exercise per week as able.  The importance of healthy food choices with portion control discussed, as well as eating regularly and within a 12 hour window most days. The need to choose "clean , green" food 50 to 75% of the time is discussed, as well as to make water the primary drink and set a goal of 64 ounces water daily.    Weight /BMI 04/14/2019 09/26/2016 03/11/2016  WEIGHT 199 lb 0.6 oz 200 lb 198 lb  HEIGHT 5\' 2"  5\' 2"  5\' 2"   BMI 36.4 kg/m2 36.58 kg/m2 36.21 kg/m2  Some encounter information is confidential and restricted. Go to Review Flowsheets activity to see all data.      Degenerative arthritis of finger, left Samples of alleve provided,  No X ray  Hyperlipemia Hyperlipidemia:Low fat diet discussed and encouraged.   Lipid Panel  Lab Results  Component Value Date   CHOL 193 11/04/2018   HDL 54 11/04/2018   LDLCALC 122 (H) 11/04/2018   TRIG 78 11/04/2018   CHOLHDL 3.6 11/04/2018     Updated lab needed at/ before next visit.

## 2019-04-14 NOTE — Patient Instructions (Addendum)
Annual; with pap with MD first week in September, call if you need me before, weight loss goal of 5 pounds  Mammogram to be scheduled at checkout   Please continue to try to stop smoking  Fasting cBC, lipid ,TSH and Vit D 5 days before visit  BP is high so change in diet, daily exercise, , low salt, weight loss  Will get you to goal and we can do this  .Thanks for choosing [facility], we consider it a privelige to serve you.

## 2019-04-14 NOTE — Assessment & Plan Note (Signed)
Improved, applauded on this  Patient educated about the importance of limiting  Carbohydrate intake , the need to commit to daily physical activity for a minimum of 30 minutes , and to commit weight loss. The fact that changes in all these areas will reduce or eliminate all together the development of diabetes is stressed.   Diabetic Labs Latest Ref Rng & Units 04/07/2019 11/04/2018 07/01/2018 05/17/2018 12/13/2017  HbA1c <5.7 % of total Hgb 5.9(H) 6.1(H) 5.9(H) - -  Chol <200 mg/dL - 193 219(H) - -  HDL > OR = 50 mg/dL - 54 57 - -  Calc LDL mg/dL (calc) - 122(H) 140(H) - -  Triglycerides <150 mg/dL - 78 106 - -  Creatinine 0.50 - 1.05 mg/dL 0.63 0.62 0.53 0.65 0.50   BP/Weight 04/14/2019 12/19/2016 09/26/2016 03/11/2016 08/10/2014 3/70/4888 05/26/6944  Systolic BP 038 882 800 349 179 150 569  Diastolic BP 94 85 68 74 84 90 96  Wt. (Lbs) 199.04 - 200 198 200.4 197.04 198  BMI 36.4 - 36.58 36.21 36.64 36.03 36.21  Some encounter information is confidential and restricted. Go to Review Flowsheets activity to see all data.   No flowsheet data found.

## 2019-04-14 NOTE — Assessment & Plan Note (Signed)
Samples of alleve provided,  No X ray

## 2019-04-28 ENCOUNTER — Other Ambulatory Visit: Payer: Self-pay | Admitting: Family Medicine

## 2019-04-28 MED FILL — AMLODIPINE 2.5 MG TABLET: 2.5 | 90 days supply | Qty: 90 | Fill #0

## 2019-04-28 MED FILL — POTASSIUM CHLORIDE CRYS ER: 20 | 90 days supply | Qty: 90 | Fill #0

## 2019-04-29 DIAGNOSIS — Z20828 Contact with and (suspected) exposure to other viral communicable diseases: Secondary | ICD-10-CM | POA: Diagnosis not present

## 2019-05-19 ENCOUNTER — Encounter (HOSPITAL_COMMUNITY): Payer: Self-pay

## 2019-05-19 ENCOUNTER — Other Ambulatory Visit: Payer: Self-pay

## 2019-05-19 ENCOUNTER — Ambulatory Visit (HOSPITAL_COMMUNITY)
Admission: RE | Admit: 2019-05-19 | Discharge: 2019-05-19 | Disposition: A | Discharge status: Home / Self Care | Payer: 59 | Source: Ambulatory Visit | Attending: Family Medicine | Admitting: Family Medicine

## 2019-05-19 DIAGNOSIS — Z1231 Encounter for screening mammogram for malignant neoplasm of breast: Secondary | ICD-10-CM | POA: Diagnosis not present

## 2019-06-16 ENCOUNTER — Other Ambulatory Visit (HOSPITAL_COMMUNITY)
Admission: RE | Admit: 2019-06-16 | Discharge: 2019-06-16 | Disposition: A | Discharge status: Home / Self Care | Payer: 59 | Source: Ambulatory Visit | Attending: Family Medicine | Admitting: Family Medicine

## 2019-06-16 ENCOUNTER — Other Ambulatory Visit: Payer: Self-pay

## 2019-06-16 ENCOUNTER — Encounter: Payer: Self-pay | Admitting: Family Medicine

## 2019-06-16 ENCOUNTER — Ambulatory Visit (INDEPENDENT_AMBULATORY_CARE_PROVIDER_SITE_OTHER): Payer: 59 | Admitting: Family Medicine

## 2019-06-16 VITALS — BP 138/88 | HR 80 | Temp 97.9°F | Ht 62.0 in | Wt 199.1 lb

## 2019-06-16 DIAGNOSIS — I10 Essential (primary) hypertension: Secondary | ICD-10-CM

## 2019-06-16 DIAGNOSIS — E559 Vitamin D deficiency, unspecified: Secondary | ICD-10-CM | POA: Diagnosis not present

## 2019-06-16 DIAGNOSIS — E7849 Other hyperlipidemia: Secondary | ICD-10-CM

## 2019-06-16 DIAGNOSIS — Z124 Encounter for screening for malignant neoplasm of cervix: Secondary | ICD-10-CM | POA: Insufficient documentation

## 2019-06-16 DIAGNOSIS — Z0000 Encounter for general adult medical examination without abnormal findings: Secondary | ICD-10-CM

## 2019-06-16 MED ORDER — AMLODIPINE BESYLATE 2.5 MG PO TABS: 2.5000 mg | ORAL_TABLET | Freq: Every day | ORAL | 1 refills | Status: DC

## 2019-06-16 MED ORDER — HYDROCHLOROTHIAZIDE 25 MG PO TABS: 25.0000 mg | ORAL_TABLET | Freq: Every day | ORAL | 1 refills | Status: DC

## 2019-06-16 MED ORDER — POTASSIUM CHLORIDE CRYS ER 20 MEQ PO TBCR: 20.0000 meq | EXTENDED_RELEASE_TABLET | Freq: Every day | ORAL | 1 refills | Status: DC

## 2019-06-16 NOTE — Patient Instructions (Addendum)
F/U in office with MD , last week in January, call if you need me sooner, shingles vaccine at next visit Please  monitor blood pressure every 1 to 2 weeks, goal of 135/85 or less  Labs are great, keep up good lifesttyle change please  Pap today  TSH, vit D, cmp and EGFR and lipid panel please 1 week before follow up

## 2019-06-18 ENCOUNTER — Encounter: Payer: Self-pay | Admitting: Family Medicine

## 2019-06-18 NOTE — Progress Notes (Signed)
    [name] [name]     MRN: [id number]      DOB: 1961-05-14  HPI: Patient is in for annual physical exam. No other health concerns are expressed or addressed at the visit. Recent labs, if available are reviewed. Immunization is reviewed , and  updated if needed.   PE: BP 138/88   Pulse 80   Temp 97.9 F (36.6 C) (Temporal)   Ht 5\' 2"  (1.575 m)   Wt 199 lb 1.9 oz (90.3 kg)   SpO2 97%   BMI 36.42 kg/m   Pleasant  female, alert and oriented x 3, in no cardio-pulmonary distress. Afebrile. HEENT No facial trauma or asymetry. Sinuses non tender.  Extra occullar muscles intact, .  Neck: supple, no adenopathy,JVD or thyromegaly.No bruits.  Chest: Clear to ascultation bilaterally.No crackles or wheezes. Non tender to palpation  Breast: no masses or lumps. No tenderness. No nipple discharge or inversion. No axillary or supraclavicular adenopathy  Cardiovascular system; Heart sounds normal,  S1 and  S2 ,no S3.  No murmur, or thrill. Apical beat not displaced Peripheral pulses normal.  Abdomen: Soft, non tender, no organomegaly or masses. No bruits. Bowel sounds normal. No guarding, tenderness or rebound.    GU: External genitalia normal female genitalia , normal female distribution of hair. No lesions. Urethral meatus normal in size, no  Prolapse, no lesions visibly  Present. Bladder non tender. Vagina pink and moist , with no visible lesions , discharge present . Adequate pelvic support no  cystocele or rectocele noted Cervix pink and appears healthy, no lesions or ulcerations noted, no discharge noted from os Uterus normal size, no adnexal masses, no cervical motion or adnexal tenderness.   Musculoskeletal exam: Full ROM of spine, hips , shoulders and knees. No deformity ,swelling or crepitus noted. No muscle wasting or atrophy.   Neurologic: Cranial nerves 2 to 12 intact. Power, tone ,sensation and reflexes normal throughout. No disturbance in gait. No  tremor.  Skin: Intact, no ulceration, erythema , scaling or rash noted. Pigmentation normal throughout  Psych; Normal mood and affect. Judgement and concentration normal   Assessment & Plan:  Annual physical exam Annual exam as documented. Counseling done  re healthy lifestyle involving commitment to 150 minutes exercise per week, heart healthy diet, and attaining healthy weight.The importance of adequate sleep also discussed. Regular seat belt use and home safety, is also discussed. Changes in health habits are decided on by the patient with goals and time frames  set for achieving them. Immunization and cancer screening needs are specifically addressed at this visit.

## 2019-06-18 NOTE — Assessment & Plan Note (Signed)

## 2019-06-23 LAB — CYTOLOGY - PAP
Diagnosis: NEGATIVE
High risk HPV: NEGATIVE
Molecular Disclaimer: 56
Molecular Disclaimer: NORMAL

## 2019-06-26 ENCOUNTER — Other Ambulatory Visit: Payer: Self-pay | Admitting: Family Medicine

## 2019-06-26 ENCOUNTER — Telehealth: Payer: Self-pay | Admitting: *Deleted

## 2019-06-26 MED FILL — IBUPROFEN 800 MG TABS: 800 | 30 days supply | Qty: 21 | Fill #0

## 2019-06-26 MED FILL — HYDROCHLOROTHIAZIDE 25 MG T: 25 | 90 days supply | Qty: 90 | Fill #1

## 2019-06-26 NOTE — Telephone Encounter (Signed)
[name] [name] with [facility] called wanting clarification on a prescription for the ibuprofen. The prescription says MAX three tabs per week and ususally this says three per day. Just wanted to clarify what Dr. Moshe Cipro wanted

## 2019-06-30 MED ORDER — IBUPROFEN 800 MG PO TABS: ORAL_TABLET | ORAL | 0 refills | Status: DC

## 2019-06-30 NOTE — Telephone Encounter (Signed)
3 times per week

## 2019-06-30 NOTE — Telephone Encounter (Signed)
Sent back to pharmacy with message and d/c'd tid dosing

## 2019-06-30 NOTE — Telephone Encounter (Signed)
Please advise The pharmacy wants to know if the directions should be 3 per day or TID.

## 2019-07-30 MED FILL — POTASSIUM CHLORIDE CRYS ER: 20 | 90 days supply | Qty: 90 | Fill #0

## 2019-07-30 MED FILL — AMLODIPINE 2.5 MG TABLET: 2.5 | 90 days supply | Qty: 90 | Fill #0

## 2019-09-24 MED FILL — HYDROCHLOROTHIAZIDE 25 MG T: 25 | 90 days supply | Qty: 90 | Fill #0

## 2019-09-24 MED FILL — IBUPROFEN 800 MG TABS: 800 | 30 days supply | Qty: 21 | Fill #0

## 2019-10-06 ENCOUNTER — Other Ambulatory Visit: Payer: Self-pay

## 2019-10-06 ENCOUNTER — Other Ambulatory Visit (HOSPITAL_COMMUNITY)
Admission: RE | Admit: 2019-10-06 | Discharge: 2019-10-06 | Disposition: A | Discharge status: Home / Self Care | Payer: Self-pay | Source: Ambulatory Visit | Attending: Family Medicine | Admitting: Family Medicine

## 2019-10-06 ENCOUNTER — Inpatient Hospital Stay (HOSPITAL_COMMUNITY): Payer: No Typology Code available for payment source | Attending: Hematology

## 2019-10-06 DIAGNOSIS — Z853 Personal history of malignant neoplasm of breast: Secondary | ICD-10-CM | POA: Diagnosis not present

## 2019-10-06 DIAGNOSIS — E559 Vitamin D deficiency, unspecified: Secondary | ICD-10-CM | POA: Insufficient documentation

## 2019-10-06 DIAGNOSIS — E7849 Other hyperlipidemia: Secondary | ICD-10-CM | POA: Insufficient documentation

## 2019-10-06 DIAGNOSIS — I10 Essential (primary) hypertension: Secondary | ICD-10-CM | POA: Insufficient documentation

## 2019-10-06 DIAGNOSIS — E876 Hypokalemia: Secondary | ICD-10-CM | POA: Insufficient documentation

## 2019-10-06 DIAGNOSIS — D473 Essential (hemorrhagic) thrombocythemia: Secondary | ICD-10-CM | POA: Diagnosis present

## 2019-10-06 DIAGNOSIS — D75839 Thrombocytosis, unspecified: Secondary | ICD-10-CM

## 2019-10-06 LAB — COMPREHENSIVE METABOLIC PANEL
ALT: 17 U/L (ref 0–44)
AST: 15 U/L (ref 15–41)
Albumin: 4.2 g/dL (ref 3.5–5.0)
Alkaline Phosphatase: 68 U/L (ref 38–126)
Anion gap: 10 (ref 5–15)
BUN: 13 mg/dL (ref 6–20)
CO2: 28 mmol/L (ref 22–32)
Calcium: 9.8 mg/dL (ref 8.9–10.3)
Chloride: 101 mmol/L (ref 98–111)
Creatinine, Ser: 0.56 mg/dL (ref 0.44–1.00)
GFR calc Af Amer: >60 mL/min (ref >60)
GFR calc non Af Amer: >60 mL/min (ref >60)
Glucose, Bld: 89 mg/dL (ref 70–99)
Potassium: 3.4 mmol/L — ABNORMAL LOW (ref 3.5–5.1)
Sodium: 139 mmol/L (ref 135–145)
Total Bilirubin: 0.7 mg/dL (ref 0.3–1.2)
Total Protein: 7.7 g/dL (ref 6.5–8.1)

## 2019-10-06 LAB — TSH: TSH: 1.249 u[IU]/mL (ref 0.350–4.500)

## 2019-10-06 LAB — CBC WITH DIFFERENTIAL/PLATELET
Abs Immature Granulocytes: 0.01 10*3/uL (ref 0.00–0.07)
Basophils Absolute: 0.1 10*3/uL (ref 0.0–0.1)
Basophils Relative: 1 %
Eosinophils Absolute: 0.2 10*3/uL (ref 0.0–0.5)
Eosinophils Relative: 2 %
HCT: 44.8 % (ref 36.0–46.0)
Hemoglobin: 14.5 g/dL (ref 12.0–15.0)
Immature Granulocytes: 0 %
Lymphocytes Relative: 32 %
Lymphs Abs: 2.9 10*3/uL (ref 0.7–4.0)
MCH: 30.1 pg (ref 26.0–34.0)
MCHC: 32.4 g/dL (ref 30.0–36.0)
MCV: 93.1 fL (ref 80.0–100.0)
Monocytes Absolute: 0.6 10*3/uL (ref 0.1–1.0)
Monocytes Relative: 6 %
Neutro Abs: 5.4 10*3/uL (ref 1.7–7.7)
Neutrophils Relative %: 59 %
Platelets: 467 10*3/uL — ABNORMAL HIGH (ref 150–400)
RBC: 4.81 MIL/uL (ref 3.87–5.11)
RDW: 13.9 % (ref 11.5–15.5)
WBC: 9 10*3/uL (ref 4.0–10.5)
nRBC: 0 % (ref 0.0–0.2)

## 2019-10-06 LAB — CBC
HCT: 44.6 % (ref 36.0–46.0)
Hemoglobin: 14.3 g/dL (ref 12.0–15.0)
MCH: 29.9 pg (ref 26.0–34.0)
MCHC: 32.1 g/dL (ref 30.0–36.0)
MCV: 93.3 fL (ref 80.0–100.0)
Platelets: 446 10*3/uL — ABNORMAL HIGH (ref 150–400)
RBC: 4.78 MIL/uL (ref 3.87–5.11)
RDW: 13.9 % (ref 11.5–15.5)
WBC: 8.9 10*3/uL (ref 4.0–10.5)
nRBC: 0 % (ref 0.0–0.2)

## 2019-10-06 LAB — LIPID PANEL
Cholesterol: 212 mg/dL — ABNORMAL HIGH (ref 0–200)
HDL: 51 mg/dL (ref >40)
LDL Cholesterol: 140 mg/dL — ABNORMAL HIGH (ref 0–99)
Total CHOL/HDL Ratio: 4.2 RATIO
Triglycerides: 107 mg/dL (ref <150)
VLDL: 21 mg/dL (ref 0–40)

## 2019-10-06 LAB — IRON AND TIBC
Iron: 97 ug/dL (ref 28–170)
Saturation Ratios: 25 % (ref 10.4–31.8)
TIBC: 394 ug/dL (ref 250–450)
UIBC: 297 ug/dL

## 2019-10-06 LAB — VITAMIN B12: Vitamin B-12: 391 pg/mL (ref 180–914)

## 2019-10-06 LAB — FOLATE: Folate: 13 ng/mL (ref >5.9)

## 2019-10-06 LAB — LACTATE DEHYDROGENASE: LDH: 149 U/L (ref 98–192)

## 2019-10-06 LAB — VITAMIN D 25 HYDROXY (VIT D DEFICIENCY, FRACTURES): Vit D, 25-Hydroxy: 29.12 ng/mL — ABNORMAL LOW (ref 30–100)

## 2019-10-06 LAB — FERRITIN: Ferritin: 83 ng/mL (ref 11–307)

## 2019-10-07 ENCOUNTER — Other Ambulatory Visit: Payer: Self-pay

## 2019-10-07 ENCOUNTER — Other Ambulatory Visit (HOSPITAL_COMMUNITY)
Admission: RE | Admit: 2019-10-07 | Discharge: 2019-10-07 | Disposition: A | Discharge status: Home / Self Care | Payer: No Typology Code available for payment source | Source: Ambulatory Visit | Attending: Family Medicine | Admitting: Family Medicine

## 2019-10-07 DIAGNOSIS — R7303 Prediabetes: Secondary | ICD-10-CM | POA: Insufficient documentation

## 2019-10-07 LAB — HEMOGLOBIN A1C
Hgb A1c MFr Bld: 5.9 % — ABNORMAL HIGH (ref 4.8–5.6)
Mean Plasma Glucose: 122.63 mg/dL

## 2019-10-13 ENCOUNTER — Encounter (HOSPITAL_COMMUNITY): Payer: Self-pay | Admitting: Nurse Practitioner

## 2019-10-13 ENCOUNTER — Inpatient Hospital Stay (HOSPITAL_COMMUNITY): Payer: No Typology Code available for payment source | Admitting: Nurse Practitioner

## 2019-10-13 ENCOUNTER — Other Ambulatory Visit: Payer: Self-pay

## 2019-10-13 DIAGNOSIS — D473 Essential (hemorrhagic) thrombocythemia: Secondary | ICD-10-CM | POA: Diagnosis not present

## 2019-10-13 DIAGNOSIS — D75839 Thrombocytosis, unspecified: Secondary | ICD-10-CM

## 2019-10-13 NOTE — Assessment & Plan Note (Addendum)
1.  Thrombocytosis: - Myeloproliferative work-up negative for Jak 2/exon 12/CALR/MPL. -Denies any vasomotor symptoms like erythromelalgia or aquagenic pruritus. - Labs today on 10/06/2019 was hemoglobin 14.3, hematocrit 44.6, WBC 8.9, platelets are slightly lower at 446. - Her platelet count over the last several years is close to 450.  If there is any significant change in her platelet count we will consider bone marrow aspiration and biopsy. - She will follow-up in 6 months with repeat labs.  2.  Left breast cancer: -Diagnosed in 2006, status post lumpectomy. -Reportedly underwent chemotherapy followed by radiation.  She did not receive antiestrogen therapy. -Mammogram dated 05/19/2019 shows B RADS category 1 negative.  3.  Hypokalemia: -She is on 20 mEq of potassium daily. - Her potassium level on 10/06/2019 was 3.4

## 2019-10-13 NOTE — Patient Instructions (Signed)
[facility] Discharge Instructions  Follow up in 6 months with labs    Thank you for choosing [facility] at [facility] to provide your oncology and hematology care.  To afford each patient quality time with our provider, please arrive at least 15 minutes before your scheduled appointment time.   If you have a lab appointment with the Cancer Center please come in thru the Main Entrance and check in at the main information desk.  You need to re-schedule your appointment should you arrive 10 or more minutes late.  We strive to give you quality time with our providers, and arriving late affects you and other patients whose appointments are after yours.  Also, if you no show three or more times for appointments you may be dismissed from the clinic at the providers discretion.     Again, thank you for choosing [facility].  Our hope is that these requests will decrease the amount of time that you wait before being seen by our physicians.       _____________________________________________________________  Should you have questions after your visit to [facility], please contact our office at [phone] between the hours of 8:00 a.m. and 4:30 p.m.  Voicemails left after 4:00 p.m. will not be returned until the following business day.  For prescription refill requests, have your pharmacy contact our office and allow 72 hours.    Due to Covid, you will need to wear a mask upon entering the hospital. If you do not have a mask, a mask will be given to you at the Main Entrance upon arrival. For doctor visits, patients may have 1 support person with them. For treatment visits, patients can not have anyone with them due to social distancing guidelines and our immunocompromised population.      

## 2019-10-13 NOTE — Progress Notes (Signed)
[place], [place] [address]   CLINIC:  Medical Oncology/Hematology  PCP:  Fayrene Helper, MD [address] [place] [address] [phone]   REASON FOR VISIT: Follow-up for thrombocytosis  CURRENT THERAPY: Observation   INTERVAL HISTORY:  Ms. Awadalla 59 y.o. female returns for routine follow-up for thrombocytosis.  Patient reports she has been doing well since her last visit.  She denies any changes in her health.  She denies any new diagnosis of any blood clots.  She denies any bright red bleeding per rectum or melena.  She denies any headaches or vision changes. Denies any nausea, vomiting, or diarrhea. Denies any new pains. Had not noticed any recent bleeding such as epistaxis, hematuria or hematochezia. Denies recent chest pain on exertion, shortness of breath on minimal exertion, pre-syncopal episodes, or palpitations. Denies any numbness or tingling in hands or feet. Denies any recent fevers, infections, or recent hospitalizations. Patient reports appetite at 100% and energy level at 100%.  She is eating well maintain her weight at this time.     REVIEW OF SYSTEMS:  Review of Systems  All other systems reviewed and are negative.    PAST MEDICAL/SURGICAL HISTORY:  Past Medical History:  Diagnosis Date  . Adenocarcinoma of breast ([place])    left   . Heel spur   . Hyperlipidemia   . Hypertension   . Migraines   . Obesity    Past Surgical History:  Procedure Laterality Date  . BREAST SURGERY    . btl  1992  . CHOLECYSTECTOMY    . COLONOSCOPY  09/15/2011   Procedure: COLONOSCOPY;  Surgeon: Dorothyann Peng, MD;  [facility];  Service: Endoscopy;  Laterality: N/A;  9:15 AM  . COLONOSCOPY N/A 08/16/2018   Procedure: COLONOSCOPY;  Surgeon: Danie Binder, MD;  Location: [facility];  Service: Endoscopy;  Laterality: N/A;  3:00  . left lumpectomy and lymph node disection  2000  . POLYPECTOMY   08/16/2018   Procedure: POLYPECTOMY;  Surgeon: Danie Binder, MD;  Location: [facility];  Service: Endoscopy;;  ascending colon(CSx3), descending colon(CSx1)  . r heel surgery for spur and chipped bone  11/09/20099  . TUBAL LIGATION       SOCIAL HISTORY:  Social History   Socioeconomic History  . Marital status: Married    Spouse name: Not on file  . Number of children: Not on file  . Years of education: Not on file  . Highest education level: Not on file  Occupational History  . Not on file  Tobacco Use  . Smoking status: Current Every Day Smoker    Packs/day: 0.25    Years: 15.00    Pack years: 3.75    Types: Cigarettes  . Smokeless tobacco: Never Used  . Tobacco comment: smoking 3 per day  Substance and Sexual Activity  . Alcohol use: No    Alcohol/week: 0.0 standard drinks  . Drug use: No    Comment: pt states "3 cigarrettes per day"  . Sexual activity: Yes    Birth control/protection: Post-menopausal  Other Topics Concern  . Not on file  Social History Narrative  . Not on file   Social Determinants of Health   Financial Resource Strain:   . Difficulty of Paying Living Expenses: Not on file  Food Insecurity:   . Worried About Charity fundraiser in the Last Year: Not on file  . Ran  Out of Food in the Last Year: Not on file  Transportation Needs:   . Lack of Transportation (Medical): Not on file  . Lack of Transportation (Non-Medical): Not on file  Physical Activity:   . Days of Exercise per Week: Not on file  . Minutes of Exercise per Session: Not on file  Stress:   . Feeling of Stress : Not on file  Social Connections:   . Frequency of Communication with Friends and Family: Not on file  . Frequency of Social Gatherings with Friends and Family: Not on file  . Attends Religious Services: Not on file  . Active Member of Clubs or Organizations: Not on file  . Attends Archivist Meetings: Not on file  . Marital Status: Not on file  Intimate  Partner Violence:   . Fear of Current or Ex-Partner: Not on file  . Emotionally Abused: Not on file  . Physically Abused: Not on file  . Sexually Abused: Not on file    FAMILY HISTORY:  Family History  Problem Relation Age of Onset  . Heart attack Mother   . Heart attack Father   . Glaucoma Father   . Hypertension Father   . Diabetes Sister   . Anesthesia problems Neg Hx   . Hypotension Neg Hx   . Malignant hyperthermia Neg Hx   . Pseudochol deficiency Neg Hx   . Migraines Neg Hx     CURRENT MEDICATIONS:  Outpatient Encounter Medications as of 10/13/2019  Medication Sig  . amLODipine (NORVASC) 2.5 MG tablet Take 1 tablet (2.5 mg total) by mouth daily.  . Biotin 1000 MCG tablet Take 1,000 mcg by mouth daily at 6 (six) AM.   . hydrochlorothiazide (HYDRODIURIL) 25 MG tablet Take 1 tablet (25 mg total) by mouth daily.  . potassium chloride SA (K-DUR) 20 MEQ tablet Take 1 tablet (20 mEq total) by mouth daily.  . fluticasone (FLONASE) 50 MCG/ACT nasal spray Place 2 sprays into both nostrils daily. (Patient not taking: Reported on 10/13/2019)  . ibuprofen (ADVIL) 800 MG tablet TAKE 1 TABLET BY MOUTH DAILY AS NEEDED (MAX OF 3 TABLETS PER WEEK) (Patient not taking: Reported on 10/13/2019)   No facility-administered encounter medications on file as of 10/13/2019.    ALLERGIES:  Allergies  Allergen Reactions  . Ace Inhibitors Cough     PHYSICAL EXAM:  ECOG Performance status: 1  Vitals:   10/13/19 0947  BP: 127/83  Pulse: 89  Resp: 18  Temp: (!) 97.3 F (36.3 C)  SpO2: 100%   Filed Weights   10/13/19 0947  Weight: 198 lb 1.6 oz (89.9 kg)    Physical Exam Constitutional:      Appearance: Normal appearance. She is normal weight.  Cardiovascular:     Rate and Rhythm: Normal rate and regular rhythm.     Heart sounds: Normal heart sounds.  Pulmonary:     Effort: Pulmonary effort is normal.     Breath sounds: Normal breath sounds.  Abdominal:     General: Bowel sounds  are normal.     Palpations: Abdomen is soft.  Musculoskeletal:        General: Normal range of motion.  Skin:    General: Skin is warm.  Neurological:     Mental Status: She is alert and oriented to person, place, and time. Mental status is at baseline.  Psychiatric:        Mood and Affect: Mood normal.        Behavior:  Behavior normal.        Thought Content: Thought content normal.        Judgment: Judgment normal.      LABORATORY DATA:  I have reviewed the labs as listed.  CBC    Component Value Date/Time   WBC 8.9 10/06/2019 1029   RBC 4.78 10/06/2019 1029   HGB 14.3 10/06/2019 1029   HGB 13.7 04/09/2017 1445   HCT 44.6 10/06/2019 1029   HCT 39.9 04/09/2017 1445   PLT 446 (H) 10/06/2019 1029   PLT 478 (H) 04/09/2017 1445   MCV 93.3 10/06/2019 1029   MCV 88 04/09/2017 1445   MCH 29.9 10/06/2019 1029   MCHC 32.1 10/06/2019 1029   RDW 13.9 10/06/2019 1029   RDW 14.9 04/09/2017 1445   LYMPHSABS 2.9 10/06/2019 1024   LYMPHSABS 4.5 (H) 04/09/2017 1445   MONOABS 0.6 10/06/2019 1024   EOSABS 0.2 10/06/2019 1024   EOSABS 0.2 04/09/2017 1445   BASOSABS 0.1 10/06/2019 1024   BASOSABS 0.1 04/09/2017 1445   CMP Latest Ref Rng & Units 10/06/2019 04/07/2019 11/04/2018  Glucose 70 - 99 mg/dL 89 87 89  BUN 6 - 20 mg/dL 13 9 14   Creatinine 0.44 - 1.00 mg/dL 0.56 0.63 0.62  Sodium 135 - 145 mmol/L 139 140 142  Potassium 3.5 - 5.1 mmol/L 3.4(L) 4.1 4.3  Chloride 98 - 111 mmol/L 101 104 105  CO2 22 - 32 mmol/L 28 31 28   Calcium 8.9 - 10.3 mg/dL 9.8 10.1 10.1  Total Protein 6.5 - 8.1 g/dL 7.7 - 7.1  Total Bilirubin 0.3 - 1.2 mg/dL 0.7 - 0.4  Alkaline Phos 38 - 126 U/L 68 - -  AST 15 - 41 U/L 15 - 13  ALT 0 - 44 U/L 17 - 12     I personally performed a face-to-face visit.  All questions were answered to patient's stated satisfaction. Encouraged patient to call with any new concerns or questions before his next visit to the cancer center and we can certain see him sooner, if  needed.     ASSESSMENT & PLAN:   Thrombocytosis ([place]) 1.  Thrombocytosis: - Myeloproliferative work-up negative for Jak 2/exon 12/CALR/MPL. -Denies any vasomotor symptoms like erythromelalgia or aquagenic pruritus. - Labs today on 10/06/2019 was hemoglobin 14.3, hematocrit 44.6, WBC 8.9, platelets are slightly lower at 446. - Her platelet count over the last several years is close to 450.  If there is any significant change in her platelet count we will consider bone marrow aspiration and biopsy. - She will follow-up in 6 months with repeat labs.  2.  Left breast cancer: -Diagnosed in 2006, status post lumpectomy. -Reportedly underwent chemotherapy followed by radiation.  She did not receive antiestrogen therapy. -Mammogram dated 05/19/2019 shows B RADS category 1 negative.  3.  Hypokalemia: -She is on 20 mEq of potassium daily. - Her potassium level on 10/06/2019 was 3.4      Orders placed this encounter:  Orders Placed This Encounter  Procedures  . Lactate dehydrogenase  . CBC with Differential/Platelet  . Comprehensive metabolic panel  . Ferritin  . Iron and TIBC  . Vitamin B12  . Vitamin D 25 hydroxy  . Folate     Francene Finders, FNP-C [facility] [phone]

## 2019-10-20 ENCOUNTER — Ambulatory Visit (INDEPENDENT_AMBULATORY_CARE_PROVIDER_SITE_OTHER): Payer: No Typology Code available for payment source | Admitting: Family Medicine

## 2019-10-20 ENCOUNTER — Encounter: Payer: Self-pay | Admitting: Family Medicine

## 2019-10-20 ENCOUNTER — Other Ambulatory Visit: Payer: Self-pay

## 2019-10-20 VITALS — BP 124/86 | HR 89 | Temp 97.5°F | Resp 15 | Ht 62.0 in | Wt 194.0 lb

## 2019-10-20 DIAGNOSIS — R7301 Impaired fasting glucose: Secondary | ICD-10-CM

## 2019-10-20 DIAGNOSIS — E7849 Other hyperlipidemia: Secondary | ICD-10-CM

## 2019-10-20 DIAGNOSIS — E559 Vitamin D deficiency, unspecified: Secondary | ICD-10-CM

## 2019-10-20 DIAGNOSIS — J302 Other seasonal allergic rhinitis: Secondary | ICD-10-CM | POA: Diagnosis not present

## 2019-10-20 DIAGNOSIS — I10 Essential (primary) hypertension: Secondary | ICD-10-CM

## 2019-10-20 DIAGNOSIS — F17218 Nicotine dependence, cigarettes, with other nicotine-induced disorders: Secondary | ICD-10-CM

## 2019-10-20 DIAGNOSIS — R7303 Prediabetes: Secondary | ICD-10-CM

## 2019-10-20 NOTE — Assessment & Plan Note (Signed)
Patient educated about the importance of limiting  Carbohydrate intake , the need to commit to daily physical activity for a minimum of 30 minutes , and to commit weight loss. The fact that changes in all these areas will reduce or eliminate all together the development of diabetes is stressed.   Diabetic Labs Latest Ref Rng & Units 10/06/2019 04/07/2019 11/04/2018 07/01/2018 05/17/2018  HbA1c 4.8 - 5.6 % 5.9(H) 5.9(H) 6.1(H) 5.9(H) -  Chol 0 - 200 mg/dL 212(H) - 193 219(H) -  HDL >40 mg/dL 51 - 54 57 -  Calc LDL 0 - 99 mg/dL 140(H) - 122(H) 140(H) -  Triglycerides <150 mg/dL 107 - 78 106 -  Creatinine 0.44 - 1.00 mg/dL 0.56 0.63 0.62 0.53 0.65   BP/Weight 10/20/2019 10/13/2019 06/16/2019 04/14/2019 04/14/2019 [id number]  Systolic BP A999333 [id number] 0000000 [id number]  Diastolic BP 86 83 88 88 94 85 68  Wt. (Lbs) 194 198.1 199.12 198 199.04 - 200  BMI 35.48 36.23 36.42 36.21 36.4 - 36.58  Some encounter information is confidential and restricted. Go to Review Flowsheets activity to see all data.   No flowsheet data found.

## 2019-10-20 NOTE — Progress Notes (Signed)
[name] [name]     MRN: [id number]      DOB: 1960/11/30   HPI [name] [name] is here for follow up and re-evaluation of chronic medical conditions, medication management and review of any available recent lab and radiology data.  Preventive health is updated, specifically  Cancer screening and Immunization.   Questions or concerns regarding consultations or procedures which the PT has had in the interim are  addressed. The PT denies any adverse reactions to current medications since the last visit.  There are no new concerns.  There are no specific complaints   ROS Denies recent fever or chills. Denies sinus pressure, nasal congestion, ear pain or sore throat. Denies chest congestion, productive cough or wheezing. Denies chest pains, palpitations and leg swelling Denies abdominal pain, nausea, vomiting,diarrhea or constipation.   Denies dysuria, frequency, hesitancy or incontinence. Denies joint pain, swelling and limitation in mobility. Denies headaches, seizures, numbness, or tingling. Denies depression, anxiety or insomnia. Denies skin break down or rash.   PE  BP 124/86   Pulse 89   Temp (!) 97.5 F (36.4 C) (Temporal)   Resp 15   Ht 5\' 2"  (1.575 m)   Wt 194 lb (88 kg)   SpO2 99%   BMI 35.48 kg/m   Patient alert and oriented and in no cardiopulmonary distress.  HEENT: No facial asymmetry, EOMI,     Neck supple .  Chest: Clear to auscultation bilaterally.  CVS: S1, S2 no murmurs, no S3.Regular rate.  ABD: Soft non tender.   Ext: No edema  MS: Adequate ROM spine, shoulders, hips and knees.  Skin: Intact, no ulcerations or rash noted.  Psych: Good eye contact, normal affect. Memory intact not anxious or depressed appearing.  CNS: CN 2-12 intact, power,  normal throughout.no focal deficits noted.   Assessment & Plan  Essential hypertension Controlled, no change in medication DASH diet and commitment to daily physical activity for a minimum of 30 minutes  discussed and encouraged, as a part of hypertension management. The importance of attaining a healthy weight is also discussed.  BP/Weight 10/20/2019 10/13/2019 06/16/2019 04/14/2019 04/14/2019 [id number]  Systolic BP A999333 [id number] 0000000 [id number]  Diastolic BP 86 83 88 88 94 85 68  Wt. (Lbs) 194 198.1 199.12 198 199.04 - 200  BMI 35.48 36.23 36.42 36.21 36.4 - 36.58  Some encounter information is confidential and restricted. Go to Review Flowsheets activity to see all data.       Allergic rhinitis No current flare  Morbid obesity ([place])  Patient re-educated about  the importance of commitment to a  minimum of 150 minutes of exercise per week as able.  The importance of healthy food choices with portion control discussed, as well as eating regularly and within a 12 hour window most days. The need to choose "clean , green" food 50 to 75% of the time is discussed, as well as to make water the primary drink and set a goal of 64 ounces water daily.    Weight /BMI 10/20/2019 10/13/2019 06/16/2019  WEIGHT 194 lb 198 lb 1.6 oz 199 lb 1.9 oz  HEIGHT 5\' 2"  - 5\' 2"   BMI 35.48 kg/m2 36.23 kg/m2 36.42 kg/m2  Some encounter information is confidential and restricted. Go to Review Flowsheets activity to see all data.      Hyperlipemia Hyperlipidemia:Low fat diet discussed and encouraged.   Lipid Panel  Lab Results  Component Value Date   CHOL 212 (H) 10/06/2019  HDL 51 10/06/2019   LDLCALC 140 (H) 10/06/2019   TRIG 107 10/06/2019   CHOLHDL 4.2 10/06/2019   Need to reduce fat in dioet    Nicotine dependence Asked:confirms currently smokes cigarettes Assess: Unwilling to quit but cutting back Advise: needs to QUIT to reduce risk of cancer, cardio and cerebrovascular disease Assist: counseled for 5 minutes and literature provided Arrange: follow up in 3 months   Vitamin D deficiency Commit to daily vit D after you finish the once weekly vit D  Prediabetes Patient educated  about the importance of limiting  Carbohydrate intake , the need to commit to daily physical activity for a minimum of 30 minutes , and to commit weight loss. The fact that changes in all these areas will reduce or eliminate all together the development of diabetes is stressed.   Diabetic Labs Latest Ref Rng & Units 10/06/2019 04/07/2019 11/04/2018 07/01/2018 05/17/2018  HbA1c 4.8 - 5.6 % 5.9(H) 5.9(H) 6.1(H) 5.9(H) -  Chol 0 - 200 mg/dL 212(H) - 193 219(H) -  HDL >40 mg/dL 51 - 54 57 -  Calc LDL 0 - 99 mg/dL 140(H) - 122(H) 140(H) -  Triglycerides <150 mg/dL 107 - 78 106 -  Creatinine 0.44 - 1.00 mg/dL 0.56 0.63 0.62 0.53 0.65   BP/Weight 10/20/2019 10/13/2019 06/16/2019 04/14/2019 04/14/2019 [id number]  Systolic BP A999333 [id number] 0000000 [id number]  Diastolic BP 86 83 88 88 94 85 68  Wt. (Lbs) 194 198.1 199.12 198 199.04 - 200  BMI 35.48 36.23 36.42 36.21 36.4 - 36.58  Some encounter information is confidential and restricted. Go to Review Flowsheets activity to see all data.   No flowsheet data found.

## 2019-10-20 NOTE — Assessment & Plan Note (Signed)
Hyperlipidemia:Low fat diet discussed and encouraged.   Lipid Panel  Lab Results  Component Value Date   CHOL 212 (H) 10/06/2019   HDL 51 10/06/2019   LDLCALC 140 (H) 10/06/2019   TRIG 107 10/06/2019   CHOLHDL 4.2 10/06/2019   Need to reduce fat in dioet

## 2019-10-20 NOTE — Addendum Note (Signed)
Addended by: Eual Fines on: 10/20/2019 09:18 AM   Modules accepted: Orders

## 2019-10-20 NOTE — Assessment & Plan Note (Signed)
  Patient re-educated about  the importance of commitment to a  minimum of 150 minutes of exercise per week as able.  The importance of healthy food choices with portion control discussed, as well as eating regularly and within a 12 hour window most days. The need to choose "clean , green" food 50 to 75% of the time is discussed, as well as to make water the primary drink and set a goal of 64 ounces water daily.    Weight /BMI 10/20/2019 10/13/2019 06/16/2019  WEIGHT 194 lb 198 lb 1.6 oz 199 lb 1.9 oz  HEIGHT 5\' 2"  - 5\' 2"   BMI 35.48 kg/m2 36.23 kg/m2 36.42 kg/m2  Some encounter information is confidential and restricted. Go to Review Flowsheets activity to see all data.

## 2019-10-20 NOTE — Patient Instructions (Addendum)
F/u in office in 6  Months call if you need me before   Please increase fruit and vegetable, and commit to exercise  [place] regularly  Fasting lipid, cmp and EGFr, hBA1C 5 days before next visit  After you finish the once weekly vitamin D 3 , pleas commit to oTC once daily vitamin D 3 , 2000 iU  Please work on , and commit to sopping smoking.  Congrats on weight loss  You will receive a list of potassium rich foods also  Steps to Quit Smoking Smoking tobacco is the leading cause of preventable death. It can affect almost every organ in the body. Smoking puts you and people around you at risk for many serious, long-lasting (chronic) diseases. Quitting smoking can be hard, but it is one of the best things that you can do for your health. It is never too late to quit. How do I get ready to quit? When you decide to quit smoking, make a plan to help you succeed. Before you quit:  Pick a date to quit. Set a date within the next 2 weeks to give you time to prepare.  Write down the reasons why you are quitting. Keep this list in places where you will see it often.  Tell your family, friends, and co-workers that you are quitting. Their support is important.  Talk with your doctor about the choices that may help you quit.  Find out if your health insurance will pay for these treatments.  Know the people, places, things, and activities that make you want to smoke (triggers). Avoid them. What first steps can I take to quit smoking?  Throw away all cigarettes at home, at work, and in your car.  Throw away the things that you use when you smoke, such as ashtrays and lighters.  Clean your car. Make sure to empty the ashtray.  Clean your home, including curtains and carpets. What can I do to help me quit smoking? Talk with your doctor about taking medicines and seeing a counselor at the same time. You are more likely to succeed when you do both.  If you are pregnant or breastfeeding, talk  with your doctor about counseling or other ways to quit smoking. Do not take medicine to help you quit smoking unless your doctor tells you to do so. To quit smoking: Quit right away  Quit smoking totally, instead of slowly cutting back on how much you smoke over a period of time.  Go to counseling. You are more likely to quit if you go to counseling sessions regularly. Take medicine You may take medicines to help you quit. Some medicines need a prescription, and some you can buy over-the-counter. Some medicines may contain a drug called nicotine to replace the nicotine in cigarettes. Medicines may:  Help you to stop having the desire to smoke (cravings).  Help to stop the problems that come when you stop smoking (withdrawal symptoms). Your doctor may ask you to use:  Nicotine patches, gum, or lozenges.  Nicotine inhalers or sprays.  Non-nicotine medicine that is taken by mouth. Find resources Find resources and other ways to help you quit smoking and remain smoke-free after you quit. These resources are most helpful when you use them often. They include:  Online chats with a Social worker.  Phone quitlines.  Printed Furniture conservator/restorer.  Support groups or group counseling.  Text messaging programs.  Mobile phone apps. Use apps on your mobile phone or tablet that can help you stick  to your quit plan. There are many free apps for mobile phones and tablets as well as websites. Examples include Quit Guide from the [employer] and smokefree.gov  What things can I do to make it easier to quit?   Talk to your family and friends. Ask them to support and encourage you.  Call a phone quitline (1-800-QUIT-NOW), reach out to support groups, or work with a Social worker.  Ask people who smoke to not smoke around you.  Avoid places that make you want to smoke, such as: ? Bars. ? Parties. ? Smoke-break areas at work.  Spend time with people who do not smoke.  Lower the stress in your life.  Stress can make you want to smoke. Try these things to help your stress: ? Getting regular exercise. ? Doing deep-breathing exercises. ? Doing yoga. ? Meditating. ? Doing a body scan. To do this, close your eyes, focus on one area of your body at a time from head to toe. Notice which parts of your body are tense. Try to relax the muscles in those areas. How will I feel when I quit smoking? Day 1 to 3 weeks Within the first 24 hours, you may start to have some problems that come from quitting tobacco. These problems are very bad 2-3 days after you quit, but they do not often last for more than 2-3 weeks. You may get these symptoms:  Mood swings.  Feeling restless, nervous, angry, or annoyed.  Trouble concentrating.  Dizziness.  Strong desire for high-sugar foods and nicotine.  Weight gain.  Trouble pooping (constipation).  Feeling like you may vomit (nausea).  Coughing or a sore throat.  Changes in how the medicines that you take for other issues work in your body.  Depression.  Trouble sleeping (insomnia). Week 3 and afterward After the first 2-3 weeks of quitting, you may start to notice more positive results, such as:  Better sense of smell and taste.  Less coughing and sore throat.  Slower heart rate.  Lower blood pressure.  Clearer skin.  Better breathing.  Fewer sick days. Quitting smoking can be hard. Do not give up if you fail the first time. Some people need to try a few times before they succeed. Do your best to stick to your quit plan, and talk with your doctor if you have any questions or concerns. Summary  Smoking tobacco is the leading cause of preventable death. Quitting smoking can be hard, but it is one of the best things that you can do for your health.  When you decide to quit smoking, make a plan to help you succeed.  Quit smoking right away, not slowly over a period of time.  When you start quitting, seek help from your doctor, family, or  friends. This information is not intended to replace advice given to you by your health care provider. Make sure you discuss any questions you have with your health care provider. Document Revised: 06/06/2019 Document Reviewed: 11/30/2018 Elsevier Patient Education  [place].

## 2019-10-20 NOTE — Assessment & Plan Note (Signed)
Asked:confirms currently smokes cigarettes Assess: Unwilling to quit but cutting back Advise: needs to QUIT to reduce risk of cancer, cardio and cerebrovascular disease Assist: counseled for 5 minutes and literature provided Arrange: follow up in 3 months  

## 2019-10-20 NOTE — Assessment & Plan Note (Signed)
No current flare 

## 2019-10-20 NOTE — Assessment & Plan Note (Signed)
Commit to daily vit D after you finish the once weekly vit D

## 2019-10-20 NOTE — Assessment & Plan Note (Signed)
Controlled, no change in medication DASH diet and commitment to daily physical activity for a minimum of 30 minutes discussed and encouraged, as a part of hypertension management. The importance of attaining a healthy weight is also discussed.  BP/Weight 10/20/2019 10/13/2019 06/16/2019 04/14/2019 04/14/2019 [id number]  Systolic BP A999333 AB-[id number] 0000000 [id number] AB-[id number] [id number]  Diastolic BP 86 83 88 88 94 85 68  Wt. (Lbs) 194 198.1 199.12 198 199.04 - 200  BMI 35.48 36.23 36.42 36.21 36.4 - 36.58  Some encounter information is confidential and restricted. Go to Review Flowsheets activity to see all data.

## 2019-10-30 MED FILL — AMLODIPINE 2.5 MG TABLET: 2.5 | 90 days supply | Qty: 90 | Fill #1

## 2019-10-30 MED FILL — POTASSIUM CHLORIDE CRYS ER: 20 | 90 days supply | Qty: 90 | Fill #1

## 2019-12-23 MED FILL — HYDROCHLOROTHIAZIDE 25 MG T: 25 | 90 days supply | Qty: 90 | Fill #1

## 2020-01-21 ENCOUNTER — Other Ambulatory Visit: Payer: Self-pay | Admitting: Family Medicine

## 2020-01-23 ENCOUNTER — Other Ambulatory Visit: Payer: Self-pay | Admitting: Family Medicine

## 2020-01-26 MED FILL — FLUTICASONE PROP 50 MCG SPR: 50 | 90 days supply | Qty: 48 | Fill #0

## 2020-01-27 ENCOUNTER — Other Ambulatory Visit: Payer: Self-pay

## 2020-01-27 MED ORDER — IBUPROFEN 800 MG PO TABS: ORAL_TABLET | ORAL | 0 refills | Status: DC

## 2020-01-27 MED FILL — IBUPROFEN 800 MG TABS: 800 | 21 days supply | Qty: 21 | Fill #0

## 2020-02-02 ENCOUNTER — Other Ambulatory Visit: Payer: Self-pay | Admitting: Family Medicine

## 2020-02-02 DIAGNOSIS — I10 Essential (primary) hypertension: Secondary | ICD-10-CM

## 2020-03-25 ENCOUNTER — Other Ambulatory Visit: Payer: Self-pay | Admitting: Family Medicine

## 2020-03-25 DIAGNOSIS — I10 Essential (primary) hypertension: Secondary | ICD-10-CM

## 2020-03-25 MED FILL — HYDROCHLOROTHIAZIDE 25 MG T: 25 | 90 days supply | Qty: 90 | Fill #0

## 2020-04-05 ENCOUNTER — Other Ambulatory Visit: Payer: Self-pay

## 2020-04-05 ENCOUNTER — Inpatient Hospital Stay (HOSPITAL_COMMUNITY): Payer: No Typology Code available for payment source

## 2020-04-05 ENCOUNTER — Inpatient Hospital Stay (HOSPITAL_COMMUNITY): Payer: No Typology Code available for payment source | Attending: Hematology

## 2020-04-05 DIAGNOSIS — D75839 Thrombocytosis, unspecified: Secondary | ICD-10-CM

## 2020-04-05 DIAGNOSIS — D473 Essential (hemorrhagic) thrombocythemia: Secondary | ICD-10-CM | POA: Insufficient documentation

## 2020-04-05 LAB — COMPREHENSIVE METABOLIC PANEL
ALT: 16 U/L (ref 0–44)
AST: 16 U/L (ref 15–41)
Albumin: 4.2 g/dL (ref 3.5–5.0)
Alkaline Phosphatase: 68 U/L (ref 38–126)
Anion gap: 11 (ref 5–15)
BUN: 18 mg/dL (ref 6–20)
CO2: 27 mmol/L (ref 22–32)
Calcium: 9.9 mg/dL (ref 8.9–10.3)
Chloride: 106 mmol/L (ref 98–111)
Creatinine, Ser: 0.84 mg/dL (ref 0.44–1.00)
GFR calc Af Amer: >60 mL/min (ref >60)
GFR calc non Af Amer: >60 mL/min (ref >60)
Glucose, Bld: 83 mg/dL (ref 70–99)
Potassium: 3.6 mmol/L (ref 3.5–5.1)
Sodium: 144 mmol/L (ref 135–145)
Total Bilirubin: 0.4 mg/dL (ref 0.3–1.2)
Total Protein: 7.6 g/dL (ref 6.5–8.1)

## 2020-04-05 LAB — CBC WITH DIFFERENTIAL/PLATELET
Abs Immature Granulocytes: 0.03 10*3/uL (ref 0.00–0.07)
Basophils Absolute: 0.1 10*3/uL (ref 0.0–0.1)
Basophils Relative: 1 %
Eosinophils Absolute: 0.3 10*3/uL (ref 0.0–0.5)
Eosinophils Relative: 2 %
HCT: 43.4 % (ref 36.0–46.0)
Hemoglobin: 14.1 g/dL (ref 12.0–15.0)
Immature Granulocytes: 0 %
Lymphocytes Relative: 47 %
Lymphs Abs: 4.9 10*3/uL — ABNORMAL HIGH (ref 0.7–4.0)
MCH: 30.1 pg (ref 26.0–34.0)
MCHC: 32.5 g/dL (ref 30.0–36.0)
MCV: 92.5 fL (ref 80.0–100.0)
Monocytes Absolute: 0.8 10*3/uL (ref 0.1–1.0)
Monocytes Relative: 7 %
Neutro Abs: 4.5 10*3/uL (ref 1.7–7.7)
Neutrophils Relative %: 43 %
Platelets: 479 10*3/uL — ABNORMAL HIGH (ref 150–400)
RBC: 4.69 MIL/uL (ref 3.87–5.11)
RDW: 14 % (ref 11.5–15.5)
WBC: 10.4 10*3/uL (ref 4.0–10.5)
nRBC: 0 % (ref 0.0–0.2)

## 2020-04-05 LAB — IRON AND TIBC
Iron: 57 ug/dL (ref 28–170)
Saturation Ratios: 15 % (ref 10.4–31.8)
TIBC: 390 ug/dL (ref 250–450)
UIBC: 333 ug/dL

## 2020-04-05 LAB — FOLATE: Folate: 11.4 ng/mL (ref >5.9)

## 2020-04-05 LAB — LACTATE DEHYDROGENASE: LDH: 144 U/L (ref 98–192)

## 2020-04-05 LAB — VITAMIN B12: Vitamin B-12: 377 pg/mL (ref 180–914)

## 2020-04-05 LAB — VITAMIN D 25 HYDROXY (VIT D DEFICIENCY, FRACTURES): Vit D, 25-Hydroxy: 19.41 ng/mL — ABNORMAL LOW (ref 30–100)

## 2020-04-05 LAB — FERRITIN: Ferritin: 72 ng/mL (ref 11–307)

## 2020-04-13 ENCOUNTER — Other Ambulatory Visit (HOSPITAL_COMMUNITY): Payer: Self-pay | Admitting: Nurse Practitioner

## 2020-04-13 ENCOUNTER — Inpatient Hospital Stay (HOSPITAL_COMMUNITY): Payer: No Typology Code available for payment source | Attending: Nurse Practitioner | Admitting: Nurse Practitioner

## 2020-04-13 DIAGNOSIS — D75839 Thrombocytosis, unspecified: Secondary | ICD-10-CM

## 2020-04-13 DIAGNOSIS — D473 Essential (hemorrhagic) thrombocythemia: Secondary | ICD-10-CM

## 2020-04-13 MED ORDER — ERGOCALCIFEROL 1.25 MG (50000 UT) PO CAPS: 50000.0000 [IU] | ORAL_CAPSULE | ORAL | 3 refills | Status: DC

## 2020-04-13 MED FILL — VIT D2 1.25 MG (50,000 UNIT: 1.25 MG | 84 days supply | Qty: 12 | Fill #0

## 2020-04-13 NOTE — Progress Notes (Signed)
[facility] Follow up:    [name] Helper, MD [address] [place]  [id number]   DIAGNOSIS: Thrombocytosis  CURRENT THERAPY: Observation  INTERVAL HISTORY: [name] [name] 59 y.o. female was called for a telephone visit for thrombocytosis.  Patient reports she is doing well since her last visit.  She has no complaints at this time. Denies any nausea, vomiting, or diarrhea. Denies any new pains. Had not noticed any recent bleeding such as epistaxis, hematuria or hematochezia. Denies recent chest pain on exertion, shortness of breath on minimal exertion, pre-syncopal episodes, or palpitations. Denies any numbness or tingling in hands or feet. Denies any recent fevers, infections, or recent hospitalizations. Patient reports appetite at 75% and energy level at 75%.  She is eating well maintain her weight at this time.    Patient Active Problem List   Diagnosis Date Noted  . 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 Tubular adenoma of colon 07/08/2018  . Thrombocytosis ([place]) 06/25/2017  . Annual physical exam 05/17/2015  . Metabolic syndrome X 84/13/2440  . Nicotine dependence 06/30/2013  . Vitamin D deficiency 08/12/2012  . Prediabetes 04/08/2012  . Allergic rhinitis 01/21/2008  . Malignant neoplasm of breast (female) ([place]) 01/17/2008  . Hyperlipemia 01/17/2008  . Morbid obesity ([place]) 01/17/2008  . Essential hypertension 01/17/2008    is allergic to ace inhibitors.  MEDICAL HISTORY: Past Medical History:  Diagnosis Date  . Adenocarcinoma of breast ([place])    left   . Heel spur   . Hyperlipidemia   . Hypertension   . Migraines   . Obesity     SURGICAL HISTORY: Past Surgical History:  Procedure Laterality Date  . BREAST SURGERY    . btl  1992  . CHOLECYSTECTOMY    . COLONOSCOPY  09/15/2011   Procedure: COLONOSCOPY;  Surgeon: Dorothyann Peng, MD;  [facility];  Service: Endoscopy;  Laterality: N/A;  9:15 AM  .  COLONOSCOPY N/A 08/16/2018   Procedure: COLONOSCOPY;  Surgeon: Danie Binder, MD;  Location: [facility];  Service: Endoscopy;  Laterality: N/A;  3:00  . left lumpectomy and lymph node disection  2000  . POLYPECTOMY  08/16/2018   Procedure: POLYPECTOMY;  Surgeon: Danie Binder, MD;  Location: [facility];  Service: Endoscopy;;  ascending colon(CSx3), descending colon(CSx1)  . r heel surgery for spur and chipped bone  11/09/20099  . TUBAL LIGATION      SOCIAL HISTORY: Social History   Socioeconomic History  . Marital status: Married    Spouse name: Not on file  . Number of children: Not on file  . Years of education: Not on file  . Highest education level: Not on file  Occupational History  . Not on file  Tobacco Use  . Smoking status: Current Every Day Smoker    Packs/day: 0.25    Years: 15.00    Pack years: 3.75    Types: Cigarettes  . Smokeless tobacco: Never Used  . Tobacco comment: smoking 3 per day  Vaping Use  . Vaping Use: Never used  Substance and Sexual Activity  . Alcohol use: No    Alcohol/week: 0.0 standard drinks  . Drug use: No    Comment: pt states "3 cigarrettes per day"  . Sexual activity: Yes    Birth control/protection: Post-menopausal  Other Topics Concern  . Not on file  Social History Narrative  . Not on file   Social Determinants of Health  Financial Resource Strain:   . Difficulty of Paying Living Expenses:   Food Insecurity:   . Worried About Charity fundraiser in the Last Year:   . Arboriculturist in the Last Year:   Transportation Needs:   . Film/video editor (Medical):   Marland Kitchen Lack of Transportation (Non-Medical):   Physical Activity:   . Days of Exercise per Week:   . Minutes of Exercise per Session:   Stress:   . Feeling of Stress :   Social Connections:   . Frequency of Communication with Friends and Family:   . Frequency of Social Gatherings with Friends and Family:   . Attends Religious Services:   . Active  Member of Clubs or Organizations:   . Attends Archivist Meetings:   Marland Kitchen Marital Status:   Intimate Partner Violence:   . Fear of Current or Ex-Partner:   . Emotionally Abused:   Marland Kitchen Physically Abused:   . Sexually Abused:     FAMILY HISTORY: Family History  Problem Relation Age of Onset  . Heart attack Mother   . Heart attack Father   . Glaucoma Father   . Hypertension Father   . Diabetes Sister   . Anesthesia problems Neg Hx   . Hypotension Neg Hx   . Malignant hyperthermia Neg Hx   . Pseudochol deficiency Neg Hx   . Migraines Neg Hx     Review of Systems  All other systems reviewed and are negative.    Vital signs: -Deferred to telephone visit  Physical Exam -Deferred due to telephone visit -Patient was alert and oriented over the phone in no acute distress   LABORATORY DATA:  CBC    Component Value Date/Time   WBC 10.4 04/05/2020 1408   RBC 4.69 04/05/2020 1408   HGB 14.1 04/05/2020 1408   HGB 13.7 04/09/2017 1445   HCT 43.4 04/05/2020 1408   HCT 39.9 04/09/2017 1445   PLT 479 (H) 04/05/2020 1408   PLT 478 (H) 04/09/2017 1445   MCV 92.5 04/05/2020 1408   MCV 88 04/09/2017 1445   MCH 30.1 04/05/2020 1408   MCHC 32.5 04/05/2020 1408   RDW 14.0 04/05/2020 1408   RDW 14.9 04/09/2017 1445   LYMPHSABS 4.9 (H) 04/05/2020 1408   LYMPHSABS 4.5 (H) 04/09/2017 1445   MONOABS 0.8 04/05/2020 1408   EOSABS 0.3 04/05/2020 1408   EOSABS 0.2 04/09/2017 1445   BASOSABS 0.1 04/05/2020 1408   BASOSABS 0.1 04/09/2017 1445    CMP     Component Value Date/Time   NA 144 04/05/2020 1408   NA 144 04/09/2017 1445   K 3.6 04/05/2020 1408   CL 106 04/05/2020 1408   CO2 27 04/05/2020 1408   GLUCOSE 83 04/05/2020 1408   BUN 18 04/05/2020 1408   BUN 11 04/09/2017 1445   CREATININE 0.84 04/05/2020 1408   CREATININE 0.63 04/07/2019 1103   CALCIUM 9.9 04/05/2020 1408   PROT 7.6 04/05/2020 1408   PROT 7.5 04/09/2017 1445   ALBUMIN 4.2 04/05/2020 1408   ALBUMIN  4.6 04/09/2017 1445   AST 16 04/05/2020 1408   ALT 16 04/05/2020 1408   ALKPHOS 68 04/05/2020 1408   BILITOT 0.4 04/05/2020 1408   BILITOT <0.2 04/09/2017 1445   GFRNONAA >60 04/05/2020 1408   GFRNONAA 100 04/07/2019 1103   GFRAA >60 04/05/2020 1408   GFRAA 115 04/07/2019 1103   All questions were answered to patient's stated satisfaction. Encouraged patient to call with any  new concerns or questions before his next visit to the cancer center and we can certain see him sooner, if needed.      ASSESSMENT and THERAPY PLAN:   Thrombocytosis ([place]) 1.  Thrombocytosis: - Myeloproliferative work-up negative for Jak 2/exon 12/CALR/MPL. -Denies any vasomotor symptoms like erythromelalgia or aquagenic pruritus. - Labs today on 04/05/2020 showed her hemoglobin 14.1, hematocrit 43.4, platelets 479, ferritin 72, percent saturation 15 - Her platelet count over the last several years is close to 450.  If there is any significant change in her platelet count we will consider bone marrow aspiration and biopsy. - She will follow-up in 6 months with repeat labs.  2.  Left breast cancer: -Diagnosed in 2006, status post lumpectomy. -Reportedly underwent chemotherapy followed by radiation.  She did not receive antiestrogen therapy. -Mammogram dated 05/19/2019 shows B RADS category 1 negative. -Her mammogram is set up for August 2021  3.  Hypokalemia: -She is on 20 mEq of potassium daily. - Her potassium level on 04/05/2020 showed potassium 3.6   Orders Placed This Encounter  Procedures  . Lactate dehydrogenase    Standing Status:   Future    Standing Expiration Date:   04/13/2021  . CBC with Differential/Platelet    Standing Status:   Future    Standing Expiration Date:   04/13/2021  . Comprehensive metabolic panel    Standing Status:   Future    Standing Expiration Date:   04/13/2021  . Ferritin    Standing Status:   Future    Standing Expiration Date:   04/13/2021  . Iron and TIBC    Standing  Status:   Future    Standing Expiration Date:   04/13/2021  . Vitamin B12    Standing Status:   Future    Standing Expiration Date:   04/13/2021  . VITAMIN D 25 Hydroxy (Vit-D Deficiency, Fractures)    Standing Status:   Future    Standing Expiration Date:   04/13/2021    All questions were answered. The patient knows to call the clinic with any problems, questions or concerns. We can certainly see the patient much sooner if necessary. This note was electronically signed.  I provided 29 minutes of non face-to-face telephone visit time during this encounter, and > 50% was spent counseling as documented under my assessment & plan.   Glennie Isle, NP-C 04/13/2020

## 2020-04-13 NOTE — Assessment & Plan Note (Signed)
1.  Thrombocytosis: - Myeloproliferative work-up negative for Jak 2/exon 12/CALR/MPL. -Denies any vasomotor symptoms like erythromelalgia or aquagenic pruritus. - Labs today on 04/05/2020 showed her hemoglobin 14.1, hematocrit 43.4, platelets 479, ferritin 72, percent saturation 15 - Her platelet count over the last several years is close to 450.  If there is any significant change in her platelet count we will consider bone marrow aspiration and biopsy. - She will follow-up in 6 months with repeat labs.  2.  Left breast cancer: -Diagnosed in 2006, status post lumpectomy. -Reportedly underwent chemotherapy followed by radiation.  She did not receive antiestrogen therapy. -Mammogram dated 05/19/2019 shows B RADS category 1 negative. -Her mammogram is set up for August 2021  3.  Hypokalemia: -She is on 20 mEq of potassium daily. - Her potassium level on 04/05/2020 showed potassium 3.6

## 2020-04-19 ENCOUNTER — Other Ambulatory Visit: Payer: Self-pay | Admitting: Family Medicine

## 2020-04-19 ENCOUNTER — Other Ambulatory Visit: Payer: Self-pay

## 2020-04-19 ENCOUNTER — Ambulatory Visit (HOSPITAL_COMMUNITY)
Admission: RE | Admit: 2020-04-19 | Discharge: 2020-04-19 | Disposition: A | Discharge status: Home / Self Care | Payer: No Typology Code available for payment source | Source: Ambulatory Visit | Attending: Family Medicine | Admitting: Family Medicine

## 2020-04-19 ENCOUNTER — Ambulatory Visit (INDEPENDENT_AMBULATORY_CARE_PROVIDER_SITE_OTHER): Payer: No Typology Code available for payment source | Admitting: Family Medicine

## 2020-04-19 ENCOUNTER — Encounter: Payer: Self-pay | Admitting: Family Medicine

## 2020-04-19 VITALS — BP 119/80 | HR 87 | Temp 98.3°F | Resp 18 | Ht 62.0 in | Wt 201.4 lb

## 2020-04-19 DIAGNOSIS — I10 Essential (primary) hypertension: Secondary | ICD-10-CM

## 2020-04-19 DIAGNOSIS — Z1231 Encounter for screening mammogram for malignant neoplasm of breast: Secondary | ICD-10-CM

## 2020-04-19 DIAGNOSIS — E7849 Other hyperlipidemia: Secondary | ICD-10-CM

## 2020-04-19 DIAGNOSIS — M542 Cervicalgia: Secondary | ICD-10-CM | POA: Diagnosis not present

## 2020-04-19 DIAGNOSIS — R7303 Prediabetes: Secondary | ICD-10-CM | POA: Diagnosis not present

## 2020-04-19 DIAGNOSIS — M25512 Pain in left shoulder: Secondary | ICD-10-CM | POA: Insufficient documentation

## 2020-04-19 DIAGNOSIS — G8929 Other chronic pain: Secondary | ICD-10-CM | POA: Insufficient documentation

## 2020-04-19 DIAGNOSIS — M25511 Pain in right shoulder: Secondary | ICD-10-CM

## 2020-04-19 LAB — POCT GLYCOSYLATED HEMOGLOBIN (HGB A1C)
HbA1c POC (<> result, manual entry): 5.8 % (ref 4.0–5.6)
HbA1c, POC (controlled diabetic range): 5.8 % (ref 0.0–7.0)
HbA1c, POC (prediabetic range): 5.8 % (ref 5.7–6.4)
Hemoglobin A1C: 5.8 % — AB (ref 4.0–5.6)

## 2020-04-19 MED ORDER — KETOROLAC TROMETHAMINE 60 MG/2ML IM SOLN
60.0000 mg | Freq: Once | INTRAMUSCULAR | Status: AC
  Administered 2020-04-19: 60 mg via INTRAMUSCULAR

## 2020-04-19 MED ORDER — METHYLPREDNISOLONE ACETATE 80 MG/ML IJ SUSP
80.0000 mg | Freq: Once | INTRAMUSCULAR | Status: AC
  Administered 2020-04-19: 80 mg via INTRAMUSCULAR

## 2020-04-19 MED ORDER — FAMOTIDINE 40 MG PO TABS: 40.0000 mg | ORAL_TABLET | Freq: Every day | ORAL | 0 refills | Status: DC

## 2020-04-19 MED ORDER — PREDNISONE 10 MG PO TABS: 10.0000 mg | ORAL_TABLET | Freq: Two times a day (BID) | ORAL | 0 refills | Status: DC

## 2020-04-19 MED ORDER — CYCLOBENZAPRINE HCL 10 MG PO TABS: 10.0000 mg | ORAL_TABLET | Freq: Every day | ORAL | 0 refills | Status: DC

## 2020-04-19 MED ORDER — IBUPROFEN 800 MG PO TABS: 800.0000 mg | ORAL_TABLET | Freq: Three times a day (TID) | ORAL | 0 refills | Status: DC

## 2020-04-19 MED FILL — predniSONE 10 MG TABS: 10 | 5 days supply | Qty: 10 | Fill #0

## 2020-04-19 MED FILL — IBUPROFEN 800 MG TABS: 800 | 7 days supply | Qty: 21 | Fill #0

## 2020-04-19 MED FILL — FAMOTIDINE 40 MG TABLET: 40 | 14 days supply | Qty: 14 | Fill #0

## 2020-04-19 MED FILL — CYCLOBENZAPRINE HCL 10 MG T: 10 | 10 days supply | Qty: 10 | Fill #0

## 2020-04-19 NOTE — Assessment & Plan Note (Addendum)
6 month h/o progressive pain in both shoulders worse at night a 10, in the day a 5, disturbs sleep Uncontrolled.Toradol and depo medrol administered IM in the office , to be followed by a short course of oral prednisone and NSAIDS. dG C spine, may add bedtime gabapentin

## 2020-04-19 NOTE — Assessment & Plan Note (Addendum)
Patient educated about the importance of limiting  Carbohydrate intake , the need to commit to daily physical activity for a minimum of 30 minutes , and to commit weight loss. The fact that changes in all these areas will reduce or eliminate all together the development of diabetes is stressed.  Improved Diabetic Labs Latest Ref Rng & Units 04/05/2020 10/06/2019 04/07/2019 11/04/2018 07/01/2018  HbA1c 4.8 - 5.6 % - 5.9(H) 5.9(H) 6.1(H) 5.9(H)  Chol 0 - 200 mg/dL - 212(H) - 193 219(H)  HDL >40 mg/dL - 51 - 54 57  Calc LDL 0 - 99 mg/dL - 140(H) - 122(H) 140(H)  Triglycerides <150 mg/dL - 107 - 78 106  Creatinine 0.44 - 1.00 mg/dL 0.84 0.56 0.63 0.62 0.53   BP/Weight 04/19/2020 10/20/2019 10/13/2019 06/16/2019 04/14/2019 04/14/2019 11/13/4710  Systolic BP 527 129 290 903 014 996 924  Diastolic BP 80 86 83 88 88 94 85  Wt. (Lbs) 201.4 194 198.1 199.12 198 199.04 -  BMI 36.84 35.48 36.23 36.42 36.21 36.4 -  Some encounter information is confidential and restricted. Go to Review Flowsheets activity to see all data.   No flowsheet data found.

## 2020-04-19 NOTE — Assessment & Plan Note (Signed)
  Patient re-educated about  the importance of commitment to a  minimum of 150 minutes of exercise per week as able.  The importance of healthy food choices with portion control discussed, as well as eating regularly and within a 12 hour window most days. The need to choose "clean , green" food 50 to 75% of the time is discussed, as well as to make water the primary drink and set a goal of 64 ounces water daily.    Weight /BMI 04/19/2020 10/20/2019 10/13/2019  WEIGHT 201 lb 6.4 oz 194 lb 198 lb 1.6 oz  HEIGHT 5\' 2"  5\' 2"  -  BMI 36.84 kg/m2 35.48 kg/m2 36.23 kg/m2  Some encounter information is confidential and restricted. Go to Review Flowsheets activity to see all data.   Diabetes linked with HTN , pre diabetes and arthritis

## 2020-04-19 NOTE — Assessment & Plan Note (Signed)
Hyperlipidemia:Low fat diet discussed and encouraged.   Lipid Panel  Lab Results  Component Value Date   CHOL 212 (H) 10/06/2019   HDL 51 10/06/2019   LDLCALC 140 (H) 10/06/2019   TRIG 107 10/06/2019   CHOLHDL 4.2 10/06/2019     Updated lab needed at/ before next visit.

## 2020-04-19 NOTE — Progress Notes (Signed)
[name] [name]     MRN: [id number]      DOB: 11/08/1960   HPI [name] [name] is here for follow up and re-evaluation of chronic medical conditions, medication management and review of any available recent lab and radiology data.  Preventive health is updated, specifically  Cancer screening and Immunization.   Questions or concerns regarding consultations or procedures which the PT has had in the interim are  addressed. The PT denies any adverse reactions to current medications since the last visit.   C/o bil;ateral shoulder and neck pain disabling ROS Denies recent fever or chills. Denies sinus pressure, nasal congestion, ear pain or sore throat. Denies chest congestion, productive cough or wheezing. Denies chest pains, palpitations and leg swelling Denies abdominal pain, nausea, vomiting,diarrhea or constipation.   Denies dysuria, frequency, hesitancy or incontinence.  Denies headaches, seizures, numbness, or tingling. Denies depression, anxiety or insomnia. Denies skin break down or rash.   PE  BP 119/80 (BP Location: Right Arm, Patient Position: Sitting, Cuff Size: Normal)   Pulse 87   Temp 98.3 F (36.8 C) (Temporal)   Resp 18   Ht 5\' 2"  (1.575 m)   Wt 201 lb 6.4 oz (91.4 kg)   SpO2 95%   BMI 36.84 kg/m   Patient alert and oriented and in no cardiopulmonary distress.  HEENT: No facial asymmetry, EOMI,     Neck decreased rOM  Chest: Clear to auscultation bilaterally.  CVS: S1, S2 no murmurs, no S3.Regular rate.  ABD: Soft non tender.   Ext: No edema  MS: Adequate ROM spine, shoulders, hips and knees.  Skin: Intact, no ulcerations or rash noted.  Psych: Good eye contact, normal affect. Memory intact not anxious or depressed appearing.  CNS: CN 2-12 intact, power,  normal throughout.no focal deficits noted.   Assessment & Plan  Essential hypertension Controlled, no change in medication DASH diet and commitment to daily physical activity for a minimum of 30  minutes discussed and encouraged, as a part of hypertension management. The importance of attaining a healthy weight is also discussed.  BP/Weight 04/19/2020 10/20/2019 10/13/2019 06/16/2019 04/14/2019 04/14/2019 6/56/8127  Systolic BP 517 001 749 449 675 916 384  Diastolic BP 80 86 83 88 88 94 85  Wt. (Lbs) 201.4 194 198.1 199.12 198 199.04 -  BMI 36.84 35.48 36.23 36.42 36.21 36.4 -  Some encounter information is confidential and restricted. Go to Review Flowsheets activity to see all data.       Prediabetes Patient educated about the importance of limiting  Carbohydrate intake , the need to commit to daily physical activity for a minimum of 30 minutes , and to commit weight loss. The fact that changes in all these areas will reduce or eliminate all together the development of diabetes is stressed.  Improved Diabetic Labs Latest Ref Rng & Units 04/05/2020 10/06/2019 04/07/2019 11/04/2018 07/01/2018  HbA1c 4.8 - 5.6 % - 5.9(H) 5.9(H) 6.1(H) 5.9(H)  Chol 0 - 200 mg/dL - 212(H) - 193 219(H)  HDL >40 mg/dL - 51 - 54 57  Calc LDL 0 - 99 mg/dL - 140(H) - 122(H) 140(H)  Triglycerides <150 mg/dL - 107 - 78 106  Creatinine 0.44 - 1.00 mg/dL 0.84 0.56 0.63 0.62 0.53   BP/Weight 04/19/2020 10/20/2019 10/13/2019 06/16/2019 04/14/2019 04/14/2019 6/65/9935  Systolic BP 701 779 390 300 923 300 762  Diastolic BP 80 86 83 88 88 94 85  Wt. (Lbs) 201.4 194 198.1 199.12 198 199.04 -  BMI 36.84  35.48 36.23 36.42 36.21 36.4 -  Some encounter information is confidential and restricted. Go to Review Flowsheets activity to see all data.   No flowsheet data found.    Morbid obesity ([place])  Patient re-educated about  the importance of commitment to a  minimum of 150 minutes of exercise per week as able.  The importance of healthy food choices with portion control discussed, as well as eating regularly and within a 12 hour window most days. The need to choose "clean , green" food 50 to 75% of the time is discussed, as  well as to make water the primary drink and set a goal of 64 ounces water daily.    Weight /BMI 04/19/2020 10/20/2019 10/13/2019  WEIGHT 201 lb 6.4 oz 194 lb 198 lb 1.6 oz  HEIGHT 5\' 2"  5\' 2"  -  BMI 36.84 kg/m2 35.48 kg/m2 36.23 kg/m2  Some encounter information is confidential and restricted. Go to Review Flowsheets activity to see all data.   Diabetes linked with HTN , pre diabetes and arthritis   Chronic neck pain 6 month h/o progressive pain in both shoulders worse at night a 10, in the day a 5, disturbs sleep Uncontrolled.Toradol and depo medrol administered IM in the office , to be followed by a short course of oral prednisone and NSAIDS. dG C spine, may add bedtime gabapentin  Hyperlipemia Hyperlipidemia:Low fat diet discussed and encouraged.   Lipid Panel  Lab Results  Component Value Date   CHOL 212 (H) 10/06/2019   HDL 51 10/06/2019   LDLCALC 140 (H) 10/06/2019   TRIG 107 10/06/2019   CHOLHDL 4.2 10/06/2019     Updated lab needed at/ before next visit.   Bilateral shoulder pain Full ROM of shoulder joints, clinically , I believe this is referred from her neck, will follow up

## 2020-04-19 NOTE — Patient Instructions (Addendum)
Annual physical exam in office with MD in 3-1/2 months call if you need me sooner.  Mammogram to be scheduled at checkout due August 25 or after.  Congratulations on improved blood sugar continue this trend so that you do not become diabetic.  Toradol 60 mg and Depo-Medrol 80 mg IM are given in the office today for bilateral shoulder pain.  I do believe the pain is from your neck.  Please get x-ray of the neck today after you leave.  Ibuprofen prednisone and Pepcid are prescribed short-term as well as a muscle relaxant Flexeril.  It is likely that I will prescribe gabapentin long-term for bedtime use but will let you know after I review the x-ray report.   It is important that you exercise regularly at least 30 minutes 5 times a week. If you develop chest pain, have severe difficulty breathing, or feel very tired, stop exercising immediately and seek medical attention  Think about what you will eat, plan ahead. Choose " clean, green, fresh or frozen" over canned, processed or packaged foods which are more sugary, salty and fatty. 70 to 75% of food eaten should be vegetables and fruit. Three meals at set times with snacks allowed between meals, but they must be fruit or vegetables. Aim to eat over a 12 hour period , example 7 am to 7 pm, and STOP after  your last meal of the day. Drink water,generally about 64 ounces per day, no other drink is as healthy. Fruit juice is best enjoyed in a healthy way, by EATING the fruit. Aim for 4 pound weight loss  Thanks for choosing [facility], we consider it a privelige to serve you.

## 2020-04-19 NOTE — Assessment & Plan Note (Signed)
Controlled, no change in medication DASH diet and commitment to daily physical activity for a minimum of 30 minutes discussed and encouraged, as a part of hypertension management. The importance of attaining a healthy weight is also discussed.  BP/Weight 04/19/2020 10/20/2019 10/13/2019 06/16/2019 04/14/2019 04/14/2019 8/87/1959  Systolic BP 747 185 501 586 825 749 355  Diastolic BP 80 86 83 88 88 94 85  Wt. (Lbs) 201.4 194 198.1 199.12 198 199.04 -  BMI 36.84 35.48 36.23 36.42 36.21 36.4 -  Some encounter information is confidential and restricted. Go to Review Flowsheets activity to see all data.

## 2020-04-19 NOTE — Assessment & Plan Note (Signed)
Full ROM of shoulder joints, clinically , I believe this is referred from her neck, will follow up

## 2020-05-03 MED FILL — POTASSIUM CHLORIDE CRYS ER: 20 | 90 days supply | Qty: 90 | Fill #1

## 2020-05-03 MED FILL — AMLODIPINE 2.5 MG TABLET: 2.5 | 90 days supply | Qty: 90 | Fill #1

## 2020-06-08 ENCOUNTER — Other Ambulatory Visit (HOSPITAL_COMMUNITY): Payer: Self-pay | Admitting: Family Medicine

## 2020-06-08 DIAGNOSIS — Z1231 Encounter for screening mammogram for malignant neoplasm of breast: Secondary | ICD-10-CM

## 2020-06-14 ENCOUNTER — Ambulatory Visit (HOSPITAL_COMMUNITY)
Admission: RE | Admit: 2020-06-14 | Discharge: 2020-06-14 | Disposition: A | Discharge status: Home / Self Care | Payer: No Typology Code available for payment source | Source: Ambulatory Visit | Attending: Family Medicine | Admitting: Family Medicine

## 2020-06-14 ENCOUNTER — Other Ambulatory Visit: Payer: Self-pay

## 2020-06-14 DIAGNOSIS — Z1231 Encounter for screening mammogram for malignant neoplasm of breast: Secondary | ICD-10-CM | POA: Diagnosis present

## 2020-06-24 MED FILL — HYDROCHLOROTHIAZIDE 25 MG T: 25 | 90 days supply | Qty: 90 | Fill #1

## 2020-06-24 MED FILL — VIT D2 1.25 MG (50,000 UNIT: 1.25 MG | 84 days supply | Qty: 12 | Fill #1

## 2020-07-01 ENCOUNTER — Other Ambulatory Visit: Payer: Self-pay | Admitting: Family Medicine

## 2020-07-05 ENCOUNTER — Other Ambulatory Visit: Payer: Self-pay | Admitting: Family Medicine

## 2020-07-05 MED FILL — IBUPROFEN 800 MG TABS: 800 | 7 days supply | Qty: 21 | Fill #0

## 2020-07-29 ENCOUNTER — Other Ambulatory Visit: Payer: Self-pay | Admitting: Family Medicine

## 2020-07-29 DIAGNOSIS — I10 Essential (primary) hypertension: Secondary | ICD-10-CM

## 2020-07-29 MED FILL — AMLODIPINE 2.5 MG TABLET: 2.5 | 90 days supply | Qty: 90 | Fill #0

## 2020-07-29 MED FILL — POTASSIUM CHLORIDE CRYS ER: 20 | 90 days supply | Qty: 90 | Fill #0

## 2020-08-03 ENCOUNTER — Ambulatory Visit: Payer: No Typology Code available for payment source | Admitting: Family Medicine

## 2020-08-09 ENCOUNTER — Encounter: Payer: Self-pay | Admitting: Family Medicine

## 2020-08-09 ENCOUNTER — Ambulatory Visit (INDEPENDENT_AMBULATORY_CARE_PROVIDER_SITE_OTHER): Payer: No Typology Code available for payment source | Admitting: Family Medicine

## 2020-08-09 ENCOUNTER — Other Ambulatory Visit: Payer: Self-pay

## 2020-08-09 VITALS — BP 123/80 | HR 87 | Resp 16 | Ht 62.0 in | Wt 196.0 lb

## 2020-08-09 DIAGNOSIS — E8881 Metabolic syndrome: Secondary | ICD-10-CM

## 2020-08-09 DIAGNOSIS — I10 Essential (primary) hypertension: Secondary | ICD-10-CM | POA: Diagnosis not present

## 2020-08-09 DIAGNOSIS — E7849 Other hyperlipidemia: Secondary | ICD-10-CM

## 2020-08-09 DIAGNOSIS — R7303 Prediabetes: Secondary | ICD-10-CM

## 2020-08-09 DIAGNOSIS — Z0000 Encounter for general adult medical examination without abnormal findings: Secondary | ICD-10-CM

## 2020-08-09 DIAGNOSIS — F17218 Nicotine dependence, cigarettes, with other nicotine-induced disorders: Secondary | ICD-10-CM

## 2020-08-09 DIAGNOSIS — H547 Unspecified visual loss: Secondary | ICD-10-CM | POA: Insufficient documentation

## 2020-08-09 DIAGNOSIS — E559 Vitamin D deficiency, unspecified: Secondary | ICD-10-CM

## 2020-08-09 NOTE — Assessment & Plan Note (Signed)
Refer opthalmology

## 2020-08-09 NOTE — Progress Notes (Signed)
a 

## 2020-08-09 NOTE — Patient Instructions (Signed)
Follow-up in office with MD in 5 months call if you need me sooner.  You are referred for vision testing in [place] please keep appointment as you have reduced vision.  Congratulations on weight loss which is due is due in part to change in your diet as well as exercising more regularly please continue to work on both of these.  You  Smoke 2 to 2.5 cigarettes daily.Call, free of charge [phone] quit now  for help with quitting., quit date is Sep 25, 2020  Fasting labs second week in January CBC lipids CMP and EGFR TSH vitamin D and hemoglobin A1c  Call for shingrix vaccine once you decide  Thanks for choosing [facility], we consider it a privelige to serve you.

## 2020-08-09 NOTE — Assessment & Plan Note (Signed)

## 2020-08-12 ENCOUNTER — Encounter: Payer: Self-pay | Admitting: Family Medicine

## 2020-08-12 NOTE — Progress Notes (Signed)
    [name] [name]     MRN: [id number]      DOB: 10-Mar-1961  HPI: Patient is in for annual physical exam. No other health concerns are expressed or addressed at the visit. Recent labs,  are reviewed. Immunization is reviewed , and  updated if needed.   PE: BP 123/80   Pulse 87   Resp 16   Ht 5\' 2"  (1.575 m)   Wt 196 lb (88.9 kg)   SpO2 97%   BMI 35.85 kg/m   Pleasant  female, alert and oriented x 3, in no cardio-pulmonary distress. Afebrile. HEENT No facial trauma or asymetry. Sinuses non tender.  Extra occullar muscles intact.. External ears normal, . Neck: supple, no adenopathy,JVD or thyromegaly.No bruits.  Chest: Clear to ascultation bilaterally.No crackles or wheezes. Non tender to palpation  Breast: No asymetry,no masses or lumps. No tenderness. No nipple discharge or inversion. No axillary or supraclavicular adenopathy  Cardiovascular system; Heart sounds normal,  S1 and  S2 ,no S3.  No murmur, or thrill. Apical beat not displaced Peripheral pulses normal.  Abdomen: Soft, non tender, no organomegaly or masses. No bruits. Bowel sounds normal. No guarding, tenderness or rebound.     Musculoskeletal exam: Full ROM of spine, hips , shoulders and knees. No deformity ,swelling or crepitus noted. No muscle wasting or atrophy.   Neurologic: Cranial nerves 2 to 12 intact. Power, tone ,sensation and reflexes normal throughout. No disturbance in gait. No tremor.  Skin: Intact, no ulceration, erythema , scaling or rash noted. Pigmentation normal throughout  Psych; Normal mood and affect. Judgement and concentration normal   Assessment & Plan:  Annual physical exam Annual exam as documented. Counseling done  re healthy lifestyle involving commitment to 150 minutes exercise per week, heart healthy diet, and attaining healthy weight.The importance of adequate sleep also discussed. Regular seat belt use and home safety, is also discussed. Changes in  health habits are decided on by the patient with goals and time frames  set for achieving them. Immunization and cancer screening needs are specifically addressed at this visit.   Reduced vision Refer opthalmology  Nicotine dependence Asked:confirms currently smokes cigarettes 2 to 3 cigarettes/ day Assess: willing to set a quit date, and  is cutting back Quit date is 09/25/2020 Advise: needs to QUIT to reduce risk of cancer, cardio and cerebrovascular disease Assist: counseled for 5 minutes and literature provided Arrange: follow up in 2 to 4 months

## 2020-08-12 NOTE — Assessment & Plan Note (Signed)
Asked:confirms currently smokes cigarettes 2 to 3 cigarettes/ day Assess: willing to set a quit date, and  is cutting back Quit date is 09/25/2020 Advise: needs to QUIT to reduce risk of cancer, cardio and cerebrovascular disease Assist: counseled for 5 minutes and literature provided Arrange: follow up in 2 to 4 months

## 2020-09-06 ENCOUNTER — Other Ambulatory Visit: Payer: Self-pay | Admitting: Family Medicine

## 2020-09-06 MED FILL — IBUPROFEN 800 MG TABS: 800 | 7 days supply | Qty: 21 | Fill #0

## 2020-09-23 ENCOUNTER — Other Ambulatory Visit: Payer: Self-pay | Admitting: Family Medicine

## 2020-09-23 DIAGNOSIS — I10 Essential (primary) hypertension: Secondary | ICD-10-CM

## 2020-09-23 MED FILL — HYDROCHLOROTHIAZIDE 25 MG T: 25 | 90 days supply | Qty: 90 | Fill #0

## 2020-09-23 MED FILL — VIT D2 1.25 MG (50,000 UNIT: 1.25 MG | 84 days supply | Qty: 12 | Fill #2

## 2020-10-07 ENCOUNTER — Other Ambulatory Visit (HOSPITAL_COMMUNITY): Payer: Self-pay | Admitting: *Deleted

## 2020-10-07 DIAGNOSIS — D75839 Thrombocytosis, unspecified: Secondary | ICD-10-CM

## 2020-10-08 ENCOUNTER — Inpatient Hospital Stay (HOSPITAL_COMMUNITY): Payer: No Typology Code available for payment source | Attending: Hematology

## 2020-10-08 ENCOUNTER — Other Ambulatory Visit: Payer: Self-pay

## 2020-10-08 DIAGNOSIS — Z853 Personal history of malignant neoplasm of breast: Secondary | ICD-10-CM | POA: Insufficient documentation

## 2020-10-08 DIAGNOSIS — D75839 Thrombocytosis, unspecified: Secondary | ICD-10-CM | POA: Diagnosis not present

## 2020-10-08 DIAGNOSIS — E876 Hypokalemia: Secondary | ICD-10-CM | POA: Insufficient documentation

## 2020-10-08 LAB — COMPREHENSIVE METABOLIC PANEL
ALT: 16 U/L (ref 0–44)
AST: 18 U/L (ref 15–41)
Albumin: 4.1 g/dL (ref 3.5–5.0)
Alkaline Phosphatase: 61 U/L (ref 38–126)
Anion gap: 9 (ref 5–15)
BUN: 7 mg/dL (ref 6–20)
CO2: 26 mmol/L (ref 22–32)
Calcium: 9.5 mg/dL (ref 8.9–10.3)
Chloride: 103 mmol/L (ref 98–111)
Creatinine, Ser: 0.58 mg/dL (ref 0.44–1.00)
GFR, Estimated: >60 mL/min (ref >60)
Glucose, Bld: 96 mg/dL (ref 70–99)
Potassium: 3.7 mmol/L (ref 3.5–5.1)
Sodium: 138 mmol/L (ref 135–145)
Total Bilirubin: 0.8 mg/dL (ref 0.3–1.2)
Total Protein: 7.7 g/dL (ref 6.5–8.1)

## 2020-10-08 LAB — CBC WITH DIFFERENTIAL/PLATELET
Abs Immature Granulocytes: 0.01 10*3/uL (ref 0.00–0.07)
Basophils Absolute: 0 10*3/uL (ref 0.0–0.1)
Basophils Relative: 1 %
Eosinophils Absolute: 0.2 10*3/uL (ref 0.0–0.5)
Eosinophils Relative: 4 %
HCT: 44.3 % (ref 36.0–46.0)
Hemoglobin: 14.4 g/dL (ref 12.0–15.0)
Immature Granulocytes: 0 %
Lymphocytes Relative: 42 %
Lymphs Abs: 2.9 10*3/uL (ref 0.7–4.0)
MCH: 30.3 pg (ref 26.0–34.0)
MCHC: 32.5 g/dL (ref 30.0–36.0)
MCV: 93.3 fL (ref 80.0–100.0)
Monocytes Absolute: 0.5 10*3/uL (ref 0.1–1.0)
Monocytes Relative: 8 %
Neutro Abs: 3.2 10*3/uL (ref 1.7–7.7)
Neutrophils Relative %: 45 %
Platelets: 478 10*3/uL — ABNORMAL HIGH (ref 150–400)
RBC: 4.75 MIL/uL (ref 3.87–5.11)
RDW: 14.2 % (ref 11.5–15.5)
WBC: 6.9 10*3/uL (ref 4.0–10.5)
nRBC: 0 % (ref 0.0–0.2)

## 2020-10-08 LAB — VITAMIN B12: Vitamin B-12: 441 pg/mL (ref 180–914)

## 2020-10-08 LAB — IRON AND TIBC
Iron: 90 ug/dL (ref 28–170)
Saturation Ratios: 23 % (ref 10.4–31.8)
TIBC: 399 ug/dL (ref 250–450)
UIBC: 309 ug/dL

## 2020-10-08 LAB — VITAMIN D 25 HYDROXY (VIT D DEFICIENCY, FRACTURES): Vit D, 25-Hydroxy: 71.92 ng/mL (ref 30–100)

## 2020-10-08 LAB — LACTATE DEHYDROGENASE: LDH: 150 U/L (ref 98–192)

## 2020-10-08 LAB — FERRITIN: Ferritin: 99 ng/mL (ref 11–307)

## 2020-10-11 ENCOUNTER — Other Ambulatory Visit (HOSPITAL_COMMUNITY): Payer: No Typology Code available for payment source

## 2020-10-12 ENCOUNTER — Inpatient Hospital Stay (HOSPITAL_BASED_OUTPATIENT_CLINIC_OR_DEPARTMENT_OTHER): Payer: No Typology Code available for payment source | Admitting: Oncology

## 2020-10-12 ENCOUNTER — Other Ambulatory Visit: Payer: Self-pay

## 2020-10-12 DIAGNOSIS — D75839 Thrombocytosis, unspecified: Secondary | ICD-10-CM

## 2020-10-12 NOTE — Progress Notes (Signed)
[facility] Follow up:    [name] Helper, MD [address] [place] [address]   DIAGNOSIS: Thrombocytosis  CURRENT THERAPY: Observation  I connected with [name] [name] on 10/12/20 at  9:10 AM EST by telephone visit and verified that I am speaking with the correct person using two identifiers.   I discussed the limitations, risks, security and privacy concerns of performing an evaluation and management service by telemedicine and the availability of in-person appointments. I also discussed with the patient that there may be a patient responsible charge related to this service. The patient expressed understanding and agreed to proceed.   Other persons participating in the visit and their role in the encounter: None   Patient's location: Home Provider's location: Home   INTERVAL HISTORY: [name] [name] 60 y.o. female was called for a telephone visit for thrombocytosis.  She was last seen in clinic on 04/13/2020.  In the interim she has been doing well.  She is followed closely by her primary care provider Dr. Moshe [name].  She denies any new concerns. Denies any nausea, vomiting, or diarrhea. Denies any new pains. Had not noticed any recent bleeding such as epistaxis, hematuria or hematochezia. Denies recent chest pain on exertion, shortness of breath on minimal exertion, pre-syncopal episodes, or palpitations. Denies any numbness or tingling in hands or feet. Denies any recent fevers, infections, or recent hospitalizations. Patient reports appetite at 75% and energy level at 75%.  She is eating well maintain her weight at this time.   Patient Active Problem List   Diagnosis Date Noted  . Reduced vision 08/09/2020  . Bilateral shoulder pain 04/19/2020  . 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 Tubular adenoma of colon 07/08/2018  . Thrombocytosis 06/25/2017  . Annual physical exam 05/17/2015  . Metabolic syndrome X 69/48/5462  .  Nicotine dependence 06/30/2013  . Vitamin D deficiency 08/12/2012  . Prediabetes 04/08/2012  . Allergic rhinitis 01/21/2008  . Malignant neoplasm of breast (female) ([place]) 01/17/2008  . Hyperlipemia 01/17/2008  . Morbid obesity ([place]) 01/17/2008  . Essential hypertension 01/17/2008    is allergic to ace inhibitors.  MEDICAL HISTORY: Past Medical History:  Diagnosis Date  . Adenocarcinoma of breast ([place])    left   . Chronic neck pain 02/10/2016  . Heel spur   . Hyperlipidemia   . Hypertension   . Migraines   . Obesity     SURGICAL HISTORY: Past Surgical History:  Procedure Laterality Date  . BREAST SURGERY    . btl  1992  . CHOLECYSTECTOMY    . COLONOSCOPY  09/15/2011   Procedure: COLONOSCOPY;  Surgeon: Dorothyann Peng, MD;  [facility]: [facility];  Service: Endoscopy;  Laterality: N/A;  9:15 AM  . COLONOSCOPY N/A 08/16/2018   Procedure: COLONOSCOPY;  Surgeon: Danie Binder, MD;  Location: [facility];  Service: Endoscopy;  Laterality: N/A;  3:00  . left lumpectomy and lymph node disection  2000  . POLYPECTOMY  08/16/2018   Procedure: POLYPECTOMY;  Surgeon: Danie Binder, MD;  Location: [facility];  Service: Endoscopy;;  ascending colon(CSx3), descending colon(CSx1)  . r heel surgery for spur and chipped bone  11/09/20099  . TUBAL LIGATION      SOCIAL HISTORY: Social History   Socioeconomic History  . Marital status: Married    Spouse name: Not on file  . Number of children: Not on file  . Years of education: Not on file  .  Highest education level: Not on file  Occupational History  . Not on file  Tobacco Use  . Smoking status: Current Every Day Smoker    Packs/day: 0.25    Years: 15.00    Pack years: 3.75    Types: Cigarettes  . Smokeless tobacco: Never Used  . Tobacco comment: smoking 3 per day  Vaping Use  . Vaping Use: Never used  Substance and Sexual Activity  . Alcohol use: No    Alcohol/week: 0.0 standard drinks  . Drug use: No     Comment: pt states "3 cigarrettes per day"  . Sexual activity: Yes    Birth control/protection: Post-menopausal  Other Topics Concern  . Not on file  Social History Narrative  . Not on file   Social Determinants of Health   Financial Resource Strain: Not on file  Food Insecurity: Not on file  Transportation Needs: Not on file  Physical Activity: Not on file  Stress: Not on file  Social Connections: Not on file  Intimate Partner Violence: Not on file    FAMILY HISTORY: Family History  Problem Relation Age of Onset  . Heart attack Mother   . Heart attack Father   . Glaucoma Father   . Hypertension Father   . Diabetes Sister   . Anesthesia problems Neg Hx   . Hypotension Neg Hx   . Malignant hyperthermia Neg Hx   . Pseudochol deficiency Neg Hx   . Migraines Neg Hx     Review of Systems  All other systems reviewed and are negative.    Vital signs: -Deferred to telephone visit  Physical Exam -Deferred due to telephone visit -Patient was alert and oriented over the phone in no acute distress   LABORATORY DATA:  CBC    Component Value Date/Time   WBC 6.9 10/08/2020 0820   RBC 4.75 10/08/2020 0820   HGB 14.4 10/08/2020 0820   HGB 13.7 04/09/2017 1445   HCT 44.3 10/08/2020 0820   HCT 39.9 04/09/2017 1445   PLT 478 (H) 10/08/2020 0820   PLT 478 (H) 04/09/2017 1445   MCV 93.3 10/08/2020 0820   MCV 88 04/09/2017 1445   MCH 30.3 10/08/2020 0820   MCHC 32.5 10/08/2020 0820   RDW 14.2 10/08/2020 0820   RDW 14.9 04/09/2017 1445   LYMPHSABS 2.9 10/08/2020 0820   LYMPHSABS 4.5 (H) 04/09/2017 1445   MONOABS 0.5 10/08/2020 0820   EOSABS 0.2 10/08/2020 0820   EOSABS 0.2 04/09/2017 1445   BASOSABS 0.0 10/08/2020 0820   BASOSABS 0.1 04/09/2017 1445    CMP     Component Value Date/Time   NA 138 10/08/2020 0820   NA 144 04/09/2017 1445   K 3.7 10/08/2020 0820   CL 103 10/08/2020 0820   CO2 26 10/08/2020 0820   GLUCOSE 96 10/08/2020 0820   BUN 7 10/08/2020  0820   BUN 11 04/09/2017 1445   CREATININE 0.58 10/08/2020 0820   CREATININE 0.63 04/07/2019 1103   CALCIUM 9.5 10/08/2020 0820   PROT 7.7 10/08/2020 0820   PROT 7.5 04/09/2017 1445   ALBUMIN 4.1 10/08/2020 0820   ALBUMIN 4.6 04/09/2017 1445   AST 18 10/08/2020 0820   ALT 16 10/08/2020 0820   ALKPHOS 61 10/08/2020 0820   BILITOT 0.8 10/08/2020 0820   BILITOT <0.2 04/09/2017 1445   GFRNONAA >60 10/08/2020 0820   GFRNONAA 100 04/07/2019 1103   GFRAA >60 04/05/2020 1408   GFRAA 115 04/07/2019 1103   All questions were answered  to patient's stated satisfaction. Encouraged patient to call with any new concerns or questions before his next visit to the cancer center and we can certain see him sooner, if needed.    ASSESSMENT and THERAPY PLAN:  Thrombocytosis ([place]) 1.  Thrombocytosis: - Myeloproliferative work-up negative for Jak 2/exon 12/CALR/MPL. -Denies any vasomotor symptoms like erythromelalgia or aquagenic pruritus. - Labs today on 10/08/20 showed her hemoglobin 14.4, hematocrit 44.3, platelets 478, ferritin 99, percent saturation 23. - Her platelet count over the last several years is close to 450.  If there is any significant change in her platelet count we will consider bone marrow aspiration and biopsy. - She will follow-up in 6 months with repeat labs.  2.  Left breast cancer: -Diagnosed in 2006, status post lumpectomy. -Reportedly underwent chemotherapy followed by radiation.  She did not receive antiestrogen therapy. -Mammogram dated 05/18/2020 shows B RADS category 1 negative. - Repeat mammogram in August 2022  3.  Hypokalemia: -She is on 20 mEq of potassium daily. - Her potassium level on 04/05/2020 showed potassium 3.7.  Disposition: -RTC in 6 months with repeat labs (CBC, CMP, iron panel, ferritin, vitamin D, vitamin B12 and LDH) and MD assessment.   No orders of the defined types were placed in this encounter.  All questions were answered. The patient knows to  call the clinic with any problems, questions or concerns. We can certainly see the patient much sooner if necessary. This note was electronically signed.  I provided 15 minutes of non face-to-face telephone visit time from during this encounter, and > 50% was spent counseling as documented under my assessment & plan.  Jacquelin Hawking, NP 10/12/2020

## 2020-10-27 MED FILL — POTASSIUM CHLORIDE CRYS ER: 20 | 90 days supply | Qty: 90 | Fill #1

## 2020-10-27 MED FILL — AMLODIPINE 2.5 MG TABLET: 2.5 | 90 days supply | Qty: 90 | Fill #1

## 2020-12-27 ENCOUNTER — Other Ambulatory Visit (HOSPITAL_COMMUNITY): Payer: Self-pay

## 2020-12-27 MED FILL — Hydrochlorothiazide Tab 25 MG: ORAL | 90 days supply | Qty: 90 | Fill #0 | Status: AC

## 2021-01-04 ENCOUNTER — Other Ambulatory Visit (HOSPITAL_COMMUNITY): Payer: Self-pay

## 2021-01-05 ENCOUNTER — Encounter: Payer: Self-pay | Admitting: Family Medicine

## 2021-01-05 ENCOUNTER — Telehealth (INDEPENDENT_AMBULATORY_CARE_PROVIDER_SITE_OTHER): Payer: No Typology Code available for payment source | Admitting: Family Medicine

## 2021-01-05 VITALS — Ht 62.0 in | Wt 186.0 lb

## 2021-01-05 DIAGNOSIS — I10 Essential (primary) hypertension: Secondary | ICD-10-CM | POA: Diagnosis not present

## 2021-01-05 DIAGNOSIS — Z1231 Encounter for screening mammogram for malignant neoplasm of breast: Secondary | ICD-10-CM | POA: Diagnosis not present

## 2021-01-05 DIAGNOSIS — E7849 Other hyperlipidemia: Secondary | ICD-10-CM | POA: Diagnosis not present

## 2021-01-05 DIAGNOSIS — R7301 Impaired fasting glucose: Secondary | ICD-10-CM

## 2021-01-05 NOTE — Progress Notes (Signed)
Virtual Visit via Telephone Note  I connected with [name] on 01/05/21 at  9:20 AM EDT by telephone and verified that I am speaking with the correct person using two identifiers.  Location: Patient: work Provider: office  I discussed the limitations, risks, security and privacy concerns of performing an evaluation and management service by telephone and the availability of in person appointments. I also discussed with the patient that there may be a patient responsible charge related to this service. The patient expressed understanding and agreed to proceed.   History of Present Illness: F/U chronic problems and address any new or current concerns. Review and update medications and allergies. Review recent lab and radiologic data . Update routine health maintainace. Review an encourage improved health habits to include nutrition, exercise and  sleep .  Denies recent fever or chills. Denies sinus pressure, nasal congestion, ear pain or sore throat. Denies chest congestion, productive cough or wheezing. Denies chest pains, palpitations and leg swelling Denies abdominal pain, nausea, vomiting,diarrhea or constipation.   Denies dysuria, frequency, hesitancy or incontinence. Denies joint pain, swelling and limitation in mobility. Denies headaches, seizures, numbness, or tingling. Denies depression, anxiety or insomnia. Denies skin break down or rash. Doing well no concerns, has lost weight and is more active      Observations/Objective: Ht 5\' 2"  (1.575 m)   Wt 186 lb (84.4 kg)   BMI 34.02 kg/m  Good communication with no confusion and intact memory. Alert and oriented x 3 No signs of respiratory distress during speech   Assessment and Plan: Essential hypertension Controlled, no change in medication DASH diet and commitment to daily physical activity for a minimum of 30 minutes discussed and encouraged, as a part of hypertension management. The importance of attaining a  healthy weight is also discussed.  BP/Weight 01/05/2021 08/09/2020 04/19/2020 10/20/2019 10/13/2019 06/16/2019 8/78/6767  Systolic BP - 209 470 962 836 629 476  Diastolic BP - 80 80 86 83 88 88  Wt. (Lbs) 186 196 201.4 194 198.1 199.12 198  BMI 34.02 35.85 36.84 35.48 36.23 36.42 36.21  Some encounter information is confidential and restricted. Go to Review Flowsheets activity to see all data.       Morbid obesity ([place])  Patient re-educated about  the importance of commitment to a  minimum of 150 minutes of exercise per week as able.  The importance of healthy food choices with portion control discussed, as well as eating regularly and within a 12 hour window most days. The need to choose "clean , green" food 50 to 75% of the time is discussed, as well as to make water the primary drink and set a goal of 64 ounces water daily.    Weight /BMI 01/05/2021 08/09/2020 04/19/2020  WEIGHT 186 lb 196 lb 201 lb 6.4 oz  HEIGHT 5\' 2"  5\' 2"  5\' 2"   BMI 34.02 kg/m2 35.85 kg/m2 36.84 kg/m2  Some encounter information is confidential and restricted. Go to Review Flowsheets activity to see all data.      Hyperlipemia Hyperlipidemia:Low fat diet discussed and encouraged.   Lipid Panel  Lab Results  Component Value Date   CHOL 212 (H) 10/06/2019   HDL 51 10/06/2019   LDLCALC 140 (H) 10/06/2019   TRIG 107 10/06/2019   CHOLHDL 4.2 10/06/2019   Needs to reduce fried and fatty foods     Follow Up Instructions:    I discussed the assessment and treatment plan with the patient. The patient was provided an opportunity  to ask questions and all were answered. The patient agreed with the plan and demonstrated an understanding of the instructions.   The patient was advised to call back or seek an in-person evaluation if the symptoms worsen or if the condition fails to improve as anticipated.  I provided 10 minutes of non-face-to-face time during this encounter.   Tula Nakayama, MD

## 2021-01-05 NOTE — Assessment & Plan Note (Signed)
Hyperlipidemia:Low fat diet discussed and encouraged.   Lipid Panel  Lab Results  Component Value Date   CHOL 212 (H) 10/06/2019   HDL 51 10/06/2019   LDLCALC 140 (H) 10/06/2019   TRIG 107 10/06/2019   CHOLHDL 4.2 10/06/2019   Needs to reduce fried and fatty foods

## 2021-01-05 NOTE — Patient Instructions (Addendum)
F/U in office with MD mid to end August , call if you need me before  Please schedule mammogram sept 21 or after , at checkout  Fasting lipid, hBA1C, TSH as soon as possible  It is important that you exercise regularly at least 30 minutes 5 times a week. If you develop chest pain, have severe difficulty breathing, or feel very tired, stop exercising immediately and seek medical attention  Think about what you will eat, plan ahead. Choose " clean, green, fresh or frozen" over canned, processed or packaged foods which are more sugary, salty and fatty. 70 to 75% of food eaten should be vegetables and fruit. Three meals at set times with snacks allowed between meals, but they must be fruit or vegetables. Aim to eat over a 12 hour period , example 7 am to 7 pm, and STOP after  your last meal of the day. Drink water,generally about 64 ounces per day, no other drink is as healthy. Fruit juice is best enjoyed in a healthy way, by EATING the fruit. Please get the covid booster

## 2021-01-05 NOTE — Assessment & Plan Note (Signed)
Controlled, no change in medication DASH diet and commitment to daily physical activity for a minimum of 30 minutes discussed and encouraged, as a part of hypertension management. The importance of attaining a healthy weight is also discussed.  BP/Weight 01/05/2021 08/09/2020 04/19/2020 10/20/2019 10/13/2019 06/16/2019 11/23/3141  Systolic BP - 888 757 972 820 601 561  Diastolic BP - 80 80 86 83 88 88  Wt. (Lbs) 186 196 201.4 194 198.1 199.12 198  BMI 34.02 35.85 36.84 35.48 36.23 36.42 36.21  Some encounter information is confidential and restricted. Go to Review Flowsheets activity to see all data.

## 2021-01-05 NOTE — Assessment & Plan Note (Signed)
  Patient re-educated about  the importance of commitment to a  minimum of 150 minutes of exercise per week as able.  The importance of healthy food choices with portion control discussed, as well as eating regularly and within a 12 hour window most days. The need to choose "clean , green" food 50 to 75% of the time is discussed, as well as to make water the primary drink and set a goal of 64 ounces water daily.    Weight /BMI 01/05/2021 08/09/2020 04/19/2020  WEIGHT 186 lb 196 lb 201 lb 6.4 oz  HEIGHT 5\' 2"  5\' 2"  5\' 2"   BMI 34.02 kg/m2 35.85 kg/m2 36.84 kg/m2  Some encounter information is confidential and restricted. Go to Review Flowsheets activity to see all data.

## 2021-01-10 ENCOUNTER — Ambulatory Visit: Payer: No Typology Code available for payment source | Admitting: Family Medicine

## 2021-01-11 LAB — HEMOGLOBIN A1C
Est. average glucose Bld gHb Est-mCnc: 128 mg/dL
Hgb A1c MFr Bld: 6.1 % — ABNORMAL HIGH (ref 4.8–5.6)

## 2021-01-11 LAB — LIPID PANEL
Chol/HDL Ratio: 3.4 ratio (ref 0.0–4.4)
Cholesterol, Total: 206 mg/dL — ABNORMAL HIGH (ref 100–199)
HDL: 60 mg/dL (ref >39)
LDL Chol Calc (NIH): 130 mg/dL — ABNORMAL HIGH (ref 0–99)
Triglycerides: 87 mg/dL (ref 0–149)
VLDL Cholesterol Cal: 16 mg/dL (ref 5–40)

## 2021-01-11 LAB — TSH: TSH: 1.52 u[IU]/mL (ref 0.450–4.500)

## 2021-01-24 ENCOUNTER — Other Ambulatory Visit (HOSPITAL_COMMUNITY): Payer: Self-pay

## 2021-01-24 ENCOUNTER — Other Ambulatory Visit: Payer: Self-pay | Admitting: Family Medicine

## 2021-01-24 DIAGNOSIS — I10 Essential (primary) hypertension: Secondary | ICD-10-CM

## 2021-01-24 MED ORDER — POTASSIUM CHLORIDE CRYS ER 20 MEQ PO TBCR
EXTENDED_RELEASE_TABLET | Freq: Every day | ORAL | 1 refills | Status: DC
  Filled 2021-01-24: qty 90, 90d supply, fill #0
  Filled 2021-05-01: qty 90, 90d supply, fill #1

## 2021-01-24 MED ORDER — AMLODIPINE BESYLATE 2.5 MG PO TABS
ORAL_TABLET | Freq: Every day | ORAL | 1 refills | Status: DC
  Filled 2021-01-24: qty 90, 90d supply, fill #0
  Filled 2021-05-01: qty 90, 90d supply, fill #1

## 2021-01-25 ENCOUNTER — Other Ambulatory Visit (HOSPITAL_COMMUNITY): Payer: Self-pay

## 2021-01-28 ENCOUNTER — Other Ambulatory Visit: Payer: Self-pay | Admitting: Family Medicine

## 2021-01-31 ENCOUNTER — Other Ambulatory Visit (HOSPITAL_COMMUNITY): Payer: Self-pay

## 2021-01-31 MED ORDER — IBUPROFEN 800 MG PO TABS
800.0000 mg | ORAL_TABLET | Freq: Three times a day (TID) | ORAL | 0 refills | Status: DC
  Filled 2021-01-31: qty 21, 7d supply, fill #0

## 2021-02-03 ENCOUNTER — Other Ambulatory Visit (HOSPITAL_COMMUNITY): Payer: Self-pay

## 2021-03-06 MED FILL — Ergocalciferol Cap 1.25 MG (50000 Unit): ORAL | 84 days supply | Qty: 12 | Fill #0 | Status: AC

## 2021-03-07 ENCOUNTER — Other Ambulatory Visit (HOSPITAL_COMMUNITY): Payer: Self-pay

## 2021-03-28 ENCOUNTER — Other Ambulatory Visit: Payer: Self-pay | Admitting: Family Medicine

## 2021-03-28 DIAGNOSIS — I10 Essential (primary) hypertension: Secondary | ICD-10-CM

## 2021-03-29 ENCOUNTER — Other Ambulatory Visit (HOSPITAL_COMMUNITY): Payer: Self-pay

## 2021-03-29 MED ORDER — HYDROCHLOROTHIAZIDE 25 MG PO TABS
25.0000 mg | ORAL_TABLET | Freq: Every day | ORAL | 1 refills | Status: DC
  Filled 2021-03-29: qty 90, 90d supply, fill #0
  Filled 2021-06-20: qty 90, 90d supply, fill #1

## 2021-04-08 ENCOUNTER — Other Ambulatory Visit (HOSPITAL_COMMUNITY): Payer: Self-pay

## 2021-04-08 DIAGNOSIS — D75839 Thrombocytosis, unspecified: Secondary | ICD-10-CM

## 2021-04-11 ENCOUNTER — Other Ambulatory Visit: Payer: Self-pay

## 2021-04-11 ENCOUNTER — Inpatient Hospital Stay (HOSPITAL_COMMUNITY): Payer: No Typology Code available for payment source | Attending: Hematology

## 2021-04-11 DIAGNOSIS — D75839 Thrombocytosis, unspecified: Secondary | ICD-10-CM | POA: Diagnosis present

## 2021-04-11 DIAGNOSIS — E876 Hypokalemia: Secondary | ICD-10-CM | POA: Diagnosis not present

## 2021-04-11 DIAGNOSIS — Z853 Personal history of malignant neoplasm of breast: Secondary | ICD-10-CM | POA: Diagnosis not present

## 2021-04-11 LAB — COMPREHENSIVE METABOLIC PANEL
ALT: 16 U/L (ref 0–44)
AST: 15 U/L (ref 15–41)
Albumin: 4.2 g/dL (ref 3.5–5.0)
Alkaline Phosphatase: 67 U/L (ref 38–126)
Anion gap: 8 (ref 5–15)
BUN: 11 mg/dL (ref 6–20)
CO2: 30 mmol/L (ref 22–32)
Calcium: 9.8 mg/dL (ref 8.9–10.3)
Chloride: 101 mmol/L (ref 98–111)
Creatinine, Ser: 0.55 mg/dL (ref 0.44–1.00)
GFR, Estimated: >60 mL/min (ref >60)
Glucose, Bld: 89 mg/dL (ref 70–99)
Potassium: 3.7 mmol/L (ref 3.5–5.1)
Sodium: 139 mmol/L (ref 135–145)
Total Bilirubin: 0.8 mg/dL (ref 0.3–1.2)
Total Protein: 7.8 g/dL (ref 6.5–8.1)

## 2021-04-11 LAB — CBC WITH DIFFERENTIAL/PLATELET
Abs Immature Granulocytes: 0.01 10*3/uL (ref 0.00–0.07)
Basophils Absolute: 0.1 10*3/uL (ref 0.0–0.1)
Basophils Relative: 1 %
Eosinophils Absolute: 0.2 10*3/uL (ref 0.0–0.5)
Eosinophils Relative: 3 %
HCT: 44.1 % (ref 36.0–46.0)
Hemoglobin: 14.4 g/dL (ref 12.0–15.0)
Immature Granulocytes: 0 %
Lymphocytes Relative: 39 %
Lymphs Abs: 3 10*3/uL (ref 0.7–4.0)
MCH: 30.7 pg (ref 26.0–34.0)
MCHC: 32.7 g/dL (ref 30.0–36.0)
MCV: 94 fL (ref 80.0–100.0)
Monocytes Absolute: 0.5 10*3/uL (ref 0.1–1.0)
Monocytes Relative: 7 %
Neutro Abs: 4 10*3/uL (ref 1.7–7.7)
Neutrophils Relative %: 50 %
Platelets: 473 10*3/uL — ABNORMAL HIGH (ref 150–400)
RBC: 4.69 MIL/uL (ref 3.87–5.11)
RDW: 14.5 % (ref 11.5–15.5)
WBC: 7.7 10*3/uL (ref 4.0–10.5)
nRBC: 0 % (ref 0.0–0.2)

## 2021-04-11 LAB — VITAMIN D 25 HYDROXY (VIT D DEFICIENCY, FRACTURES): Vit D, 25-Hydroxy: 48.08 ng/mL (ref 30–100)

## 2021-04-11 LAB — IRON AND TIBC
Iron: 114 ug/dL (ref 28–170)
Saturation Ratios: 29 % (ref 10.4–31.8)
TIBC: 397 ug/dL (ref 250–450)
UIBC: 283 ug/dL

## 2021-04-11 LAB — LACTATE DEHYDROGENASE: LDH: 140 U/L (ref 98–192)

## 2021-04-11 LAB — VITAMIN B12: Vitamin B-12: 327 pg/mL (ref 180–914)

## 2021-04-11 LAB — FERRITIN: Ferritin: 70 ng/mL (ref 11–307)

## 2021-04-18 ENCOUNTER — Other Ambulatory Visit: Payer: Self-pay

## 2021-04-18 ENCOUNTER — Inpatient Hospital Stay (HOSPITAL_BASED_OUTPATIENT_CLINIC_OR_DEPARTMENT_OTHER): Payer: No Typology Code available for payment source | Admitting: Physician Assistant

## 2021-04-18 VITALS — BP 134/85 | HR 81 | Temp 96.9°F | Resp 20 | Wt 193.4 lb

## 2021-04-18 DIAGNOSIS — D75839 Thrombocytosis, unspecified: Secondary | ICD-10-CM | POA: Diagnosis not present

## 2021-04-18 NOTE — Progress Notes (Signed)
[name], [name] [id number]   CLINIC:  Medical Oncology/Hematology  PCP:  Fayrene Helper, MD [address] [place] [address] [phone]   REASON FOR VISIT: Follow-up for thrombocytosis  CURRENT THERAPY: Observation   INTERVAL HISTORY:  [name] [name] 60 y.o. female returns for routine follow-up for thrombocytosis.  Patient reports she has been doing well since her last visit.  She denies any changes in her health.  Her energy levels are stable and she continues to complete all her ADLs on her own.  She has a good appetite without any noticeable weight loss.  Patient denies any nausea, vomiting or abdominal pain.  Her bowel movements are regular without any diarrhea or constipation.  She denies any easy bruising or signs of bleeding including epistaxis, hematuria, hematochezia, melena, or gingival bleeding. She denies any fevers, chills, shortness of breath, chest pain or cough. She denies any headaches or vision changes. She has no other complaints. Rest of the ROS is below.  REVIEW OF SYSTEMS:  Review of Systems  Constitutional:  Positive for fatigue (80%). Negative for appetite change, chills and fever.  Respiratory:  Negative for cough, shortness of breath and wheezing.   Cardiovascular:  Negative for chest pain, leg swelling and palpitations.  Gastrointestinal:  Negative for abdominal pain, blood in stool, constipation, diarrhea, nausea and vomiting.  Genitourinary:  Negative for dysuria and hematuria.   Skin:  Negative for itching and rash.  Neurological:  Negative for dizziness and headaches.  All other systems reviewed and are negative.   PAST MEDICAL/SURGICAL HISTORY:  Past Medical History:  Diagnosis Date   Adenocarcinoma of breast ([place])    left    Chronic neck pain 02/10/2016   Heel spur    Hyperlipidemia    Hypertension    Migraines    Obesity    Past Surgical History:  Procedure Laterality Date   BREAST  SURGERY     btl  1992   CHOLECYSTECTOMY     COLONOSCOPY  09/15/2011   Procedure: COLONOSCOPY;  Surgeon: Dorothyann Peng, MD;  [facility];  Service: Endoscopy;  Laterality: N/A;  9:15 AM   COLONOSCOPY N/A 08/16/2018   Procedure: COLONOSCOPY;  Surgeon: Danie Binder, MD;  Location: [facility];  Service: Endoscopy;  Laterality: N/A;  3:00   left lumpectomy and lymph node disection  2000   POLYPECTOMY  08/16/2018   Procedure: POLYPECTOMY;  Surgeon: Danie Binder, MD;  Location: [facility];  Service: Endoscopy;;  ascending colon(CSx3), descending colon(CSx1)   r heel surgery for spur and chipped bone  11/09/20099   TUBAL LIGATION       SOCIAL HISTORY:  Social History   Socioeconomic History   Marital status: Married    Spouse name: Not on file   Number of children: Not on file   Years of education: Not on file   Highest education level: Not on file  Occupational History   Not on file  Tobacco Use   Smoking status: Every Day    Packs/day: 0.25    Years: 15.00    Pack years: 3.75    Types: Cigarettes   Smokeless tobacco: Never   Tobacco comments:    smoking 3 per day  Vaping Use   Vaping Use: Never used  Substance and Sexual Activity   Alcohol use: No    Alcohol/week: 0.0 standard drinks   Drug use: No    Comment:  pt states "3 cigarrettes per day"   Sexual activity: Yes    Birth control/protection: Post-menopausal  Other Topics Concern   Not on file  Social History Narrative   Not on file   Social Determinants of Health   Financial Resource Strain: Not on file  Food Insecurity: Not on file  Transportation Needs: Not on file  Physical Activity: Not on file  Stress: Not on file  Social Connections: Not on file  Intimate Partner Violence: Not on file    FAMILY HISTORY:  Family History  Problem Relation Age of Onset   Heart attack Mother    Heart attack Father    Glaucoma Father    Hypertension Father    Diabetes Sister    Anesthesia  problems Neg Hx    Hypotension Neg Hx    Malignant hyperthermia Neg Hx    Pseudochol deficiency Neg Hx    Migraines Neg Hx     CURRENT MEDICATIONS:  Outpatient Encounter Medications as of 04/18/2021  Medication Sig   amLODipine (NORVASC) 2.5 MG tablet TAKE 1 TABLET (2.5 MG TOTAL) BY MOUTH DAILY.   Biotin 1000 MCG tablet Take 1,000 mcg by mouth daily at 6 (six) AM.    ergocalciferol (VITAMIN D2) 1.25 MG (50000 UT) capsule Take 1 capsule (50,000 Units total) by mouth once a week.   fluticasone (FLONASE) 50 MCG/ACT nasal spray USE 2 SPRAYS IN EACH NOSTRIL ONCE DAILY   hydrochlorothiazide (HYDRODIURIL) 25 MG tablet Take 1 tablet (25 mg total) by mouth daily.   ibuprofen (ADVIL) 800 MG tablet TAKE 1 TABLET (800 MG TOTAL) BY MOUTH 3 (THREE) TIMES DAILY.   potassium chloride SA (KLOR-CON) 20 MEQ tablet TAKE 1 TABLET (20 MEQ TOTAL) BY MOUTH DAILY.   Vitamin D, Ergocalciferol, (DRISDOL) 1.25 MG (50000 UNIT) CAPS capsule TAKE 1 CAPSULE (50,000 UNITS TOTAL) BY MOUTH ONCE A WEEK.   No facility-administered encounter medications on file as of 04/18/2021.    ALLERGIES:  Allergies  Allergen Reactions   Ace Inhibitors Cough     PHYSICAL EXAM:  ECOG Performance status: 1  Vitals:   04/18/21 1149  BP: 134/85  Pulse: 81  Resp: 20  Temp: (!) 96.9 F (36.1 C)  SpO2: 99%   Filed Weights   04/18/21 1149  Weight: 193 lb 6.4 oz (87.7 kg)    Physical Exam Constitutional:      Appearance: Normal appearance. She is normal weight.  Cardiovascular:     Rate and Rhythm: Normal rate and regular rhythm.     Heart sounds: Normal heart sounds.  Pulmonary:     Effort: Pulmonary effort is normal.     Breath sounds: Normal breath sounds.  Abdominal:     General: Bowel sounds are normal.     Palpations: Abdomen is soft.  Musculoskeletal:        General: Normal range of motion.  Skin:    General: Skin is warm.  Neurological:     Mental Status: She is alert and oriented to person, place, and  time. Mental status is at baseline.  Psychiatric:        Mood and Affect: Mood normal.        Behavior: Behavior normal.        Thought Content: Thought content normal.        Judgment: Judgment normal.     LABORATORY DATA:  I have reviewed the labs as listed.  CBC    Component Value Date/Time   WBC 7.7 04/11/2021 0956  RBC 4.69 04/11/2021 0956   HGB 14.4 04/11/2021 0956   HGB 13.7 04/09/2017 1445   HCT 44.1 04/11/2021 0956   HCT 39.9 04/09/2017 1445   PLT 473 (H) 04/11/2021 0956   PLT 478 (H) 04/09/2017 1445   MCV 94.0 04/11/2021 0956   MCV 88 04/09/2017 1445   MCH 30.7 04/11/2021 0956   MCHC 32.7 04/11/2021 0956   RDW 14.5 04/11/2021 0956   RDW 14.9 04/09/2017 1445   LYMPHSABS 3.0 04/11/2021 0956   LYMPHSABS 4.5 (H) 04/09/2017 1445   MONOABS 0.5 04/11/2021 0956   EOSABS 0.2 04/11/2021 0956   EOSABS 0.2 04/09/2017 1445   BASOSABS 0.1 04/11/2021 0956   BASOSABS 0.1 04/09/2017 1445   CMP Latest Ref Rng & Units 04/11/2021 10/08/2020 04/05/2020  Glucose 70 - 99 mg/dL 89 96 83  BUN 6 - 20 mg/dL '11 7 18  '$ Creatinine 0.44 - 1.00 mg/dL 0.55 0.58 0.84  Sodium 135 - 145 mmol/L 139 138 144  Potassium 3.5 - 5.1 mmol/L 3.7 3.7 3.6  Chloride 98 - 111 mmol/L 101 103 106  CO2 22 - 32 mmol/L '30 26 27  '$ Calcium 8.9 - 10.3 mg/dL 9.8 9.5 9.9  Total Protein 6.5 - 8.1 g/dL 7.8 7.7 7.6  Total Bilirubin 0.3 - 1.2 mg/dL 0.8 0.8 0.4  Alkaline Phos 38 - 126 U/L 67 61 68  AST 15 - 41 U/L '15 18 16  '$ ALT 0 - 44 U/L '16 16 16    '$ ASSESSMENT & PLAN:  1.  Thrombocytosis: - Myeloproliferative work-up negative for Jak 2/exon 12/CALR/MPL. -Denies any vasomotor symptoms like erythromelalgia or aquagenic pruritus. - Labs from 04/11/21 showed her hemoglobin 14.4, hematocrit 44.1, platelets 473, ferritin 70, percent saturation 29. - Her platelet count over the last several years has been stable. Recommend to continue to observe without any intervention. -- If there is any significant change in her  platelet count we will consider bone marrow aspiration and biopsy. - She will follow-up in 6 months with repeat labs.   2.  Left breast cancer: -Diagnosed in 2006, status post lumpectomy. -Reportedly underwent chemotherapy followed by radiation.  She did not receive antiestrogen therapy. -Mammogram dated 9/212021 shows BI-RADS category 1 negative. - Repeat mammogram planned in August 2022   3.  Hypokalemia: -She is on 20 mEq of potassium daily. - Her potassium level from 04/11/2021 was 3.7.    Disposition: -RTC in 6 months with repeat labs (CBC, CMP, iron panel, ferritin, vitamin D, vitamin B12 and LDH)    Orders placed this encounter:  Orders Placed This Encounter  Procedures   CBC with Differential   Comprehensive metabolic panel   Lactate dehydrogenase   Ferritin   Iron and TIBC   Vitamin D 25 hydroxy    Patient expressed understanding and satisfaction with the plan provided.   I have spent a total of 25 minutes minutes of face-to-face and non-face-to-face time, preparing to see the patient, obtaining and/or reviewing separately obtained history, performing a medically appropriate examination, counseling and educating the patient, ordering tests, documenting clinical information in the electronic health record, and care coordination.   Lincoln Brigham, PA-C Hematology and [place] at [facility]

## 2021-04-19 ENCOUNTER — Other Ambulatory Visit (HOSPITAL_COMMUNITY): Payer: Self-pay

## 2021-04-19 ENCOUNTER — Other Ambulatory Visit: Payer: Self-pay | Admitting: Family Medicine

## 2021-04-19 MED ORDER — IBUPROFEN 800 MG PO TABS
800.0000 mg | ORAL_TABLET | Freq: Three times a day (TID) | ORAL | 0 refills | Status: DC
  Filled 2021-04-19: qty 21, 7d supply, fill #0

## 2021-05-02 ENCOUNTER — Other Ambulatory Visit (HOSPITAL_COMMUNITY): Payer: Self-pay

## 2021-05-03 ENCOUNTER — Other Ambulatory Visit (HOSPITAL_COMMUNITY): Payer: Self-pay

## 2021-05-17 ENCOUNTER — Other Ambulatory Visit: Payer: Self-pay

## 2021-05-17 ENCOUNTER — Ambulatory Visit (INDEPENDENT_AMBULATORY_CARE_PROVIDER_SITE_OTHER): Payer: No Typology Code available for payment source | Admitting: Family Medicine

## 2021-05-17 ENCOUNTER — Encounter: Payer: Self-pay | Admitting: Family Medicine

## 2021-05-17 VITALS — BP 120/80 | HR 85 | Resp 15 | Ht 62.0 in | Wt 194.0 lb

## 2021-05-17 DIAGNOSIS — E7849 Other hyperlipidemia: Secondary | ICD-10-CM

## 2021-05-17 DIAGNOSIS — Z23 Encounter for immunization: Secondary | ICD-10-CM

## 2021-05-17 DIAGNOSIS — I10 Essential (primary) hypertension: Secondary | ICD-10-CM

## 2021-05-17 DIAGNOSIS — R7303 Prediabetes: Secondary | ICD-10-CM | POA: Diagnosis not present

## 2021-05-17 DIAGNOSIS — F17218 Nicotine dependence, cigarettes, with other nicotine-induced disorders: Secondary | ICD-10-CM

## 2021-05-17 DIAGNOSIS — E8881 Metabolic syndrome: Secondary | ICD-10-CM

## 2021-05-17 NOTE — Progress Notes (Signed)
[name] [name]     MRN: [id number]      DOB: 09/12/1961   HPI [name] [name] is here for follow up and re-evaluation of chronic medical conditions, medication management and review of any available recent lab and radiology data.  Preventive health is updated, specifically  Cancer screening and Immunization.   Questions or concerns regarding consultations or procedures which the PT has had in the interim are  addressed. The PT denies any adverse reactions to current medications since the last visit.  There are no new concerns.  There are no specific complaints   ROS Denies recent fever or chills. Denies sinus pressure, nasal congestion, ear pain or sore throat. Denies chest congestion, productive cough or wheezing. Denies chest pains, palpitations and leg swelling Denies abdominal pain, nausea, vomiting,diarrhea or constipation.   Denies dysuria, frequency, hesitancy or incontinence. Denies joint pain, swelling and limitation in mobility. Denies headaches, seizures, numbness, or tingling. Denies depression, anxiety or insomnia. Denies skin break down or rash.   PE  BP 120/80   Pulse 85   Resp 15   Ht '5\' 2"'$  (1.575 m)   Wt 194 lb (88 kg)   SpO2 95%   BMI 35.48 kg/m   Patient alert and oriented and in no cardiopulmonary distress.  HEENT: No facial asymmetry, EOMI,     Neck supple .  Chest: Clear to auscultation bilaterally.  CVS: S1, S2 no murmurs, no S3.Regular rate.  ABD: Soft non tender.   Ext: No edema  MS: Adequate ROM spine, shoulders, hips and knees.  Skin: Intact, no ulcerations or rash noted.  Psych: Good eye contact, normal affect. Memory intact not anxious or depressed appearing.  CNS: CN 2-12 intact, power,  normal throughout.no focal deficits noted.   Assessment & Plan Essential hypertension Controlled, no change in medication DASH diet and commitment to daily physical activity for a minimum of 30 minutes discussed and encouraged, as a part of  hypertension management. The importance of attaining a healthy weight is also discussed.  BP/Weight 05/17/2021 04/18/2021 01/05/2021 08/09/2020 04/19/2020 10/20/2019 Q000111Q  Systolic BP 123456 Q000111Q - [id number] 123456 [id number]  Diastolic BP 80 85 - 80 80 86 83  Wt. (Lbs) 194 193.4 186 196 201.4 194 198.1  BMI 35.48 35.37 34.02 35.85 36.84 35.48 36.23  Some encounter information is confidential and restricted. Go to Review Flowsheets activity to see all data.       Nicotine dependence Asked:confirms currently smokes cigarettes 3/day Assess: Unwilling to set a quit date, Advise: needs to QUIT to reduce risk of cancer, cardio and cerebrovascular disease Assist: counseled for 5 minutes and literature provided Arrange: follow up in 2 to 4 months   Prediabetes Patient educated about the importance of limiting  Carbohydrate intake , the need to commit to daily physical activity for a minimum of 30 minutes , and to commit weight loss. The fact that changes in all these areas will reduce or eliminate all together the development of diabetes is stressed.   Diabetic Labs Latest Ref Rng & Units 04/11/2021 01/10/2021 10/08/2020 04/19/2020 04/19/2020  HbA1c 4.8 - 5.6 % - 6.1(H) - 5.8 5.8  Chol 100 - 199 mg/dL - 206(H) - - -  HDL >39 mg/dL - 60 - - -  Calc LDL 0 - 99 mg/dL - 130(H) - - -  Triglycerides 0 - 149 mg/dL - 87 - - -  Creatinine 0.44 - 1.00 mg/dL 0.55 - 0.58 - -   BP/Weight 05/17/2021 04/18/2021  01/05/2021 08/09/2020 04/19/2020 10/20/2019 Q000111Q  Systolic BP 123456 Q000111Q - [id number] 123456 [id number]  Diastolic BP 80 85 - 80 80 86 83  Wt. (Lbs) 194 193.4 186 196 201.4 194 198.1  BMI 35.48 35.37 34.02 35.85 36.84 35.48 36.23  Some encounter information is confidential and restricted. Go to Review Flowsheets activity to see all data.   No flowsheet data found.  Updated lab needed at/ before next visit.   Hyperlipemia Hyperlipidemia:Low fat diet discussed and encouraged.   Lipid Panel  Lab Results  Component  Value Date   CHOL 206 (H) 01/10/2021   HDL 60 01/10/2021   LDLCALC 130 (H) 01/10/2021   TRIG 87 01/10/2021   CHOLHDL 3.4 01/10/2021     Updated lab needed at/ before next visit.   Metabolic syndrome X The increased risk of cardiovascular disease associated with this diagnosis, and the need to consistently work on lifestyle to change this is discussed. Following  a  heart healthy diet ,commitment to 30 minutes of exercise at least 5 days per week, as well as control of blood sugar and cholesterol , and achieving a healthy weight are all the areas to be addressed .

## 2021-05-17 NOTE — Assessment & Plan Note (Signed)
Asked:confirms currently smokes cigarettes 3/day Assess: Unwilling to set a quit date, Advise: needs to QUIT to reduce risk of cancer, cardio and cerebrovascular disease Assist: counseled for 5 minutes and literature provided Arrange: follow up in 2 to 4 months

## 2021-05-17 NOTE — Assessment & Plan Note (Signed)
Controlled, no change in medication DASH diet and commitment to daily physical activity for a minimum of 30 minutes discussed and encouraged, as a part of hypertension management. The importance of attaining a healthy weight is also discussed.  BP/Weight 05/17/2021 04/18/2021 01/05/2021 08/09/2020 04/19/2020 10/20/2019 Q000111Q  Systolic BP 123456 Q000111Q - [id number] 123456 [id number]  Diastolic BP 80 85 - 80 80 86 83  Wt. (Lbs) 194 193.4 186 196 201.4 194 198.1  BMI 35.48 35.37 34.02 35.85 36.84 35.48 36.23  Some encounter information is confidential and restricted. Go to Review Flowsheets activity to see all data.

## 2021-05-17 NOTE — Assessment & Plan Note (Signed)
Patient educated about the importance of limiting  Carbohydrate intake , the need to commit to daily physical activity for a minimum of 30 minutes , and to commit weight loss. The fact that changes in all these areas will reduce or eliminate all together the development of diabetes is stressed.   Diabetic Labs Latest Ref Rng & Units 04/11/2021 01/10/2021 10/08/2020 04/19/2020 04/19/2020  HbA1c 4.8 - 5.6 % - 6.1(H) - 5.8 5.8  Chol 100 - 199 mg/dL - 206(H) - - -  HDL >39 mg/dL - 60 - - -  Calc LDL 0 - 99 mg/dL - 130(H) - - -  Triglycerides 0 - 149 mg/dL - 87 - - -  Creatinine 0.44 - 1.00 mg/dL 0.55 - 0.58 - -   BP/Weight 05/17/2021 04/18/2021 01/05/2021 08/09/2020 04/19/2020 10/20/2019 Q000111Q  Systolic BP 123456 Q000111Q - [id number] 123456 [id number]  Diastolic BP 80 85 - 80 80 86 83  Wt. (Lbs) 194 193.4 186 196 201.4 194 198.1  BMI 35.48 35.37 34.02 35.85 36.84 35.48 36.23  Some encounter information is confidential and restricted. Go to Review Flowsheets activity to see all data.   No flowsheet data found.  Updated lab needed at/ before next visit.

## 2021-05-17 NOTE — Assessment & Plan Note (Signed)
Hyperlipidemia:Low fat diet discussed and encouraged.   Lipid Panel  Lab Results  Component Value Date   CHOL 206 (H) 01/10/2021   HDL 60 01/10/2021   LDLCALC 130 (H) 01/10/2021   TRIG 87 01/10/2021   CHOLHDL 3.4 01/10/2021     Updated lab needed at/ before next visit.

## 2021-05-17 NOTE — Patient Instructions (Signed)
Annual exam END NOVEMBER CALL IF YOU NEED ME SOONER. sHINGRIX #2 AT VISIT  SHINGRIX #1 TODAY  FASTING LIPID, CMP AND egfr, hba1c 3 DAYS BEFORE NEXT VISIT  It is important that you exercise regularly at least 30 minutes 5 times a week. If you develop chest pain, have severe difficulty breathing, or feel very tired, stop exercising immediately and seek medical attention   Think about what you will eat, plan ahead. Choose " clean, green, fresh or frozen" over canned, processed or packaged foods which are more sugary, salty and fatty. 70 to 75% of food eaten should be vegetables and fruit. Three meals at set times with snacks allowed between meals, but they must be fruit or vegetables. Aim to eat over a 12 hour period , example 7 am to 7 pm, and STOP after  your last meal of the day. Drink water,generally about 64 ounces per day, no other drink is as healthy. Fruit juice is best enjoyed in a healthy way, by EATING the fruit . Thanks for choosing [facility], we consider it a privelige to serve you.

## 2021-05-17 NOTE — Assessment & Plan Note (Signed)
The increased risk of cardiovascular disease associated with this diagnosis, and the need to consistently work on lifestyle to change this is discussed. Following  a  heart healthy diet ,commitment to 30 minutes of exercise at least 5 days per week, as well as control of blood sugar and cholesterol , and achieving a healthy weight are all the areas to be addressed .  

## 2021-05-25 ENCOUNTER — Other Ambulatory Visit (HOSPITAL_COMMUNITY): Payer: Self-pay

## 2021-05-26 ENCOUNTER — Other Ambulatory Visit: Payer: Self-pay | Admitting: Family Medicine

## 2021-05-26 ENCOUNTER — Other Ambulatory Visit (HOSPITAL_COMMUNITY): Payer: Self-pay | Admitting: Nurse Practitioner

## 2021-05-26 ENCOUNTER — Other Ambulatory Visit (HOSPITAL_COMMUNITY): Payer: Self-pay

## 2021-05-26 MED ORDER — VITAMIN D (ERGOCALCIFEROL) 1.25 MG (50000 UNIT) PO CAPS
50000.0000 [IU] | ORAL_CAPSULE | ORAL | 3 refills | Status: DC
  Filled 2021-05-26: qty 12, 84d supply, fill #0
  Filled 2021-08-22: qty 12, 84d supply, fill #1
  Filled 2021-11-21: qty 12, 84d supply, fill #2
  Filled 2022-01-28: qty 12, 84d supply, fill #3
  Filled 2022-04-27: qty 12, 84d supply, fill #4

## 2021-06-17 ENCOUNTER — Ambulatory Visit (HOSPITAL_COMMUNITY)
Admission: RE | Admit: 2021-06-17 | Discharge: 2021-06-17 | Disposition: A | Discharge status: Home / Self Care | Payer: No Typology Code available for payment source | Source: Ambulatory Visit | Attending: Family Medicine | Admitting: Family Medicine

## 2021-06-17 ENCOUNTER — Other Ambulatory Visit: Payer: Self-pay

## 2021-06-17 DIAGNOSIS — Z1231 Encounter for screening mammogram for malignant neoplasm of breast: Secondary | ICD-10-CM | POA: Insufficient documentation

## 2021-06-20 ENCOUNTER — Other Ambulatory Visit: Payer: Self-pay | Admitting: Family Medicine

## 2021-06-20 ENCOUNTER — Other Ambulatory Visit (HOSPITAL_COMMUNITY): Payer: Self-pay

## 2021-06-21 ENCOUNTER — Other Ambulatory Visit (HOSPITAL_COMMUNITY): Payer: Self-pay

## 2021-06-21 ENCOUNTER — Other Ambulatory Visit: Payer: Self-pay | Admitting: *Deleted

## 2021-06-21 ENCOUNTER — Telehealth: Payer: Self-pay

## 2021-06-21 MED ORDER — IBUPROFEN 800 MG PO TABS
800.0000 mg | ORAL_TABLET | Freq: Three times a day (TID) | ORAL | 0 refills | Status: DC
  Filled 2021-06-21: qty 21, 7d supply, fill #0

## 2021-06-21 NOTE — Telephone Encounter (Signed)
Would you like to refill? 

## 2021-06-21 NOTE — Telephone Encounter (Signed)
Pt advised this was sent to pharmacy with recommendations

## 2021-06-21 NOTE — Telephone Encounter (Signed)
Patient called said the pharmacy why was her Ibuprofen denied.  Call back # [phone]

## 2021-07-29 ENCOUNTER — Other Ambulatory Visit: Payer: Self-pay | Admitting: Family Medicine

## 2021-07-29 DIAGNOSIS — I10 Essential (primary) hypertension: Secondary | ICD-10-CM

## 2021-08-01 ENCOUNTER — Other Ambulatory Visit (HOSPITAL_COMMUNITY): Payer: Self-pay

## 2021-08-01 MED ORDER — POTASSIUM CHLORIDE CRYS ER 20 MEQ PO TBCR
20.0000 meq | EXTENDED_RELEASE_TABLET | Freq: Every day | ORAL | 1 refills | Status: DC
  Filled 2021-08-01: qty 90, 90d supply, fill #0
  Filled 2021-10-30: qty 90, 90d supply, fill #1

## 2021-08-01 MED ORDER — AMLODIPINE BESYLATE 2.5 MG PO TABS
2.5000 mg | ORAL_TABLET | Freq: Every day | ORAL | 1 refills | Status: DC
  Filled 2021-08-01: qty 90, 90d supply, fill #0
  Filled 2021-10-30: qty 90, 90d supply, fill #1

## 2021-08-02 ENCOUNTER — Other Ambulatory Visit (HOSPITAL_COMMUNITY): Payer: Self-pay

## 2021-08-23 ENCOUNTER — Other Ambulatory Visit (HOSPITAL_COMMUNITY): Payer: Self-pay

## 2021-08-23 ENCOUNTER — Encounter: Payer: Self-pay | Admitting: Family Medicine

## 2021-08-23 ENCOUNTER — Other Ambulatory Visit: Payer: Self-pay

## 2021-08-23 ENCOUNTER — Ambulatory Visit (INDEPENDENT_AMBULATORY_CARE_PROVIDER_SITE_OTHER): Payer: No Typology Code available for payment source | Admitting: Family Medicine

## 2021-08-23 VITALS — BP 137/84 | HR 98 | Ht 62.0 in | Wt 193.1 lb

## 2021-08-23 DIAGNOSIS — Z23 Encounter for immunization: Secondary | ICD-10-CM | POA: Diagnosis not present

## 2021-08-23 DIAGNOSIS — E7849 Other hyperlipidemia: Secondary | ICD-10-CM

## 2021-08-23 DIAGNOSIS — Z0000 Encounter for general adult medical examination without abnormal findings: Secondary | ICD-10-CM

## 2021-08-23 DIAGNOSIS — E559 Vitamin D deficiency, unspecified: Secondary | ICD-10-CM | POA: Diagnosis not present

## 2021-08-23 DIAGNOSIS — R7303 Prediabetes: Secondary | ICD-10-CM

## 2021-08-23 DIAGNOSIS — I10 Essential (primary) hypertension: Secondary | ICD-10-CM

## 2021-08-23 NOTE — Patient Instructions (Addendum)
   F/U in 4 months, call if you need me before, goals of stopping smoking and changing eating habits, TdAP at visit  Fasting lipid, cmp and EGFr and HBa1C and vit D week of Dec 12   Nurse pls document vision screen   shingrix #2 today  Reconsider Covid booster

## 2021-08-23 NOTE — Progress Notes (Signed)
    [name] [name]     MRN: [id number]      DOB: 11-04-1960  HPI: Patient is in for annual physical exam. No other health concerns are expressed or addressed at the visit. Recent labs,  are reviewed. Immunization is reviewed , and  updated if needed.   PE: BP 137/84   Pulse 98   Ht 5\' 2"  (1.575 m)   Wt 193 lb 1.3 oz (87.6 kg)   SpO2 93%   BMI 35.31 kg/m  Pleasant  female, alert and oriented x 3, in no cardio-pulmonary distress. Afebrile. HEENT No facial trauma or asymetry. Sinuses non tender.  Extra occullar muscles intact.. External ears normal, . Neck: supple, no adenopathy,JVD or thyromegaly.No bruits.  Chest: Clear to ascultation bilaterally.No crackles or wheezes. Non tender to palpation    Cardiovascular system; Heart sounds normal,  S1 and  S2 ,no S3.  No murmur, or thrill. Apical beat not displaced Peripheral pulses normal.  Abdomen: Soft, non tender,  no guarding or rebound, norml BS.  Musculoskeletal exam: Full ROM of spine, hips , shoulders and knees. No deformity ,swelling or crepitus noted. No muscle wasting or atrophy.   Neurologic: Cranial nerves 2 to 12 intact. Power, tone ,sensation and reflexes normal throughout. No disturbance in gait. No tremor.  Skin: Intact, no ulceration, erythema , scaling or rash noted. Pigmentation normal throughout  Psych; Normal mood and affect. Judgement and concentration normal   Assessment & Plan:  Annual physical exam Annual exam as documented. Counseling done  re healthy lifestyle involving commitment to 150 minutes exercise per week, heart healthy diet, and attaining healthy weight.The importance of adequate sleep also discussed. Regular seat belt use and home safety, is also discussed. Changes in health habits are decided on by the patient with goals and time frames  set for achieving them. Immunization and cancer screening needs are specifically addressed at this visit.

## 2021-08-23 NOTE — Progress Notes (Signed)
    [name] [name]     MRN: [id number]      DOB: April 22, 1961  HPI: Patient is in for annual physical exam. No other health concerns are expressed or addressed at the visit. Recent labs,  are reviewed. Immunization is reviewed , and  updated if needed.   PE: Pleasant  female, alert and oriented x 3, in no cardio-pulmonary distress. Afebrile. HEENT No facial trauma or asymetry. Sinuses non tender.  Extra occullar muscles intact.. External ears normal, . Neck: supple, no adenopathy,JVD or thyromegaly.No bruits.  Chest: Clear to ascultation bilaterally.No crackles or wheezes. Non tender to palpation  Breast: No asymetry,no masses or lumps. No tenderness. No nipple discharge or inversion. No axillary or supraclavicular adenopathy  Cardiovascular system; Heart sounds normal,  S1 and  S2 ,no S3.  No murmur, or thrill. Apical beat not displaced Peripheral pulses normal.  Abdomen: Soft, non tender, no organomegaly or masses. No bruits. Bowel sounds normal. No guarding, tenderness or rebound.   GU: External genitalia normal female genitalia , normal female distribution of hair. No lesions. Urethral meatus normal in size, no  Prolapse, no lesions visibly  Present. Bladder non tender. Vagina pink and moist , with no visible lesions , discharge present . Adequate pelvic support no  cystocele or rectocele noted Cervix pink and appears healthy, no lesions or ulcerations noted, no discharge noted from os Uterus normal size, no adnexal masses, no cervical motion or adnexal tenderness.   Musculoskeletal exam: Full ROM of spine, hips , shoulders and knees. No deformity ,swelling or crepitus noted. No muscle wasting or atrophy.   Neurologic: Cranial nerves 2 to 12 intact. Power, tone ,sensation and reflexes normal throughout. No disturbance in gait. No tremor.  Skin: Intact, no ulceration, erythema , scaling or rash noted. Pigmentation normal throughout  Psych; Normal mood  and affect. Judgement and concentration normal   Assessment & Plan:  No problem-specific Assessment & Plan notes found for this encounter.

## 2021-08-23 NOTE — Assessment & Plan Note (Signed)

## 2021-09-05 ENCOUNTER — Other Ambulatory Visit: Payer: Self-pay

## 2021-09-05 LAB — HEMOGLOBIN A1C
Hgb A1c MFr Bld: 5.8 % of total Hgb — ABNORMAL HIGH (ref <5.7)
Mean Plasma Glucose: 120 mg/dL
eAG (mmol/L): 6.6 mmol/L

## 2021-09-05 LAB — COMPLETE METABOLIC PANEL WITH GFR
AG Ratio: 1.4 (calc) (ref 1.0–2.5)
ALT: 9 U/L (ref 6–29)
AST: 9 U/L — ABNORMAL LOW (ref 10–35)
Albumin: 4.1 g/dL (ref 3.6–5.1)
Alkaline phosphatase (APISO): 68 U/L (ref 37–153)
BUN: 11 mg/dL (ref 7–25)
CO2: 31 mmol/L (ref 20–32)
Calcium: 9.8 mg/dL (ref 8.6–10.4)
Chloride: 101 mmol/L (ref 98–110)
Creat: 0.62 mg/dL (ref 0.50–1.03)
Globulin: 2.9 g/dL (calc) (ref 1.9–3.7)
Glucose, Bld: 70 mg/dL (ref 65–99)
Potassium: 4.4 mmol/L (ref 3.5–5.3)
Sodium: 141 mmol/L (ref 135–146)
Total Bilirubin: 0.4 mg/dL (ref 0.2–1.2)
Total Protein: 7 g/dL (ref 6.1–8.1)
eGFR: 103 mL/min/{1.73_m2} (ref >=60)

## 2021-09-05 LAB — LIPID PANEL
Cholesterol: 195 mg/dL (ref <200)
HDL: 50 mg/dL (ref >=50)
LDL Cholesterol (Calc): 124 mg/dL (calc) — ABNORMAL HIGH
Non-HDL Cholesterol (Calc): 145 mg/dL (calc) — ABNORMAL HIGH (ref <130)
Total CHOL/HDL Ratio: 3.9 (calc) (ref <5.0)
Triglycerides: 105 mg/dL (ref <150)

## 2021-09-05 LAB — VITAMIN D 25 HYDROXY (VIT D DEFICIENCY, FRACTURES): Vit D, 25-Hydroxy: 64 ng/mL (ref 30–100)

## 2021-09-05 NOTE — Addendum Note (Signed)
Addended by: Glee Arvin D on: 09/05/2021 09:34 AM   Modules accepted: Orders

## 2021-09-12 ENCOUNTER — Encounter: Payer: Self-pay | Admitting: *Deleted

## 2021-09-27 ENCOUNTER — Other Ambulatory Visit: Payer: Self-pay | Admitting: Family Medicine

## 2021-09-27 DIAGNOSIS — I10 Essential (primary) hypertension: Secondary | ICD-10-CM

## 2021-09-28 ENCOUNTER — Other Ambulatory Visit (HOSPITAL_COMMUNITY): Payer: Self-pay

## 2021-09-28 MED ORDER — HYDROCHLOROTHIAZIDE 25 MG PO TABS
25.0000 mg | ORAL_TABLET | Freq: Every day | ORAL | 1 refills | Status: DC
  Filled 2021-09-28: qty 90, 90d supply, fill #0
  Filled 2021-12-28: qty 90, 90d supply, fill #1

## 2021-09-28 MED ORDER — IBUPROFEN 800 MG PO TABS
800.0000 mg | ORAL_TABLET | Freq: Three times a day (TID) | ORAL | 0 refills | Status: DC
  Filled 2021-09-28: qty 21, 7d supply, fill #0

## 2021-10-19 ENCOUNTER — Other Ambulatory Visit: Payer: Self-pay

## 2021-10-19 ENCOUNTER — Inpatient Hospital Stay (HOSPITAL_COMMUNITY): Payer: No Typology Code available for payment source | Attending: Hematology

## 2021-10-19 DIAGNOSIS — E876 Hypokalemia: Secondary | ICD-10-CM | POA: Insufficient documentation

## 2021-10-19 DIAGNOSIS — E785 Hyperlipidemia, unspecified: Secondary | ICD-10-CM | POA: Diagnosis not present

## 2021-10-19 DIAGNOSIS — D75839 Thrombocytosis, unspecified: Secondary | ICD-10-CM | POA: Diagnosis present

## 2021-10-19 DIAGNOSIS — Z853 Personal history of malignant neoplasm of breast: Secondary | ICD-10-CM | POA: Insufficient documentation

## 2021-10-19 DIAGNOSIS — E669 Obesity, unspecified: Secondary | ICD-10-CM | POA: Insufficient documentation

## 2021-10-19 LAB — COMPREHENSIVE METABOLIC PANEL
ALT: 14 U/L (ref 0–44)
AST: 17 U/L (ref 15–41)
Albumin: 4.3 g/dL (ref 3.5–5.0)
Alkaline Phosphatase: 59 U/L (ref 38–126)
Anion gap: 10 (ref 5–15)
BUN: 9 mg/dL (ref 6–20)
CO2: 25 mmol/L (ref 22–32)
Calcium: 9.7 mg/dL (ref 8.9–10.3)
Chloride: 105 mmol/L (ref 98–111)
Creatinine, Ser: 0.56 mg/dL (ref 0.44–1.00)
GFR, Estimated: >60 mL/min (ref >60)
Glucose, Bld: 87 mg/dL (ref 70–99)
Potassium: 3.6 mmol/L (ref 3.5–5.1)
Sodium: 140 mmol/L (ref 135–145)
Total Bilirubin: 0.8 mg/dL (ref 0.3–1.2)
Total Protein: 7.7 g/dL (ref 6.5–8.1)

## 2021-10-19 LAB — CBC WITH DIFFERENTIAL/PLATELET
Abs Immature Granulocytes: 0.02 10*3/uL (ref 0.00–0.07)
Basophils Absolute: 0 10*3/uL (ref 0.0–0.1)
Basophils Relative: 1 %
Eosinophils Absolute: 0.2 10*3/uL (ref 0.0–0.5)
Eosinophils Relative: 2 %
HCT: 41.7 % (ref 36.0–46.0)
Hemoglobin: 13.3 g/dL (ref 12.0–15.0)
Immature Granulocytes: 0 %
Lymphocytes Relative: 39 %
Lymphs Abs: 3.2 10*3/uL (ref 0.7–4.0)
MCH: 29.2 pg (ref 26.0–34.0)
MCHC: 31.9 g/dL (ref 30.0–36.0)
MCV: 91.4 fL (ref 80.0–100.0)
Monocytes Absolute: 0.5 10*3/uL (ref 0.1–1.0)
Monocytes Relative: 7 %
Neutro Abs: 4.1 10*3/uL (ref 1.7–7.7)
Neutrophils Relative %: 51 %
Platelets: 461 10*3/uL — ABNORMAL HIGH (ref 150–400)
RBC: 4.56 MIL/uL (ref 3.87–5.11)
RDW: 14.3 % (ref 11.5–15.5)
WBC: 8.1 10*3/uL (ref 4.0–10.5)
nRBC: 0 % (ref 0.0–0.2)

## 2021-10-19 LAB — IRON AND TIBC
Iron: 83 ug/dL (ref 28–170)
Saturation Ratios: 22 % (ref 10.4–31.8)
TIBC: 378 ug/dL (ref 250–450)
UIBC: 295 ug/dL

## 2021-10-19 LAB — VITAMIN D 25 HYDROXY (VIT D DEFICIENCY, FRACTURES): Vit D, 25-Hydroxy: 66.1 ng/mL (ref 30–100)

## 2021-10-19 LAB — LACTATE DEHYDROGENASE: LDH: 149 U/L (ref 98–192)

## 2021-10-19 LAB — FERRITIN: Ferritin: 113 ng/mL (ref 11–307)

## 2021-10-25 NOTE — Progress Notes (Deleted)
RESCHEDULED

## 2021-10-26 ENCOUNTER — Inpatient Hospital Stay (HOSPITAL_COMMUNITY): Payer: No Typology Code available for payment source | Admitting: Physician Assistant

## 2021-10-31 ENCOUNTER — Other Ambulatory Visit (HOSPITAL_COMMUNITY): Payer: Self-pay

## 2021-11-18 NOTE — Progress Notes (Signed)
[place], [place] [address]   CLINIC:  Medical Oncology/Hematology  PCP:  Fayrene Helper, MD [address], [address] [place] [address] [phone]   REASON FOR VISIT:  Follow-up for thrombocytosis  CURRENT THERAPY: Observation  INTERVAL HISTORY:  [name] 61 y.o. female returns for routine follow-up of thrombocytosis.  She was last seen by Dede Query PA-C on 04/18/2021.  At today's visit, she reports feeling fairly well.  No recent hospitalizations, surgeries, or changes in baseline health status.  Regarding her thrombocytosis, she denies any symptoms of erythromelalgia, aquagenic pruritus, or vasomotor symptoms. No B symptoms such as fever, chills, night sweats, unintentional weight loss. She does admit to tobacco use (smokes 3 cigarettes/day).  She denies any autoimmune or rheumatoid diseases.  Regarding her history of left-sided breast cancer, she denies any symptoms of recurrence such as new lumps, bone pain, chest pain, dyspnea, or abdominal pain.  She has no new headaches, seizures, or focal neurologic deficits.  No B symptoms such as fever, chills, night sweats, unintentional weight loss.  She receives annual mammograms and breast exams via her PCP.  She has 100% energy and 100% appetite. She endorses that she is maintaining a stable weight.   REVIEW OF SYSTEMS:  Review of Systems  Constitutional:  Negative for appetite change, chills, diaphoresis, fatigue, fever and unexpected weight change.  HENT:   Negative for lump/mass and nosebleeds.   Eyes:  Negative for eye problems.  Respiratory:  Negative for cough, hemoptysis and shortness of breath.   Cardiovascular:  Negative for chest pain, leg swelling and palpitations.  Gastrointestinal:  Negative for abdominal pain, blood in stool, constipation, diarrhea, nausea and vomiting.  Genitourinary:  Negative for hematuria.   Skin: Negative.   Neurological:  Negative for  dizziness, headaches and light-headedness.  Hematological:  Does not bruise/bleed easily.     PAST MEDICAL/SURGICAL HISTORY:  Past Medical History:  Diagnosis Date   Adenocarcinoma of breast ([place])    left    Chronic neck pain 02/10/2016   Heel spur    Hyperlipidemia    Hypertension    Migraines    Obesity    Past Surgical History:  Procedure Laterality Date   BREAST SURGERY     btl  1992   CHOLECYSTECTOMY     COLONOSCOPY  09/15/2011   Procedure: COLONOSCOPY;  Surgeon: Dorothyann Peng, MD;  [facility];  Service: Endoscopy;  Laterality: N/A;  9:15 AM   COLONOSCOPY N/A 08/16/2018   Procedure: COLONOSCOPY;  Surgeon: Danie Binder, MD;  Location: [facility];  Service: Endoscopy;  Laterality: N/A;  3:00   left lumpectomy and lymph node disection  2000   POLYPECTOMY  08/16/2018   Procedure: POLYPECTOMY;  Surgeon: Danie Binder, MD;  Location: [facility];  Service: Endoscopy;;  ascending colon(CSx3), descending colon(CSx1)   r heel surgery for spur and chipped bone  11/09/20099   TUBAL LIGATION       SOCIAL HISTORY:  Social History   Socioeconomic History   Marital status: Married    Spouse name: Not on file   Number of children: Not on file   Years of education: Not on file   Highest education level: Not on file  Occupational History   Not on file  Tobacco Use   Smoking status: Every Day    Packs/day: 0.25    Years: 15.00    Pack years: 3.75    Types:  Cigarettes   Smokeless tobacco: Never   Tobacco comments:    smoking 3 per day  Vaping Use   Vaping Use: Never used  Substance and Sexual Activity   Alcohol use: No    Alcohol/week: 0.0 standard drinks   Drug use: No    Comment: pt states "3 cigarrettes per day"   Sexual activity: Yes    Birth control/protection: Post-menopausal  Other Topics Concern   Not on file  Social History Narrative   Not on file   Social Determinants of Health   Financial Resource Strain: Not on file  Food  Insecurity: Not on file  Transportation Needs: Not on file  Physical Activity: Not on file  Stress: Not on file  Social Connections: Not on file  Intimate Partner Violence: Not on file    FAMILY HISTORY:  Family History  Problem Relation Age of Onset   Heart attack Mother    Heart attack Father    Glaucoma Father    Hypertension Father    Diabetes Sister    Anesthesia problems Neg Hx    Hypotension Neg Hx    Malignant hyperthermia Neg Hx    Pseudochol deficiency Neg Hx    Migraines Neg Hx     CURRENT MEDICATIONS:  Outpatient Encounter Medications as of 11/21/2021  Medication Sig   amLODipine (NORVASC) 2.5 MG tablet Take 1 tablet (2.5 mg total) by mouth daily.   Biotin 1000 MCG tablet Take 1,000 mcg by mouth daily at 6 (six) AM.    fluticasone (FLONASE) 50 MCG/ACT nasal spray USE 2 SPRAYS IN EACH NOSTRIL ONCE DAILY   hydrochlorothiazide (HYDRODIURIL) 25 MG tablet Take 1 tablet (25 mg total) by mouth daily.   ibuprofen (ADVIL) 800 MG tablet Take 1 tablet (800 mg total) by mouth 3 (three) times daily.   potassium chloride SA (KLOR-CON M) 20 MEQ tablet Take 1 tablet (20 mEq total) by mouth daily.   Vitamin D, Ergocalciferol, (DRISDOL) 1.25 MG (50000 UNIT) CAPS capsule Take 1 capsule (50,000 Units total) by mouth once a week.   No facility-administered encounter medications on file as of 11/21/2021.    ALLERGIES:  Allergies  Allergen Reactions   Ace Inhibitors Cough     PHYSICAL EXAM:  ECOG PERFORMANCE STATUS: 0 - Asymptomatic  There were no vitals filed for this visit. There were no vitals filed for this visit. Physical Exam Constitutional:      Appearance: Normal appearance. She is obese.  HENT:     Head: Normocephalic and atraumatic.     Mouth/Throat:     Mouth: Mucous membranes are moist.  Eyes:     Extraocular Movements: Extraocular movements intact.     Pupils: Pupils are equal, round, and reactive to light.  Cardiovascular:     Rate and Rhythm: Normal rate  and regular rhythm.     Pulses: Normal pulses.     Heart sounds: Normal heart sounds.  Pulmonary:     Effort: Pulmonary effort is normal.     Breath sounds: Normal breath sounds.  Chest:     Comments: Breast exam deferred - patient receives annual breast exams via PCP Abdominal:     General: Bowel sounds are normal.     Palpations: Abdomen is soft.     Tenderness: There is no abdominal tenderness.  Musculoskeletal:        General: No swelling.     Right lower leg: No edema.     Left lower leg: No edema.  Lymphadenopathy:  Cervical: No cervical adenopathy.  Skin:    General: Skin is warm and dry.  Neurological:     General: No focal deficit present.     Mental Status: She is alert and oriented to person, place, and time.  Psychiatric:        Mood and Affect: Mood normal.        Behavior: Behavior normal.     LABORATORY DATA:  I have reviewed the labs as listed.  CBC    Component Value Date/Time   WBC 8.1 10/19/2021 1038   RBC 4.56 10/19/2021 1038   HGB 13.3 10/19/2021 1038   HGB 13.7 04/09/2017 1445   HCT 41.7 10/19/2021 1038   HCT 39.9 04/09/2017 1445   PLT 461 (H) 10/19/2021 1038   PLT 478 (H) 04/09/2017 1445   MCV 91.4 10/19/2021 1038   MCV 88 04/09/2017 1445   MCH 29.2 10/19/2021 1038   MCHC 31.9 10/19/2021 1038   RDW 14.3 10/19/2021 1038   RDW 14.9 04/09/2017 1445   LYMPHSABS 3.2 10/19/2021 1038   LYMPHSABS 4.5 (H) 04/09/2017 1445   MONOABS 0.5 10/19/2021 1038   EOSABS 0.2 10/19/2021 1038   EOSABS 0.2 04/09/2017 1445   BASOSABS 0.0 10/19/2021 1038   BASOSABS 0.1 04/09/2017 1445   CMP Latest Ref Rng & Units 10/19/2021 09/05/2021 04/11/2021  Glucose 70 - 99 mg/dL 87 70 89  BUN 6 - 20 mg/dL _0 Creatinine 0.44 - 1.00 mg/dL 0.56 0.62 0.55  Sodium 135 - 145 mmol/L 140 141 139  Potassium 3.5 - 5.1 mmol/L 3.6 4.4 3.7  Chloride 98 - 111 mmol/L 105 101 101  CO2 22 - 32 mmol/L _1 Calcium 8.9 - 10.3 mg/dL 9.7 9.8 9.8  Total Protein 6.5 - 8.1 g/dL  7.7 7.0 7.8  Total Bilirubin 0.3 - 1.2 mg/dL 0.8 0.4 0.8  Alkaline Phos 38 - 126 U/L 59 - 67  AST 15 - 41 U/L 17 9(L) 15  ALT 0 - 44 U/L _2 DIAGNOSTIC IMAGING:  I have independently reviewed the relevant imaging and discussed with the patient.  ASSESSMENT & PLAN: 1.  Thrombocytosis: - Mild thrombocytosis (platelets 400-500) since at least 2008 - Myeloproliferative work-up negative for JAK2/exon 12/CALR/MPL. - She smokes 3 cigarettes/day - No connective tissue, autoimmune, or rheumatoid disease - She is obese with BMI 33 - Denies any vasomotor symptoms like erythromelalgia or aquagenic pruritus.  - Most recent labs (10/19/2021): Platelets 461, otherwise normal CBC.  Ferritin 113, iron saturation 22%. - Her platelet count over the last several years has been stable. - Suspect reactive thrombocytosis in the setting of tobacco use and obesity. - PLAN: Recommend to continued observation without intervention.  If any significant change in the future, we will consider bone marrow aspiration and biopsy. - Repeat labs and RTC in 6 months.  We will check ESR and CRP with next labs.   2.  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  She did not receive antiestrogen therapy. - Most recent mammogram (06/17/2021): No mammographic evidence of malignancy in either breast, BI-RADS Category 1, negative - No symptoms concerning for recurrent breast cancer at this time - Breast exam deferred, as patient receives annual  breast exams via PCP - Labs (10/19/2021) show unremarkable CBC and CMP, nonconcerning for recurrent malignancy.  Normal LDH and vitamin D. - PLAN: Continue annual mammograms as ordered by PCP.  Continue annual breast exams with  PCP.   PLAN SUMMARY & DISPOSITION: Labs and RTC in 6 months.  All questions were answered. The patient knows to call the clinic with any problems, questions or concerns.  Medical decision making: Moderate  Time spent on visit: I spent 20 minutes counseling the patient face to face. The total time spent in the appointment was 30 minutes and more than 50% was on counseling.   Harriett Rush, PA-C  11/21/2021 9 AM

## 2021-11-21 ENCOUNTER — Other Ambulatory Visit: Payer: Self-pay

## 2021-11-21 ENCOUNTER — Inpatient Hospital Stay (HOSPITAL_COMMUNITY): Payer: No Typology Code available for payment source | Attending: Physician Assistant | Admitting: Physician Assistant

## 2021-11-21 VITALS — BP 133/76 | HR 87 | Temp 97.8°F | Resp 18 | Ht 62.0 in | Wt 181.2 lb

## 2021-11-21 DIAGNOSIS — D75839 Thrombocytosis, unspecified: Secondary | ICD-10-CM | POA: Insufficient documentation

## 2021-11-21 DIAGNOSIS — Z853 Personal history of malignant neoplasm of breast: Secondary | ICD-10-CM | POA: Insufficient documentation

## 2021-11-21 NOTE — Patient Instructions (Signed)
[place] at [facility] Discharge Instructions  You were seen today by Tarri Abernethy PA-C for your elevated platelets.  Your mildly elevated platelets may be due to inflammation from cigarette smoking and obesity.  We will continue to monitor with repeat labs in 6 months.  LABS: Return in 6 months   OTHER TESTS: None at this time  MEDICATIONS: No changes  FOLLOW-UP APPOINTMENT: Office visit in 6 months   Thank you for choosing [place] at [facility] to provide your oncology and hematology care.  To afford each patient quality time with our provider, please arrive at least 15 minutes before your scheduled appointment time.   If you have a lab appointment with the [facility] please come in thru the Main Entrance and check in at the main information desk.  You need to re-schedule your appointment should you arrive 10 or more minutes late.  We strive to give you quality time with our providers, and arriving late affects you and other patients whose appointments are after yours.  Also, if you no show three or more times for appointments you may be dismissed from the clinic at the providers discretion.     Again, thank you for choosing [facility].  Our hope is that these requests will decrease the amount of time that you wait before being seen by our physicians.       _____________________________________________________________  Should you have questions after your visit to [facility], please contact our office at [phone] and follow the prompts.  Our office hours are 8:00 a.m. and 4:30 p.m. Monday - Friday.  Please note that voicemails left after 4:00 p.m. may not be returned until the following business day.  We are closed weekends and major holidays.  You do have access to a nurse 24-7, just call the main number to the clinic [phone] and do not press any options, hold on the line and a nurse will  answer the phone.    For prescription refill requests, have your pharmacy contact our office and allow 72 hours.    Due to Covid, you will need to wear a mask upon entering the hospital. If you do not have a mask, a mask will be given to you at the Main Entrance upon arrival. For doctor visits, patients may have 1 support person age 87 or older with them. For treatment visits, patients can not have anyone with them due to social distancing guidelines and our immunocompromised population.

## 2021-11-22 ENCOUNTER — Other Ambulatory Visit (HOSPITAL_COMMUNITY): Payer: Self-pay

## 2021-12-15 ENCOUNTER — Ambulatory Visit (INDEPENDENT_AMBULATORY_CARE_PROVIDER_SITE_OTHER): Payer: No Typology Code available for payment source | Admitting: Family Medicine

## 2021-12-15 ENCOUNTER — Other Ambulatory Visit: Payer: Self-pay

## 2021-12-15 ENCOUNTER — Encounter: Payer: Self-pay | Admitting: Family Medicine

## 2021-12-15 ENCOUNTER — Other Ambulatory Visit (HOSPITAL_COMMUNITY): Payer: Self-pay

## 2021-12-15 VITALS — BP 114/72 | HR 87 | Ht 62.0 in | Wt 186.0 lb

## 2021-12-15 DIAGNOSIS — M25512 Pain in left shoulder: Secondary | ICD-10-CM

## 2021-12-15 DIAGNOSIS — M25511 Pain in right shoulder: Secondary | ICD-10-CM | POA: Diagnosis not present

## 2021-12-15 DIAGNOSIS — M542 Cervicalgia: Secondary | ICD-10-CM

## 2021-12-15 DIAGNOSIS — Z23 Encounter for immunization: Secondary | ICD-10-CM | POA: Diagnosis not present

## 2021-12-15 DIAGNOSIS — E559 Vitamin D deficiency, unspecified: Secondary | ICD-10-CM

## 2021-12-15 DIAGNOSIS — E7849 Other hyperlipidemia: Secondary | ICD-10-CM | POA: Diagnosis not present

## 2021-12-15 DIAGNOSIS — F17218 Nicotine dependence, cigarettes, with other nicotine-induced disorders: Secondary | ICD-10-CM

## 2021-12-15 DIAGNOSIS — R7303 Prediabetes: Secondary | ICD-10-CM

## 2021-12-15 DIAGNOSIS — I10 Essential (primary) hypertension: Secondary | ICD-10-CM

## 2021-12-15 DIAGNOSIS — G8929 Other chronic pain: Secondary | ICD-10-CM

## 2021-12-15 MED ORDER — PREDNISONE 20 MG PO TABS
20.0000 mg | ORAL_TABLET | Freq: Two times a day (BID) | ORAL | 0 refills | Status: DC
  Filled 2021-12-15: qty 14, 7d supply, fill #0

## 2021-12-15 MED ORDER — GABAPENTIN 100 MG PO CAPS
ORAL_CAPSULE | ORAL | 1 refills | Status: DC
  Filled 2021-12-15: qty 60, 30d supply, fill #0

## 2021-12-15 NOTE — Patient Instructions (Addendum)
F/Un in 2 months, re evaluate neck and arm pain, call if you need me sooner ? ?TdAP today ? ?X ray of neck as soon as possible ? ?New med for neck pain, short course of prednisone and bedtime gabapentin ?I may  also need to refer you to PT ? ?Fasting lipid, cmp and EGFr, HBA1C, Vit D 1 week before next appointment  ? ?Please  plan to quit smoking , best for your health ? ?Congrats on healthy weight loss, keep it up! ? ?Thanks for choosing [facility], we consider it a privelige to serve you. ? ?

## 2021-12-15 NOTE — Progress Notes (Signed)
? ?[name] [name]     MRN: [id number]      DOB: 1960/12/29 ? ? ?HPI ?Ms. Lizotte is here for follow up and re-evaluation of chronic medical conditions, medication management and review of any available recent lab and radiology data.  ?Preventive health is updated, specifically  Cancer screening and Immunization.   ?Questions or concerns regarding consultations or procedures which the PT has had in the interim are  addressed. ?The PT denies any adverse reactions to current medications since the last visit.  ?Approx 6 month h/o shoulder and upper arm pain awaking pt worse in past  1 month ? ?ROS ?Denies recent fever or chills. ?Denies sinus pressure, nasal congestion, ear pain or sore throat. ?Denies chest congestion, productive cough or wheezing. ?Denies chest pains, palpitations and leg swelling ?Denies abdominal pain, nausea, vomiting,diarrhea or constipation.   ?Denies dysuria, frequency, hesitancy or incontinence. ? ?Denies headaches, seizures, numbness, or tingling. ?Denies depression, anxiety or insomnia. ?Denies skin break down or rash. ? ? ?PE ? ?BP 114/72 (BP Location: Left Arm)   Pulse 87   Ht '5\' 2"'$  (1.575 m)   Wt 186 lb (84.4 kg)   SpO2 97%   BMI 34.02 kg/m?  ? ?Patient alert and oriented and in no cardiopulmonary distress. ? ?HEENT: No facial asymmetry, EOMI,     Neck decreased ROM ? ?Chest: Clear to auscultation bilaterally. ? ?CVS: S1, S2 no murmurs, no S3.Regular rate. ? ?ABD: Soft non tender.  ? ?Ext: No edema ? ?MS: Adequate ROM spine, shoulders, hips and knees. ? ?Skin: Intact, no ulcerations or rash noted. ? ?Psych: Good eye contact, normal affect. Memory intact not anxious or depressed appearing. ? ?CNS: CN 2-12 intact, power,  normal throughout.no focal deficits noted. ? ? ?Assessment & Plan ? ?Essential hypertension ?Controlled, no change in medication ?DASH diet and commitment to daily physical activity for a minimum of 30 minutes discussed and encouraged, as a part of hypertension  management. ?The importance of attaining a healthy weight is also discussed. ? ? ?  12/15/2021  ?  3:28 PM 11/21/2021  ?  8:25 AM 08/23/2021  ?  3:29 PM 05/17/2021  ?  9:07 AM 04/18/2021  ? 11:49 AM 01/05/2021  ?  9:06 AM 08/09/2020  ? 10:08 AM  ?BP/Weight  ?Systolic BP 034 742 595 638 134  123  ?Diastolic BP 72 76 84 80 85  80  ?Wt. (Lbs) 186 181.2 193.08 194 193.4 186 196  ?BMI 34.02 kg/m2 33.14 kg/m2 35.31 kg/m2 35.48 kg/m2 35.37 kg/m2 34.02 kg/m2 35.85 kg/m2  ? ? ? ? ? ?Morbid obesity ([place]) ? ?Patient re-educated about  the importance of commitment to a  minimum of 150 minutes of exercise per week as able. ? ?The importance of healthy food choices with portion control discussed, as well as eating regularly and within a 12 hour window most days. ?The need to choose "clean , green" food 50 to 75% of the time is discussed, as well as to make water the primary drink and set a goal of 64 ounces water daily. ? ?  ? ?  12/15/2021  ?  3:28 PM 11/21/2021  ?  8:25 AM 08/23/2021  ?  3:29 PM  ?Weight /BMI  ?Weight 186 lb 181 lb 3.2 oz 193 lb 1.3 oz  ?Height '5\' 2"'$  (1.575 m) '5\' 2"'$  (1.575 m) '5\' 2"'$  (1.575 m)  ?BMI 34.02 kg/m2 33.14 kg/m2 35.31 kg/m2  ? ? ? ? ?Nicotine dependence ?Asked:confirms currently smokes cigarettes  approx 3 /day ?Assess: Unwilling to set a quit date, but is cutting back ?Advise: needs to QUIT to reduce risk of cancer, cardio and cerebrovascular disease ?Assist: counseled for 5 minutes and literature provided ?Arrange: follow up in 2 to 4 months ? ? ?Hyperlipemia ?Hyperlipidemia:Low fat diet discussed and encouraged. ? ? ?Lipid Panel  ?Lab Results  ?Component Value Date  ? CHOL 195 09/05/2021  ? HDL 50 09/05/2021  ? LDLCALC 124 (H) 09/05/2021  ? TRIG 105 09/05/2021  ? CHOLHDL 3.9 09/05/2021  ?needs to reduce fat intake ? ? ? ? ?Bilateral shoulder pain ?Likely a result of pathology in C spine, [pt to have x ray, has full ROM both shoulders ?Short course of predniosne and bed time gabapentin, may need PT ? ?

## 2021-12-18 ENCOUNTER — Encounter: Payer: Self-pay | Admitting: Family Medicine

## 2021-12-18 NOTE — Assessment & Plan Note (Signed)
Controlled, no change in medication ?DASH diet and commitment to daily physical activity for a minimum of 30 minutes discussed and encouraged, as a part of hypertension management. ?The importance of attaining a healthy weight is also discussed. ? ? ?  12/15/2021  ?  3:28 PM 11/21/2021  ?  8:25 AM 08/23/2021  ?  3:29 PM 05/17/2021  ?  9:07 AM 04/18/2021  ? 11:49 AM 01/05/2021  ?  9:06 AM 08/09/2020  ? 10:08 AM  ?BP/Weight  ?Systolic BP 161 096 045 409 134  123  ?Diastolic BP 72 76 84 80 85  80  ?Wt. (Lbs) 186 181.2 193.08 194 193.4 186 196  ?BMI 34.02 kg/m2 33.14 kg/m2 35.31 kg/m2 35.48 kg/m2 35.37 kg/m2 34.02 kg/m2 35.85 kg/m2  ? ? ? ? ?

## 2021-12-18 NOTE — Assessment & Plan Note (Signed)
Hyperlipidemia:Low fat diet discussed and encouraged. ? ? ?Lipid Panel  ?Lab Results  ?Component Value Date  ? CHOL 195 09/05/2021  ? HDL 50 09/05/2021  ? LDLCALC 124 (H) 09/05/2021  ? TRIG 105 09/05/2021  ? CHOLHDL 3.9 09/05/2021  ?needs to reduce fat intake ? ? ? ?

## 2021-12-18 NOTE — Assessment & Plan Note (Signed)
Asked:confirms currently smokes cigarettes approx 3/ day Assess: Unwilling to set a quit date, but is cutting back Advise: needs to QUIT to reduce risk of cancer, cardio and cerebrovascular disease Assist: counseled for 5 minutes and literature provided Arrange: follow up in 2 to 4 months  

## 2021-12-18 NOTE — Assessment & Plan Note (Signed)
?  Patient re-educated about  the importance of commitment to a  minimum of 150 minutes of exercise per week as able. ? ?The importance of healthy food choices with portion control discussed, as well as eating regularly and within a 12 hour window most days. ?The need to choose "clean , green" food 50 to 75% of the time is discussed, as well as to make water the primary drink and set a goal of 64 ounces water daily. ? ?  ? ?  12/15/2021  ?  3:28 PM 11/21/2021  ?  8:25 AM 08/23/2021  ?  3:29 PM  ?Weight /BMI  ?Weight 186 lb 181 lb 3.2 oz 193 lb 1.3 oz  ?Height '5\' 2"'$  (1.575 m) '5\' 2"'$  (1.575 m) '5\' 2"'$  (1.575 m)  ?BMI 34.02 kg/m2 33.14 kg/m2 35.31 kg/m2  ? ? ? ?

## 2021-12-18 NOTE — Assessment & Plan Note (Addendum)
Likely a result of pathology in C spine, [pt to have x ray, has full ROM both shoulders ?Short course of predniosne and bed time gabapentin, may need PT ?

## 2021-12-21 ENCOUNTER — Ambulatory Visit: Payer: No Typology Code available for payment source | Admitting: Family Medicine

## 2021-12-29 ENCOUNTER — Other Ambulatory Visit (HOSPITAL_COMMUNITY): Payer: Self-pay

## 2022-01-28 ENCOUNTER — Other Ambulatory Visit: Payer: Self-pay | Admitting: Family Medicine

## 2022-01-28 DIAGNOSIS — I10 Essential (primary) hypertension: Secondary | ICD-10-CM

## 2022-01-30 ENCOUNTER — Other Ambulatory Visit (HOSPITAL_COMMUNITY): Payer: Self-pay

## 2022-01-30 MED ORDER — AMLODIPINE BESYLATE 2.5 MG PO TABS
2.5000 mg | ORAL_TABLET | Freq: Every day | ORAL | 1 refills | Status: DC
  Filled 2022-01-30: qty 90, 90d supply, fill #0
  Filled 2022-04-27: qty 90, 90d supply, fill #1

## 2022-01-30 MED ORDER — POTASSIUM CHLORIDE CRYS ER 20 MEQ PO TBCR
20.0000 meq | EXTENDED_RELEASE_TABLET | Freq: Every day | ORAL | 1 refills | Status: DC
  Filled 2022-01-30: qty 90, 90d supply, fill #0
  Filled 2022-04-27: qty 90, 90d supply, fill #1

## 2022-01-31 ENCOUNTER — Emergency Department (HOSPITAL_COMMUNITY)
Admission: EM | Admit: 2022-01-31 | Discharge: 2022-01-31 | Disposition: A | Discharge status: Home / Self Care | Payer: No Typology Code available for payment source | Attending: Emergency Medicine | Admitting: Emergency Medicine

## 2022-01-31 ENCOUNTER — Encounter (HOSPITAL_COMMUNITY): Payer: Self-pay | Admitting: Emergency Medicine

## 2022-01-31 ENCOUNTER — Emergency Department (HOSPITAL_COMMUNITY): Payer: No Typology Code available for payment source

## 2022-01-31 ENCOUNTER — Other Ambulatory Visit (HOSPITAL_COMMUNITY): Payer: Self-pay

## 2022-01-31 ENCOUNTER — Other Ambulatory Visit: Payer: Self-pay

## 2022-01-31 DIAGNOSIS — Z853 Personal history of malignant neoplasm of breast: Secondary | ICD-10-CM | POA: Diagnosis not present

## 2022-01-31 DIAGNOSIS — R0789 Other chest pain: Secondary | ICD-10-CM | POA: Diagnosis not present

## 2022-01-31 DIAGNOSIS — R079 Chest pain, unspecified: Secondary | ICD-10-CM | POA: Diagnosis present

## 2022-01-31 LAB — CBC
HCT: 40.7 % (ref 36.0–46.0)
Hemoglobin: 13.2 g/dL (ref 12.0–15.0)
MCH: 30 pg (ref 26.0–34.0)
MCHC: 32.4 g/dL (ref 30.0–36.0)
MCV: 92.5 fL (ref 80.0–100.0)
Platelets: 476 10*3/uL — ABNORMAL HIGH (ref 150–400)
RBC: 4.4 MIL/uL (ref 3.87–5.11)
RDW: 14.5 % (ref 11.5–15.5)
WBC: 7.5 10*3/uL (ref 4.0–10.5)
nRBC: 0 % (ref 0.0–0.2)

## 2022-01-31 LAB — D-DIMER, QUANTITATIVE: D-Dimer, Quant: 0.41 ug/mL-FEU (ref 0.00–0.50)

## 2022-01-31 LAB — COMPREHENSIVE METABOLIC PANEL
ALT: 14 U/L (ref 0–44)
AST: 15 U/L (ref 15–41)
Albumin: 3.9 g/dL (ref 3.5–5.0)
Alkaline Phosphatase: 67 U/L (ref 38–126)
Anion gap: 7 (ref 5–15)
BUN: 11 mg/dL (ref 6–20)
CO2: 27 mmol/L (ref 22–32)
Calcium: 9.6 mg/dL (ref 8.9–10.3)
Chloride: 107 mmol/L (ref 98–111)
Creatinine, Ser: 0.58 mg/dL (ref 0.44–1.00)
GFR, Estimated: >60 mL/min (ref >60)
Glucose, Bld: 99 mg/dL (ref 70–99)
Potassium: 3.3 mmol/L — ABNORMAL LOW (ref 3.5–5.1)
Sodium: 141 mmol/L (ref 135–145)
Total Bilirubin: 0.5 mg/dL (ref 0.3–1.2)
Total Protein: 7.4 g/dL (ref 6.5–8.1)

## 2022-01-31 LAB — TROPONIN I (HIGH SENSITIVITY)
Troponin I (High Sensitivity): 4 ng/L (ref <18)
Troponin I (High Sensitivity): 4 ng/L (ref <18)

## 2022-01-31 LAB — MAGNESIUM: Magnesium: 2.1 mg/dL (ref 1.7–2.4)

## 2022-01-31 MED ORDER — ASPIRIN 81 MG PO CHEW
324.0000 mg | CHEWABLE_TABLET | Freq: Once | ORAL | Status: AC
  Administered 2022-01-31: 324 mg via ORAL
  Filled 2022-01-31: qty 4

## 2022-01-31 MED ORDER — PREDNISONE 20 MG PO TABS
40.0000 mg | ORAL_TABLET | Freq: Every day | ORAL | 0 refills | Status: DC
  Filled 2022-01-31: qty 8, 4d supply, fill #0

## 2022-01-31 MED ORDER — PREDNISONE 20 MG PO TABS
40.0000 mg | ORAL_TABLET | Freq: Every day | ORAL | 0 refills | Status: AC
  Filled 2022-02-01: qty 8, 4d supply, fill #0

## 2022-01-31 NOTE — Discharge Instructions (Signed)
As discussed, your evaluation today has been largely reassuring.  But, it is important that you monitor your condition carefully, and do not hesitate to return to the ED if you develop new, or concerning changes in your condition. ? ?Otherwise, please follow-up with your physician for appropriate ongoing care. ? ?

## 2022-01-31 NOTE — ED Provider Notes (Signed)
?[facility] ?Provider Note ? ? ?CSN: [id number] ?Arrival date & time: 01/31/22  1204 ? ?  ? ?History ? ?Chief Complaint  ?Patient presents with  ? Chest Pain  ? ? ?[name] [name] is a 61 y.o. female. ? ?HPI ?Patient with multiple medical issues presents with right-sided chest pain.  History is notable for prior cancer, in the distant past, for which she has been cleared for more than a decade.  Onset was within the past days, pain is right-sided, focal, not exertional or pleuritic. ?  ? ?Home Medications ?Prior to Admission medications   ?Medication Sig Start Date End Date Taking? Authorizing Provider  ?amLODipine (NORVASC) 2.5 MG tablet Take 1 tablet (2.5 mg total) by mouth daily. 01/30/22   Fayrene Helper, MD  ?Biotin 1000 MCG tablet Take 1,000 mcg by mouth daily at 6 (six) AM.     [provider]  ?fluticasone (FLONASE) 50 MCG/ACT nasal spray USE 2 SPRAYS IN EACH NOSTRIL ONCE DAILY 01/26/20   Fayrene Helper, MD  ?gabapentin (NEURONTIN) 100 MG capsule Take one to two capsules by mouth at bedtime for pain 12/15/21   Fayrene Helper, MD  ?hydrochlorothiazide (HYDRODIURIL) 25 MG tablet Take 1 tablet (25 mg total) by mouth daily. 09/28/21   Fayrene Helper, MD  ?ibuprofen (ADVIL) 800 MG tablet Take 1 tablet (800 mg total) by mouth 3 (three) times daily. 09/28/21   Fayrene Helper, MD  ?potassium chloride SA (KLOR-CON M) 20 MEQ tablet Take 1 tablet (20 mEq total) by mouth daily. 01/30/22   Fayrene Helper, MD  ?predniSONE (DELTASONE) 20 MG tablet Take 2 tablets (40 mg total) by mouth daily with breakfast for 4 days. 01/31/22 02/04/22  Carmin Muskrat, MD  ?Vitamin D, Ergocalciferol, (DRISDOL) 1.25 MG (50000 UNIT) CAPS capsule Take 1 capsule (50,000 Units total) by mouth once a week. 05/26/21   Fayrene Helper, MD  ?   ? ?Allergies    ?Ace inhibitors   ? ?Review of Systems   ?Review of Systems  ?All other systems reviewed and are negative. ? ?Physical Exam ?Updated Vital  Signs ?BP 129/79   Pulse 79   Temp 98.7 ?F (37.1 ?C) (Oral)   Resp 18   Ht '5\' 2"'$  (1.575 m)   Wt 85.3 kg   SpO2 96%   BMI 34.39 kg/m?  ?Physical Exam ?Vitals and nursing note reviewed.  ?Constitutional:   ?   General: She is not in acute distress. ?   Appearance: She is well-developed.  ?HENT:  ?   Head: Normocephalic and atraumatic.  ?Eyes:  ?   Conjunctiva/sclera: Conjunctivae normal.  ?Cardiovascular:  ?   Rate and Rhythm: Normal rate and regular rhythm.  ?Pulmonary:  ?   Effort: Pulmonary effort is normal. No respiratory distress.  ?   Breath sounds: Normal breath sounds. No stridor.  ?Abdominal:  ?   General: There is no distension.  ?Skin: ?   General: Skin is warm and dry.  ?Neurological:  ?   Mental Status: She is alert and oriented to person, place, and time.  ?   Cranial Nerves: No cranial nerve deficit.  ?Psychiatric:     ?   Mood and Affect: Mood normal.  ? ? ?ED Results / Procedures / Treatments   ?Labs ?(all labs ordered are listed, but only abnormal results are displayed) ?Labs Reviewed  ?CBC - Abnormal; Notable for the following components:  ?    Result Value  ?  Platelets 476 (*)   ? All other components within normal limits  ?COMPREHENSIVE METABOLIC PANEL - Abnormal; Notable for the following components:  ? Potassium 3.3 (*)   ? All other components within normal limits  ?MAGNESIUM  ?D-DIMER, QUANTITATIVE  ?TROPONIN I (HIGH SENSITIVITY)  ?TROPONIN I (HIGH SENSITIVITY)  ? ? ?EKG ?EKG Interpretation ? ?Date/Time:  Tuesday Jan 31 2022 12:16:54 EDT ?Ventricular Rate:  80 ?PR Interval:  164 ?QRS Duration: 93 ?QT Interval:  390 ?QTC Calculation: 450 ?R Axis:   53 ?Text Interpretation: Sinus rhythm Abnormal R-wave progression, early transition Borderline ECG Confirmed by Carmin Muskrat [id number]) on 01/31/2022 12:17:36 PM ? ?Radiology ?DG Chest Portable 1 View ? ?Result Date: 01/31/2022 ?CLINICAL DATA:  Right chest pain EXAM: PORTABLE CHEST 1 VIEW COMPARISON:  03/05/2017 FINDINGS: Transverse diameter of  heart is slightly increased. There are no signs of pulmonary edema or focal pulmonary consolidation. There is crowding of vascular structures in the medial left lower lung fields. There is no pleural effusion or pneumothorax. Surgical clips are seen in the left axilla. IMPRESSION: There are no signs of pulmonary edema or focal pulmonary consolidation. Electronically Signed   By: Elmer Picker M.D.   On: 01/31/2022 12:47   ? ?Procedures ?Procedures  ? ? ?Medications Ordered in ED ?Medications  ?aspirin chewable tablet 324 mg (324 mg Oral Given 01/31/22 1228)  ? ? ?ED Course/ Medical Decision Making/ A&P ?This patient with a Hx of breast cancer and thrombocytosis presents to the ED for concern of chest pain, this involves an extensive number of treatment options, and is a complaint that carries with it a high risk of complications and morbidity.   ? ?The differential diagnosis includes ACS, PE, pneumonia ? ? ?Social Determinants of Health: ? ?Age, prior cancer ? ?Additional history obtained: ? ?Additional history and/or information obtained from chart review, notable for history of thrombocytosis, cancer, monitoring of thrombocytosis with oncology, steady values ? ? ?After the initial evaluation, orders, including: Labs x-ray were initiated. ? ? ?Patient placed on Cardiac and Pulse-Oximetry Monitors. ?The patient was maintained on a cardiac monitor.  The cardiac monitored showed an rhythm of 80 sinus normal ?The patient was also maintained on pulse oximetry. The readings were typically 100% room air normal ? ? ?On repeat evaluation of the patient improved ? ?Lab Tests: ? ?I personally interpreted labs.  The pertinent results include: No substantial thrombocytosis, 2 normal troponin ? ?Imaging Studies ordered: ? ?I independently visualized and interpreted imaging which showed no pneumonia ?I agree with the radiologist interpretation ? ?Dispostion / Final MDM: ? ?After consideration of the diagnostic results and the  patient's response to treatment, this adult female with multiple medical issues, including thrombosis, prior cancer presents with right-sided chest pain, not hypoxic, dyspneic, hypotensive, and with otherwise low risk profile for ACS, 2 normal troponin she is appropriate for discharge.  Patient amenable to this, started on anti-inflammatories. ? ?Final Clinical Impression(s) / ED Diagnoses ?Final diagnoses:  ?Atypical chest pain  ? ? ?Rx / DC Orders ?ED Discharge Orders   ? ?      Ordered  ?  predniSONE (DELTASONE) 20 MG tablet  Daily with breakfast,   Status:  Discontinued       ? 01/31/22 1517  ?  predniSONE (DELTASONE) 20 MG tablet  Daily with breakfast       ? 01/31/22 1517  ? ?  ?  ? ?  ? ? ?  ?Carmin Muskrat, MD ?01/31/22 1519 ? ?

## 2022-01-31 NOTE — ED Triage Notes (Signed)
Pt has c/o of right side chest pain, radiating to back x 1 day, works at [employer], does heavy lifting. ?

## 2022-01-31 NOTE — ED Notes (Signed)
X-ray at bedside

## 2022-02-01 ENCOUNTER — Other Ambulatory Visit (HOSPITAL_COMMUNITY): Payer: Self-pay

## 2022-02-13 ENCOUNTER — Ambulatory Visit (HOSPITAL_COMMUNITY)
Admission: RE | Admit: 2022-02-13 | Discharge: 2022-02-13 | Disposition: A | Discharge status: Home / Self Care | Payer: No Typology Code available for payment source | Source: Ambulatory Visit | Attending: Family Medicine | Admitting: Family Medicine

## 2022-02-13 DIAGNOSIS — M542 Cervicalgia: Secondary | ICD-10-CM | POA: Insufficient documentation

## 2022-02-14 LAB — CMP14+EGFR
ALT: 8 IU/L (ref 0–32)
AST: 10 IU/L (ref 0–40)
Albumin/Globulin Ratio: 1.7 (ref 1.2–2.2)
Albumin: 4.4 g/dL (ref 3.8–4.9)
Alkaline Phosphatase: 74 IU/L (ref 44–121)
BUN/Creatinine Ratio: 17 (ref 12–28)
BUN: 10 mg/dL (ref 8–27)
Bilirubin Total: 0.3 mg/dL (ref 0.0–1.2)
CO2: 25 mmol/L (ref 20–29)
Calcium: 10 mg/dL (ref 8.7–10.3)
Chloride: 103 mmol/L (ref 96–106)
Creatinine, Ser: 0.59 mg/dL (ref 0.57–1.00)
Globulin, Total: 2.6 g/dL (ref 1.5–4.5)
Glucose: 87 mg/dL (ref 70–99)
Potassium: 4.2 mmol/L (ref 3.5–5.2)
Sodium: 142 mmol/L (ref 134–144)
Total Protein: 7 g/dL (ref 6.0–8.5)
eGFR: 103 mL/min/{1.73_m2} (ref >59)

## 2022-02-14 LAB — LIPID PANEL
Chol/HDL Ratio: 3.1 ratio (ref 0.0–4.4)
Cholesterol, Total: 208 mg/dL — ABNORMAL HIGH (ref 100–199)
HDL: 67 mg/dL (ref >39)
LDL Chol Calc (NIH): 124 mg/dL — ABNORMAL HIGH (ref 0–99)
Triglycerides: 94 mg/dL (ref 0–149)
VLDL Cholesterol Cal: 17 mg/dL (ref 5–40)

## 2022-02-14 LAB — VITAMIN D 25 HYDROXY (VIT D DEFICIENCY, FRACTURES): Vit D, 25-Hydroxy: 35.7 ng/mL (ref 30.0–100.0)

## 2022-02-14 LAB — HEMOGLOBIN A1C
Est. average glucose Bld gHb Est-mCnc: 123 mg/dL
Hgb A1c MFr Bld: 5.9 % — ABNORMAL HIGH (ref 4.8–5.6)

## 2022-02-15 ENCOUNTER — Ambulatory Visit (INDEPENDENT_AMBULATORY_CARE_PROVIDER_SITE_OTHER): Payer: No Typology Code available for payment source | Admitting: Family Medicine

## 2022-02-15 ENCOUNTER — Encounter: Payer: Self-pay | Admitting: Family Medicine

## 2022-02-15 VITALS — BP 128/78 | HR 82 | Resp 16 | Ht 62.0 in | Wt 189.1 lb

## 2022-02-15 DIAGNOSIS — E559 Vitamin D deficiency, unspecified: Secondary | ICD-10-CM

## 2022-02-15 DIAGNOSIS — I10 Essential (primary) hypertension: Secondary | ICD-10-CM | POA: Diagnosis not present

## 2022-02-15 DIAGNOSIS — R7303 Prediabetes: Secondary | ICD-10-CM

## 2022-02-15 DIAGNOSIS — E7849 Other hyperlipidemia: Secondary | ICD-10-CM

## 2022-02-15 DIAGNOSIS — F17218 Nicotine dependence, cigarettes, with other nicotine-induced disorders: Secondary | ICD-10-CM

## 2022-02-15 DIAGNOSIS — M4722 Other spondylosis with radiculopathy, cervical region: Secondary | ICD-10-CM

## 2022-02-15 NOTE — Patient Instructions (Addendum)
Annual exam in 5 months, with pap, call if you need me sooner  Use tylenol 1 to 2 at bedtime for arthritic neck pain, or gabapentin that is prescribed  Fasting CBC, likpid, cmp and EGFR, hBA1c, TSH and vit D 1 week before next appointment  It is important that you exercise regularly at least 30 minutes 5 times a week. If you develop chest pain, have severe difficulty breathing, or feel very tired, stop exercising immediately and seek medical attention   Think about what you will eat, plan ahead. Choose " clean, green, fresh or frozen" over canned, processed or packaged foods which are more sugary, salty and fatty. 70 to 75% of food eaten should be vegetables and fruit. Three meals at set times with snacks allowed between meals, but they must be fruit or vegetables. Aim to eat over a 12 hour period , example 7 am to 7 pm, and STOP after  your last meal of the day. Drink water,generally about 64 ounces per day, no other drink is as healthy. Fruit juice is best enjoyed in a healthy way, by EATING the fruit.  Thanks for choosing [facility], we consider it a privelige to serve you.  

## 2022-02-20 ENCOUNTER — Encounter: Payer: Self-pay | Admitting: Family Medicine

## 2022-02-20 DIAGNOSIS — M47812 Spondylosis without myelopathy or radiculopathy, cervical region: Secondary | ICD-10-CM | POA: Insufficient documentation

## 2022-02-20 NOTE — Assessment & Plan Note (Signed)
  Patient re-educated about  the importance of commitment to a  minimum of 150 minutes of exercise per week as able.  The importance of healthy food choices with portion control discussed, as well as eating regularly and within a 12 hour window most days. The need to choose "clean , green" food 50 to 75% of the time is discussed, as well as to make water the primary drink and set a goal of 64 ounces water daily.       02/15/2022    4:14 PM 01/31/2022   12:17 PM 12/15/2021    3:28 PM  Weight /BMI  Weight 189 lb 1.9 oz 188 lb 186 lb  Height '5\' 2"'$  (1.575 m) '5\' 2"'$  (1.575 m) '5\' 2"'$  (1.575 m)  BMI 34.59 kg/m2 34.39 kg/m2 34.02 kg/m2

## 2022-02-20 NOTE — Assessment & Plan Note (Signed)
Asked:confirms currently smokes cigarettes °Assess: Unwilling to set a quit date, but is cutting back °Advise: needs to QUIT to reduce risk of cancer, cardio and cerebrovascular disease °Assist: counseled for 5 minutes and literature provided °Arrange: follow up in 2 to 4 months ° °

## 2022-02-20 NOTE — Assessment & Plan Note (Signed)
Controlled, no change in medication DASH diet and commitment to daily physical activity for a minimum of 30 minutes discussed and encouraged, as a part of hypertension management. The importance of attaining a healthy weight is also discussed.     02/15/2022    4:14 PM 01/31/2022    3:48 PM 01/31/2022    2:05 PM 01/31/2022   12:30 PM 01/31/2022   12:17 PM 01/31/2022   12:15 PM 12/15/2021    3:28 PM  BP/Weight  Systolic BP 779 390 300 923  300 762  Diastolic BP 78 79 79 81  80 72  Wt. (Lbs) 189.12    188  186  BMI 34.59 kg/m2    34.39 kg/m2  34.02 kg/m2

## 2022-02-20 NOTE — Progress Notes (Signed)
[name] [name]     MRN: [id number]      DOB: 16-Feb-1961   HPI [name] [name] is here for follow up and re-evaluation of chronic medical conditions, medication management and review of any available recent lab and radiology data.  Preventive health is updated, specifically  Cancer screening and Immunization.   Questions or concerns regarding consultations or procedures which the PT has had in the interim are  addressed. The PT denies any adverse reactions to current medications since the last visit.  C/o neck and upper extremity  pain worse at night, at times, awakens her ROS Denies recent fever or chills. Denies sinus pressure, nasal congestion, ear pain or sore throat. Denies chest congestion, productive cough or wheezing. Denies chest pains, palpitations and leg swelling Denies abdominal pain, nausea, vomiting,diarrhea or constipation.   Denies dysuria, frequency, hesitancy or incontinence.  Denies headaches, seizures, numbness, or tingling. Denies depression, anxiety or insomnia. Denies skin break down or rash.   PE  BP 128/78   Pulse 82   Resp 16   Ht '5\' 2"'$  (1.575 m)   Wt 189 lb 1.9 oz (85.8 kg)   SpO2 96%   BMI 34.59 kg/m   Patient alert and oriented and in no cardiopulmonary distress.  HEENT: No facial asymmetry, EOMI,     Neck decreased rom  Chest: Clear to auscultation bilaterally.  CVS: S1, S2 no murmurs, no S3.Regular rate.  ABD: Soft non tender.   Ext: No edema  MS: Adequate ROM spine, shoulders, hips and knees.  Skin: Intact, no ulcerations or rash noted.  Psych: Good eye contact, normal affect. Memory intact not anxious or depressed appearing.  CNS: CN 2-12 intact, power,  normal throughout.no focal deficits noted.   Assessment & Plan  Essential hypertension Controlled, no change in medication DASH diet and commitment to daily physical activity for a minimum of 30 minutes discussed and encouraged, as a part of hypertension management. The  importance of attaining a healthy weight is also discussed.     02/15/2022    4:14 PM 01/31/2022    3:48 PM 01/31/2022    2:05 PM 01/31/2022   12:30 PM 01/31/2022   12:17 PM 01/31/2022   12:15 PM 12/15/2021    3:28 PM  BP/Weight  Systolic BP 315 400 867 619  509 326  Diastolic BP 78 79 79 81  80 72  Wt. (Lbs) 189.12    188  186  BMI 34.59 kg/m2    34.39 kg/m2  34.02 kg/m2       Hyperlipemia Hyperlipidemia:Low fat diet discussed and encouraged.   Lipid Panel  Lab Results  Component Value Date   CHOL 208 (H) 02/13/2022   HDL 67 02/13/2022   LDLCALC 124 (H) 02/13/2022   TRIG 94 02/13/2022   CHOLHDL 3.1 02/13/2022     Needs to reduce fat in diet, uncontrolled  Nicotine dependence Asked:confirms currently smokes cigarettes Assess: Unwilling to set a quit date, but is cutting back Advise: needs to QUIT to reduce risk of cancer, cardio and cerebrovascular disease Assist: counseled for 5 minutes and literature provided Arrange: follow up in 2 to 4 months   Morbid obesity ([place])  Patient re-educated about  the importance of commitment to a  minimum of 150 minutes of exercise per week as able.  The importance of healthy food choices with portion control discussed, as well as eating regularly and within a 12 hour window most days. The need to choose "clean , green" food  50 to 75% of the time is discussed, as well as to make water the primary drink and set a goal of 64 ounces water daily.       02/15/2022    4:14 PM 01/31/2022   12:17 PM 12/15/2021    3:28 PM  Weight /BMI  Weight 189 lb 1.9 oz 188 lb 186 lb  Height '5\' 2"'$  (1.575 m) '5\' 2"'$  (1.575 m) '5\' 2"'$  (1.575 m)  BMI 34.59 kg/m2 34.39 kg/m2 34.02 kg/m2      Prediabetes Patient educated about the importance of limiting  Carbohydrate intake , the need to commit to daily physical activity for a minimum of 30 minutes , and to commit weight loss. The fact that changes in all these areas will reduce or eliminate all together the  development of diabetes is stressed.      Latest Ref Rng & Units 02/13/2022   11:01 AM 01/31/2022   12:38 PM 10/19/2021   10:38 AM 09/05/2021    1:38 PM 04/11/2021    9:56 AM  Diabetic Labs  HbA1c 4.8 - 5.6 % 5.9     5.8     Chol 100 - 199 mg/dL 208     195     HDL >39 mg/dL 67     50     Calc LDL 0 - 99 mg/dL 124     124     Triglycerides 0 - 149 mg/dL 94     105     Creatinine 0.57 - 1.00 mg/dL 0.59   0.58   0.56   0.62   0.55        02/15/2022    4:14 PM 01/31/2022    3:48 PM 01/31/2022    2:05 PM 01/31/2022   12:30 PM 01/31/2022   12:17 PM 01/31/2022   12:15 PM 12/15/2021    3:28 PM  BP/Weight  Systolic BP 219 758 832 549  826 415  Diastolic BP 78 79 79 81  80 72  Wt. (Lbs) 189.12    188  186  BMI 34.59 kg/m2    34.39 kg/m2  34.02 kg/m2       View : No data to display.            DJD (degenerative joint disease) of cervical spine cauding significant shoulder and upper ext pain though mild on x ray, commit to bedtime gabapentin a or tylenol or both Neck exercise and warm compress

## 2022-02-20 NOTE — Assessment & Plan Note (Signed)
cauding significant shoulder and upper ext pain though mild on x ray, commit to bedtime gabapentin a or tylenol or both Neck exercise and warm compress

## 2022-02-20 NOTE — Assessment & Plan Note (Signed)
Patient educated about the importance of limiting  Carbohydrate intake , the need to commit to daily physical activity for a minimum of 30 minutes , and to commit weight loss. The fact that changes in all these areas will reduce or eliminate all together the development of diabetes is stressed.      Latest Ref Rng & Units 02/13/2022   11:01 AM 01/31/2022   12:38 PM 10/19/2021   10:38 AM 09/05/2021    1:38 PM 04/11/2021    9:56 AM  Diabetic Labs  HbA1c 4.8 - 5.6 % 5.9     5.8     Chol 100 - 199 mg/dL 208     195     HDL >39 mg/dL 67     50     Calc LDL 0 - 99 mg/dL 124     124     Triglycerides 0 - 149 mg/dL 94     105     Creatinine 0.57 - 1.00 mg/dL 0.59   0.58   0.56   0.62   0.55        02/15/2022    4:14 PM 01/31/2022    3:48 PM 01/31/2022    2:05 PM 01/31/2022   12:30 PM 01/31/2022   12:17 PM 01/31/2022   12:15 PM 12/15/2021    3:28 PM  BP/Weight  Systolic BP 741 638 453 646  803 212  Diastolic BP 78 79 79 81  80 72  Wt. (Lbs) 189.12    188  186  BMI 34.59 kg/m2    34.39 kg/m2  34.02 kg/m2       View : No data to display.

## 2022-02-20 NOTE — Assessment & Plan Note (Signed)
Hyperlipidemia:Low fat diet discussed and encouraged.   Lipid Panel  Lab Results  Component Value Date   CHOL 208 (H) 02/13/2022   HDL 67 02/13/2022   LDLCALC 124 (H) 02/13/2022   TRIG 94 02/13/2022   CHOLHDL 3.1 02/13/2022     Needs to reduce fat in diet, uncontrolled

## 2022-03-28 ENCOUNTER — Other Ambulatory Visit: Payer: Self-pay | Admitting: Family Medicine

## 2022-03-28 DIAGNOSIS — I10 Essential (primary) hypertension: Secondary | ICD-10-CM

## 2022-03-29 ENCOUNTER — Other Ambulatory Visit (HOSPITAL_COMMUNITY): Payer: Self-pay

## 2022-03-29 ENCOUNTER — Other Ambulatory Visit: Payer: Self-pay | Admitting: Family Medicine

## 2022-03-29 MED ORDER — IBUPROFEN 800 MG PO TABS
800.0000 mg | ORAL_TABLET | Freq: Three times a day (TID) | ORAL | 0 refills | Status: DC
  Filled 2022-03-29: qty 21, 7d supply, fill #0

## 2022-03-29 MED ORDER — HYDROCHLOROTHIAZIDE 25 MG PO TABS
25.0000 mg | ORAL_TABLET | Freq: Every day | ORAL | 1 refills | Status: DC
  Filled 2022-03-29: qty 90, 90d supply, fill #0
  Filled 2022-06-26: qty 90, 90d supply, fill #1

## 2022-04-24 ENCOUNTER — Encounter: Payer: Self-pay | Admitting: Internal Medicine

## 2022-04-24 ENCOUNTER — Other Ambulatory Visit (HOSPITAL_COMMUNITY): Payer: Self-pay

## 2022-04-24 ENCOUNTER — Ambulatory Visit (INDEPENDENT_AMBULATORY_CARE_PROVIDER_SITE_OTHER): Payer: No Typology Code available for payment source | Admitting: Internal Medicine

## 2022-04-24 VITALS — BP 118/86 | HR 91 | Resp 18 | Ht 62.0 in | Wt 190.2 lb

## 2022-04-24 DIAGNOSIS — M79675 Pain in left toe(s): Secondary | ICD-10-CM

## 2022-04-24 DIAGNOSIS — M79671 Pain in right foot: Secondary | ICD-10-CM

## 2022-04-24 MED ORDER — IBUPROFEN 800 MG PO TABS
800.0000 mg | ORAL_TABLET | Freq: Three times a day (TID) | ORAL | 0 refills | Status: DC
  Filled 2022-04-24: qty 30, 10d supply, fill #0

## 2022-04-24 NOTE — Patient Instructions (Signed)
Please take Ibuprofen as needed for foot pain.  Apply ice over right foot area to help with pain.

## 2022-04-28 ENCOUNTER — Other Ambulatory Visit (HOSPITAL_COMMUNITY): Payer: Self-pay

## 2022-04-28 NOTE — Progress Notes (Signed)
Acute Office Visit  Subjective:    Patient ID: [name] [name], female    DOB: Jun 18, 1961, 61 y.o.   MRN: [id number]  Chief Complaint  Patient presents with   Foot Pain    Patient having feet pain left big toe pain has been throbbing this started 04-17-22 and right outside foot feels like she is walking on a bone    HPI Patient is in today for complaint of left big toe pain for the last 1 week, which is constant, worse with walking and feels like she is walking on a bone.  She denies any numbness or tingling of the foot.  Denies any calf pain.  Has not noticed any swelling near great toe.  Denies any recent injury.  She also reports right foot pain near the lateral border of the arch.  Does not report any twisting injury.  Past Medical History:  Diagnosis Date   Adenocarcinoma of breast ([place])    left    Chronic neck pain 02/10/2016   Heel spur    Hyperlipidemia    Hypertension    Migraines    Obesity     Past Surgical History:  Procedure Laterality Date   BREAST SURGERY     btl  1992   CHOLECYSTECTOMY     COLONOSCOPY  09/15/2011   Procedure: COLONOSCOPY;  Surgeon: Dorothyann Peng, MD;  Location: [facility];  Service: Endoscopy;  Laterality: N/A;  9:15 AM   COLONOSCOPY N/A 08/16/2018   Procedure: COLONOSCOPY;  Surgeon: Danie Binder, MD;  Location: [facility];  Service: Endoscopy;  Laterality: N/A;  3:00   left lumpectomy and lymph node disection  2000   POLYPECTOMY  08/16/2018   Procedure: POLYPECTOMY;  Surgeon: Danie Binder, MD;  Location: [facility];  Service: Endoscopy;;  ascending colon(CSx3), descending colon(CSx1)   r heel surgery for spur and chipped bone  11/09/20099   TUBAL LIGATION      Family History  Problem Relation Age of Onset   Heart attack Mother    Heart attack Father    Glaucoma Father    Hypertension Father    Diabetes Sister    Anesthesia problems Neg Hx    Hypotension Neg Hx    Malignant hyperthermia Neg Hx    Pseudochol  deficiency Neg Hx    Migraines Neg Hx     Social History   Socioeconomic History   Marital status: Married    Spouse name: Not on file   Number of children: Not on file   Years of education: Not on file   Highest education level: Not on file  Occupational History   Not on file  Tobacco Use   Smoking status: Every Day    Packs/day: 0.25    Years: 15.00    Total pack years: 3.75    Types: Cigarettes   Smokeless tobacco: Never   Tobacco comments:    smoking 3 per day  Vaping Use   Vaping Use: Never used  Substance and Sexual Activity   Alcohol use: No    Alcohol/week: 0.0 standard drinks of alcohol   Drug use: No    Comment: pt states "3 cigarrettes per day"   Sexual activity: Yes    Birth control/protection: Post-menopausal  Other Topics Concern   Not on file  Social History Narrative   Not on file   Social Determinants of Health   Financial Resource Strain: Not on file  Food Insecurity: Not on file  Transportation Needs: Not on file  Physical Activity: Not on file  Stress: Not on file  Social Connections: Not on file  Intimate Partner Violence: Not on file    Outpatient Medications Prior to Visit  Medication Sig Dispense Refill   amLODipine (NORVASC) 2.5 MG tablet Take 1 tablet (2.5 mg total) by mouth daily. 90 tablet 1   Biotin 1000 MCG tablet Take 1,000 mcg by mouth daily at 6 (six) AM.      fluticasone (FLONASE) 50 MCG/ACT nasal spray USE 2 SPRAYS IN EACH NOSTRIL ONCE DAILY 48 g 1   gabapentin (NEURONTIN) 100 MG capsule Take one to two capsules by mouth at bedtime for pain 60 capsule 1   hydrochlorothiazide (HYDRODIURIL) 25 MG tablet Take 1 tablet (25 mg total) by mouth daily. 90 tablet 1   potassium chloride SA (KLOR-CON M) 20 MEQ tablet Take 1 tablet (20 mEq total) by mouth daily. 90 tablet 1   Vitamin D, Ergocalciferol, (DRISDOL) 1.25 MG (50000 UNIT) CAPS capsule Take 1 capsule (50,000 Units total) by mouth once a week. 16 capsule 3   ibuprofen (ADVIL)  800 MG tablet Take 1 tablet (800 mg total) by mouth 3 (three) times daily. 21 tablet 0   No facility-administered medications prior to visit.    Allergies  Allergen Reactions   Ace Inhibitors Cough    Review of Systems  Constitutional:  Negative for chills and fever.  Respiratory:  Negative for cough and shortness of breath.   Cardiovascular:  Negative for chest pain and palpitations.  Gastrointestinal:  Negative for diarrhea, nausea and vomiting.  Musculoskeletal:  Negative for neck pain and neck stiffness.       Left toe pain, right foot pain  Skin:  Negative for rash.  Neurological:  Negative for dizziness and weakness.  Psychiatric/Behavioral:  Negative for agitation and behavioral problems.        Objective:    Physical Exam Vitals reviewed.  Constitutional:      General: She is not in acute distress.    Appearance: She is obese. She is not diaphoretic.  HENT:     Head: Normocephalic and atraumatic.     Nose: Nose normal.     Mouth/Throat:     Mouth: Mucous membranes are moist.  Eyes:     General: No scleral icterus.    Extraocular Movements: Extraocular movements intact.  Cardiovascular:     Rate and Rhythm: Normal rate and regular rhythm.     Heart sounds: Normal heart sounds. No murmur heard. Pulmonary:     Breath sounds: Normal breath sounds. No wheezing or rales.  Musculoskeletal:     Cervical back: Neck supple. No tenderness.     Right lower leg: No edema.     Left lower leg: No edema.  Skin:    General: Skin is warm.     Findings: No rash.  Neurological:     General: No focal deficit present.     Mental Status: She is alert and oriented to person, place, and time.  Psychiatric:        Mood and Affect: Mood normal.        Behavior: Behavior normal.     BP 118/86 (BP Location: Right Arm, Patient Position: Sitting, Cuff Size: Normal)   Pulse 91   Resp 18   Ht '5\' 2"'$  (1.575 m)   Wt 190 lb 3.2 oz (86.3 kg)   SpO2 97%   BMI 34.79 kg/m  Wt  Readings from Last 3  Encounters:  04/24/22 190 lb 3.2 oz (86.3 kg)  02/15/22 189 lb 1.9 oz (85.8 kg)  01/31/22 188 lb (85.3 kg)        Assessment & Plan:   Problem List Items Addressed This Visit    Visit Diagnoses     Pain in left toe(s)    -  Primary Unclear etiology, less likely PAD No visible swelling, but a common site for gout Ibuprofen PRN for pain Referred to Podiatry   Relevant Medications   ibuprofen (ADVIL) 800 MG tablet   Other Relevant Orders   Ambulatory referral to Podiatry   Right foot pain     Could be plantar fasciitis Advised leg elevation and ice Ibuprofen as needed for pain   Relevant Medications   ibuprofen (ADVIL) 800 MG tablet   Other Relevant Orders   Ambulatory referral to Podiatry        Meds ordered this encounter  Medications   ibuprofen (ADVIL) 800 MG tablet    Sig: Take 1 tablet (800 mg total) by mouth 3 (three) times daily.    Dispense:  30 tablet    Refill:  0     Aarionna Germer Keith Rake, MD

## 2022-05-15 ENCOUNTER — Inpatient Hospital Stay: Payer: No Typology Code available for payment source | Attending: Physician Assistant

## 2022-05-15 DIAGNOSIS — F17210 Nicotine dependence, cigarettes, uncomplicated: Secondary | ICD-10-CM | POA: Insufficient documentation

## 2022-05-15 DIAGNOSIS — Z853 Personal history of malignant neoplasm of breast: Secondary | ICD-10-CM | POA: Diagnosis not present

## 2022-05-15 DIAGNOSIS — Z08 Encounter for follow-up examination after completed treatment for malignant neoplasm: Secondary | ICD-10-CM | POA: Diagnosis not present

## 2022-05-15 DIAGNOSIS — E669 Obesity, unspecified: Secondary | ICD-10-CM | POA: Diagnosis not present

## 2022-05-15 DIAGNOSIS — D75839 Thrombocytosis, unspecified: Secondary | ICD-10-CM | POA: Insufficient documentation

## 2022-05-15 DIAGNOSIS — Z6833 Body mass index (BMI) 33.0-33.9, adult: Secondary | ICD-10-CM | POA: Diagnosis not present

## 2022-05-15 LAB — COMPREHENSIVE METABOLIC PANEL
ALT: 14 U/L (ref 0–44)
AST: 14 U/L — ABNORMAL LOW (ref 15–41)
Albumin: 3.9 g/dL (ref 3.5–5.0)
Alkaline Phosphatase: 66 U/L (ref 38–126)
Anion gap: 5 (ref 5–15)
BUN: 15 mg/dL (ref 6–20)
CO2: 28 mmol/L (ref 22–32)
Calcium: 9.2 mg/dL (ref 8.9–10.3)
Chloride: 104 mmol/L (ref 98–111)
Creatinine, Ser: 0.39 mg/dL — ABNORMAL LOW (ref 0.44–1.00)
GFR, Estimated: >60 mL/min (ref >60)
Glucose, Bld: 91 mg/dL (ref 70–99)
Potassium: 3.6 mmol/L (ref 3.5–5.1)
Sodium: 137 mmol/L (ref 135–145)
Total Bilirubin: 0.3 mg/dL (ref 0.3–1.2)
Total Protein: 7.6 g/dL (ref 6.5–8.1)

## 2022-05-15 LAB — CBC WITH DIFFERENTIAL/PLATELET
Abs Immature Granulocytes: 0.01 10*3/uL (ref 0.00–0.07)
Basophils Absolute: 0.1 10*3/uL (ref 0.0–0.1)
Basophils Relative: 1 %
Eosinophils Absolute: 0.2 10*3/uL (ref 0.0–0.5)
Eosinophils Relative: 3 %
HCT: 42.9 % (ref 36.0–46.0)
Hemoglobin: 14.1 g/dL (ref 12.0–15.0)
Immature Granulocytes: 0 %
Lymphocytes Relative: 41 %
Lymphs Abs: 2.7 10*3/uL (ref 0.7–4.0)
MCH: 30.6 pg (ref 26.0–34.0)
MCHC: 32.9 g/dL (ref 30.0–36.0)
MCV: 93.1 fL (ref 80.0–100.0)
Monocytes Absolute: 0.5 10*3/uL (ref 0.1–1.0)
Monocytes Relative: 7 %
Neutro Abs: 3.2 10*3/uL (ref 1.7–7.7)
Neutrophils Relative %: 48 %
Platelets: 438 10*3/uL — ABNORMAL HIGH (ref 150–400)
RBC: 4.61 MIL/uL (ref 3.87–5.11)
RDW: 14.1 % (ref 11.5–15.5)
WBC: 6.7 10*3/uL (ref 4.0–10.5)
nRBC: 0 % (ref 0.0–0.2)

## 2022-05-15 LAB — IRON AND TIBC
Iron: 79 ug/dL (ref 28–170)
Saturation Ratios: 20 % (ref 10.4–31.8)
TIBC: 402 ug/dL (ref 250–450)
UIBC: 323 ug/dL

## 2022-05-15 LAB — LACTATE DEHYDROGENASE: LDH: 146 U/L (ref 98–192)

## 2022-05-15 LAB — FERRITIN: Ferritin: 58 ng/mL (ref 11–307)

## 2022-05-15 LAB — SEDIMENTATION RATE: Sed Rate: 19 mm/hr (ref 0–22)

## 2022-05-15 LAB — C-REACTIVE PROTEIN: CRP: 0.7 mg/dL (ref <1.0)

## 2022-05-16 LAB — ANTINUCLEAR ANTIBODIES, IFA: ANA Ab, IFA: POSITIVE — AB

## 2022-05-16 LAB — RHEUMATOID FACTOR: Rheumatoid fact SerPl-aCnc: 18 IU/mL — ABNORMAL HIGH (ref <14.0)

## 2022-05-16 LAB — FANA STAINING PATTERNS: Speckled Pattern: 24529

## 2022-05-19 ENCOUNTER — Ambulatory Visit (INDEPENDENT_AMBULATORY_CARE_PROVIDER_SITE_OTHER): Payer: No Typology Code available for payment source

## 2022-05-19 ENCOUNTER — Ambulatory Visit (INDEPENDENT_AMBULATORY_CARE_PROVIDER_SITE_OTHER): Payer: No Typology Code available for payment source | Admitting: Podiatry

## 2022-05-19 DIAGNOSIS — M21622 Bunionette of left foot: Secondary | ICD-10-CM

## 2022-05-19 DIAGNOSIS — M79671 Pain in right foot: Secondary | ICD-10-CM

## 2022-05-19 DIAGNOSIS — M7752 Other enthesopathy of left foot: Secondary | ICD-10-CM | POA: Diagnosis not present

## 2022-05-19 DIAGNOSIS — L84 Corns and callosities: Secondary | ICD-10-CM

## 2022-05-19 DIAGNOSIS — M7671 Peroneal tendinitis, right leg: Secondary | ICD-10-CM

## 2022-05-19 DIAGNOSIS — M79672 Pain in left foot: Secondary | ICD-10-CM

## 2022-05-19 DIAGNOSIS — M779 Enthesopathy, unspecified: Secondary | ICD-10-CM

## 2022-05-19 MED ORDER — TRIAMCINOLONE ACETONIDE 10 MG/ML IJ SUSP
20.0000 mg | Freq: Once | INTRAMUSCULAR | Status: AC
  Administered 2022-05-19: 20 mg

## 2022-05-20 NOTE — Progress Notes (Signed)
Subjective:   Patient ID: [name] [name], female   DOB: 61 y.o.   MRN: [id number]   HPI Patient presents stating she has a lot of pain in the outside of her right foot and that she has had problems with the big toe joint left that is been present for a longer period of time.  Patient walks a lot at work smokes quarter pack of cigarettes per day and tries to be active.  Patient does not remember specific injury   Review of Systems  All other systems reviewed and are negative.       Objective:  Physical Exam Vitals and nursing note reviewed.  Constitutional:      Appearance: She is well-developed.  Pulmonary:     Effort: Pulmonary effort is normal.  Musculoskeletal:        General: Normal range of motion.  Skin:    General: Skin is warm.  Neurological:     Mental Status: She is alert.     Neurovascular status found to be intact muscle strength was found to be adequate range of motion within normal limits.  Patient did not have any crepitus or loss of motion of the first MPJ left but the pain is quite intense within the joint surface and also there is a lot of pain in the outside of the fifth metatarsal right at the base around the peroneal insertion.  Patient has moderate flatfoot deformity good digital perfusion well-oriented     Assessment:  Probability for a functional or structural hallux limitus condition left with inflammatory capsulitis along with what appears to be peroneal tendinitis right which may be compensatory     Plan:  Age P reviewed both conditions and get a focus on both of them today due to the acute nature of problem.  I did explain hallux limitus I did review x-rays I did sterile prep injected periarticular around the first MPJ left 3 mg Kenalog 5 mg Xylocaine and for the right I went ahead did sterile prep and injected the peroneal tendon near its insertion 3 mg dexamethasone Kenalog 5 mg Xylocaine and I advised on supportive therapy discussed orthotics  reappoint 2 weeks  X-rays indicate that there is some elongation first metatarsal segment some squaring off of the bone no bone spur formation and the right shows some irritation but no other pathology

## 2022-05-21 NOTE — Progress Notes (Unsigned)
[place], [place] [address]   CLINIC:  Medical Oncology/Hematology  PCP:  Fayrene Helper, MD [address]  [place] [address] [phone]   REASON FOR VISIT:  Follow-up for thrombocytosis  CURRENT THERAPY: Observation  INTERVAL HISTORY:  [name] 61 y.o. female returns for routine follow-up of thrombocytosis.  She was last seen by Tarri Abernethy PA-C on 11/21/2021.  At today's visit, she reports feeling fairly well.   No recent hospitalizations, surgeries, or changes in baseline health status.  Regarding her thrombocytosis, she denies any symptoms of erythromelalgia, aquagenic pruritus, or vasomotor symptoms.  No B symptoms such as fever, chills, night sweats, unintentional weight loss.  She does admit to tobacco use (smokes 3 cigarettes/day). She denies any autoimmune or rheumatoid diseases.  No frequent joint pains or rashes.  Regarding her history of left-sided breast cancer, she denies any symptoms of recurrence such as new lumps, bone pain, chest pain, dyspnea, or abdominal pain.  She has no new headaches, seizures, or focal neurologic deficits. No B symptoms such as fever, chills, night sweats, unintentional weight loss. She receives annual mammograms and breast exams via her PCP.  She has 100% energy and 100% appetite. She endorses that she is maintaining a stable weight.   REVIEW OF SYSTEMS:    Review of Systems  Constitutional:  Negative for appetite change, chills, diaphoresis, fatigue, fever and unexpected weight change.  HENT:   Negative for lump/mass and nosebleeds.   Eyes:  Negative for eye problems.  Respiratory:  Negative for cough, hemoptysis and shortness of breath.   Cardiovascular:  Negative for chest pain, leg swelling and palpitations.  Gastrointestinal:  Negative for abdominal pain, blood in stool, constipation, diarrhea, nausea and vomiting.  Genitourinary:  Negative for hematuria.   Skin:  Negative.   Neurological:  Negative for dizziness, headaches and light-headedness.  Hematological:  Does not bruise/bleed easily.      PAST MEDICAL/SURGICAL HISTORY:  Past Medical History:  Diagnosis Date   Adenocarcinoma of breast ([place])    left    Chronic neck pain 02/10/2016   Heel spur    Hyperlipidemia    Hypertension    Migraines    Obesity    Past Surgical History:  Procedure Laterality Date   BREAST SURGERY     btl  1992   CHOLECYSTECTOMY     COLONOSCOPY  09/15/2011   Procedure: COLONOSCOPY;  Surgeon: Dorothyann Peng, MD;  [facility];  Service: Endoscopy;  Laterality: N/A;  9:15 AM   COLONOSCOPY N/A 08/16/2018   Procedure: COLONOSCOPY;  Surgeon: Danie Binder, MD;  Location: [facility];  Service: Endoscopy;  Laterality: N/A;  3:00   left lumpectomy and lymph node disection  2000   POLYPECTOMY  08/16/2018   Procedure: POLYPECTOMY;  Surgeon: Danie Binder, MD;  Location: [facility];  Service: Endoscopy;;  ascending colon(CSx3), descending colon(CSx1)   r heel surgery for spur and chipped bone  11/09/20099   TUBAL LIGATION       SOCIAL HISTORY:  Social History   Socioeconomic History   Marital status: Married    Spouse name: Not on file   Number of children: Not on file   Years of education: Not on file   Highest education level: Not on file  Occupational History   Not on file  Tobacco Use   Smoking status: Every Day    Packs/day: 0.25    Years: 15.00  Total pack years: 3.75    Types: Cigarettes   Smokeless tobacco: Never   Tobacco comments:    smoking 3 per day  Vaping Use   Vaping Use: Never used  Substance and Sexual Activity   Alcohol use: No    Alcohol/week: 0.0 standard drinks of alcohol   Drug use: No    Comment: pt states "3 cigarrettes per day"   Sexual activity: Yes    Birth control/protection: Post-menopausal  Other Topics Concern   Not on file  Social History Narrative   Not on file   Social Determinants of  Health   Financial Resource Strain: Not on file  Food Insecurity: Not on file  Transportation Needs: Not on file  Physical Activity: Not on file  Stress: Not on file  Social Connections: Not on file  Intimate Partner Violence: Not on file    FAMILY HISTORY:  Family History  Problem Relation Age of Onset   Heart attack Mother    Heart attack Father    Glaucoma Father    Hypertension Father    Diabetes Sister    Anesthesia problems Neg Hx    Hypotension Neg Hx    Malignant hyperthermia Neg Hx    Pseudochol deficiency Neg Hx    Migraines Neg Hx     CURRENT MEDICATIONS:  Outpatient Encounter Medications as of 05/22/2022  Medication Sig   amLODipine (NORVASC) 2.5 MG tablet Take 1 tablet (2.5 mg total) by mouth daily.   Biotin 1000 MCG tablet Take 1,000 mcg by mouth daily at 6 (six) AM.    fluticasone (FLONASE) 50 MCG/ACT nasal spray USE 2 SPRAYS IN EACH NOSTRIL ONCE DAILY   gabapentin (NEURONTIN) 100 MG capsule Take one to two capsules by mouth at bedtime for pain   hydrochlorothiazide (HYDRODIURIL) 25 MG tablet Take 1 tablet (25 mg total) by mouth daily.   ibuprofen (ADVIL) 800 MG tablet Take 1 tablet (800 mg total) by mouth 3 (three) times daily.   potassium chloride SA (KLOR-CON M) 20 MEQ tablet Take 1 tablet (20 mEq total) by mouth daily.   Vitamin D, Ergocalciferol, (DRISDOL) 1.25 MG (50000 UNIT) CAPS capsule Take 1 capsule (50,000 Units total) by mouth once a week.   No facility-administered encounter medications on file as of 05/22/2022.    ALLERGIES:  Allergies  Allergen Reactions   Ace Inhibitors Cough     PHYSICAL EXAM:    ECOG PERFORMANCE STATUS: 0 - Asymptomatic  There were no vitals filed for this visit. There were no vitals filed for this visit. Physical Exam Constitutional:      Appearance: Normal appearance. She is obese.  HENT:     Head: Normocephalic and atraumatic.     Mouth/Throat:     Mouth: Mucous membranes are moist.  Eyes:     Extraocular  Movements: Extraocular movements intact.     Pupils: Pupils are equal, round, and reactive to light.  Cardiovascular:     Rate and Rhythm: Normal rate and regular rhythm.     Pulses: Normal pulses.     Heart sounds: Normal heart sounds.  Pulmonary:     Effort: Pulmonary effort is normal.     Breath sounds: Normal breath sounds.  Chest:     Comments: Breast exam deferred - patient receives annual breast exams via PCP Abdominal:     General: Bowel sounds are normal.     Palpations: Abdomen is soft.     Tenderness: There is no abdominal tenderness.  Musculoskeletal:  General: No swelling.     Right lower leg: No edema.     Left lower leg: No edema.  Lymphadenopathy:     Cervical: No cervical adenopathy.  Skin:    General: Skin is warm and dry.  Neurological:     General: No focal deficit present.     Mental Status: She is alert and oriented to person, place, and time.  Psychiatric:        Mood and Affect: Mood normal.        Behavior: Behavior normal.      LABORATORY DATA:  I have reviewed the labs as listed.  CBC    Component Value Date/Time   WBC 6.7 05/15/2022 0921   RBC 4.61 05/15/2022 0921   HGB 14.1 05/15/2022 0921   HGB 13.7 04/09/2017 1445   HCT 42.9 05/15/2022 0921   HCT 39.9 04/09/2017 1445   PLT 438 (H) 05/15/2022 0921   PLT 478 (H) 04/09/2017 1445   MCV 93.1 05/15/2022 0921   MCV 88 04/09/2017 1445   MCH 30.6 05/15/2022 0921   MCHC 32.9 05/15/2022 0921   RDW 14.1 05/15/2022 0921   RDW 14.9 04/09/2017 1445   LYMPHSABS 2.7 05/15/2022 0921   LYMPHSABS 4.5 (H) 04/09/2017 1445   MONOABS 0.5 05/15/2022 0921   EOSABS 0.2 05/15/2022 0921   EOSABS 0.2 04/09/2017 1445   BASOSABS 0.1 05/15/2022 0921   BASOSABS 0.1 04/09/2017 1445      Latest Ref Rng & Units 05/15/2022    9:21 AM 02/13/2022   11:01 AM 01/31/2022   12:38 PM  CMP  Glucose 70 - 99 mg/dL 91  87  99   BUN 6 - 20 mg/dL _0 Creatinine 0.44 - 1.00 mg/dL 0.39  0.59  0.58   Sodium  135 - 145 mmol/L 137  142  141   Potassium 3.5 - 5.1 mmol/L 3.6  4.2  3.3   Chloride 98 - 111 mmol/L 104  103  107   CO2 22 - 32 mmol/L _1 Calcium 8.9 - 10.3 mg/dL 9.2  10.0  9.6   Total Protein 6.5 - 8.1 g/dL 7.6  7.0  7.4   Total Bilirubin 0.3 - 1.2 mg/dL 0.3  0.3  0.5   Alkaline Phos 38 - 126 U/L 66  74  67   AST 15 - 41 U/L _2 ALT 0 - 44 U/L _3 DIAGNOSTIC IMAGING:  I have independently reviewed the relevant imaging and discussed with the patient.  ASSESSMENT & PLAN: 1.  Thrombocytosis: - Mild thrombocytosis (platelets 400-500) since at least 2008 - Myeloproliferative work-up negative for JAK2/exon 12/CALR/MPL. - She smokes 3 cigarettes/day   - No connective tissue, autoimmune, or rheumatoid disease.  No recurrent joint pains, rashes, or B symptoms. - She is obese with BMI 33 - Denies any vasomotor symptoms like erythromelalgia or aquagenic pruritus.   - Labs (05/15/2022): Platelets 438, otherwise normal CBC. Normal CMP, LDH, ESR, CRP. Borderline iron deficiency with ferritin 58, iron saturation 20% Rheumatoid factor weakly positive (18.0), ANA positive and speckled pattern - Her platelet count over the last several years has been stable within baseline range. - Suspect reactive thrombocytosis in the setting of tobacco use and obesity.  She may have some other source of occult inflammation, especially in the setting of RF/ANA +, but in the absence of any rheumatoid  type symptoms, no indication for rheumatology work-up at this time.     - PLAN: Recommend iron tablet (ferrous sulfate 325 mg) every other day. - Recommend to continued observation without intervention.  If any significant change in the future, we will consider bone marrow aspiration and biopsy.   - Repeat labs and RTC in 1 year   2.  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  She did not receive antiestrogen therapy (ER/PR negative). - Most recent mammogram (06/17/2021): No mammographic evidence of malignancy in either breast, BI-RADS Category 1, negative   - No symptoms concerning for recurrent breast cancer at this time - Breast exam deferred, as patient receives annual breast exams via PCP   - Labs (05/15/2022) show unremarkable CBC and CMP, nonconcerning for recurrent malignancy.  Normal LDH and vitamin D. - PLAN: Continue annual mammograms as ordered by PCP (due September 2023).  Continue annual breast exams with PCP.   PLAN SUMMARY & DISPOSITION: Labs and RTC in 1 year  All questions were answered. The patient knows to call the clinic with any problems, questions or concerns.  Medical decision making: Moderate    Time spent on visit: I spent 20 minutes counseling the patient face to face. The total time spent in the appointment was 30 minutes and more than 50% was on counseling.   Harriett Rush, PA-C  05/22/2022 9:05 AM

## 2022-05-22 ENCOUNTER — Inpatient Hospital Stay (HOSPITAL_BASED_OUTPATIENT_CLINIC_OR_DEPARTMENT_OTHER): Payer: No Typology Code available for payment source | Admitting: Physician Assistant

## 2022-05-22 VITALS — BP 134/79 | HR 78 | Temp 98.0°F | Resp 16 | Ht 62.0 in | Wt 192.5 lb

## 2022-05-22 DIAGNOSIS — D75839 Thrombocytosis, unspecified: Secondary | ICD-10-CM

## 2022-05-22 NOTE — Patient Instructions (Signed)
[name] [name] at [place] **   You were seen today by Tarri Abernethy PA-C for your follow-up visit.    HIGH PLATELETS Your high platelets are most likely from underlying inflammation. This inflammation may be coming from smoking, obesity, and slightly low iron levels. You also had positive lab results for possible autoimmune disease, but since you are not having any symptoms of autoimmune disease, there is nothing that we need to do about this right now. I would like you to START taking over-the-counter iron supplement (ferrous sulfate 325 mg) every other day.  HISTORY OF BREAST CANCER Continue annual breast exams and mammograms with Dr. Moshe Cipro. Do not hesitate to reach out to our office if you have any other questions or concerns prior to your next visit.  LABS: Return in 1 year for repeat labs  FOLLOW-UP APPOINTMENT: Office visit in 1 year, after labs  ** Thank you for trusting me with your healthcare!  I strive to provide all of my patients with quality care at each visit.  If you receive a survey for this visit, I would be so grateful to you for taking the time to provide feedback.  Thank you in advance!  ~ Jaila Schellhorn                   Dr. Derek Jack   &   Tarri Abernethy, PA-C   - - - - - - - - - - - - - - - - - -    Thank you for choosing [facility] at [facility] to provide your oncology and hematology care.  To afford each patient quality time with our provider, please arrive at least 15 minutes before your scheduled appointment time.   If you have a lab appointment with the [facility] please come in thru the Main Entrance and check in at the main information desk.  You need to re-schedule your appointment should you arrive 10 or more minutes late.  We strive to give you quality time with our providers, and arriving late affects you and other patients whose appointments are  after yours.  Also, if you no show three or more times for appointments you may be dismissed from the clinic at the providers discretion.     Again, thank you for choosing [facility].  Our hope is that these requests will decrease the amount of time that you wait before being seen by our physicians.       _____________________________________________________________  Should you have questions after your visit to [facility], please contact our office at [phone] and follow the prompts.  Our office hours are 8:00 a.m. and 4:30 p.m. Monday - Friday.  Please note that voicemails left after 4:00 p.m. may not be returned until the following business day.  We are closed weekends and major holidays.  You do have access to a nurse 24-7, just call the main number to the clinic [phone] and do not press any options, hold on the line and a nurse will answer the phone.    For prescription refill requests, have your pharmacy contact our office and allow 72 hours.

## 2022-05-24 ENCOUNTER — Other Ambulatory Visit: Payer: Self-pay | Admitting: Podiatry

## 2022-05-24 DIAGNOSIS — M779 Enthesopathy, unspecified: Secondary | ICD-10-CM

## 2022-06-05 ENCOUNTER — Other Ambulatory Visit (HOSPITAL_COMMUNITY): Payer: Self-pay | Admitting: Family Medicine

## 2022-06-05 DIAGNOSIS — Z1231 Encounter for screening mammogram for malignant neoplasm of breast: Secondary | ICD-10-CM

## 2022-06-19 ENCOUNTER — Encounter: Payer: Self-pay | Admitting: Podiatry

## 2022-06-19 ENCOUNTER — Ambulatory Visit: Payer: No Typology Code available for payment source | Admitting: Podiatry

## 2022-06-19 DIAGNOSIS — M7671 Peroneal tendinitis, right leg: Secondary | ICD-10-CM

## 2022-06-19 DIAGNOSIS — M7752 Other enthesopathy of left foot: Secondary | ICD-10-CM

## 2022-06-20 NOTE — Progress Notes (Signed)
Subjective:   Patient ID: [name] [name], female   DOB: 61 y.o.   MRN: [id number]   HPI Patient states she has quite a bit of improvement with mild discomfort only if she is on her feet for too long of the time eft hip neurovascular status intact significant improvement to the peroneal insertion right first MPJ left with mild discomfort no swelling noted currently   ROS      Objective:  Physical Exam  Overall doing well with diminishment of discomfort in the peroneal tendon right and the first MPJ left     Assessment:  Inflammatory condition which appears to be improving      Plan:  Reviewed the condition and at this point organ to defer future treatment unless symptoms get worse again and I am hopeful that this will be the end of this episode she is experiencing.  Patient will be seen back to recheck

## 2022-06-21 ENCOUNTER — Ambulatory Visit (HOSPITAL_COMMUNITY)
Admission: RE | Admit: 2022-06-21 | Discharge: 2022-06-21 | Disposition: A | Discharge status: Home / Self Care | Payer: No Typology Code available for payment source | Source: Ambulatory Visit | Attending: Family Medicine | Admitting: Family Medicine

## 2022-06-21 DIAGNOSIS — Z1231 Encounter for screening mammogram for malignant neoplasm of breast: Secondary | ICD-10-CM | POA: Diagnosis present

## 2022-06-26 ENCOUNTER — Other Ambulatory Visit: Payer: Self-pay | Admitting: Internal Medicine

## 2022-06-26 DIAGNOSIS — M79671 Pain in right foot: Secondary | ICD-10-CM

## 2022-06-26 DIAGNOSIS — M79675 Pain in left toe(s): Secondary | ICD-10-CM

## 2022-06-27 ENCOUNTER — Other Ambulatory Visit (HOSPITAL_COMMUNITY): Payer: Self-pay

## 2022-06-27 MED ORDER — IBUPROFEN 800 MG PO TABS
800.0000 mg | ORAL_TABLET | Freq: Three times a day (TID) | ORAL | 0 refills | Status: DC
  Filled 2022-06-27: qty 30, 10d supply, fill #0

## 2022-06-29 ENCOUNTER — Other Ambulatory Visit (HOSPITAL_COMMUNITY): Payer: Self-pay

## 2022-07-26 ENCOUNTER — Other Ambulatory Visit (HOSPITAL_COMMUNITY): Payer: Self-pay

## 2022-07-26 ENCOUNTER — Other Ambulatory Visit: Payer: Self-pay | Admitting: Family Medicine

## 2022-07-26 DIAGNOSIS — I10 Essential (primary) hypertension: Secondary | ICD-10-CM

## 2022-07-26 MED ORDER — VITAMIN D (ERGOCALCIFEROL) 1.25 MG (50000 UNIT) PO CAPS
50000.0000 [IU] | ORAL_CAPSULE | ORAL | 3 refills | Status: DC
  Filled 2022-07-26: qty 12, 84d supply, fill #0
  Filled 2022-10-29 – 2022-10-30 (×2): qty 12, 84d supply, fill #1

## 2022-07-26 MED ORDER — AMLODIPINE BESYLATE 2.5 MG PO TABS
2.5000 mg | ORAL_TABLET | Freq: Every day | ORAL | 1 refills | Status: DC
  Filled 2022-07-26: qty 90, 90d supply, fill #0
  Filled 2022-10-29 – 2022-10-30 (×2): qty 90, 90d supply, fill #1

## 2022-07-27 ENCOUNTER — Other Ambulatory Visit (HOSPITAL_COMMUNITY): Payer: Self-pay

## 2022-07-29 ENCOUNTER — Other Ambulatory Visit: Payer: Self-pay | Admitting: Family Medicine

## 2022-07-31 ENCOUNTER — Other Ambulatory Visit (HOSPITAL_COMMUNITY): Payer: Self-pay

## 2022-07-31 MED ORDER — POTASSIUM CHLORIDE CRYS ER 20 MEQ PO TBCR
20.0000 meq | EXTENDED_RELEASE_TABLET | Freq: Every day | ORAL | 1 refills | Status: DC
  Filled 2022-07-31: qty 90, 90d supply, fill #0
  Filled 2022-10-29 – 2022-10-30 (×2): qty 90, 90d supply, fill #1

## 2022-08-24 ENCOUNTER — Other Ambulatory Visit (HOSPITAL_COMMUNITY): Payer: Self-pay

## 2022-08-24 ENCOUNTER — Other Ambulatory Visit: Payer: Self-pay | Admitting: Family Medicine

## 2022-08-24 DIAGNOSIS — M79675 Pain in left toe(s): Secondary | ICD-10-CM

## 2022-08-24 DIAGNOSIS — M79671 Pain in right foot: Secondary | ICD-10-CM

## 2022-08-24 MED ORDER — IBUPROFEN 800 MG PO TABS
800.0000 mg | ORAL_TABLET | Freq: Three times a day (TID) | ORAL | 0 refills | Status: DC
  Filled 2022-08-24: qty 30, 10d supply, fill #0

## 2022-08-25 ENCOUNTER — Ambulatory Visit (INDEPENDENT_AMBULATORY_CARE_PROVIDER_SITE_OTHER): Payer: No Typology Code available for payment source | Admitting: Family Medicine

## 2022-08-25 ENCOUNTER — Other Ambulatory Visit (HOSPITAL_COMMUNITY)
Admission: RE | Admit: 2022-08-25 | Discharge: 2022-08-25 | Disposition: A | Discharge status: Home / Self Care | Payer: No Typology Code available for payment source | Source: Ambulatory Visit | Attending: Family Medicine | Admitting: Family Medicine

## 2022-08-25 ENCOUNTER — Encounter: Payer: Self-pay | Admitting: Family Medicine

## 2022-08-25 ENCOUNTER — Other Ambulatory Visit: Payer: Self-pay

## 2022-08-25 ENCOUNTER — Other Ambulatory Visit (HOSPITAL_COMMUNITY): Payer: Self-pay

## 2022-08-25 VITALS — BP 105/75 | HR 87 | Ht 62.0 in | Wt 194.0 lb

## 2022-08-25 DIAGNOSIS — Z0001 Encounter for general adult medical examination with abnormal findings: Secondary | ICD-10-CM

## 2022-08-25 DIAGNOSIS — R221 Localized swelling, mass and lump, neck: Secondary | ICD-10-CM

## 2022-08-25 DIAGNOSIS — Z124 Encounter for screening for malignant neoplasm of cervix: Secondary | ICD-10-CM

## 2022-08-25 DIAGNOSIS — F17218 Nicotine dependence, cigarettes, with other nicotine-induced disorders: Secondary | ICD-10-CM | POA: Diagnosis not present

## 2022-08-25 DIAGNOSIS — R7303 Prediabetes: Secondary | ICD-10-CM

## 2022-08-25 DIAGNOSIS — M542 Cervicalgia: Secondary | ICD-10-CM

## 2022-08-25 DIAGNOSIS — M62838 Other muscle spasm: Secondary | ICD-10-CM | POA: Diagnosis not present

## 2022-08-25 DIAGNOSIS — I10 Essential (primary) hypertension: Secondary | ICD-10-CM

## 2022-08-25 DIAGNOSIS — W19XXXA Unspecified fall, initial encounter: Secondary | ICD-10-CM

## 2022-08-25 DIAGNOSIS — M79631 Pain in right forearm: Secondary | ICD-10-CM | POA: Diagnosis not present

## 2022-08-25 DIAGNOSIS — E7849 Other hyperlipidemia: Secondary | ICD-10-CM

## 2022-08-25 DIAGNOSIS — M79641 Pain in right hand: Secondary | ICD-10-CM

## 2022-08-25 DIAGNOSIS — E559 Vitamin D deficiency, unspecified: Secondary | ICD-10-CM

## 2022-08-25 MED ORDER — CYCLOBENZAPRINE HCL 10 MG PO TABS
10.0000 mg | ORAL_TABLET | Freq: Three times a day (TID) | ORAL | 0 refills | Status: DC | PRN
  Filled 2022-08-25: qty 30, 10d supply, fill #0

## 2022-08-25 NOTE — Patient Instructions (Signed)
Follow-up in 5 months, call if you need me sooner.  Pap sent today.  Please get fasting labs next week which are ordered.  You are referred to Dr. Aline Brochure regarding right neck pain and right hand and forearm pain following the fall.  Prescription sent in for muscle relaxant for bedtime use primarily to help with the neck pain and spasm.  You are referred for an ultrasound of your neck because of swelling of the right neck  Thanks for choosing  [facility], we consider it a privelige to serve you.

## 2022-08-27 ENCOUNTER — Encounter: Payer: Self-pay | Admitting: Family Medicine

## 2022-08-27 DIAGNOSIS — W19XXXA Unspecified fall, initial encounter: Secondary | ICD-10-CM | POA: Insufficient documentation

## 2022-08-27 DIAGNOSIS — M62838 Other muscle spasm: Secondary | ICD-10-CM | POA: Insufficient documentation

## 2022-08-27 DIAGNOSIS — Z0001 Encounter for general adult medical examination with abnormal findings: Secondary | ICD-10-CM | POA: Insufficient documentation

## 2022-08-27 DIAGNOSIS — R221 Localized swelling, mass and lump, neck: Secondary | ICD-10-CM | POA: Insufficient documentation

## 2022-08-27 NOTE — Assessment & Plan Note (Signed)
Bedtime muscle relaxant

## 2022-08-27 NOTE — Assessment & Plan Note (Signed)
Swelling and pain of right hand, right forearm and right elbow pain, refer ortho, continue ice and  anti inflammatory

## 2022-08-27 NOTE — Progress Notes (Signed)
    [name] [name]     MRN: [id number]      DOB: 16-Jun-1961  HPI: Patient is in for annual physical exam. .Right hand, forearm and neck pain following fall on outstretched hand on 11/30, c/o increased right neck pain and spasm with decreased mobility, also pain and swelling of right hand C/o  swelling of right neck painless x wweks Recent labs,  are reviewed. Immunization is reviewed , and  updated if needed.   PE: BP 105/75 (BP Location: Right Arm, Patient Position: Sitting, Cuff Size: Large)   Pulse 87   Ht '5\' 2"'$  (1.575 m)   Wt 194 lb (88 kg)   SpO2 91%   BMI 35.48 kg/m   Pleasant  female, alert and oriented x 3, in no cardio-pulmonary distress. Afebrile. HEENT No facial trauma or asymetry. Sinuses non tender.  Extra occullar muscles intact.. External ears normal, . Neck: decreased ROM questionable right neck mass,no adenopathy,JVD or thyromegaly.No bruits.  Chest: Clear to ascultation bilaterally.No crackles or wheezes. Non tender to palpation  Breast: Assymetry left smaller and hyperpigmented following surgery and radiation,no masses or lumps. No tenderness. No nipple discharge or inversion. No axillary or supraclavicular adenopathy  Cardiovascular system; Heart sounds normal,  S1 and  S2 ,no S3.  No murmur, or thrill. Apical beat not displaced Peripheral pulses normal.  Abdomen: Soft, non tender, no organomegaly or masses. No bruits. Bowel sounds normal. No guarding, tenderness or rebound.   GU: External genitalia normal female genitalia , normal female distribution of hair. No lesions. Urethral meatus normal in size, no  Prolapse, no lesions visibly  Present. Bladder non tender. Vagina pink and moist , with no visible lesions , discharge present . Adequate pelvic support no  cystocele or rectocele noted Cervix pink and appears healthy, no lesions or ulcerations noted, no discharge noted from os Uterus normal size, no adnexal masses, no cervical motion or  adnexal tenderness.   Musculoskeletal exam: Full ROM of spine, hips , shoulders and knees.swelling and tenderness of right hand wih decreased mobility, tender over right elbow, lateral aspect . No muscle wasting or atrophy.   Neurologic: Cranial nerves 2 to 12 intact. Power, tone ,sensation and reflexes normal throughout. No disturbance in gait. No tremor.  Skin: Intact, no ulceration, erythema , scaling or rash noted. Pigmentation normal throughout  Psych; Normal mood and affect. Judgement and concentration normal   Assessment & Plan:  Annual visit for general adult medical examination with abnormal findings Annual exam as documented. Counseling done  re healthy lifestyle involving commitment to 150 minutes exercise per week, heart healthy diet, and attaining healthy weight.The importance of adequate sleep also discussed. Regular seat belt use and home safety, is also discussed. Changes in health habits are decided on by the patient with goals and time frames  set for achieving them. Immunization and cancer screening needs are specifically addressed at this visit.   Fall Swelling and pain of right hand, right forearm and right elbow pain, refer ortho, continue ice and  anti inflammatory  Nicotine dependence Asked:confirms currently smokes cigarettes Assess: Unwilling to set a quit date, but is cutting back Advise: needs to QUIT to reduce risk of cancer, cardio and cerebrovascular disease Assist: counseled for 5 minutes and literature provided Arrange: follow up in 2 to 4 months   Mass of right side of neck US to evaluate  Neck muscle spasm Bedtime muscle relaxant

## 2022-08-27 NOTE — Assessment & Plan Note (Signed)

## 2022-08-27 NOTE — Assessment & Plan Note (Signed)
[place] to evaluate

## 2022-08-27 NOTE — Assessment & Plan Note (Signed)
Asked:confirms currently smokes cigarettes °Assess: Unwilling to set a quit date, but is cutting back °Advise: needs to QUIT to reduce risk of cancer, cardio and cerebrovascular disease °Assist: counseled for 5 minutes and literature provided °Arrange: follow up in 2 to 4 months ° °

## 2022-08-28 ENCOUNTER — Encounter: Payer: Self-pay | Admitting: Orthopedic Surgery

## 2022-08-28 ENCOUNTER — Other Ambulatory Visit (HOSPITAL_COMMUNITY): Payer: Self-pay

## 2022-08-29 LAB — CYTOLOGY - PAP
Comment: NEGATIVE
Diagnosis: NEGATIVE
High risk HPV: NEGATIVE

## 2022-08-30 LAB — CMP14+EGFR
ALT: 9 IU/L (ref 0–32)
AST: 13 IU/L (ref 0–40)
Albumin/Globulin Ratio: 1.5 (ref 1.2–2.2)
Albumin: 4.1 g/dL (ref 3.8–4.9)
Alkaline Phosphatase: 75 IU/L (ref 44–121)
BUN/Creatinine Ratio: 22 (ref 12–28)
BUN: 12 mg/dL (ref 8–27)
Bilirubin Total: 0.3 mg/dL (ref 0.0–1.2)
CO2: 24 mmol/L (ref 20–29)
Calcium: 9.9 mg/dL (ref 8.7–10.3)
Chloride: 103 mmol/L (ref 96–106)
Creatinine, Ser: 0.54 mg/dL — ABNORMAL LOW (ref 0.57–1.00)
Globulin, Total: 2.8 g/dL (ref 1.5–4.5)
Glucose: 85 mg/dL (ref 70–99)
Potassium: 3.9 mmol/L (ref 3.5–5.2)
Sodium: 141 mmol/L (ref 134–144)
Total Protein: 6.9 g/dL (ref 6.0–8.5)
eGFR: 105 mL/min/{1.73_m2} (ref >59)

## 2022-08-30 LAB — HEMOGLOBIN A1C
Est. average glucose Bld gHb Est-mCnc: 128 mg/dL
Hgb A1c MFr Bld: 6.1 % — ABNORMAL HIGH (ref 4.8–5.6)

## 2022-08-30 LAB — CBC WITH DIFFERENTIAL/PLATELET
Basophils Absolute: 0.1 10*3/uL (ref 0.0–0.2)
Basos: 1 %
EOS (ABSOLUTE): 0.3 10*3/uL (ref 0.0–0.4)
Eos: 4 %
Hematocrit: 41.8 % (ref 34.0–46.6)
Hemoglobin: 13.9 g/dL (ref 11.1–15.9)
Immature Grans (Abs): 0 10*3/uL (ref 0.0–0.1)
Immature Granulocytes: 0 %
Lymphocytes Absolute: 2.9 10*3/uL (ref 0.7–3.1)
Lymphs: 39 %
MCH: 30.5 pg (ref 26.6–33.0)
MCHC: 33.3 g/dL (ref 31.5–35.7)
MCV: 92 fL (ref 79–97)
Monocytes Absolute: 0.6 10*3/uL (ref 0.1–0.9)
Monocytes: 8 %
Neutrophils Absolute: 3.7 10*3/uL (ref 1.4–7.0)
Neutrophils: 48 %
Platelets: 491 10*3/uL — ABNORMAL HIGH (ref 150–450)
RBC: 4.56 x10E6/uL (ref 3.77–5.28)
RDW: 13.8 % (ref 11.7–15.4)
WBC: 7.5 10*3/uL (ref 3.4–10.8)

## 2022-08-30 LAB — LIPID PANEL
Chol/HDL Ratio: 3.4 ratio (ref 0.0–4.4)
Cholesterol, Total: 205 mg/dL — ABNORMAL HIGH (ref 100–199)
HDL: 60 mg/dL (ref >39)
LDL Chol Calc (NIH): 129 mg/dL — ABNORMAL HIGH (ref 0–99)
Triglycerides: 90 mg/dL (ref 0–149)
VLDL Cholesterol Cal: 16 mg/dL (ref 5–40)

## 2022-08-30 LAB — VITAMIN D 25 HYDROXY (VIT D DEFICIENCY, FRACTURES): Vit D, 25-Hydroxy: 39.2 ng/mL (ref 30.0–100.0)

## 2022-08-30 LAB — TSH: TSH: 1.04 u[IU]/mL (ref 0.450–4.500)

## 2022-09-01 ENCOUNTER — Emergency Department (HOSPITAL_COMMUNITY): Payer: No Typology Code available for payment source

## 2022-09-01 ENCOUNTER — Encounter: Payer: Self-pay | Admitting: Family Medicine

## 2022-09-01 ENCOUNTER — Encounter (HOSPITAL_COMMUNITY): Payer: Self-pay

## 2022-09-01 ENCOUNTER — Emergency Department (HOSPITAL_COMMUNITY)
Admission: EM | Admit: 2022-09-01 | Discharge: 2022-09-01 | Disposition: A | Discharge status: Home / Self Care | Payer: PRIVATE HEALTH INSURANCE | Attending: Emergency Medicine | Admitting: Emergency Medicine

## 2022-09-01 ENCOUNTER — Other Ambulatory Visit: Payer: Self-pay

## 2022-09-01 DIAGNOSIS — W182XXA Fall in (into) shower or empty bathtub, initial encounter: Secondary | ICD-10-CM | POA: Insufficient documentation

## 2022-09-01 DIAGNOSIS — S4992XA Unspecified injury of left shoulder and upper arm, initial encounter: Secondary | ICD-10-CM | POA: Insufficient documentation

## 2022-09-01 DIAGNOSIS — I10 Essential (primary) hypertension: Secondary | ICD-10-CM | POA: Insufficient documentation

## 2022-09-01 DIAGNOSIS — Z79899 Other long term (current) drug therapy: Secondary | ICD-10-CM | POA: Diagnosis not present

## 2022-09-01 MED ORDER — OXYCODONE-ACETAMINOPHEN 5-325 MG PO TABS
1.0000 | ORAL_TABLET | Freq: Three times a day (TID) | ORAL | 0 refills | Status: DC | PRN
  Filled 2022-09-01 – 2022-09-04 (×2): qty 9, 3d supply, fill #0

## 2022-09-01 MED ORDER — KETOROLAC TROMETHAMINE 60 MG/2ML IM SOLN
60.0000 mg | Freq: Once | INTRAMUSCULAR | Status: AC
  Administered 2022-09-01: 60 mg via INTRAMUSCULAR
  Filled 2022-09-01: qty 2

## 2022-09-01 NOTE — ED Triage Notes (Signed)
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 Pt denies head injury or LOC. Pt has pain with shoulder flexion and abduction. Distal CMS intact.

## 2022-09-01 NOTE — Discharge Instructions (Addendum)
Your imaging showed no fractures or dislocation.  You could have a rotator cuff injury.  Take Tylenol and ibuprofen as needed for pain.  If pain is still severe you can take a Percocet but do not take while drinking alcohol or driving a vehicle.  Make an appointment with the orthopedist listed in your discharge paperwork for further workup and management of your left shoulder injury.

## 2022-09-01 NOTE — ED Provider Notes (Signed)
[facility] EMERGENCY DEPARTMENT Provider Note   CSN: [id number] Arrival date & time: 09/01/22  1626     History  Chief Complaint  Patient presents with   Shoulder Injury    [name] [name] is a 61 y.o. female.  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  While trying to help the client shower she fell to the left side hitting her left shoulder.  She developed pain in her left shoulder that shoots down to her left elbow.  She denies any head injury or loss of consciousness.  She is not on any anticoagulation.  She has no loss of sensation numbness or tingling.  Has full range of motion of hand and elbow intact.  Has pain with abduction of her left shoulder and difficulty moving it.   Shoulder Injury       Home Medications Prior to Admission medications   Medication Sig Start Date End Date Taking? Authorizing Provider  oxyCODONE-acetaminophen (PERCOCET/ROXICET) 5-325 MG tablet Take 1 tablet by mouth every 8 (eight) hours as needed for up to 9 doses for severe pain. 09/01/22  Yes Elgie Congo, MD  amLODipine (NORVASC) 2.5 MG tablet Take 1 tablet (2.5 mg total) by mouth daily. 07/26/22   Fayrene Helper, MD  Biotin 1000 MCG tablet Take 1,000 mcg by mouth daily at 6 (six) AM.     [provider]  cyclobenzaprine (FLEXERIL) 10 MG tablet Take 1 tablet (10 mg total) by mouth 3 (three) times daily as needed for muscle spasms. 08/25/22   Fayrene Helper, MD  fluticasone [facility]) 50 MCG/ACT nasal spray USE 2 SPRAYS IN EACH NOSTRIL ONCE DAILY 01/26/20   Fayrene Helper, MD  hydrochlorothiazide (HYDRODIURIL) 25 MG tablet Take 1 tablet (25 mg total) by mouth daily. 03/29/22   Fayrene Helper, MD  ibuprofen (ADVIL) 800 MG tablet Take 1 tablet (800 mg total) by mouth 3 (three) times daily. 08/24/22   Fayrene Helper, MD  potassium chloride SA (KLOR-CON M) 20 MEQ tablet Take 1  tablet (20 mEq total) by mouth daily. 07/31/22   Fayrene Helper, MD  Vitamin D, Ergocalciferol, (DRISDOL) 1.25 MG (50000 UNIT) CAPS capsule Take 1 capsule (50,000 Units total) by mouth once a week. 07/26/22   Fayrene Helper, MD      Allergies    Ace inhibitors    Review of Systems   Review of Systems  Physical Exam Updated Vital Signs BP (!) 133/90 (BP Location: Right Arm)   Pulse 87   Temp 98.5 F (36.9 C) (Oral)   Resp 18   Ht '5\' 2"'$  (1.575 m)   Wt 88.5 kg   SpO2 98%   BMI 35.67 kg/m  Physical Exam Constitutional: Alert and oriented.  Slightly uncomfortable but nontoxic Eyes: Conjunctivae are normal. ENT      Head: Normocephalic and atraumatic.      Nose: No congestion.      Mouth/Throat: Mucous membranes are moist.      Neck: No stridor. No midline ttp, stepoff or deformity Cardiovascular: S1, S2, palpable radial pulses, capillary refill left upper extremity less than 2 seconds.Warm and well perfused. Respiratory: Normal respiratory effort.  Gastrointestinal: Soft and nondistended Musculoskeletal: Tenderness to palpation overlying the left anterior and lateral shoulder and pain with abduction of left shoulder.  Mild ttp of left elbow, no deformity. Sensation grossly intact distally with full range  of motion of left elbow and left hand and grip strength intact.  Palpable radial pulses and warm well-perfused with no sensation deficits.      Right lower leg: No tenderness or edema.      Left lower leg: No tenderness or edema. Neurologic: Normal speech and language. No gross focal neurologic deficits are appreciated. Skin: Skin is warm, dry and intact. No rash noted. Psychiatric: Mood and affect are normal. Speech and behavior are normal.  ED Results / Procedures / Treatments   Labs (all labs ordered are listed, but only abnormal results are displayed) Labs Reviewed - No data to display  EKG None  Radiology DG Elbow Complete Left  Result Date:  09/01/2022 CLINICAL DATA:  Pain EXAM: LEFT ELBOW - COMPLETE 3+ VIEW COMPARISON:  None Available. FINDINGS: No recent fracture or dislocation is seen. There is small linear smooth marginated calcification in the lateral margin of distal humerus, possibly old avulsion or ligament calcification from previous injury. Small bony spurs are noted in proximal ulna. There is no significant effusion. IMPRESSION: No recent fracture or dislocation is seen. Linear smooth marginated calcification adjacent to the lateral condyle of the distal femur suggests ligament calcification or old ununited avulsion. Electronically Signed   By: Elmer Picker M.D.   On: 09/01/2022 19:56   DG Shoulder Left  Result Date: 09/01/2022 CLINICAL DATA:  Trauma, fall EXAM: LEFT SHOULDER - 2+ VIEW COMPARISON:  None Available. FINDINGS: No fracture or dislocation is seen. Surgical clips are seen in left chest wall. IMPRESSION: No fracture or dislocation is seen in left shoulder. Electronically Signed   By: Elmer Picker M.D.   On: 09/01/2022 17:56    Procedures Procedures    Medications Ordered in ED Medications  ketorolac (TORADOL) injection 60 mg (60 mg Intramuscular Given 09/01/22 1933)    ED Course/ Medical Decision Making/ A&P                           Medical Decision Making DANYAL ADORNO is a 61 y.o. female.  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   Based on patient's mechanism of fall and tenderness with abduction of shoulder, suspect possible sprain/strain, rotator cuff injury, possible fracture or dislocation.  Plain films obtained of the left shoulder and left elbow which are personally reviewed showed no fracture or dislocation.  Patient given Toradol in ED and short prescription for Percocet for severe pain.  She was placed in shoulder immobilizer and advised follow-up with orthopedics.  Referral to orthopedics  made.  Strict return precautions.  She is in agreement with plan and safe for discharge.  Amount and/or Complexity of Data Reviewed Radiology: ordered.  Risk Prescription drug management.   Final Clinical Impression(s) / ED Diagnoses Final diagnoses:  Injury of left shoulder, initial encounter    Rx / DC Orders ED Discharge Orders          Ordered    oxyCODONE-acetaminophen (PERCOCET/ROXICET) 5-325 MG tablet  Every 8 hours PRN        09/01/22 2042              Elgie Congo, MD 09/01/22 2043

## 2022-09-04 ENCOUNTER — Ambulatory Visit: Payer: No Typology Code available for payment source | Admitting: Orthopedic Surgery

## 2022-09-04 ENCOUNTER — Ambulatory Visit (INDEPENDENT_AMBULATORY_CARE_PROVIDER_SITE_OTHER): Payer: PRIVATE HEALTH INSURANCE | Admitting: Orthopedic Surgery

## 2022-09-04 ENCOUNTER — Encounter: Payer: Self-pay | Admitting: Orthopedic Surgery

## 2022-09-04 ENCOUNTER — Other Ambulatory Visit: Payer: Self-pay

## 2022-09-04 ENCOUNTER — Other Ambulatory Visit (HOSPITAL_COMMUNITY): Payer: Self-pay

## 2022-09-04 VITALS — BP 142/91 | HR 93 | Ht 62.0 in | Wt 192.0 lb

## 2022-09-04 DIAGNOSIS — M25512 Pain in left shoulder: Secondary | ICD-10-CM | POA: Diagnosis not present

## 2022-09-04 MED ORDER — METHYLPREDNISOLONE ACETATE 40 MG/ML IJ SUSP
40.0000 mg | Freq: Once | INTRAMUSCULAR | Status: AC
  Administered 2022-09-04: 40 mg via INTRA_ARTICULAR

## 2022-09-04 NOTE — Progress Notes (Unsigned)
New Patient Visit  Assessment: [name] [name] is a 61 y.o. female with the following: 1. 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  Pain is in the anterior shoulder.  She is difficulty with range of motion.  Radiographs are negative.  Discussed multiple treatment options, and she is interested in a steroid injection.  This was completed in clinic today.  We will allow her to return to work, light duty only.  No lifting greater than 10 pounds with her left arm.  I will see her back in about a month, to ensure that she continues to improve.  Procedure note injection Left shoulder    Verbal consent was obtained to inject the left shoulder, subacromial space Timeout was completed to confirm the site of injection.  The skin was prepped with alcohol and ethyl chloride was sprayed at the injection site.  A 21-gauge needle was used to inject 40 mg of Depo-Medrol and 1% lidocaine (3 cc) into the subacromial space of the left shoulder using a posterolateral approach.  There were no complications. A sterile bandage was applied.    Follow-up: Return in about 4 weeks (around 10/02/2022).  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 female who presents for evaluation of left shoulder pain.  She works at the [employer].  Few days ago, she was giving a patient shower.  She slipped and fell.  She landed on her left shoulder.  Since then she has had excruciating pain in the anterior aspect of the left shoulder.  She presented to the emergency department.  Radiographs are negative.  She has difficulty with overhead motion.  No prior injuries to her left shoulder.  No pain.  She does have some pain radiating distally, on the lateral aspect of her  shoulder, towards her elbow.  Otherwise, she localizes the pain to the anterior shoulder.   Review of Systems: No fevers or chills No numbness or tingling No chest pain No shortness of breath No bowel or bladder dysfunction No GI distress No headaches   Medical History:  Past Medical History:  Diagnosis Date   Adenocarcinoma of breast ([place])    left    Chronic neck pain 02/10/2016   Heel spur    Hyperlipidemia    Hypertension    Migraines    Obesity     Past Surgical History:  Procedure Laterality Date   BREAST SURGERY     btl  1992   CHOLECYSTECTOMY     COLONOSCOPY  09/15/2011   Procedure: COLONOSCOPY;  Surgeon: Dorothyann Peng, MD;  Location: [facility];  Service: Endoscopy;  Laterality: N/A;  9:15 AM   COLONOSCOPY N/A 08/16/2018   Procedure: COLONOSCOPY;  Surgeon: Danie Binder, MD;  Location: [facility] ENDO [facility];  Service: Endoscopy;  Laterality: N/A;  3:00   left lumpectomy and lymph node disection  2000   POLYPECTOMY  08/16/2018   Procedure: POLYPECTOMY;  Surgeon: Danie Binder, MD;  Location: [facility] ENDO SU[facility];  Service: Endoscopy;;  ascending colon(CSx3), descending colon(CSx1)   r heel surgery for spur and chipped bone  11/09/20099   TUBAL LIGATION      Family History  Problem Relation Age of  Onset   Heart attack Mother    Heart attack Father    Glaucoma Father    Hypertension Father    Diabetes Sister    Anesthesia problems Neg Hx    Hypotension Neg Hx    Malignant hyperthermia Neg Hx    Pseudochol deficiency Neg Hx    Migraines Neg Hx    Social History   Tobacco Use   Smoking status: Every Day    Packs/day: 0.25    Years: 15.00    Total pack years: 3.75    Types: Cigarettes   Smokeless tobacco: Never   Tobacco comments:    smoking 3 per day  Vaping Use   Vaping Use: Never used  Substance Use Topics   Alcohol use: No    Alcohol/week: 0.0 standard drinks of alcohol   Drug use: No    Comment: pt states "3 cigarrettes per day"    Allergies   Allergen Reactions   Ace Inhibitors Cough    Current Meds  Medication Sig   amLODipine (NORVASC) 2.5 MG tablet Take 1 tablet (2.5 mg total) by mouth daily.   Biotin 1000 MCG tablet Take 1,000 mcg by mouth daily at 6 (six) AM.    cyclobenzaprine (FLEXERIL) 10 MG tablet Take 1 tablet (10 mg total) by mouth 3 (three) times daily as needed for muscle spasms.   fluticasone (FLONASE) 50 MCG/ACT nasal spray USE 2 SPRAYS IN EACH NOSTRIL ONCE DAILY   hydrochlorothiazide (HYDRODIURIL) 25 MG tablet Take 1 tablet (25 mg total) by mouth daily.   ibuprofen (ADVIL) 800 MG tablet Take 1 tablet (800 mg total) by mouth 3 (three) times daily.   oxyCODONE-acetaminophen (PERCOCET/ROXICET) 5-325 MG tablet Take 1 tablet by mouth every 8 (eight) hours as needed for up to 9 doses for severe pain.   potassium chloride SA (KLOR-CON M) 20 MEQ tablet Take 1 tablet (20 mEq total) by mouth daily.   Vitamin D, Ergocalciferol, (DRISDOL) 1.25 MG (50000 UNIT) CAPS capsule Take 1 capsule (50,000 Units total) by mouth once a week.    Objective: BP (!) 142/91   Pulse 93   Ht '5\' 2"'$  (1.575 m)   Wt 192 lb (87.1 kg)   BMI 35.12 kg/m   Physical Exam:  General: Alert and oriented. and No acute distress. Gait: Normal gait.  Shoulder without deformity.  No bruising.  No atrophy.  No warmth is appreciated.  Limited forward flexion to approximately 100 degrees.  Passively, she can get beyond this, but it is painful.  Tenderness palpation of the anterior shoulder.  Mild tenderness to palpation laterally.  No tenderness to palpation posterior.  She does have some pain in the mid belly of her biceps as well.  Distal tendon is intact.  IMAGING: I personally reviewed images previously obtained from the ED  X-rays of the left shoulder previously obtained.  Negative for acute injury.   New Medications:  No orders of the defined types were placed in this encounter.     Mordecai Rasmussen, MD  09/04/2022 11:35 AM

## 2022-09-04 NOTE — Patient Instructions (Signed)
Please provide a note for work; ok to return to work 09/05/22.  Light duty.  Limit lifting with left arm to nothing more than 10 pounds.     Instructions Following Joint Injections  In clinic today, you received an injection in one of your joints (sometimes more than one).  Occasionally, you can have some pain at the injection site, this is normal.  You can place ice at the injection site, or take over-the-counter medications such as Tylenol (acetaminophen) or Advil (ibuprofen).  Please follow all directions listed on the bottle.  If your joint (knee or shoulder) becomes swollen, red or very painful, please contact the clinic for additional assistance.   Two medications were injected, including lidocaine and a steroid (often referred to as cortisone).  Lidocaine is effective almost immediately but wears off quickly.  However, the steroid can take a few days to improve your symptoms.  In some cases, it can make your pain worse for a couple of days.  Do not be concerned if this happens as it is common.  You can apply ice or take some over-the-counter medications as needed.

## 2022-09-05 ENCOUNTER — Ambulatory Visit (HOSPITAL_COMMUNITY)
Admission: RE | Admit: 2022-09-05 | Discharge: 2022-09-05 | Disposition: A | Discharge status: Home / Self Care | Payer: No Typology Code available for payment source | Source: Ambulatory Visit | Attending: Family Medicine | Admitting: Family Medicine

## 2022-09-05 DIAGNOSIS — R221 Localized swelling, mass and lump, neck: Secondary | ICD-10-CM | POA: Diagnosis not present

## 2022-09-08 ENCOUNTER — Telehealth: Payer: Self-pay | Admitting: Radiology

## 2022-09-08 NOTE — Telephone Encounter (Signed)
Pt has letter in the system from 12/11 for light duty. Left VM letting pt know letter was done and if she needs a copy to call so we can have it available for her.

## 2022-09-08 NOTE — Telephone Encounter (Signed)
VM was left previously, asking for more detail on work note, not just saying light duty.  Unsure if already addressed.

## 2022-09-11 ENCOUNTER — Encounter: Payer: Self-pay | Admitting: Orthopedic Surgery

## 2022-09-11 ENCOUNTER — Ambulatory Visit (INDEPENDENT_AMBULATORY_CARE_PROVIDER_SITE_OTHER): Payer: PRIVATE HEALTH INSURANCE | Admitting: Orthopedic Surgery

## 2022-09-11 ENCOUNTER — Ambulatory Visit (INDEPENDENT_AMBULATORY_CARE_PROVIDER_SITE_OTHER): Payer: PRIVATE HEALTH INSURANCE

## 2022-09-11 VITALS — BP 160/100 | HR 57 | Ht 62.0 in | Wt 193.6 lb

## 2022-09-11 DIAGNOSIS — W19XXXA Unspecified fall, initial encounter: Secondary | ICD-10-CM

## 2022-09-11 DIAGNOSIS — M79641 Pain in right hand: Secondary | ICD-10-CM

## 2022-09-11 NOTE — Progress Notes (Signed)
Chief Complaint  Patient presents with   New Patient (Initial Visit)    Referred by Dr. Bari Mantis   Hand Injury    RT hand/DOI 08/23/22 Patient fell and landed on both hands but RT hand had swelling and pain/improved    HPI: 61 year old female fell landed on her right hand November 29.  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    Chronic neck pain 02/10/2016   Heel spur    Hyperlipidemia    Hypertension    Migraines    Obesity     BP (!) 160/100   Pulse (!) 57   Ht '5\' 2"'$  (1.575 m)   Wt 193 lb 9.6 oz (87.8 kg)   BMI 35.41 kg/m    General appearance: Well-developed well-nourished no gross deformities  Cardiovascular normal pulse and perfusion normal color without edema  Neurologically no sensation loss or deficits or pathologic reflexes  Psychological: Awake alert and oriented x3 mood and affect normal  Skin no lacerations or ulcerations no nodularity no palpable masses, no erythema or nodularity  Musculoskeletal: Exam of the right hand shows no pain swelling full range of motion  Imaging x-ray was taken to ensure that there was no fracture it was normal  A/P  Relieve follow-up as needed

## 2022-09-26 ENCOUNTER — Other Ambulatory Visit: Payer: Self-pay

## 2022-09-26 ENCOUNTER — Other Ambulatory Visit (HOSPITAL_COMMUNITY): Payer: Self-pay

## 2022-09-26 ENCOUNTER — Other Ambulatory Visit: Payer: Self-pay | Admitting: Family Medicine

## 2022-09-26 DIAGNOSIS — I10 Essential (primary) hypertension: Secondary | ICD-10-CM

## 2022-09-26 MED ORDER — HYDROCHLOROTHIAZIDE 25 MG PO TABS
25.0000 mg | ORAL_TABLET | Freq: Every day | ORAL | 1 refills | Status: DC
  Filled 2022-09-26: qty 90, 90d supply, fill #0
  Filled 2023-01-01: qty 90, 90d supply, fill #1
  Filled 2023-01-01: qty 30, 30d supply, fill #0

## 2022-09-29 ENCOUNTER — Other Ambulatory Visit: Payer: Self-pay | Admitting: Family Medicine

## 2022-09-29 ENCOUNTER — Other Ambulatory Visit (HOSPITAL_COMMUNITY): Payer: Self-pay

## 2022-09-29 DIAGNOSIS — M79671 Pain in right foot: Secondary | ICD-10-CM

## 2022-09-29 DIAGNOSIS — M79675 Pain in left toe(s): Secondary | ICD-10-CM

## 2022-09-29 MED ORDER — IBUPROFEN 800 MG PO TABS
800.0000 mg | ORAL_TABLET | Freq: Three times a day (TID) | ORAL | 0 refills | Status: DC
  Filled 2022-09-29: qty 30, 10d supply, fill #0

## 2022-10-02 ENCOUNTER — Ambulatory Visit: Payer: Self-pay | Admitting: Orthopedic Surgery

## 2022-10-02 ENCOUNTER — Other Ambulatory Visit (HOSPITAL_COMMUNITY): Payer: Self-pay | Admitting: Orthopedic Surgery

## 2022-10-02 DIAGNOSIS — M25512 Pain in left shoulder: Secondary | ICD-10-CM

## 2022-10-05 ENCOUNTER — Ambulatory Visit (HOSPITAL_COMMUNITY)
Admission: RE | Admit: 2022-10-05 | Discharge: 2022-10-05 | Disposition: A | Discharge status: Home / Self Care | Payer: PRIVATE HEALTH INSURANCE | Source: Ambulatory Visit | Attending: Orthopedic Surgery | Admitting: Orthopedic Surgery

## 2022-10-05 DIAGNOSIS — M25512 Pain in left shoulder: Secondary | ICD-10-CM | POA: Diagnosis not present

## 2022-10-13 ENCOUNTER — Other Ambulatory Visit: Payer: Self-pay

## 2022-10-13 ENCOUNTER — Other Ambulatory Visit: Payer: Self-pay | Admitting: Orthopedic Surgery

## 2022-10-13 ENCOUNTER — Encounter (HOSPITAL_BASED_OUTPATIENT_CLINIC_OR_DEPARTMENT_OTHER): Payer: Self-pay | Admitting: Orthopedic Surgery

## 2022-10-16 ENCOUNTER — Encounter (HOSPITAL_BASED_OUTPATIENT_CLINIC_OR_DEPARTMENT_OTHER)
Admission: RE | Admit: 2022-10-16 | Discharge: 2022-10-16 | Disposition: A | Discharge status: Home / Self Care | Payer: PRIVATE HEALTH INSURANCE | Source: Ambulatory Visit | Attending: Orthopedic Surgery | Admitting: Orthopedic Surgery

## 2022-10-16 DIAGNOSIS — Z01812 Encounter for preprocedural laboratory examination: Secondary | ICD-10-CM | POA: Diagnosis not present

## 2022-10-16 LAB — BASIC METABOLIC PANEL
Anion gap: 8 (ref 5–15)
BUN: 16 mg/dL (ref 8–23)
CO2: 28 mmol/L (ref 22–32)
Calcium: 9.9 mg/dL (ref 8.9–10.3)
Chloride: 106 mmol/L (ref 98–111)
Creatinine, Ser: 0.63 mg/dL (ref 0.44–1.00)
GFR, Estimated: >60 mL/min (ref >60)
Glucose, Bld: 86 mg/dL (ref 70–99)
Potassium: 4.8 mmol/L (ref 3.5–5.1)
Sodium: 142 mmol/L (ref 135–145)

## 2022-10-16 NOTE — Progress Notes (Signed)

## 2022-10-17 ENCOUNTER — Other Ambulatory Visit: Payer: Self-pay

## 2022-10-17 ENCOUNTER — Ambulatory Visit (HOSPITAL_BASED_OUTPATIENT_CLINIC_OR_DEPARTMENT_OTHER)
Admission: RE | Admit: 2022-10-17 | Discharge: 2022-10-17 | Disposition: A | Discharge status: Home / Self Care | Payer: PRIVATE HEALTH INSURANCE | Source: Ambulatory Visit | Attending: Orthopedic Surgery | Admitting: Orthopedic Surgery

## 2022-10-17 ENCOUNTER — Ambulatory Visit (HOSPITAL_BASED_OUTPATIENT_CLINIC_OR_DEPARTMENT_OTHER): Payer: PRIVATE HEALTH INSURANCE | Admitting: Anesthesiology

## 2022-10-17 ENCOUNTER — Encounter (HOSPITAL_BASED_OUTPATIENT_CLINIC_OR_DEPARTMENT_OTHER)
Admission: RE | Disposition: A | Discharge status: Home / Self Care | Payer: Self-pay | Source: Ambulatory Visit | Attending: Orthopedic Surgery

## 2022-10-17 ENCOUNTER — Encounter (HOSPITAL_BASED_OUTPATIENT_CLINIC_OR_DEPARTMENT_OTHER): Payer: Self-pay | Admitting: Orthopedic Surgery

## 2022-10-17 ENCOUNTER — Other Ambulatory Visit (HOSPITAL_COMMUNITY): Payer: Self-pay

## 2022-10-17 DIAGNOSIS — M75102 Unspecified rotator cuff tear or rupture of left shoulder, not specified as traumatic: Secondary | ICD-10-CM | POA: Diagnosis not present

## 2022-10-17 DIAGNOSIS — S46012A Strain of muscle(s) and tendon(s) of the rotator cuff of left shoulder, initial encounter: Secondary | ICD-10-CM | POA: Diagnosis not present

## 2022-10-17 DIAGNOSIS — I10 Essential (primary) hypertension: Secondary | ICD-10-CM | POA: Diagnosis not present

## 2022-10-17 DIAGNOSIS — M25812 Other specified joint disorders, left shoulder: Secondary | ICD-10-CM | POA: Insufficient documentation

## 2022-10-17 DIAGNOSIS — F17210 Nicotine dependence, cigarettes, uncomplicated: Secondary | ICD-10-CM | POA: Diagnosis not present

## 2022-10-17 DIAGNOSIS — X58XXXA Exposure to other specified factors, initial encounter: Secondary | ICD-10-CM | POA: Insufficient documentation

## 2022-10-17 DIAGNOSIS — Z79899 Other long term (current) drug therapy: Secondary | ICD-10-CM

## 2022-10-17 DIAGNOSIS — Z01818 Encounter for other preprocedural examination: Secondary | ICD-10-CM

## 2022-10-17 HISTORY — PX: SHOULDER ARTHROSCOPY WITH ROTATOR CUFF REPAIR AND SUBACROMIAL DECOMPRESSION: SHX5686

## 2022-10-17 SURGERY — SHOULDER ARTHROSCOPY WITH ROTATOR CUFF REPAIR AND SUBACROMIAL DECOMPRESSION
Anesthesia: Regional | Site: Shoulder | Laterality: Left

## 2022-10-17 MED ORDER — LABETALOL HCL 5 MG/ML IV SOLN
INTRAVENOUS | Status: AC
  Filled 2022-10-17: qty 4

## 2022-10-17 MED ORDER — SUGAMMADEX SODIUM 200 MG/2ML IV SOLN
INTRAVENOUS | Status: DC | PRN
  Administered 2022-10-17: 300 mg via INTRAVENOUS

## 2022-10-17 MED ORDER — ACETAMINOPHEN 500 MG PO TABS
1000.0000 mg | ORAL_TABLET | Freq: Once | ORAL | Status: AC
  Administered 2022-10-17: 1000 mg via ORAL

## 2022-10-17 MED ORDER — ROCURONIUM BROMIDE 10 MG/ML (PF) SYRINGE
PREFILLED_SYRINGE | INTRAVENOUS | Status: AC
  Filled 2022-10-17: qty 10

## 2022-10-17 MED ORDER — ROCURONIUM BROMIDE 100 MG/10ML IV SOLN
INTRAVENOUS | Status: DC | PRN
  Administered 2022-10-17: 40 mg via INTRAVENOUS

## 2022-10-17 MED ORDER — MIDAZOLAM HCL 2 MG/2ML IJ SOLN
2.0000 mg | Freq: Once | INTRAMUSCULAR | Status: AC
  Administered 2022-10-17: 2 mg via INTRAVENOUS

## 2022-10-17 MED ORDER — MIDAZOLAM HCL 2 MG/2ML IJ SOLN
INTRAMUSCULAR | Status: AC
  Filled 2022-10-17: qty 2

## 2022-10-17 MED ORDER — OXYCODONE-ACETAMINOPHEN 5-325 MG PO TABS
1.0000 | ORAL_TABLET | ORAL | 0 refills | Status: DC | PRN
  Filled 2022-10-17 (×2): qty 20, 4d supply, fill #0

## 2022-10-17 MED ORDER — EPHEDRINE 5 MG/ML INJ
INTRAVENOUS | Status: AC
  Filled 2022-10-17: qty 5

## 2022-10-17 MED ORDER — LIDOCAINE 2% (20 MG/ML) 5 ML SYRINGE
INTRAMUSCULAR | Status: AC
  Filled 2022-10-17: qty 5

## 2022-10-17 MED ORDER — PHENYLEPHRINE 80 MCG/ML (10ML) SYRINGE FOR IV PUSH (FOR BLOOD PRESSURE SUPPORT)
PREFILLED_SYRINGE | INTRAVENOUS | Status: AC
  Filled 2022-10-17: qty 10

## 2022-10-17 MED ORDER — FENTANYL CITRATE (PF) 100 MCG/2ML IJ SOLN
100.0000 ug | Freq: Once | INTRAMUSCULAR | Status: AC
  Administered 2022-10-17: 50 ug via INTRAVENOUS

## 2022-10-17 MED ORDER — PHENYLEPHRINE HCL (PRESSORS) 10 MG/ML IV SOLN
INTRAVENOUS | Status: DC | PRN
  Administered 2022-10-17: 80 ug via INTRAVENOUS
  Administered 2022-10-17: 40 ug via INTRAVENOUS

## 2022-10-17 MED ORDER — FENTANYL CITRATE (PF) 100 MCG/2ML IJ SOLN
INTRAMUSCULAR | Status: AC
  Filled 2022-10-17: qty 2

## 2022-10-17 MED ORDER — BUPIVACAINE HCL (PF) 0.5 % IJ SOLN
INTRAMUSCULAR | Status: DC | PRN
  Administered 2022-10-17: 15 mL via PERINEURAL

## 2022-10-17 MED ORDER — DEXAMETHASONE SODIUM PHOSPHATE 10 MG/ML IJ SOLN
INTRAMUSCULAR | Status: AC
  Filled 2022-10-17: qty 1

## 2022-10-17 MED ORDER — CEFAZOLIN SODIUM-DEXTROSE 2-4 GM/100ML-% IV SOLN
INTRAVENOUS | Status: AC
  Filled 2022-10-17: qty 100

## 2022-10-17 MED ORDER — EPHEDRINE SULFATE (PRESSORS) 50 MG/ML IJ SOLN
INTRAMUSCULAR | Status: DC | PRN
  Administered 2022-10-17: 10 mg via INTRAVENOUS

## 2022-10-17 MED ORDER — SUGAMMADEX SODIUM 500 MG/5ML IV SOLN
INTRAVENOUS | Status: AC
  Filled 2022-10-17: qty 5

## 2022-10-17 MED ORDER — PROPOFOL 10 MG/ML IV BOLUS
INTRAVENOUS | Status: DC | PRN
  Administered 2022-10-17: 150 mg via INTRAVENOUS

## 2022-10-17 MED ORDER — DEXAMETHASONE SODIUM PHOSPHATE 4 MG/ML IJ SOLN
INTRAMUSCULAR | Status: DC | PRN
  Administered 2022-10-17: 5 mg via INTRAVENOUS

## 2022-10-17 MED ORDER — ONDANSETRON HCL 4 MG/2ML IJ SOLN
INTRAMUSCULAR | Status: DC | PRN
  Administered 2022-10-17: 4 mg via INTRAVENOUS

## 2022-10-17 MED ORDER — FENTANYL CITRATE (PF) 100 MCG/2ML IJ SOLN
INTRAMUSCULAR | Status: DC | PRN
  Administered 2022-10-17 (×2): 25 ug via INTRAVENOUS

## 2022-10-17 MED ORDER — LACTATED RINGERS IV SOLN: INTRAVENOUS | Status: DC

## 2022-10-17 MED ORDER — BUPIVACAINE LIPOSOME 1.3 % IJ SUSP
INTRAMUSCULAR | Status: DC | PRN
  Administered 2022-10-17: 10 mL via PERINEURAL

## 2022-10-17 MED ORDER — ONDANSETRON HCL 4 MG/2ML IJ SOLN
INTRAMUSCULAR | Status: AC
  Filled 2022-10-17: qty 2

## 2022-10-17 MED ORDER — LIDOCAINE HCL (CARDIAC) PF 100 MG/5ML IV SOSY
PREFILLED_SYRINGE | INTRAVENOUS | Status: DC | PRN
  Administered 2022-10-17: 80 mg via INTRAVENOUS

## 2022-10-17 MED ORDER — SODIUM CHLORIDE 0.9 % IR SOLN
Status: DC | PRN
  Administered 2022-10-17: 7000 mL

## 2022-10-17 MED ORDER — CEFAZOLIN SODIUM-DEXTROSE 2-4 GM/100ML-% IV SOLN
2.0000 g | INTRAVENOUS | Status: AC
  Administered 2022-10-17: 2 g via INTRAVENOUS

## 2022-10-17 MED ORDER — SUCCINYLCHOLINE CHLORIDE 200 MG/10ML IV SOSY
PREFILLED_SYRINGE | INTRAVENOUS | Status: AC
  Filled 2022-10-17: qty 10

## 2022-10-17 SURGICAL SUPPLY — 60 items
AID PSTN UNV HD RSTRNT DISP (MISCELLANEOUS) ×1
ANCH SUT 2 SWLK 19.1 KNTLS LP (Anchor) ×2 IMPLANT
ANCH SUT SWLK 19.1X4.75 VT (Anchor) ×2 IMPLANT
ANCHOR PEEK 4.75X19.1 SWLK C (Anchor) IMPLANT
ANCHOR SUT SWIVELOCK 4.75X19.1 (Anchor) IMPLANT
APL PRP STRL LF DISP 70% ISPRP (MISCELLANEOUS) ×1
APL SKNCLS STERI-STRIP NONHPOA (GAUZE/BANDAGES/DRESSINGS)
BENZOIN TINCTURE PRP APPL 2/3 (GAUZE/BANDAGES/DRESSINGS) IMPLANT
BURR OVAL 8 FLU 4.0X13 (MISCELLANEOUS) ×1 IMPLANT
CANNULA 5.75X7 CRYSTAL CLEAR (CANNULA) ×1 IMPLANT
CANNULA TWIST IN 8.25X7CM (CANNULA) IMPLANT
CHLORAPREP W/TINT 26 (MISCELLANEOUS) ×1 IMPLANT
CUTTER BONE 4.0MM X 13CM (MISCELLANEOUS) ×1 IMPLANT
DRAPE IMP U-DRAPE 54X76 (DRAPES) ×1 IMPLANT
DRAPE INCISE IOBAN 66X45 STRL (DRAPES) ×1 IMPLANT
DRAPE POUCH INSTRU U-SHP 10X18 (DRAPES) ×1 IMPLANT
DRAPE STERI 35X30 U-POUCH (DRAPES) ×1 IMPLANT
DRAPE SURG 17X23 STRL (DRAPES) ×1 IMPLANT
DRAPE U-SHAPE 47X51 STRL (DRAPES) ×1 IMPLANT
DRAPE U-SHAPE 76X120 STRL (DRAPES) ×2 IMPLANT
ELECT REM PT RETURN 9FT ADLT (ELECTROSURGICAL) ×1
ELECTRODE REM PT RTRN 9FT ADLT (ELECTROSURGICAL) ×1 IMPLANT
GAUZE 4X4 16PLY ~~LOC~~+RFID DBL (SPONGE) IMPLANT
GAUZE PAD ABD 8X10 STRL (GAUZE/BANDAGES/DRESSINGS) ×1 IMPLANT
GAUZE SPONGE 4X4 12PLY STRL (GAUZE/BANDAGES/DRESSINGS) ×1 IMPLANT
GAUZE XEROFORM 1X8 LF (GAUZE/BANDAGES/DRESSINGS) ×1 IMPLANT
GLOVE BIO SURGEON STRL SZ7.5 (GLOVE) ×1 IMPLANT
GLOVE BIOGEL PI IND STRL 6.5 (GLOVE) ×1 IMPLANT
GLOVE BIOGEL PI IND STRL 8 (GLOVE) ×1 IMPLANT
GLOVE SURG SS PI 6.5 STRL IVOR (GLOVE) ×1 IMPLANT
GOWN STRL REUS W/ TWL LRG LVL3 (GOWN DISPOSABLE) ×2 IMPLANT
GOWN STRL REUS W/ TWL XL LVL3 (GOWN DISPOSABLE) ×1 IMPLANT
GOWN STRL REUS W/TWL LRG LVL3 (GOWN DISPOSABLE) ×2
GOWN STRL REUS W/TWL XL LVL3 (GOWN DISPOSABLE) ×1
LASSO CRESCENT QUICKPASS (SUTURE) IMPLANT
MANIFOLD NEPTUNE II (INSTRUMENTS) ×1 IMPLANT
NDL HD SCORPION MEGA LOADER (NEEDLE) IMPLANT
NS IRRIG 1000ML POUR BTL (IV SOLUTION) IMPLANT
PACK ARTHROSCOPY DSU (CUSTOM PROCEDURE TRAY) ×1 IMPLANT
PACK BASIN DAY SURGERY FS (CUSTOM PROCEDURE TRAY) ×1 IMPLANT
PROBE BIPOLAR ATHRO 135MM 90D (MISCELLANEOUS) ×1 IMPLANT
RESTRAINT HEAD UNIVERSAL NS (MISCELLANEOUS) ×1 IMPLANT
SLEEVE SCD COMPRESS KNEE MED (STOCKING) ×1 IMPLANT
SLING ARM FOAM STRAP LRG (SOFTGOODS) IMPLANT
SPIKE FLUID TRANSFER (MISCELLANEOUS) IMPLANT
STRIP CLOSURE SKIN 1/2X4 (GAUZE/BANDAGES/DRESSINGS) IMPLANT
SUPPORT WRAP ARM LG (MISCELLANEOUS) ×1 IMPLANT
SUT ETHILON 3 0 PS 1 (SUTURE) ×1 IMPLANT
SUT FIBERWIRE #2 38 T-5 BLUE (SUTURE)
SUT PDS AB 0 CT 36 (SUTURE) IMPLANT
SUT TIGER TAPE 7 IN WHITE (SUTURE) IMPLANT
SUTURE FIBERWR #2 38 T-5 BLUE (SUTURE) IMPLANT
SUTURE TAPE 1.3 40 TPR END (SUTURE) IMPLANT
SUTURETAPE 1.3 40 TPR END (SUTURE)
TAPE FIBER 2MM 7IN #2 BLUE (SUTURE) IMPLANT
TOWEL GREEN STERILE FF (TOWEL DISPOSABLE) ×1 IMPLANT
TUBE CONNECTING 20X1/4 (TUBING) ×1 IMPLANT
TUBING ARTHROSCOPY IRRIG 16FT (MISCELLANEOUS) ×1 IMPLANT
WAND ABLATOR APOLLO I90 (BUR) IMPLANT
WATER STERILE IRR 1000ML POUR (IV SOLUTION) ×1 IMPLANT

## 2022-10-17 NOTE — H&P (Signed)
[name] [name] is an 62 y.o. female.   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    Chronic neck pain 02/10/2016   Heel spur    Hyperlipidemia    Hypertension    Migraines    Obesity     Past Surgical History:  Procedure Laterality Date   BREAST SURGERY     btl  1992   CHOLECYSTECTOMY     COLONOSCOPY  09/15/2011   Procedure: COLONOSCOPY;  Surgeon: Dorothyann Peng, MD;  [facility];  Service: Endoscopy;  Laterality: N/A;  9:15 AM   COLONOSCOPY N/A 08/16/2018   Procedure: COLONOSCOPY;  Surgeon: Danie Binder, MD;  [facility];  Service: Endoscopy;  Laterality: N/A;  3:00   left lumpectomy and lymph node disection  2000   POLYPECTOMY  08/16/2018   Procedure: POLYPECTOMY;  Surgeon: Danie Binder, MD;  [facility];  Service: Endoscopy;;  ascending colon(CSx3), descending colon(CSx1)   r heel surgery for spur and chipped bone  11/09/20099   TUBAL LIGATION      Family History  Problem Relation Age of Onset   Heart attack Mother    Heart attack Father    Glaucoma Father    Hypertension Father    Diabetes Sister    Anesthesia problems Neg Hx    Hypotension Neg Hx    Malignant hyperthermia Neg Hx    Pseudochol deficiency Neg Hx    Migraines Neg Hx    Social History:  reports that she has been smoking cigarettes. She has a 3.75 pack-year smoking history. She has never used smokeless tobacco. She reports that she does not drink alcohol and does not use drugs.  Allergies:  Allergies  Allergen Reactions   Ace Inhibitors Cough    Medications Prior to Admission  Medication Sig Dispense Refill   amLODipine (NORVASC) 2.5 MG tablet Take 1 tablet (2.5 mg total) by mouth daily. 90 tablet 1   fluticasone (FLONASE) 50 MCG/ACT nasal spray USE 2 SPRAYS IN EACH NOSTRIL ONCE DAILY 48 g 1    hydrochlorothiazide (HYDRODIURIL) 25 MG tablet Take 1 tablet (25 mg total) by mouth daily. 90 tablet 1   ibuprofen (ADVIL) 800 MG tablet Take 1 tablet (800 mg total) by mouth 3 (three) times daily. 30 tablet 0   potassium chloride SA (KLOR-CON M) 20 MEQ tablet Take 1 tablet (20 mEq total) by mouth daily. 90 tablet 1   Vitamin D, Ergocalciferol, (DRISDOL) 1.25 MG (50000 UNIT) CAPS capsule Take 1 capsule (50,000 Units total) by mouth once a week. 16 capsule 3   Biotin 1000 MCG tablet Take 1,000 mcg by mouth daily at 6 (six) AM.      oxyCODONE-acetaminophen (PERCOCET/ROXICET) 5-325 MG tablet Take 1 tablet by mouth every 8 (eight) hours as needed for up to 9 doses for severe pain. 9 tablet 0    Results for orders placed or performed during the hospital encounter of 10/17/22 (from the past 48 hour(s))  Basic metabolic panel per protocol     Status: None   Collection Time: 10/16/22 10:00 AM  Result Value Ref Range   Sodium 142 135 - 145 mmol/L   Potassium 4.8 3.5 - 5.1 mmol/L   Chloride 106 98 - 111 mmol/L   CO2 28 22 - 32 mmol/L   Glucose,  Bld 86 70 - 99 mg/dL    Comment: Glucose reference range applies only to samples taken after fasting for at least 8 hours.   BUN 16 8 - 23 mg/dL   Creatinine, Ser 0.63 0.44 - 1.00 mg/dL   Calcium 9.9 8.9 - 10.3 mg/dL   GFR, Estimated >60 >60 mL/min    Comment: (NOTE) Calculated using the CKD-EPI Creatinine Equation (2021)    Anion gap 8 5 - 15    Comment: Performed at [facility] [address], [place], [place] [address]   No results found.  Review of Systems  All other systems reviewed and are negative.   Blood pressure 107/70, pulse 75, temperature 98.5 F (36.9 C), temperature source Oral, resp. rate 17, height '5\' 2"'$  (1.575 m), weight 84.1 kg, SpO2 97 %. Physical Exam HENT:     Head: Atraumatic.  Cardiovascular:     Pulses: Normal pulses.  Musculoskeletal:     Comments: L shoulder pain with RC testing. NVID  Neurological:      Mental Status: She is alert.      Assessment/Plan Left shoulder full-thickness rotator cuff tear after an injury at work Plan left shoulder arthroscopic rotator cuff repair, SAD Risks / benefits of surgery discussed Consent on chart  NPO for OR Preop antibiotics   Rhae Hammock, MD 10/17/2022, 2:32 PM

## 2022-10-17 NOTE — Progress Notes (Signed)
Assisted Dr. Lanetta Inch with left, interscalene , ultrasound guided block. Side rails up, monitors on throughout procedure. See vital signs in flow sheet. Tolerated Procedure well.

## 2022-10-17 NOTE — Transfer of Care (Signed)
Immediate Anesthesia Transfer of Care Note  Patient: [name] [name]  Procedure(s) Performed: SHOULDER ARTHROSCOPY WITH ROTATOR CUFF REPAIR AND SUBACROMIAL DECOMPRESSION (Left: Shoulder)  Patient Location: PACU  Anesthesia Type:GA combined with regional for post-op pain  Level of Consciousness: drowsy and patient cooperative  Airway & Oxygen Therapy: Patient Spontanous Breathing and Patient connected to face mask oxygen  Post-op Assessment: Report given to RN and Post -op Vital signs reviewed and stable  Post vital signs: Reviewed and stable  Last Vitals:  Vitals Value Taken Time  BP 120/73 10/17/22 1632  Temp    Pulse 71 10/17/22 1633  Resp 17 10/17/22 1633  SpO2 98 % 10/17/22 1633  Vitals shown include unvalidated device data.  Last Pain:  Vitals:   10/17/22 1236  TempSrc: Oral  PainSc: 4       Patients Stated Pain Goal: 3 (85/27/78 2423)  Complications: No notable events documented.

## 2022-10-17 NOTE — Anesthesia Procedure Notes (Signed)
Procedure Name: Intubation Date/Time: 10/17/2022 3:05 PM  Performed by: Willa Frater, CRNAPre-anesthesia Checklist: Patient identified, Emergency Drugs available, Suction available and Patient being monitored Patient Re-evaluated:Patient Re-evaluated prior to induction Oxygen Delivery Method: Circle system utilized Preoxygenation: Pre-oxygenation with 100% oxygen Induction Type: IV induction Ventilation: Mask ventilation without difficulty Laryngoscope Size: Mac and 3 Grade View: Grade I Tube type: Oral Tube size: 7.0 mm Number of attempts: 1 Airway Equipment and Method: Stylet and Oral airway Placement Confirmation: ETT inserted through vocal cords under direct vision, positive ETCO2 and breath sounds checked- equal and bilateral Secured at: 22 cm Tube secured with: Tape Dental Injury: Teeth and Oropharynx as per pre-operative assessment

## 2022-10-17 NOTE — Anesthesia Preprocedure Evaluation (Addendum)
Anesthesia Evaluation  Patient identified by MRN, date of birth, ID band Patient awake    Reviewed: Allergy & Precautions, NPO status , Patient's Chart, lab work & pertinent test results  Airway Mallampati: II  TM Distance: >3 FB Neck ROM: Full    Dental no notable dental hx. (+) Teeth Intact, Dental Advisory Given   Pulmonary Current Smoker   Pulmonary exam normal breath sounds clear to auscultation       Cardiovascular hypertension, Pt. on medications Normal cardiovascular exam Rhythm:Regular Rate:Normal     Neuro/Psych  Headaches  negative psych ROS   GI/Hepatic negative GI ROS, Neg liver ROS,,,  Endo/Other  negative endocrine ROS    Renal/GU negative Renal ROS  negative genitourinary   Musculoskeletal  (+) Arthritis ,    Abdominal   Peds  Hematology negative hematology ROS (+)   Anesthesia Other Findings   Reproductive/Obstetrics                             Anesthesia Physical Anesthesia Plan  ASA: 2  Anesthesia Plan: General and Regional   Post-op Pain Management: Regional block* and Tylenol PO (pre-op)*   Induction: Intravenous  PONV Risk Score and Plan: 2 and Midazolam, Dexamethasone and Ondansetron  Airway Management Planned: Oral ETT  Additional Equipment:   Intra-op Plan:   Post-operative Plan: Extubation in OR  Informed Consent: I have reviewed the patients History and Physical, chart, labs and discussed the procedure including the risks, benefits and alternatives for the proposed anesthesia with the patient or authorized representative who has indicated his/her understanding and acceptance.     Dental advisory given  Plan Discussed with: CRNA  Anesthesia Plan Comments:        Anesthesia Quick Evaluation

## 2022-10-17 NOTE — Op Note (Signed)
Procedure(s): SHOULDER ARTHROSCOPY WITH ROTATOR CUFF REPAIR AND SUBACROMIAL DECOMPRESSION Procedure Note  NEYDA DURANGO female 62 y.o. 10/17/2022  Preoperative diagnosis:  #1 left shoulder traumatic rotator cuff tear #2 left shoulder impingement with unfavorable acromial anatomy  Postoperative diagnosis: Same   Procedure(s) and Anesthesia Type:    * SHOULDER ARTHROSCOPY WITH ROTATOR CUFF REPAIR AND SUBACROMIAL DECOMPRESSION - General  Surgeon(s) and Role:    Tania Ade, MD - Primary     Surgeon: Rhae Hammock   Assistants: None  Anesthesia: General endotracheal anesthesia with preoperative interscalene block given by the attending anesthesiologist    Procedure Detail  SHOULDER ARTHROSCOPY WITH ROTATOR CUFF REPAIR AND SUBACROMIAL DECOMPRESSION  Estimated Blood Loss: Min         Drains: none  Blood Given: none         Specimens: none        Complications:  * No complications entered in OR log *         Disposition: PACU - hemodynamically stable.         Condition: stable    Procedure:   INDICATIONS FOR SURGERY: The patient is 62 y.o. female who was injured at work and suffered a left shoulder full-thickness rotator cuff tear.  Indicated for surgical management to promote anatomic healing and full recovery.  OPERATIVE FINDINGS: Examination under anesthesia: No stiffness or instability.   DESCRIPTION OF PROCEDURE: The patient was identified in preoperative  holding area where I personally marked the operative site after  verifying site, side, and procedure with the patient. An interscalene block was given by the attending anesthesiologist the holding area.  The patient was taken back to the operating room where general anesthesia was induced without complication and was placed in the beach-chair position with the back  elevated about 60 degrees and all extremities and head and neck carefully padded and  positioned.   The left upper extremity was  then prepped and  draped in a standard sterile fashion. The appropriate time-out  procedure was carried out. The patient did receive IV antibiotics  within 30 minutes of incision.   A small posterior portal incision was made and the arthroscope was introduced into the joint. An anterior portal was then established above the subscapularis using needle localization. Small cannula was placed anteriorly. Diagnostic arthroscopy was then carried out the subscapularis was intact.  She had some synovitis in the joint that was debrided.  She had some partial tearing of the superior labrum which was debrided back to stable labrum.  The biceps tendon was intact.  There was full-thickness tearing of the supraspinatus with minimal retraction.  This was lightly debrided from the undersurface.  The arthroscope was then introduced into the subacromial space a standard lateral portal was established with needle localization. The shaver was used through the lateral portal to perform extensive bursectomy.  The bursal surface of the rotator cuff tear was identified and debrided back to healthy tendon.  The tuberosity was debrided down to a bleeding bony surface to promote healing.  The repair was then carried out using 2 4.75 peek swivel lock anchors each loaded with fiber tape just off the articular margin.  These 4 suture strands were passed evenly throughout the tear and brought over to 2 additional 4.75 peek swivel lock anchors.  The knotless suture mechanism was also used from the anterior lateral anchor to add additional fixation.  The coracoacromial ligament was taken down off the anterior acromion with the ArthroCare exposing  a moderate hooked anterior acromial spur. A high-speed bur was then used through the lateral portal to take down the anterior acromial spur from lateral to medial in a standard acromioplasty.  The acromioplasty was also viewed from the lateral portal and the bur was used as necessary to ensure  that the acromion was completely flat from posterior to anterior.  The arthroscopic equipment was removed from the joint and the portals were closed with 3-0 nylon in an interrupted fashion. Sterile dressings were then applied including Xeroform 4 x 4's ABDs and tape. The patient was then allowed to awaken from general anesthesia, placed in a sling, transferred to the stretcher and taken to the recovery room in stable condition.   POSTOPERATIVE PLAN: The patient will be discharged home today and will followup in one week for suture removal and wound check.

## 2022-10-17 NOTE — Discharge Instructions (Addendum)
Discharge Instructions after Arthroscopic Shoulder Repair   A sling has been provided for you. Remain in your sling at all times. This includes sleeping in your sling.  Use ice on the shoulder intermittently over the first 48 hours after surgery.  Pain medicine has been prescribed for you.  Use your medicine liberally over the first 48 hours, and then you can begin to taper your use. You may take Extra Strength Tylenol or Tylenol only in place of the pain pills. DO NOT take ANY nonsteroidal anti-inflammatory pain medications: Advil, Motrin, Ibuprofen, Aleve, Naproxen, or Narprosyn.  You may remove your dressing after two days. If the incision sites are still moist, place a Band-Aid over the moist site(s). Change Band-Aids daily until dry.  You may shower 5 days after surgery. The incisions CANNOT get wet prior to 5 days. Simply allow the water to wash over the site and then pat dry. Do not rub the incisions. Make sure your axilla (armpit) is completely dry after showering.  Take one aspirin a day for 2 weeks after surgery, unless you have an aspirin sensitivity/ allergy or asthma.   Please call [phone] during normal business hours or [phone] after hours for any problems. Including the following:  - excessive redness of the incisions - drainage for more than 4 days - fever of more than 101.5 F  *Please note that pain medications will not be refilled after hours or on weekends.   Post Anesthesia Home Care Instructions  Activity: Get plenty of rest for the remainder of the day. A responsible individual must stay with you for 24 hours following the procedure.  For the next 24 hours, DO NOT: -Drive a car -Paediatric nurse -Drink alcoholic beverages -Take any medication unless instructed by your physician -Make any legal decisions or sign important papers.  Meals: Start with liquid foods such as gelatin or soup. Progress to regular foods as tolerated. Avoid greasy, spicy, heavy  foods. If nausea and/or vomiting occur, drink only clear liquids until the nausea and/or vomiting subsides. Call your physician if vomiting continues.  Special Instructions/Symptoms: Your throat may feel dry or sore from the anesthesia or the breathing tube placed in your throat during surgery. If this causes discomfort, gargle with warm salt water. The discomfort should disappear within 24 hours.  If you had a scopolamine patch placed behind your ear for the management of post- operative nausea and/or vomiting:  1. The medication in the patch is effective for 72 hours, after which it should be removed.  Wrap patch in a tissue and discard in the trash. Wash hands thoroughly with soap and water. 2. You may remove the patch earlier than 72 hours if you experience unpleasant side effects which may include dry mouth, dizziness or visual disturbances. 3. Avoid touching the patch. Wash your hands with soap and water after contact with the patch.    May have tylenol after 7:29 pm.

## 2022-10-17 NOTE — Anesthesia Procedure Notes (Signed)
Anesthesia Regional Block: Interscalene brachial plexus block   Pre-Anesthetic Checklist: , timeout performed,  Correct Patient, Correct Site, Correct Laterality,  Correct Procedure, Correct Position, site marked,  Risks and benefits discussed,  Pre-op evaluation,  At surgeon's request and post-op pain management  Laterality: Left  Prep: Maximum Sterile Barrier Precautions used, chloraprep       Needles:  Injection technique: Single-shot  Needle Type: Echogenic Stimulator Needle     Needle Length: 5cm  Needle Gauge: 21     Additional Needles:   Procedures:,,,, ultrasound used (permanent image in chart),,    Narrative:  Start time: 10/17/2022 2:03 PM End time: 10/17/2022 2:07 PM Injection made incrementally with aspirations every 5 mL. Anesthesiologist: Freddrick March, MD

## 2022-10-18 ENCOUNTER — Encounter (HOSPITAL_BASED_OUTPATIENT_CLINIC_OR_DEPARTMENT_OTHER): Payer: Self-pay | Admitting: Orthopedic Surgery

## 2022-10-18 NOTE — Anesthesia Postprocedure Evaluation (Signed)
Anesthesia Post Note  Patient: [name] [name]  Procedure(s) Performed: SHOULDER ARTHROSCOPY WITH ROTATOR CUFF REPAIR AND SUBACROMIAL DECOMPRESSION (Left: Shoulder)     Patient location during evaluation: PACU Anesthesia Type: Regional and General Level of consciousness: sedated and patient cooperative Pain management: pain level controlled Vital Signs Assessment: post-procedure vital signs reviewed and stable Respiratory status: spontaneous breathing Cardiovascular status: stable Anesthetic complications: no   No notable events documented.  Last Vitals:  Vitals:   10/17/22 1700 10/17/22 1730  BP: 124/75 127/79  Pulse: 74 73  Resp:    Temp:  36.8 C  SpO2: 95% 96%    Last Pain:  Vitals:   10/18/22 0957  TempSrc:   PainSc: [place]erv[place]

## 2022-10-19 ENCOUNTER — Other Ambulatory Visit (HOSPITAL_COMMUNITY): Payer: Self-pay

## 2022-10-19 MED ORDER — ONDANSETRON HCL 4 MG PO TABS
4.0000 mg | ORAL_TABLET | Freq: Four times a day (QID) | ORAL | 0 refills | Status: DC
  Filled 2022-10-19: qty 15, 4d supply, fill #0

## 2022-10-20 ENCOUNTER — Other Ambulatory Visit (HOSPITAL_COMMUNITY): Payer: Self-pay

## 2022-10-20 ENCOUNTER — Other Ambulatory Visit: Payer: Self-pay

## 2022-10-30 ENCOUNTER — Other Ambulatory Visit (HOSPITAL_COMMUNITY): Payer: Self-pay

## 2022-10-30 ENCOUNTER — Other Ambulatory Visit: Payer: Self-pay

## 2022-11-23 ENCOUNTER — Other Ambulatory Visit: Payer: Self-pay | Admitting: Family Medicine

## 2022-11-23 ENCOUNTER — Encounter: Payer: Self-pay | Admitting: Radiology

## 2022-11-23 ENCOUNTER — Other Ambulatory Visit (HOSPITAL_COMMUNITY): Payer: Self-pay

## 2022-11-23 DIAGNOSIS — M79671 Pain in right foot: Secondary | ICD-10-CM

## 2022-11-23 DIAGNOSIS — M79675 Pain in left toe(s): Secondary | ICD-10-CM

## 2022-11-28 ENCOUNTER — Other Ambulatory Visit (HOSPITAL_COMMUNITY): Payer: Self-pay

## 2022-12-01 ENCOUNTER — Telehealth: Payer: Self-pay | Admitting: Family Medicine

## 2022-12-01 ENCOUNTER — Other Ambulatory Visit (HOSPITAL_COMMUNITY): Payer: Self-pay

## 2022-12-01 ENCOUNTER — Other Ambulatory Visit: Payer: Self-pay | Admitting: Family Medicine

## 2022-12-01 DIAGNOSIS — M79675 Pain in left toe(s): Secondary | ICD-10-CM

## 2022-12-01 DIAGNOSIS — M79671 Pain in right foot: Secondary | ICD-10-CM

## 2022-12-01 MED ORDER — IBUPROFEN 800 MG PO TABS
800.0000 mg | ORAL_TABLET | Freq: Three times a day (TID) | ORAL | 1 refills | Status: DC | PRN
  Filled 2022-12-01 – 2022-12-05 (×2): qty 30, 10d supply, fill #0
  Filled 2023-04-02: qty 30, 10d supply, fill #1

## 2022-12-01 NOTE — Telephone Encounter (Signed)
Med sent and she is aware

## 2022-12-01 NOTE — Telephone Encounter (Signed)
Patient requesting refill on  ibuprofen (ADVIL) 800 MG tablet [id number]    Wants a call back in regard.

## 2022-12-05 ENCOUNTER — Other Ambulatory Visit (HOSPITAL_COMMUNITY): Payer: Self-pay

## 2022-12-05 ENCOUNTER — Other Ambulatory Visit: Payer: Self-pay

## 2022-12-06 ENCOUNTER — Other Ambulatory Visit (HOSPITAL_COMMUNITY): Payer: Self-pay

## 2022-12-06 ENCOUNTER — Encounter (HOSPITAL_COMMUNITY): Payer: Self-pay

## 2022-12-08 ENCOUNTER — Other Ambulatory Visit (HOSPITAL_COMMUNITY): Payer: Self-pay

## 2023-01-01 ENCOUNTER — Other Ambulatory Visit (HOSPITAL_COMMUNITY): Payer: Self-pay

## 2023-01-02 ENCOUNTER — Other Ambulatory Visit (HOSPITAL_COMMUNITY): Payer: Self-pay

## 2023-01-26 ENCOUNTER — Ambulatory Visit (INDEPENDENT_AMBULATORY_CARE_PROVIDER_SITE_OTHER): Payer: 59 | Admitting: Family Medicine

## 2023-01-26 ENCOUNTER — Other Ambulatory Visit: Payer: Self-pay

## 2023-01-26 ENCOUNTER — Other Ambulatory Visit (HOSPITAL_BASED_OUTPATIENT_CLINIC_OR_DEPARTMENT_OTHER): Payer: Self-pay

## 2023-01-26 ENCOUNTER — Encounter: Payer: Self-pay | Admitting: Family Medicine

## 2023-01-26 VITALS — BP 128/84 | HR 82 | Ht 62.0 in | Wt 197.1 lb

## 2023-01-26 DIAGNOSIS — I10 Essential (primary) hypertension: Secondary | ICD-10-CM | POA: Diagnosis not present

## 2023-01-26 DIAGNOSIS — Z1231 Encounter for screening mammogram for malignant neoplasm of breast: Secondary | ICD-10-CM

## 2023-01-26 DIAGNOSIS — E7849 Other hyperlipidemia: Secondary | ICD-10-CM | POA: Diagnosis not present

## 2023-01-26 DIAGNOSIS — J302 Other seasonal allergic rhinitis: Secondary | ICD-10-CM | POA: Diagnosis not present

## 2023-01-26 DIAGNOSIS — R7303 Prediabetes: Secondary | ICD-10-CM

## 2023-01-26 MED ORDER — AMLODIPINE BESYLATE 2.5 MG PO TABS
2.5000 mg | ORAL_TABLET | Freq: Every day | ORAL | 1 refills | Status: DC
  Filled 2023-01-26: qty 30, 30d supply, fill #0
  Filled 2023-03-02: qty 30, 30d supply, fill #1
  Filled 2023-04-02: qty 30, 30d supply, fill #2
  Filled 2023-04-30: qty 30, 30d supply, fill #3
  Filled 2023-06-05: qty 30, 30d supply, fill #4
  Filled 2023-07-02: qty 30, 30d supply, fill #5

## 2023-01-26 MED ORDER — POTASSIUM CHLORIDE CRYS ER 20 MEQ PO TBCR
20.0000 meq | EXTENDED_RELEASE_TABLET | Freq: Every day | ORAL | 1 refills | Status: DC
  Filled 2023-01-26: qty 30, 30d supply, fill #0
  Filled 2023-03-02: qty 30, 30d supply, fill #1
  Filled 2023-04-02: qty 30, 30d supply, fill #2
  Filled 2023-04-30: qty 30, 30d supply, fill #3
  Filled 2023-06-05: qty 30, 30d supply, fill #4
  Filled 2023-07-02: qty 30, 30d supply, fill #5

## 2023-01-26 MED ORDER — HYDROCHLOROTHIAZIDE 25 MG PO TABS
25.0000 mg | ORAL_TABLET | Freq: Every day | ORAL | 1 refills | Status: DC
  Filled 2023-01-26: qty 30, 30d supply, fill #0
  Filled 2023-03-02: qty 30, 30d supply, fill #1
  Filled 2023-04-02: qty 30, 30d supply, fill #2
  Filled 2023-04-30: qty 30, 30d supply, fill #3
  Filled 2023-06-05: qty 30, 30d supply, fill #4
  Filled 2023-07-02: qty 30, 30d supply, fill #5

## 2023-01-26 MED ORDER — VITAMIN D (ERGOCALCIFEROL) 1.25 MG (50000 UNIT) PO CAPS
50000.0000 [IU] | ORAL_CAPSULE | ORAL | 3 refills | Status: DC
  Filled 2023-01-26: qty 4, 28d supply, fill #0
  Filled 2023-03-02: qty 4, 28d supply, fill #1
  Filled 2023-04-02: qty 4, 28d supply, fill #2
  Filled 2023-04-30: qty 4, 28d supply, fill #3
  Filled 2023-05-22: qty 4, 28d supply, fill #4
  Filled 2023-06-21: qty 4, 28d supply, fill #5
  Filled 2023-07-20: qty 4, 28d supply, fill #6
  Filled 2023-08-20 – 2023-08-24 (×2): qty 4, 28d supply, fill #7
  Filled 2023-09-14: qty 4, 28d supply, fill #8
  Filled 2023-10-04 – 2023-10-22 (×2): qty 4, 28d supply, fill #9
  Filled 2023-11-14: qty 4, 28d supply, fill #10
  Filled 2023-11-29: qty 4, 28d supply, fill #11
  Filled 2024-01-01: qty 4, 28d supply, fill #12

## 2023-01-26 NOTE — Assessment & Plan Note (Signed)
Patient educated about the importance of limiting  Carbohydrate intake , the need to commit to daily physical activity for a minimum of 30 minutes , and to commit weight loss. The fact that changes in all these areas will reduce or eliminate all together the development of diabetes is stressed.      Latest Ref Rng & Units 10/16/2022   10:00 AM 08/28/2022    9:46 AM 05/15/2022    9:21 AM 02/13/2022   11:01 AM 01/31/2022   12:38 PM  Diabetic Labs  HbA1c 4.8 - 5.6 %  6.1   5.9    Chol 100 - 199 mg/dL  295   621    HDL >30 mg/dL  60   67    Calc LDL 0 - 99 mg/dL  865   784    Triglycerides 0 - 149 mg/dL  90   94    Creatinine 0.44 - 1.00 mg/dL 6.96  2.95  2.84  1.32  0.58       01/26/2023    8:54 AM 01/26/2023    8:38 AM 10/17/2022    5:30 PM 10/17/2022    5:00 PM 10/17/2022    4:45 PM 10/17/2022    4:30 PM 10/17/2022    2:10 PM  BP/Weight  Systolic BP 128 133 127 124 118 120 107  Diastolic BP 84 87 79 75 71 73 70  Wt. (Lbs)  197.08       BMI  36.05 kg/m2            No data to display          Updated lab needed at/ before next visit.

## 2023-01-26 NOTE — Assessment & Plan Note (Signed)
Controlled, no change in medication  

## 2023-01-26 NOTE — Assessment & Plan Note (Signed)
  Patient re-educated about  the importance of commitment to a  minimum of 150 minutes of exercise per week as able.  The importance of healthy food choices with portion control discussed, as well as eating regularly and within a 12 hour window most days. The need to choose "clean , green" food 50 to 75% of the time is discussed, as well as to make water the primary drink and set a goal of 64 ounces water daily.       01/26/2023    8:38 AM 10/17/2022   12:36 PM 10/13/2022    4:15 PM  Weight /BMI  Weight 197 lb 1.3 oz 185 lb 6.5 oz 190 lb 14.7 oz  Height 5\' 2"  (1.575 m) 5\' 2"  (1.575 m) 5\' 2"  (1.575 m)  BMI 36.05 kg/m2 33.91 kg/m2 34.92 kg/m2

## 2023-01-26 NOTE — Progress Notes (Signed)
CARLYNE HRABE     MRN: [id number]      DOB: 1961/02/06  Chief Complaint  Patient presents with   Follow-up    Follow up shoulder surgery in jan    HPI [name] [name] is here for follow up and re-evaluation of chronic medical conditions, medication management and review of any available recent lab and radiology data.  Preventive health is updated, specifically  Cancer screening and Immunization.   Larey Seat on the job in January and injured her left shoulder and had surgery, has been out of work for approximately 6 weeks and is back on light duty.  She is doing well. The PT denies any adverse reactions to current medications since the last visit.  ROS Denies recent fever or chills. Denies sinus pressure, nasal congestion, ear pain or sore throat. Denies chest congestion, productive cough or wheezing. Denies chest pains, palpitations and leg swelling Denies abdominal pain, nausea, vomiting,diarrhea or constipation.   Denies dysuria, frequency, hesitancy or incontinence. Denies  uncontrolled joint pain, swelling and limitation in mobility. Denies headaches, seizures, numbness, or tingling. Denies depression, anxiety or insomnia. Denies skin break down or rash.   PE  BP 128/84   Pulse 82   Ht 5\' 2"  (1.575 m)   Wt 197 lb 1.3 oz (89.4 kg)   SpO2 95%   BMI 36.05 kg/m   Patient alert and oriented and in no cardiopulmonary distress.  HEENT: No facial asymmetry, EOMI,     Neck supple .  Chest: Clear to auscultation bilaterally.  CVS: S1, S2 no murmurs, no S3.Regular rate.  ABD: Soft non tender.   Ext: No edema  MS: Adequate ROM spine, shoulders, hips and knees.  Skin: Intact, no ulcerations or rash noted.  Psych: Good eye contact, normal affect. Memory intact not anxious or depressed appearing.  CNS: CN 2-12 intact, power,  normal throughout.no focal deficits noted.   Assessment & Plan  Essential hypertension Controlled, no change in medication DASH diet and commitment  to daily physical activity for a minimum of 30 minutes discussed and encouraged, as a part of hypertension management. The importance of attaining a healthy weight is also discussed.     01/26/2023    8:54 AM 01/26/2023    8:38 AM 10/17/2022    5:30 PM 10/17/2022    5:00 PM 10/17/2022    4:45 PM 10/17/2022    4:30 PM 10/17/2022    2:10 PM  BP/Weight  Systolic BP 128 133 127 124 118 120 107  Diastolic BP 84 87 79 75 71 73 70  Wt. (Lbs)  197.08       BMI  36.05 kg/m2            Morbid obesity (HCC)  Patient re-educated about  the importance of commitment to a  minimum of 150 minutes of exercise per week as able.  The importance of healthy food choices with portion control discussed, as well as eating regularly and within a 12 hour window most days. The need to choose "clean , green" food 50 to 75% of the time is discussed, as well as to make water the primary drink and set a goal of 64 ounces water daily.       01/26/2023    8:38 AM 10/17/2022   12:36 PM 10/13/2022    4:15 PM  Weight /BMI  Weight 197 lb 1.3 oz 185 lb 6.5 oz 190 lb 14.7 oz  Height 5\' 2"  (1.575 m) 5\' 2"  (1.575  m) 5\' 2"  (1.575 m)  BMI 36.05 kg/m2 33.91 kg/m2 34.92 kg/m2      Prediabetes Patient educated about the importance of limiting  Carbohydrate intake , the need to commit to daily physical activity for a minimum of 30 minutes , and to commit weight loss. The fact that changes in all these areas will reduce or eliminate all together the development of diabetes is stressed.      Latest Ref Rng & Units 10/16/2022   10:00 AM 08/28/2022    9:46 AM 05/15/2022    9:21 AM 02/13/2022   11:01 AM 01/31/2022   12:38 PM  Diabetic Labs  HbA1c 4.8 - 5.6 %  6.1   5.9    Chol 100 - 199 mg/dL  161   096    HDL >04 mg/dL  60   67    Calc LDL 0 - 99 mg/dL  540   981    Triglycerides 0 - 149 mg/dL  90   94    Creatinine 0.44 - 1.00 mg/dL 1.91  4.78  2.95  6.21  0.58       01/26/2023    8:54 AM 01/26/2023    8:38 AM 10/17/2022     5:30 PM 10/17/2022    5:00 PM 10/17/2022    4:45 PM 10/17/2022    4:30 PM 10/17/2022    2:10 PM  BP/Weight  Systolic BP 128 133 127 124 118 120 107  Diastolic BP 84 87 79 75 71 73 70  Wt. (Lbs)  197.08       BMI  36.05 kg/m2            No data to display          Updated lab needed at/ before next visit.   Hyperlipemia Hyperlipidemia:Low fat diet discussed and encouraged.   Lipid Panel  Lab Results  Component Value Date   CHOL 205 (H) 08/28/2022   HDL 60 08/28/2022   LDLCALC 129 (H) 08/28/2022   TRIG 90 08/28/2022   CHOLHDL 3.4 08/28/2022       Allergic rhinitis Controlled, no change in medication

## 2023-01-26 NOTE — Assessment & Plan Note (Signed)
Controlled, no change in medication DASH diet and commitment to daily physical activity for a minimum of 30 minutes discussed and encouraged, as a part of hypertension management. The importance of attaining a healthy weight is also discussed.     01/26/2023    8:54 AM 01/26/2023    8:38 AM 10/17/2022    5:30 PM 10/17/2022    5:00 PM 10/17/2022    4:45 PM 10/17/2022    4:30 PM 10/17/2022    2:10 PM  BP/Weight  Systolic BP 128 133 127 124 118 120 107  Diastolic BP 84 87 79 75 71 73 70  Wt. (Lbs)  197.08       BMI  36.05 kg/m2

## 2023-01-26 NOTE — Patient Instructions (Addendum)
Annual exam in November, call if you need me sooner  HBA1C, chem 7 and EGFr and lipid panel today  Please schedule at checkout mammogram  It is important that you exercise regularly at least 30 minutes 5 times a week. If you develop chest pain, have severe difficulty breathing, or feel very tired, stop exercising immediately and seek medical attention   Think about what you will eat, plan ahead. Choose " clean, green, fresh or frozen" over canned, processed or packaged foods which are more sugary, salty and fatty. 70 to 75% of food eaten should be vegetables and fruit. Three meals at set times with snacks allowed between meals, but they must be fruit or vegetables. Aim to eat over a 12 hour period , example 7 am to 7 pm, and STOP after  your last meal of the day. Drink water,generally about 64 ounces per day, no other drink is as healthy. Fruit juice is best enjoyed in a healthy way, by EATING the fruit. Thanks for choosing [facility], we consider it a privelige to serve you. ]

## 2023-01-26 NOTE — Assessment & Plan Note (Signed)
Hyperlipidemia:Low fat diet discussed and encouraged.   Lipid Panel  Lab Results  Component Value Date   CHOL 205 (H) 08/28/2022   HDL 60 08/28/2022   LDLCALC 129 (H) 08/28/2022   TRIG 90 08/28/2022   CHOLHDL 3.4 08/28/2022

## 2023-01-27 LAB — BMP8+EGFR
BUN/Creatinine Ratio: 21 (ref 12–28)
BUN: 12 mg/dL (ref 8–27)
CO2: 24 mmol/L (ref 20–29)
Calcium: 9.9 mg/dL (ref 8.7–10.3)
Chloride: 102 mmol/L (ref 96–106)
Creatinine, Ser: 0.56 mg/dL — ABNORMAL LOW (ref 0.57–1.00)
Glucose: 82 mg/dL (ref 70–99)
Potassium: 4.3 mmol/L (ref 3.5–5.2)
Sodium: 141 mmol/L (ref 134–144)
eGFR: 104 mL/min/{1.73_m2} (ref >59)

## 2023-01-27 LAB — LIPID PANEL
Chol/HDL Ratio: 3.3 ratio (ref 0.0–4.4)
Cholesterol, Total: 207 mg/dL — ABNORMAL HIGH (ref 100–199)
HDL: 63 mg/dL (ref >39)
LDL Chol Calc (NIH): 126 mg/dL — ABNORMAL HIGH (ref 0–99)
Triglycerides: 102 mg/dL (ref 0–149)
VLDL Cholesterol Cal: 18 mg/dL (ref 5–40)

## 2023-01-27 LAB — HEMOGLOBIN A1C
Est. average glucose Bld gHb Est-mCnc: 126 mg/dL
Hgb A1c MFr Bld: 6 % — ABNORMAL HIGH (ref 4.8–5.6)

## 2023-01-29 ENCOUNTER — Other Ambulatory Visit: Payer: Self-pay

## 2023-03-02 ENCOUNTER — Other Ambulatory Visit (HOSPITAL_COMMUNITY): Payer: Self-pay

## 2023-04-02 ENCOUNTER — Other Ambulatory Visit (HOSPITAL_COMMUNITY): Payer: Self-pay

## 2023-04-30 ENCOUNTER — Other Ambulatory Visit: Payer: Self-pay

## 2023-04-30 ENCOUNTER — Other Ambulatory Visit (HOSPITAL_COMMUNITY): Payer: Self-pay

## 2023-04-30 ENCOUNTER — Other Ambulatory Visit: Payer: Self-pay | Admitting: Family Medicine

## 2023-04-30 DIAGNOSIS — M79671 Pain in right foot: Secondary | ICD-10-CM

## 2023-04-30 DIAGNOSIS — M79675 Pain in left toe(s): Secondary | ICD-10-CM

## 2023-04-30 MED ORDER — IBUPROFEN 800 MG PO TABS
800.0000 mg | ORAL_TABLET | Freq: Three times a day (TID) | ORAL | 1 refills | Status: DC | PRN
Start: 2023-04-30 — End: 2024-05-02
  Filled 2023-04-30: qty 30, 10d supply, fill #0
  Filled 2023-08-03: qty 30, 10d supply, fill #1

## 2023-05-14 ENCOUNTER — Inpatient Hospital Stay: Payer: 59 | Attending: Hematology

## 2023-05-14 DIAGNOSIS — Z853 Personal history of malignant neoplasm of breast: Secondary | ICD-10-CM | POA: Diagnosis not present

## 2023-05-14 DIAGNOSIS — D75839 Thrombocytosis, unspecified: Secondary | ICD-10-CM | POA: Insufficient documentation

## 2023-05-14 LAB — CBC WITH DIFFERENTIAL/PLATELET
Abs Immature Granulocytes: 0 10*3/uL (ref 0.00–0.07)
Basophils Absolute: 0 10*3/uL (ref 0.0–0.1)
Basophils Relative: 1 %
Eosinophils Absolute: 0.1 10*3/uL (ref 0.0–0.5)
Eosinophils Relative: 2 %
HCT: 45.5 % (ref 36.0–46.0)
Hemoglobin: 15 g/dL (ref 12.0–15.0)
Immature Granulocytes: 0 %
Lymphocytes Relative: 48 %
Lymphs Abs: 2.6 10*3/uL (ref 0.7–4.0)
MCH: 30.5 pg (ref 26.0–34.0)
MCHC: 33 g/dL (ref 30.0–36.0)
MCV: 92.5 fL (ref 80.0–100.0)
Monocytes Absolute: 0.7 10*3/uL (ref 0.1–1.0)
Monocytes Relative: 13 %
Neutro Abs: 1.9 10*3/uL (ref 1.7–7.7)
Neutrophils Relative %: 36 %
Platelets: 441 10*3/uL — ABNORMAL HIGH (ref 150–400)
RBC: 4.92 MIL/uL (ref 3.87–5.11)
RDW: 14.3 % (ref 11.5–15.5)
WBC: 5.4 10*3/uL (ref 4.0–10.5)
nRBC: 0 % (ref 0.0–0.2)

## 2023-05-14 LAB — COMPREHENSIVE METABOLIC PANEL
ALT: 17 U/L (ref 0–44)
AST: 20 U/L (ref 15–41)
Albumin: 3.8 g/dL (ref 3.5–5.0)
Alkaline Phosphatase: 66 U/L (ref 38–126)
Anion gap: 11 (ref 5–15)
BUN: 8 mg/dL (ref 8–23)
CO2: 27 mmol/L (ref 22–32)
Calcium: 9.5 mg/dL (ref 8.9–10.3)
Chloride: 99 mmol/L (ref 98–111)
Creatinine, Ser: 0.52 mg/dL (ref 0.44–1.00)
GFR, Estimated: >60 mL/min (ref >60)
Glucose, Bld: 93 mg/dL (ref 70–99)
Potassium: 3.1 mmol/L — ABNORMAL LOW (ref 3.5–5.1)
Sodium: 137 mmol/L (ref 135–145)
Total Bilirubin: 0.5 mg/dL (ref 0.3–1.2)
Total Protein: 7.6 g/dL (ref 6.5–8.1)

## 2023-05-14 LAB — IRON AND TIBC
Iron: 52 ug/dL (ref 28–170)
Saturation Ratios: 14 % (ref 10.4–31.8)
TIBC: 365 ug/dL (ref 250–450)
UIBC: 313 ug/dL

## 2023-05-14 LAB — LACTATE DEHYDROGENASE: LDH: 170 U/L (ref 98–192)

## 2023-05-14 LAB — FERRITIN: Ferritin: 114 ng/mL (ref 11–307)

## 2023-05-21 ENCOUNTER — Ambulatory Visit: Payer: No Typology Code available for payment source | Admitting: Physician Assistant

## 2023-05-22 ENCOUNTER — Other Ambulatory Visit (HOSPITAL_COMMUNITY): Payer: Self-pay

## 2023-05-23 ENCOUNTER — Ambulatory Visit: Payer: Self-pay | Admitting: Physician Assistant

## 2023-05-23 ENCOUNTER — Other Ambulatory Visit (HOSPITAL_COMMUNITY): Payer: Self-pay

## 2023-05-28 NOTE — Progress Notes (Unsigned)
[facility] [address], [place] [address]   CLINIC:  Medical Oncology/Hematology  PCP:  Kerri Perches, MD [address] [place] [address] [phone]   REASON FOR VISIT:  Follow-up for thrombocytosis  CURRENT THERAPY: Surveillance  INTERVAL HISTORY:   [name] [name] 62 y.o. female returns for routine follow-up of thrombocytosis.  She was last seen by Rojelio Brenner PA-C on 05/22/2022.  At today's visit, she  reports feeling well.  Since her last visit, she had left rotator cuff repair on 10/17/2022, but denies any other surgeries, hospitalizations, or change in her baseline health status.  THROMBOCYTOSIS:  She denies any symptoms of erythromelalgia, aquagenic pruritus, or vasomotor symptoms.  No B symptoms such as fever, chills, night sweats, unintentional weight loss.  She does admit to tobacco use (smokes 3 cigarettes/day). She denies any autoimmune or rheumatoid diseases.  No frequent joint pains or rashes.    HISTORY OF (LEFT) BREAST CANCER:  She denies any symptoms of recurrence such as new lumps, bone pain, chest pain, dyspnea, or abdominal pain.  She has no new headaches, seizures, or focal neurologic deficits. No B symptoms such as fever, chills, night sweats, unintentional weight loss. She receives annual mammograms and breast exams via her PCP.   She has 90% energy and 100% appetite. She endorses that she is maintaining a stable weight.  ASSESSMENT & PLAN:  1.  Thrombocytosis: - Mild thrombocytosis (platelets 400-500) since at least 2008 - Myeloproliferative work-up negative for JAK2/exon 12/CALR/MPL. - She smokes 3 cigarettes/day   - She is obese with BMI 36.49 - No connective tissue, autoimmune, or rheumatoid disease.  Labs from August 2023 showed weakly positive rheumatoid factor and ANA positive.  No recurrent joint pains or rashes. - Denies any vasomotor symptoms like erythromelalgia or aquagenic pruritus.   No B  symptoms.   - Most recent labs (05/14/2023): Platelets 441, otherwise normal CBC.  LDH normal. Ferritin 114, iron saturation 14%. - Platelets remain minimally elevated but stable in baseline range. - Suspect reactive thrombocytosis in the setting of tobacco use and obesity.  She may have some other source of occult inflammation, especially in the setting of RF/ANA +, but in the absence of any rheumatoid type symptoms, no indication for rheumatology work-up at this time.     - PLAN: Recommend to continued observation without intervention.  If any significant change in the future, we will consider bone marrow aspiration and biopsy.   - Repeat labs and RTC in 1 year   2.  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  She did not receive antiestrogen therapy (ER/PR negative). - Most recent mammogram (06/21/2022): No mammographic evidence of malignancy in either breast, BI-RADS Category 1, negative   - No symptoms concerning for recurrent breast cancer at this time - Breast exam deferred, as patient receives annual breast exams via PCP   - Labs (05/15/2022) show unremarkable CBC and CMP - PLAN: Continue annual mammograms as ordered by PCP (due September 2024).  Continue annual breast exams with PCP.  PLAN SUMMARY: >> Labs in 1 year = CBC/D, CMP, LDH, ferritin, iron/TIBC >> OFFICE visit in 1 year     REVIEW OF SYSTEMS: No acute complaints.  Review of Systems  Constitutional:  Negative for appetite change, chills, diaphoresis, fatigue, fever and unexpected weight change.  HENT:   Negative for lump/mass and nosebleeds.   Eyes:  Negative for eye problems.  Respiratory:  Negative for cough, hemoptysis and shortness of breath.   Cardiovascular:   Negative for chest pain, leg swelling and palpitations.  Gastrointestinal:  Negative for abdominal pain, blood in stool, constipation, diarrhea, nausea and vomiting.  Genitourinary:  Negative for hematuria.   Skin: Negative.   Neurological:  Negative for dizziness, headaches and light-headedness.  Hematological:  Does not bruise/bleed easily.     PHYSICAL EXAM:  ECOG PERFORMANCE STATUS: 0 - Asymptomatic  Vitals:   05/29/23 0806 05/29/23 0811  BP: (!) 136/94 134/88  Pulse: 77   Resp: 17   Temp: 98.6 F (37 C)   SpO2: 99%    Filed Weights   05/29/23 0806  Weight: 199 lb 8 oz (90.5 kg)   Physical Exam Constitutional:      Appearance: Normal appearance. She is obese.  Cardiovascular:     Heart sounds: Normal heart sounds.  Pulmonary:     Breath sounds: Normal breath sounds.  Neurological:     General: No focal deficit present.     Mental Status: Mental status is at baseline.  Psychiatric:        Behavior: Behavior normal. Behavior is cooperative.     PAST MEDICAL/SURGICAL HISTORY:  Past Medical History:  Diagnosis Date   Adenocarcinoma of breast (HCC)    left    Chronic neck pain 02/10/2016   Heel spur    Hyperlipidemia    Hypertension    Migraines    Obesity    Past Surgical History:  Procedure Laterality Date   BREAST SURGERY     btl  1992   CHOLECYSTECTOMY     COLONOSCOPY  09/15/2011   Procedure: COLONOSCOPY;  Surgeon: Arlyce Harman, MD;  [facility]: [facility];  Service: Endoscopy;  Laterality: N/A;  9:15 AM   COLONOSCOPY N/A 08/16/2018   Procedure: COLONOSCOPY;  Surgeon: West Bali, MD;  Location: [facility];  Service: Endoscopy;  Laterality: N/A;  3:00   left lumpectomy and lymph node disection  2000   POLYPECTOMY  08/16/2018   Procedure: POLYPECTOMY;  Surgeon: West Bali, MD;  Location: [facility];  Service: Endoscopy;;  ascending colon(CSx3), descending colon(CSx1)   r heel surgery for spur and chipped bone  11/09/20099   SHOULDER  ARTHROSCOPY WITH ROTATOR CUFF REPAIR AND SUBACROMIAL DECOMPRESSION Left 10/17/2022   Procedure: SHOULDER ARTHROSCOPY WITH ROTATOR CUFF REPAIR AND SUBACROMIAL DECOMPRESSION;  Surgeon: Jones Broom, MD;  Location: [facility];  Service: Orthopedics;  Laterality: Left;   TUBAL LIGATION      SOCIAL HISTORY:  Social History   Socioeconomic History   Marital status: Married    Spouse name: Not on file   Number of children: Not on file   Years of education: Not on file   Highest education level: Not on file  Occupational History   Not on file  Tobacco Use   Smoking status: Every Day    Current packs/day: 0.25    Average packs/day: 0.3 packs/day for 15.0 years (3.8 ttl pk-yrs)    Types: Cigarettes   Smokeless tobacco: Never   Tobacco comments:    smoking 3 per day  Vaping Use   Vaping status: Never Used  Substance and Sexual Activity   Alcohol use: No    Alcohol/week: 0.0  standard drinks of alcohol   Drug use: No   Sexual activity: Yes    Birth control/protection: Post-menopausal  Other Topics Concern   Not on file  Social History Narrative   Not on file   Social Determinants of Health   Financial Resource Strain: Not on file  Food Insecurity: Not on file  Transportation Needs: Not on file  Physical Activity: Not on file  Stress: Not on file  Social Connections: Not on file  Intimate Partner Violence: Not on file    FAMILY HISTORY:  Family History  Problem Relation Age of Onset   Heart attack Mother    Heart attack Father    Glaucoma Father    Hypertension Father    Diabetes Sister    Anesthesia problems Neg Hx    Hypotension Neg Hx    Malignant hyperthermia Neg Hx    Pseudochol deficiency Neg Hx    Migraines Neg Hx     CURRENT MEDICATIONS:  Outpatient Encounter Medications as of 05/29/2023  Medication Sig Note   amLODipine (NORVASC) 2.5 MG tablet Take 1 tablet (2.5 mg total) by mouth daily.    Biotin 1000 MCG tablet Take 1,000 mcg by mouth  daily at 6 (six) AM.     fluticasone (FLONASE) 50 MCG/ACT nasal spray USE 2 SPRAYS IN EACH NOSTRIL ONCE DAILY 08/25/2022: As needed   hydrochlorothiazide (HYDRODIURIL) 25 MG tablet Take 1 tablet (25 mg total) by mouth daily.    ibuprofen (ADVIL) 800 MG tablet Take 1 tablet (800 mg total) by mouth every 8 (eight) hours as needed.    potassium chloride SA (KLOR-CON M) 20 MEQ tablet Take 1 tablet (20 mEq total) by mouth daily.    Vitamin D, Ergocalciferol, (DRISDOL) 1.25 MG (50000 UNIT) CAPS capsule Take 1 capsule (50,000 Units total) by mouth once a week.    No facility-administered encounter medications on file as of 05/29/2023.    ALLERGIES:  Allergies  Allergen Reactions   Ace Inhibitors Cough    LABORATORY DATA:  I have reviewed the labs as listed.  CBC    Component Value Date/Time   WBC 5.4 05/14/2023 0922   RBC 4.92 05/14/2023 0922   HGB 15.0 05/14/2023 0922   HGB 13.9 08/28/2022 0946   HCT 45.5 05/14/2023 0922   HCT 41.8 08/28/2022 0946   PLT 441 (H) 05/14/2023 0922   PLT 491 (H) 08/28/2022 0946   MCV 92.5 05/14/2023 0922   MCV 92 08/28/2022 0946   MCH 30.5 05/14/2023 0922   MCHC 33.0 05/14/2023 0922   RDW 14.3 05/14/2023 0922   RDW 13.8 08/28/2022 0946   LYMPHSABS 2.6 05/14/2023 0922   LYMPHSABS 2.9 08/28/2022 0946   MONOABS 0.7 05/14/2023 0922   EOSABS 0.1 05/14/2023 0922   EOSABS 0.3 08/28/2022 0946   BASOSABS 0.0 05/14/2023 0922   BASOSABS 0.1 08/28/2022 0946      Latest Ref Rng & Units 05/14/2023    9:22 AM 01/26/2023    9:21 AM 10/16/2022   10:00 AM  CMP  Glucose 70 - 99 mg/dL 93  82  86   BUN 8 - 23 mg/dL 8  12  16    Creatinine 0.44 - 1.00 mg/dL 1.61  0.96  0.45   Sodium 135 - 145 mmol/L 137  141  142   Potassium 3.5 - 5.1 mmol/L 3.1  4.3  4.8   Chloride 98 - 111 mmol/L 99  102  106   CO2 22 - 32 mmol/L 27  24  28   Calcium 8.9 - 10.3 mg/dL 9.5  9.9  9.9   Total Protein 6.5 - 8.1 g/dL 7.6     Total Bilirubin 0.3 - 1.2 mg/dL 0.5     Alkaline Phos 38 -  126 U/L 66     AST 15 - 41 U/L 20     ALT 0 - 44 U/L 17       DIAGNOSTIC IMAGING:  I have independently reviewed the relevant imaging and discussed with the patient.   WRAP UP:  All questions were answered. The patient knows to call the clinic with any problems, questions or concerns.  Medical decision making: Low  Time spent on visit: I spent 15 minutes counseling the patient face to face. The total time spent in the appointment was 22 minutes and more than 50% was on counseling.  Carnella Guadalajara, PA-C  05/29/23 8:26 AM

## 2023-05-29 ENCOUNTER — Inpatient Hospital Stay: Payer: 59 | Attending: Physician Assistant | Admitting: Physician Assistant

## 2023-05-29 VITALS — BP 134/88 | HR 77 | Temp 98.6°F | Resp 17 | Ht 62.0 in | Wt 199.5 lb

## 2023-05-29 DIAGNOSIS — Z853 Personal history of malignant neoplasm of breast: Secondary | ICD-10-CM | POA: Diagnosis not present

## 2023-05-29 DIAGNOSIS — F17210 Nicotine dependence, cigarettes, uncomplicated: Secondary | ICD-10-CM | POA: Insufficient documentation

## 2023-05-29 DIAGNOSIS — D75839 Thrombocytosis, unspecified: Secondary | ICD-10-CM | POA: Insufficient documentation

## 2023-05-29 NOTE — Patient Instructions (Signed)
[facility] at [facility] **VISIT SUMMARY & IMPORTANT INSTRUCTIONS **   You were seen today by Rojelio Brenner PA-C for your follow-up visit.    HIGH PLATELETS Your high platelets are most likely from underlying inflammation. This inflammation may be coming from smoking or obesity. You also had positive lab results for possible autoimmune disease, but since you are not having any symptoms of autoimmune disease, there is nothing that we need to do about this right now.  HISTORY OF BREAST CANCER Continue annual breast exams and mammograms with Dr. Lodema Hong. Do not hesitate to reach out to our office if you have any other questions or concerns prior to your next visit.  LABS: Return in 1 year for repeat labs  FOLLOW-UP APPOINTMENT: Office visit in 1 year, after labs  ** Thank you for trusting me with your healthcare!  I strive to provide all of my patients with quality care at each visit.  If you receive a survey for this visit, I would be so grateful to you for taking the time to provide feedback.  Thank you in advance!  ~ Aiyanah Kalama                   Dr. Doreatha Massed   &   Rojelio Brenner, PA-C   - - - - - - - - - - - - - - - - - -    Thank you for choosing [facility] at [facility] to provide your oncology and hematology care.  To afford each patient quality time with our provider, please arrive at least 15 minutes before your scheduled appointment time.   If you have a lab appointment with the Cancer Center please come in thru the Main Entrance and check in at the main information desk.  You need to re-schedule your appointment should you arrive 10 or more minutes late.  We strive to give you quality time with our providers, and arriving late affects you and other patients whose appointments are after yours.  Also, if you no show three or more times for appointments you may be dismissed from the clinic at the providers discretion.      Again, thank you for choosing [facility].  Our hope is that these requests will decrease the amount of time that you wait before being seen by our physicians.       _____________________________________________________________  Should you have questions after your visit to [facility], please contact our office at [phone] and follow the prompts.  Our office hours are 8:00 a.m. and 4:30 p.m. Monday - Friday.  Please note that voicemails left after 4:00 p.m. may not be returned until the following business day.  We are closed weekends and major holidays.  You do have access to a nurse 24-7, just call the main number to the clinic [phone] and do not press any options, hold on the line and a nurse will answer the phone.    For prescription refill requests, have your pharmacy contact our office and allow 72 hours.

## 2023-06-05 ENCOUNTER — Other Ambulatory Visit (HOSPITAL_COMMUNITY): Payer: Self-pay

## 2023-06-06 ENCOUNTER — Other Ambulatory Visit (HOSPITAL_COMMUNITY): Payer: Self-pay

## 2023-06-21 ENCOUNTER — Other Ambulatory Visit (HOSPITAL_COMMUNITY): Payer: Self-pay

## 2023-06-25 ENCOUNTER — Ambulatory Visit (HOSPITAL_COMMUNITY): Payer: 59

## 2023-07-02 ENCOUNTER — Other Ambulatory Visit (HOSPITAL_COMMUNITY): Payer: Self-pay

## 2023-07-02 ENCOUNTER — Ambulatory Visit (HOSPITAL_COMMUNITY)
Admission: RE | Admit: 2023-07-02 | Discharge: 2023-07-02 | Disposition: A | Discharge status: Home / Self Care | Payer: 59 | Source: Ambulatory Visit | Attending: Family Medicine | Admitting: Family Medicine

## 2023-07-02 DIAGNOSIS — Z1231 Encounter for screening mammogram for malignant neoplasm of breast: Secondary | ICD-10-CM | POA: Diagnosis not present

## 2023-07-20 ENCOUNTER — Other Ambulatory Visit (HOSPITAL_COMMUNITY): Payer: Self-pay

## 2023-07-27 ENCOUNTER — Encounter: Payer: 59 | Admitting: Family Medicine

## 2023-08-03 ENCOUNTER — Other Ambulatory Visit: Payer: Self-pay | Admitting: Family Medicine

## 2023-08-03 ENCOUNTER — Other Ambulatory Visit: Payer: Self-pay

## 2023-08-03 ENCOUNTER — Other Ambulatory Visit (HOSPITAL_COMMUNITY): Payer: Self-pay

## 2023-08-03 DIAGNOSIS — I10 Essential (primary) hypertension: Secondary | ICD-10-CM

## 2023-08-03 MED ORDER — POTASSIUM CHLORIDE CRYS ER 20 MEQ PO TBCR
20.0000 meq | EXTENDED_RELEASE_TABLET | Freq: Every day | ORAL | 1 refills | Status: DC
  Filled 2023-08-03: qty 30, 30d supply, fill #0
  Filled 2023-09-03: qty 30, 30d supply, fill #1
  Filled 2023-10-04: qty 30, 30d supply, fill #2
  Filled 2023-11-05: qty 30, 30d supply, fill #3
  Filled 2023-11-29: qty 30, 30d supply, fill #4
  Filled 2024-01-01: qty 30, 30d supply, fill #5

## 2023-08-03 MED ORDER — HYDROCHLOROTHIAZIDE 25 MG PO TABS
25.0000 mg | ORAL_TABLET | Freq: Every day | ORAL | 1 refills | Status: DC
  Filled 2023-08-03: qty 30, 30d supply, fill #0
  Filled 2023-09-03: qty 30, 30d supply, fill #1
  Filled 2023-10-04: qty 30, 30d supply, fill #2
  Filled 2023-11-05: qty 30, 30d supply, fill #3
  Filled 2023-11-29: qty 30, 30d supply, fill #4
  Filled 2024-01-01: qty 30, 30d supply, fill #5

## 2023-08-03 MED ORDER — AMLODIPINE BESYLATE 2.5 MG PO TABS
2.5000 mg | ORAL_TABLET | Freq: Every day | ORAL | 1 refills | Status: DC
Start: 2023-08-03 — End: 2024-01-28
  Filled 2023-08-03: qty 30, 30d supply, fill #0
  Filled 2023-09-03: qty 30, 30d supply, fill #1
  Filled 2023-10-04: qty 30, 30d supply, fill #2
  Filled 2023-11-05: qty 30, 30d supply, fill #3
  Filled 2023-11-29: qty 30, 30d supply, fill #4
  Filled 2024-01-01: qty 30, 30d supply, fill #5

## 2023-08-20 ENCOUNTER — Other Ambulatory Visit (HOSPITAL_COMMUNITY): Payer: Self-pay

## 2023-08-21 ENCOUNTER — Other Ambulatory Visit: Payer: Self-pay

## 2023-08-24 ENCOUNTER — Other Ambulatory Visit (HOSPITAL_COMMUNITY): Payer: Self-pay

## 2023-08-24 ENCOUNTER — Other Ambulatory Visit: Payer: Self-pay

## 2023-08-31 ENCOUNTER — Other Ambulatory Visit: Payer: Self-pay

## 2023-08-31 ENCOUNTER — Ambulatory Visit (INDEPENDENT_AMBULATORY_CARE_PROVIDER_SITE_OTHER): Payer: 59 | Admitting: Family Medicine

## 2023-08-31 ENCOUNTER — Encounter: Payer: Self-pay | Admitting: Family Medicine

## 2023-08-31 VITALS — BP 150/94 | HR 80 | Resp 16 | Ht 62.0 in | Wt 198.1 lb

## 2023-08-31 DIAGNOSIS — E7849 Other hyperlipidemia: Secondary | ICD-10-CM

## 2023-08-31 DIAGNOSIS — L84 Corns and callosities: Secondary | ICD-10-CM | POA: Insufficient documentation

## 2023-08-31 DIAGNOSIS — F17218 Nicotine dependence, cigarettes, with other nicotine-induced disorders: Secondary | ICD-10-CM | POA: Diagnosis not present

## 2023-08-31 DIAGNOSIS — E559 Vitamin D deficiency, unspecified: Secondary | ICD-10-CM | POA: Diagnosis not present

## 2023-08-31 DIAGNOSIS — B351 Tinea unguium: Secondary | ICD-10-CM | POA: Insufficient documentation

## 2023-08-31 DIAGNOSIS — R7303 Prediabetes: Secondary | ICD-10-CM | POA: Diagnosis not present

## 2023-08-31 DIAGNOSIS — D126 Benign neoplasm of colon, unspecified: Secondary | ICD-10-CM

## 2023-08-31 DIAGNOSIS — Z0001 Encounter for general adult medical examination with abnormal findings: Secondary | ICD-10-CM | POA: Diagnosis not present

## 2023-08-31 DIAGNOSIS — I10 Essential (primary) hypertension: Secondary | ICD-10-CM | POA: Diagnosis not present

## 2023-08-31 MED ORDER — TERBINAFINE HCL 250 MG PO TABS
250.0000 mg | ORAL_TABLET | Freq: Every day | ORAL | 1 refills | Status: DC
  Filled 2023-08-31: qty 30, 30d supply, fill #0
  Filled 2023-10-04: qty 30, 30d supply, fill #1
  Filled 2023-11-05: qty 24, 24d supply, fill #2

## 2023-08-31 NOTE — Assessment & Plan Note (Signed)
Painful, awakens pt  refer Podiatry for definitive management

## 2023-08-31 NOTE — Progress Notes (Signed)
[name] [name]     MRN: [id number]      DOB: 1961-08-18  Chief Complaint  Patient presents with   Annual Exam   Nail Problem    On great toe of left foot and a callus she wants checked     HPI: Patient is in for annual physical exam. other health concerns are expressed and  addressed at the visit.  Immunization is reviewed , and  is up to date  PE: BP (!) 150/94   Pulse 80   Resp 16   Ht 5\' 2"  (1.575 m)   Wt 198 lb 1.9 oz (89.9 kg)   SpO2 95%   BMI 36.24 kg/m   Pleasant  female, alert and oriented x 3, in no cardio-pulmonary distress. Afebrile. HEENT No facial trauma or asymetry. Sinuses non tender.  Extra occullar muscles intact.. External ears normal, . Neck: supple, no adenopathy,JVD or thyromegaly.No bruits.  Chest: Clear to ascultation bilaterally.No crackles or wheezes. Non tender to palpation   Cardiovascular system; Heart sounds normal,  S1 and  S2 ,no S3.  No murmur, or thrill. Apical beat not displaced Peripheral pulses normal.  Abdomen: Soft, non tender, no organomegaly or masses. No bruits. Bowel sounds normal. No guarding, tenderness or rebound.     Musculoskeletal exam: Full ROM of spine, hips , shoulders and knees. No deformity ,swelling or crepitus noted. No muscle wasting or atrophy.   Neurologic: Cranial nerves 2 to 12 intact. Power, tone ,sensation and reflexes normal throughout. No disturbance in gait. No tremor.  Skin: Intact, no ulceration, erythema , scaling or rash noted. Painful callus/ corn on left sole Pigmentation normal throughout Onychomycosis bilateral esp great toe nail on left   Psych; Normal mood and affect. Judgement and concentration normal   Assessment & Plan:  Annual visit for general adult medical examination with abnormal findings Annual exam as documented. Counseling done  re healthy lifestyle involving commitment to 150 minutes exercise per week, heart healthy diet, and attaining healthy  weight.The importance of adequate sleep also discussed.Regular seat belt use and home safety, is also discussed. Changes in health habits are decided on by the patient with goals and time frames  set for achieving them. Immunization and cancer screening needs are specifically addressed at this visit.   Essential hypertension Elevated at visit, p[t resisting med adjustment DASH diet and commitment to daily physical activity for a minimum of 30 minutes discussed and encouraged, as a part of hypertension management. The importance of attaining a healthy weight is also discussed.     08/31/2023    9:55 AM 08/31/2023    9:01 AM 05/29/2023    8:11 AM 05/29/2023    8:06 AM 01/26/2023    8:54 AM 01/26/2023    8:38 AM 10/17/2022    5:30 PM  BP/Weight  Systolic BP 150 126 134 136 128 133 127  Diastolic BP 94 83 88 94 84 87 79  Wt. (Lbs)  198.12  199.5  197.08   BMI  36.24 kg/m2  36.49 kg/m2  36.05 kg/m2      Will check BP at home and may call in sooner than next appt  Nicotine dependence Asked:confirms currently smokes cigarettes, 3/day Assess: Unwilling to set a quit date,  Advise: needs to QUIT to reduce risk of cancer, cardio and cerebrovascular disease Assist: counseled for 5 minutes and literature provided Arrange: follow up in 2 to 4 months   Morbid obesity [facility])  Patient re-educated about  the  importance of commitment to a  minimum of 150 minutes of exercise per week as able.  The importance of healthy food choices with portion control discussed, as well as eating regularly and within a 12 hour window most days. The need to choose "clean , green" food 50 to 75% of the time is discussed, as well as to make water the primary drink and set a goal of 64 ounces water daily.       08/31/2023    9:01 AM 05/29/2023    8:06 AM 01/26/2023    8:38 AM  Weight /BMI  Weight 198 lb 1.9 oz 199 lb 8 oz 197 lb 1.3 oz  Height 5\' 2"  (1.575 m) 5\' 2"  (1.575 m) 5\' 2"  (1.575 m)  BMI 36.24 kg/m2 36.49 kg/m2  36.05 kg/m2    Obesity associated with hypertension   Corns and callus Painful, awakens pt  refer Podiatry for definitive management  Vitamin D deficiency Updated lab needed at/ before next visit.   Tubular adenoma of colon Asymptomatic , will need rept in 2024 to 2026  Onychomycosis Terbinafine x 12 weeks if normal liver function

## 2023-08-31 NOTE — Assessment & Plan Note (Signed)
Terbinafine x 12 weeks if normal liver function

## 2023-08-31 NOTE — Assessment & Plan Note (Signed)
Asked:confirms currently smokes cigarettes, 3/day Assess: Unwilling to set a quit date,  Advise: needs to QUIT to reduce risk of cancer, cardio and cerebrovascular disease Assist: counseled for 5 minutes and literature provided Arrange: follow up in 2 to 4 months

## 2023-08-31 NOTE — Assessment & Plan Note (Signed)

## 2023-08-31 NOTE — Assessment & Plan Note (Signed)
Updated lab needed at/ before next visit.   

## 2023-08-31 NOTE — Assessment & Plan Note (Signed)
Elevated at visit, p[t resisting med adjustment DASH diet and commitment to daily physical activity for a minimum of 30 minutes discussed and encouraged, as a part of hypertension management. The importance of attaining a healthy weight is also discussed.     08/31/2023    9:55 AM 08/31/2023    9:01 AM 05/29/2023    8:11 AM 05/29/2023    8:06 AM 01/26/2023    8:54 AM 01/26/2023    8:38 AM 10/17/2022    5:30 PM  BP/Weight  Systolic BP 150 126 134 136 128 133 127  Diastolic BP 94 83 88 94 84 87 79  Wt. (Lbs)  198.12  199.5  197.08   BMI  36.24 kg/m2  36.49 kg/m2  36.05 kg/m2      Will check BP at home and may call in sooner than next appt

## 2023-08-31 NOTE — Assessment & Plan Note (Signed)
  Patient re-educated about  the importance of commitment to a  minimum of 150 minutes of exercise per week as able.  The importance of healthy food choices with portion control discussed, as well as eating regularly and within a 12 hour window most days. The need to choose "clean , green" food 50 to 75% of the time is discussed, as well as to make water the primary drink and set a goal of 64 ounces water daily.       08/31/2023    9:01 AM 05/29/2023    8:06 AM 01/26/2023    8:38 AM  Weight /BMI  Weight 198 lb 1.9 oz 199 lb 8 oz 197 lb 1.3 oz  Height 5\' 2"  (1.575 m) 5\' 2"  (1.575 m) 5\' 2"  (1.575 m)  BMI 36.24 kg/m2 36.49 kg/m2 36.05 kg/m2    Obesity associated with hypertension

## 2023-08-31 NOTE — Patient Instructions (Addendum)
F/U in 4 months, call if you need me sooner, weight lodss goal of 10  pounds  Please work on stopping smoking  Lipid, cmp and eGFr, hBA1C, TSH and vit D today  I am   concerned about your blood pressure, pklease  check at home at least 4 times per week and record. If consistently over 135, you need to come in for medication adjustment that I am recommending today       You are referred to Podiatry re painful callus  left foot x  6 months  Terbinafine tablet for fungal nail infection is prescribe d to start next week, do not fill if liver test is abnormal , I will also message you   Think about what you will eat, plan ahead. Choose " clean, green, fresh or frozen" over canned, processed or packaged foods which are more sugary, salty and fatty. 70 to 75% of food eaten should be vegetables and fruit. Three meals at set times with snacks allowed between meals, but they must be fruit or vegetables. Aim to eat over a 12 hour period , example 7 am to 7 pm, and STOP after  your last meal of the day. Drink water,generally about 64 ounces per day, no other drink is as healthy. Fruit juice is best enjoyed in a healthy way, by EATING the fruit.  Thanks for choosing [facility], we consider it a privelige to serve you.

## 2023-08-31 NOTE — Assessment & Plan Note (Signed)
Asymptomatic , will need rept in 2024 to 2026

## 2023-09-03 ENCOUNTER — Other Ambulatory Visit (HOSPITAL_COMMUNITY): Payer: Self-pay

## 2023-09-10 ENCOUNTER — Ambulatory Visit: Payer: 59 | Admitting: Podiatry

## 2023-09-10 ENCOUNTER — Encounter: Payer: Self-pay | Admitting: Podiatry

## 2023-09-10 VITALS — Ht 62.0 in | Wt 198.0 lb

## 2023-09-10 DIAGNOSIS — L600 Ingrowing nail: Secondary | ICD-10-CM | POA: Diagnosis not present

## 2023-09-10 DIAGNOSIS — Q828 Other specified congenital malformations of skin: Secondary | ICD-10-CM | POA: Diagnosis not present

## 2023-09-10 NOTE — Progress Notes (Signed)
Subjective:   Patient ID: [name] [name], female   DOB: 62 y.o.   MRN: [id number]   HPI Patient presents with severe thickness of nailbeds 1-5 both feet with the left big toenail being sore especially at night and patient has started taking an oral antifungal over the last month and also has inflammation and pain around the lesions on both feet   ROS      Objective:  Physical Exam  Neurovascular status intact severe elongation thickness with looseness of the left big toenail painful when pressed and chronic lesions bilateral left over right that can be painful     Assessment:  Probability that were dealing with not just mycotic nail infection but ingrown nail with trauma and pain of the bed left over right with patient on oral antifungal eft     Plan:  H&P condition reviewed at great length and I did discuss the possibility for nail removal left of the permanent nature.  At this point organ to try conservative and I did deep debridement of the nailbeds courtesy I debrided the lesions discussed possibility for surgical intervention in future and answered questions concerning this

## 2023-09-14 ENCOUNTER — Other Ambulatory Visit (HOSPITAL_COMMUNITY): Payer: Self-pay

## 2023-09-18 ENCOUNTER — Other Ambulatory Visit: Payer: Self-pay

## 2023-09-24 LAB — LIPID PANEL
Chol/HDL Ratio: 3.8 {ratio} (ref 0.0–4.4)
Cholesterol, Total: 222 mg/dL — ABNORMAL HIGH (ref 100–199)
HDL: 59 mg/dL (ref >39)
LDL Chol Calc (NIH): 148 mg/dL — ABNORMAL HIGH (ref 0–99)
Triglycerides: 87 mg/dL (ref 0–149)
VLDL Cholesterol Cal: 15 mg/dL (ref 5–40)

## 2023-09-24 LAB — CMP14+EGFR
ALT: 13 [IU]/L (ref 0–32)
AST: 14 [IU]/L (ref 0–40)
Albumin: 4.3 g/dL (ref 3.9–4.9)
Alkaline Phosphatase: 88 [IU]/L (ref 44–121)
BUN/Creatinine Ratio: 17 (ref 12–28)
BUN: 11 mg/dL (ref 8–27)
Bilirubin Total: 0.3 mg/dL (ref 0.0–1.2)
CO2: 25 mmol/L (ref 20–29)
Calcium: 10.1 mg/dL (ref 8.7–10.3)
Chloride: 104 mmol/L (ref 96–106)
Creatinine, Ser: 0.64 mg/dL (ref 0.57–1.00)
Globulin, Total: 3 g/dL (ref 1.5–4.5)
Glucose: 84 mg/dL (ref 70–99)
Potassium: 4 mmol/L (ref 3.5–5.2)
Sodium: 142 mmol/L (ref 134–144)
Total Protein: 7.3 g/dL (ref 6.0–8.5)
eGFR: 100 mL/min/{1.73_m2} (ref >59)

## 2023-09-24 LAB — VITAMIN D 1,25 DIHYDROXY
Vitamin D 1, 25 (OH)2 Total: 83 pg/mL — ABNORMAL HIGH
Vitamin D2 1, 25 (OH)2: 70 pg/mL
Vitamin D3 1, 25 (OH)2: 13 pg/mL

## 2023-09-24 LAB — TSH: TSH: 0.879 u[IU]/mL (ref 0.450–4.500)

## 2023-09-24 LAB — HEMOGLOBIN A1C
Est. average glucose Bld gHb Est-mCnc: 126 mg/dL
Hgb A1c MFr Bld: 6 % — ABNORMAL HIGH (ref 4.8–5.6)

## 2023-10-04 ENCOUNTER — Other Ambulatory Visit: Payer: Self-pay

## 2023-10-04 ENCOUNTER — Other Ambulatory Visit (HOSPITAL_COMMUNITY): Payer: Self-pay

## 2023-10-22 ENCOUNTER — Other Ambulatory Visit (HOSPITAL_COMMUNITY): Payer: Self-pay

## 2023-11-05 ENCOUNTER — Other Ambulatory Visit (HOSPITAL_COMMUNITY): Payer: Self-pay

## 2023-11-14 ENCOUNTER — Other Ambulatory Visit (HOSPITAL_COMMUNITY): Payer: Self-pay

## 2023-11-29 ENCOUNTER — Other Ambulatory Visit (HOSPITAL_COMMUNITY): Payer: Self-pay

## 2023-11-29 ENCOUNTER — Other Ambulatory Visit: Payer: Self-pay | Admitting: Family Medicine

## 2023-11-29 MED ORDER — TERBINAFINE HCL 250 MG PO TABS
250.0000 mg | ORAL_TABLET | Freq: Every day | ORAL | 1 refills | Status: DC
  Filled 2023-11-29: qty 42, 42d supply, fill #0
  Filled 2024-01-01: qty 42, 42d supply, fill #1

## 2024-01-01 ENCOUNTER — Other Ambulatory Visit (HOSPITAL_COMMUNITY): Payer: Self-pay

## 2024-01-02 ENCOUNTER — Other Ambulatory Visit (HOSPITAL_COMMUNITY): Payer: Self-pay

## 2024-01-02 ENCOUNTER — Ambulatory Visit (INDEPENDENT_AMBULATORY_CARE_PROVIDER_SITE_OTHER): Payer: 59 | Admitting: Family Medicine

## 2024-01-02 ENCOUNTER — Encounter: Payer: Self-pay | Admitting: Family Medicine

## 2024-01-02 ENCOUNTER — Other Ambulatory Visit (HOSPITAL_COMMUNITY): Payer: Self-pay | Admitting: Family Medicine

## 2024-01-02 VITALS — BP 135/81 | HR 81 | Resp 18 | Ht 62.0 in | Wt 193.6 lb

## 2024-01-02 DIAGNOSIS — Z1231 Encounter for screening mammogram for malignant neoplasm of breast: Secondary | ICD-10-CM

## 2024-01-02 DIAGNOSIS — E7849 Other hyperlipidemia: Secondary | ICD-10-CM

## 2024-01-02 DIAGNOSIS — J302 Other seasonal allergic rhinitis: Secondary | ICD-10-CM | POA: Diagnosis not present

## 2024-01-02 DIAGNOSIS — R7303 Prediabetes: Secondary | ICD-10-CM

## 2024-01-02 DIAGNOSIS — I10 Essential (primary) hypertension: Secondary | ICD-10-CM

## 2024-01-02 DIAGNOSIS — F17218 Nicotine dependence, cigarettes, with other nicotine-induced disorders: Secondary | ICD-10-CM | POA: Diagnosis not present

## 2024-01-02 MED ORDER — PHENTERMINE HCL 15 MG PO CAPS
15.0000 mg | ORAL_CAPSULE | ORAL | 0 refills | Status: AC
  Filled 2024-01-02 – 2024-01-08 (×2): qty 90, 90d supply, fill #0

## 2024-01-02 NOTE — Assessment & Plan Note (Signed)
 Hyperlipidemia:Low fat diet discussed and encouraged.   Lipid Panel  Lab Results  Component Value Date   CHOL 222 (H) 08/31/2023   HDL 59 08/31/2023   LDLCALC 148 (H) 08/31/2023   TRIG 87 08/31/2023   CHOLHDL 3.8 08/31/2023     Updated lab needed at/ before next visit.

## 2024-01-02 NOTE — Assessment & Plan Note (Signed)
 Improved, start phentermine 15 mg daily  Patient re-educated about  the importance of commitment to a  minimum of 150 minutes of exercise per week as able.  The importance of healthy food choices with portion control discussed, as well as eating regularly and within a 12 hour window most days. The need to choose "clean , green" food 50 to 75% of the time is discussed, as well as to make water the primary drink and set a goal of 64 ounces water daily.       01/02/2024    8:07 AM 09/10/2023    9:10 AM 08/31/2023    9:01 AM  Weight /BMI  Weight 193 lb 9.6 oz 198 lb 198 lb 1.9 oz  Height 5\' 2"  (1.575 m) 5\' 2"  (1.575 m) 5\' 2"  (1.575 m)  BMI 35.41 kg/m2 36.21 kg/m2 36.24 kg/m2

## 2024-01-02 NOTE — Patient Instructions (Addendum)
 F/U in 3 months, call if you need me sooner.  New for weight management is phentermine 15 mg 1 daily.  Please get fasting labs ordered 3 to 5 days before next visit.  Mammogram to be scheduled at checkout due in October.  It is important that you exercise regularly at least 30 minutes 5 times a week. If you develop chest pain, have severe difficulty breathing, or feel very tired, stop exercising immediately and seek medical attention   Think about what you will eat, plan ahead. Choose " clean, green, fresh or frozen" over canned, processed or packaged foods which are more sugary, salty and fatty. 70 to 75% of food eaten should be vegetables and fruit. Three meals at set times with snacks allowed between meals, but they must be fruit or vegetables. Aim to eat over a 12 hour period , example 7 am to 7 pm, and STOP after  your last meal of the day. Drink water,generally about 64 ounces per day, no other drink is as healthy. Fruit juice is best enjoyed in a healthy way, by EATING the fruit. Work on quitting smoking please  Thanks for choosing [facility], we consider it a privelige to serve you.

## 2024-01-02 NOTE — Assessment & Plan Note (Signed)
 Patient educated about the importance of limiting  Carbohydrate intake , the need to commit to daily physical activity for a minimum of 30 minutes , and to commit weight loss. The fact that changes in all these areas will reduce or eliminate all together the development of diabetes is stressed.  Updated lab needed at/ before next visit.      Latest Ref Rng & Units 08/31/2023   10:09 AM 05/14/2023    9:22 AM 01/26/2023    9:21 AM 10/16/2022   10:00 AM 08/28/2022    9:46 AM  Diabetic Labs  HbA1c 4.8 - 5.6 % 6.0   6.0   6.1   Chol 100 - 199 mg/dL 811   914   782   HDL >95 mg/dL 59   63   60   Calc LDL 0 - 99 mg/dL 621   308   657   Triglycerides 0 - 149 mg/dL 87   846   90   Creatinine 0.57 - 1.00 mg/dL 9.62  9.52  8.41  3.24  0.54       01/02/2024    8:07 AM 09/10/2023    9:10 AM 08/31/2023    9:55 AM 08/31/2023    9:01 AM 05/29/2023    8:11 AM 05/29/2023    8:06 AM 01/26/2023    8:54 AM  BP/Weight  Systolic BP 135  401 126 134 136 128  Diastolic BP 81  94 83 88 94 84  Wt. (Lbs) 193.6 198  198.12  199.5   BMI 35.41 kg/m2 36.21 kg/m2  36.24 kg/m2  36.49 kg/m2        No data to display          Updated lab needed at/ before next visit.

## 2024-01-02 NOTE — Assessment & Plan Note (Signed)
 Asked:confirms currently smokes cigarettes 3 to 5 per day Assess: Unwilling to set a quit date, no change in habit Advise: needs to QUIT to reduce risk of cancer, cardio and cerebrovascular disease Assist: counseled for 5 minutes and literature provided Arrange: follow up in 2 to 4 months

## 2024-01-02 NOTE — Assessment & Plan Note (Signed)
 Controlled, no change in medication DASH diet and commitment to daily physical activity for a minimum of 30 minutes discussed and encouraged, as a part of hypertension management. The importance of attaining a healthy weight is also discussed.     01/02/2024    8:07 AM 09/10/2023    9:10 AM 08/31/2023    9:55 AM 08/31/2023    9:01 AM 05/29/2023    8:11 AM 05/29/2023    8:06 AM 01/26/2023    8:54 AM  BP/Weight  Systolic BP 135  347 126 134 136 128  Diastolic BP 81  94 83 88 94 84  Wt. (Lbs) 193.6 198  198.12  199.5   BMI 35.41 kg/m2 36.21 kg/m2  36.24 kg/m2  36.49 kg/m2

## 2024-01-03 NOTE — Progress Notes (Signed)
 [name] [name]     MRN: [id number]      DOB: Apr 26, 1961  Chief Complaint  Patient presents with   Hypertension    Follow up     HPI [name] [name] is here for follow up and re-evaluation of chronic medical conditions, medication management and review of any available recent lab and radiology data.  Preventive health is updated, specifically  Cancer screening and Immunization.   Questions or concerns regarding consultations or procedures which the PT has had in the interim are  addressed. The PT denies any adverse reactions to current medications since the last visit.  There are no new concerns.  There are no specific complaints   ROS Denies recent fever or chills. Denies sinus pressure, nasal congestion, ear pain or sore throat. Denies chest congestion, productive cough or wheezing. Denies chest pains, palpitations and leg swelling Denies abdominal pain, nausea, vomiting,diarrhea or constipation.   Denies dysuria, frequency, hesitancy or incontinence. Denies joint pain, swelling and limitation in mobility. Denies headaches, seizures, numbness, or tingling. Denies depression, anxiety or insomnia. Denies skin break down or rash.   PE  BP 135/81   Pulse 81   Resp 18   Ht 5\' 2"  (1.575 m)   Wt 193 lb 9.6 oz (87.8 kg)   SpO2 96%   BMI 35.41 kg/m   Patient alert and oriented and in no cardiopulmonary distress.  HEENT: No facial asymmetry, EOMI,     Neck supple .  Chest: Clear to auscultation bilaterally.  CVS: S1, S2 no murmurs, no S3.Regular rate.  ABD: Soft non tender.   Ext: No edema  MS: Adequate ROM spine, shoulders, hips and knees.  Skin: Intact, no ulcerations or rash noted.  Psych: Good eye contact, normal affect. Memory intact not anxious or depressed appearing.  CNS: CN 2-12 intact, power,  normal throughout.no focal deficits noted.   Assessment & Plan  Essential hypertension Controlled, no change in medication DASH diet and commitment to daily  physical activity for a minimum of 30 minutes discussed and encouraged, as a part of hypertension management. The importance of attaining a healthy weight is also discussed.     01/02/2024    8:07 AM 09/10/2023    9:10 AM 08/31/2023    9:55 AM 08/31/2023    9:01 AM 05/29/2023    8:11 AM 05/29/2023    8:06 AM 01/26/2023    8:54 AM  BP/Weight  Systolic BP 135  381 126 134 136 128  Diastolic BP 81  94 83 88 94 84  Wt. (Lbs) 193.6 198  198.12  199.5   BMI 35.41 kg/m2 36.21 kg/m2  36.24 kg/m2  36.49 kg/m2        Prediabetes Patient educated about the importance of limiting  Carbohydrate intake , the need to commit to daily physical activity for a minimum of 30 minutes , and to commit weight loss. The fact that changes in all these areas will reduce or eliminate all together the development of diabetes is stressed.      Latest Ref Rng & Units 08/31/2023   10:09 AM 05/14/2023    9:22 AM 01/26/2023    9:21 AM 10/16/2022   10:00 AM 08/28/2022    9:46 AM  Diabetic Labs  HbA1c 4.8 - 5.6 % 6.0   6.0   6.1   Chol 100 - 199 mg/dL 017   510   258   HDL >52 mg/dL 59   63   60   Calc  LDL 0 - 99 mg/dL 213   086   578   Triglycerides 0 - 149 mg/dL 87   469   90   Creatinine 0.57 - 1.00 mg/dL 6.29  5.28  4.13  2.44  0.54       01/02/2024    8:07 AM 09/10/2023    9:10 AM 08/31/2023    9:55 AM 08/31/2023    9:01 AM 05/29/2023    8:11 AM 05/29/2023    8:06 AM 01/26/2023    8:54 AM  BP/Weight  Systolic BP 135  010 126 134 136 128  Diastolic BP 81  94 83 88 94 84  Wt. (Lbs) 193.6 198  198.12  199.5   BMI 35.41 kg/m2 36.21 kg/m2  36.24 kg/m2  36.49 kg/m2        No data to display          Updated lab needed at/ before next visit.   Hyperlipemia Hyperlipidemia:Low fat diet discussed and encouraged.   Lipid Panel  Lab Results  Component Value Date   CHOL 222 (H) 08/31/2023   HDL 59 08/31/2023   LDLCALC 148 (H) 08/31/2023   TRIG 87 08/31/2023   CHOLHDL 3.8 08/31/2023     Updated lab needed  at/ before next visit.   Nicotine dependence Asked:confirms currently smokes cigarettes 3 to 5 per day Assess: Unwilling to set a quit date, no change in habit Advise: needs to QUIT to reduce risk of cancer, cardio and cerebrovascular disease Assist: counseled for 5 minutes and literature provided Arrange: follow up in 2 to 4 months   Morbid obesity (HCC) Improved  Patient re-educated about  the importance of commitment to a  minimum of 150 minutes of exercise per week as able.  The importance of healthy food choices with portion control discussed, as well as eating regularly and within a 12 hour window most days. The need to choose "clean , green" food 50 to 75% of the time is discussed, as well as to make water the primary drink and set a goal of 64 ounces water daily.       01/02/2024    8:07 AM 09/10/2023    9:10 AM 08/31/2023    9:01 AM  Weight /BMI  Weight 193 lb 9.6 oz 198 lb 198 lb 1.9 oz  Height 5\' 2"  (1.575 m) 5\' 2"  (1.575 m) 5\' 2"  (1.575 m)  BMI 35.41 kg/m2 36.21 kg/m2 36.24 kg/m2

## 2024-01-03 NOTE — Assessment & Plan Note (Signed)
 Controlled with " as needed " flonase

## 2024-01-08 ENCOUNTER — Other Ambulatory Visit (HOSPITAL_COMMUNITY): Payer: Self-pay

## 2024-01-28 ENCOUNTER — Other Ambulatory Visit (HOSPITAL_COMMUNITY): Payer: Self-pay

## 2024-01-28 ENCOUNTER — Other Ambulatory Visit: Payer: Self-pay | Admitting: Family Medicine

## 2024-01-28 DIAGNOSIS — I10 Essential (primary) hypertension: Secondary | ICD-10-CM

## 2024-01-29 ENCOUNTER — Other Ambulatory Visit (HOSPITAL_COMMUNITY): Payer: Self-pay

## 2024-01-29 MED ORDER — AMLODIPINE BESYLATE 2.5 MG PO TABS
2.5000 mg | ORAL_TABLET | Freq: Every day | ORAL | 1 refills | Status: DC
  Filled 2024-01-29: qty 90, 90d supply, fill #0
  Filled 2024-05-02: qty 90, 90d supply, fill #1

## 2024-01-29 MED ORDER — HYDROCHLOROTHIAZIDE 25 MG PO TABS
25.0000 mg | ORAL_TABLET | Freq: Every day | ORAL | 1 refills | Status: DC
Start: 2024-01-29 — End: 2024-07-29
  Filled 2024-01-29: qty 90, 90d supply, fill #0
  Filled 2024-05-02: qty 90, 90d supply, fill #1

## 2024-01-29 MED ORDER — POTASSIUM CHLORIDE CRYS ER 20 MEQ PO TBCR
20.0000 meq | EXTENDED_RELEASE_TABLET | Freq: Every day | ORAL | 1 refills | Status: DC
Start: 2024-01-29 — End: 2024-07-29
  Filled 2024-01-29: qty 90, 90d supply, fill #0
  Filled 2024-05-02: qty 90, 90d supply, fill #1

## 2024-01-30 ENCOUNTER — Other Ambulatory Visit: Payer: Self-pay

## 2024-01-31 ENCOUNTER — Other Ambulatory Visit (HOSPITAL_COMMUNITY): Payer: Self-pay

## 2024-02-05 ENCOUNTER — Other Ambulatory Visit: Payer: Self-pay | Admitting: Family Medicine

## 2024-02-05 ENCOUNTER — Other Ambulatory Visit (HOSPITAL_COMMUNITY): Payer: Self-pay

## 2024-02-06 ENCOUNTER — Other Ambulatory Visit (HOSPITAL_COMMUNITY): Payer: Self-pay

## 2024-02-11 ENCOUNTER — Other Ambulatory Visit (HOSPITAL_COMMUNITY): Payer: Self-pay

## 2024-02-11 ENCOUNTER — Other Ambulatory Visit: Payer: Self-pay | Admitting: Family Medicine

## 2024-02-20 ENCOUNTER — Other Ambulatory Visit: Payer: Self-pay | Admitting: Family Medicine

## 2024-02-20 ENCOUNTER — Other Ambulatory Visit (HOSPITAL_COMMUNITY): Payer: Self-pay

## 2024-02-20 ENCOUNTER — Other Ambulatory Visit: Payer: Self-pay

## 2024-02-20 MED ORDER — VITAMIN D (ERGOCALCIFEROL) 1.25 MG (50000 UNIT) PO CAPS
50000.0000 [IU] | ORAL_CAPSULE | ORAL | 3 refills | Status: AC
  Filled 2024-02-20: qty 12, 84d supply, fill #0
  Filled 2024-05-02: qty 12, 84d supply, fill #1
  Filled 2024-08-01: qty 12, 84d supply, fill #2
  Filled 2024-10-20: qty 12, 84d supply, fill #3

## 2024-02-20 NOTE — Telephone Encounter (Signed)
 Copied from CRM [id number]. Topic: Clinical - Medication Refill >> Feb 20, 2024 10:32 AM Ivette P wrote: Medication: Vitamin D , Ergocalciferol , (DRISDOL ) 1.25 MG (50000 UNIT) CAPS capsule  Has the patient contacted their pharmacy? Yes (Agent: If no, request that the patient contact the pharmacy for the refill. If patient does not wish to contact the pharmacy document the reason why and proceed with request.) (Agent: If yes, when and what did the pharmacy advise?)  This is the patient's preferred pharmacy:  [facility] [address] [place] [address] Phone: [phone] Fax: [phone]  Is this the correct pharmacy for this prescription? Yes If no, delete pharmacy and type the correct one.   Has the prescription been filled recently? Yes, 01/26/2024  Is the patient out of the medication? Yes  Has the patient been seen for an appointment in the last year OR does the patient have an upcoming appointment? Yes  Can we respond through MyChart? Yes  Agent: Please be advised that Rx refills may take up to 3 business days. We ask that you follow-up with your pharmacy.

## 2024-04-02 ENCOUNTER — Encounter: Payer: Self-pay | Admitting: Family Medicine

## 2024-04-02 ENCOUNTER — Other Ambulatory Visit: Payer: Self-pay

## 2024-04-02 ENCOUNTER — Other Ambulatory Visit (HOSPITAL_COMMUNITY): Payer: Self-pay

## 2024-04-02 ENCOUNTER — Ambulatory Visit: Admitting: Family Medicine

## 2024-04-02 VITALS — BP 136/78 | HR 91 | Resp 16 | Ht 62.0 in | Wt 188.0 lb

## 2024-04-02 DIAGNOSIS — R7303 Prediabetes: Secondary | ICD-10-CM | POA: Diagnosis not present

## 2024-04-02 DIAGNOSIS — E7849 Other hyperlipidemia: Secondary | ICD-10-CM

## 2024-04-02 DIAGNOSIS — F17218 Nicotine dependence, cigarettes, with other nicotine-induced disorders: Secondary | ICD-10-CM

## 2024-04-02 DIAGNOSIS — E559 Vitamin D deficiency, unspecified: Secondary | ICD-10-CM | POA: Diagnosis not present

## 2024-04-02 DIAGNOSIS — I10 Essential (primary) hypertension: Secondary | ICD-10-CM | POA: Diagnosis not present

## 2024-04-02 MED ORDER — PHENTERMINE HCL 15 MG PO CAPS
15.0000 mg | ORAL_CAPSULE | ORAL | 0 refills | Status: DC
  Filled 2024-04-02 – 2024-04-07 (×2): qty 90, 90d supply, fill #0

## 2024-04-02 NOTE — Assessment & Plan Note (Signed)
 Controlled, no change in medication DASH diet and commitment to daily physical activity for a minimum of 30 minutes discussed and encouraged, as a part of hypertension management. The importance of attaining a healthy weight is also discussed.     04/02/2024    8:24 AM 01/02/2024    8:07 AM 09/10/2023    9:10 AM 08/31/2023    9:55 AM 08/31/2023    9:01 AM 05/29/2023    8:11 AM 05/29/2023    8:06 AM  BP/Weight  Systolic BP 136 135  150 126 134 136  Diastolic BP 78 81  94 83 88 94  Wt. (Lbs) 188.04 193.6 198  198.12  199.5  BMI 34.39 kg/m2 35.41 kg/m2 36.21 kg/m2  36.24 kg/m2  36.49 kg/m2

## 2024-04-02 NOTE — Assessment & Plan Note (Signed)
Asked:confirms currently smokes cigarettes Assess: Unwilling to set a quit date, and is not cutting back Advise: needs to QUIT to reduce risk of cancer, cardio and cerebrovascular disease Assist: counseled for 5 minutes and literature provided Arrange: follow up in 2 to 4 months

## 2024-04-02 NOTE — Progress Notes (Signed)
 [name] [name]     MRN: [id number]      DOB: June 13, 1961  Chief Complaint  Patient presents with   Hypertension    Follow up     HPI [name] [name] is here for follow up and re-evaluation of chronic medical conditions, medication management and review of any available recent lab and radiology data.  Preventive health is updated, specifically  Cancer screening and Immunization.   Questions or concerns regarding consultations or procedures which the PT has had in the interim are  addressed.Using topical prep on fungal toenails, did not stay on terbinafine  , though it was causing weight gain The PT denies any adverse reactions to current medications since the last visit.  There are no new concerns.  There are no specific complaints   ROS Denies recent fever or chills. Denies sinus pressure, nasal congestion, ear pain or sore throat. Denies chest congestion, productive cough or wheezing. Denies chest pains, palpitations and leg swelling Denies abdominal pain, nausea, vomiting,diarrhea or constipation.   Denies dysuria, frequency, hesitancy or incontinence. Denies joint pain, swelling and limitation in mobility. Denies headaches, seizures, numbness, or tingling. Denies depression, anxiety or insomnia.    PE  BP 136/78   Pulse 91   Resp 16   Ht 5' 2 (1.575 m)   Wt 188 lb 0.6 oz (85.3 kg)   SpO2 93%   BMI 34.39 kg/m   Patient alert and oriented and in no cardiopulmonary distress.  HEENT: No facial asymmetry, EOMI,     Neck supple .  Chest: Clear to auscultation bilaterally.  CVS: S1, S2 no murmurs, no S3.Regular rate.  ABD: Soft non tender.   Ext: No edema  MS: Adequate ROM spine, shoulders, hips and knees.  Skin: Intact, no ulcerations or rash noted.  Psych: Good eye contact, normal affect. Memory intact not anxious or depressed appearing.  CNS: CN 2-12 intact, power,  normal throughout.no focal deficits noted.   Assessment & Plan  Essential  hypertension Controlled, no change in medication DASH diet and commitment to daily physical activity for a minimum of 30 minutes discussed and encouraged, as a part of hypertension management. The importance of attaining a healthy weight is also discussed.     04/02/2024    8:24 AM 01/02/2024    8:07 AM 09/10/2023    9:10 AM 08/31/2023    9:55 AM 08/31/2023    9:01 AM 05/29/2023    8:11 AM 05/29/2023    8:06 AM  BP/Weight  Systolic BP 136 135  150 126 134 136  Diastolic BP 78 81  94 83 88 94  Wt. (Lbs) 188.04 193.6 198  198.12  199.5  BMI 34.39 kg/m2 35.41 kg/m2 36.21 kg/m2  36.24 kg/m2  36.49 kg/m2       Nicotine dependence Asked:confirms currently smokes cigarettes Assess: Unwilling to set a quit date, and is not  cutting back Advise: needs to QUIT to reduce risk of cancer, cardio and cerebrovascular disease Assist: counseled for 5 minutes and literature provided Arrange: follow up in 2 to 4 months   Morbid obesity (HCC) Improved, continue phentermine  at current dose  Patient re-educated about  the importance of commitment to a  minimum of 150 minutes of exercise per week as able.  The importance of healthy food choices with portion control discussed, as well as eating regularly and within a 12 hour window most days. The need to choose clean , green food 50 to 75% of the time is discussed, as  well as to make water  the primary drink and set a goal of 64 ounces water  daily.       04/02/2024    8:24 AM 01/02/2024    8:07 AM 09/10/2023    9:10 AM  Weight /BMI  Weight 188 lb 0.6 oz 193 lb 9.6 oz 198 lb  Height 5' 2 (1.575 m) 5' 2 (1.575 m) 5' 2 (1.575 m)  BMI 34.39 kg/m2 35.41 kg/m2 36.21 kg/m2      Prediabetes Patient educated about the importance of limiting  Carbohydrate intake , the need to commit to daily physical activity for a minimum of 30 minutes , and to commit weight loss. The fact that changes in all these areas will reduce or eliminate all together the  development of diabetes is stressed.      Latest Ref Rng & Units 08/31/2023   10:09 AM 05/14/2023    9:22 AM 01/26/2023    9:21 AM 10/16/2022   10:00 AM 08/28/2022    9:46 AM  Diabetic Labs  HbA1c 4.8 - 5.6 % 6.0   6.0   6.1   Chol 100 - 199 mg/dL 777   792   794   HDL >60 mg/dL 59   63   60   Calc LDL 0 - 99 mg/dL 851   873   870   Triglycerides 0 - 149 mg/dL 87   897   90   Creatinine 0.57 - 1.00 mg/dL 9.35  9.47  9.43  9.36  0.54       04/02/2024    8:24 AM 01/02/2024    8:07 AM 09/10/2023    9:10 AM 08/31/2023    9:55 AM 08/31/2023    9:01 AM 05/29/2023    8:11 AM 05/29/2023    8:06 AM  BP/Weight  Systolic BP 136 135  150 126 134 136  Diastolic BP 78 81  94 83 88 94  Wt. (Lbs) 188.04 193.6 198  198.12  199.5  BMI 34.39 kg/m2 35.41 kg/m2 36.21 kg/m2  36.24 kg/m2  36.49 kg/m2       No data to display          Updated lab needed .   Hyperlipemia Hyperlipidemia:Low fat diet discussed and encouraged.   Lipid Panel  Lab Results  Component Value Date   CHOL 222 (H) 08/31/2023   HDL 59 08/31/2023   LDLCALC 148 (H) 08/31/2023   TRIG 87 08/31/2023   CHOLHDL 3.8 08/31/2023     Updated lab needed    Vitamin D  deficiency Updated lab needed at/ before next visit.

## 2024-04-02 NOTE — Assessment & Plan Note (Signed)
 Updated lab needed at/ before next visit.

## 2024-04-02 NOTE — Assessment & Plan Note (Signed)
 Improved, continue phentermine  at current dose  Patient re-educated about  the importance of commitment to a  minimum of 150 minutes of exercise per week as able.  The importance of healthy food choices with portion control discussed, as well as eating regularly and within a 12 hour window most days. The need to choose clean , green food 50 to 75% of the time is discussed, as well as to make water  the primary drink and set a goal of 64 ounces water  daily.       04/02/2024    8:24 AM 01/02/2024    8:07 AM 09/10/2023    9:10 AM  Weight /BMI  Weight 188 lb 0.6 oz 193 lb 9.6 oz 198 lb  Height 5' 2 (1.575 m) 5' 2 (1.575 m) 5' 2 (1.575 m)  BMI 34.39 kg/m2 35.41 kg/m2 36.21 kg/m2

## 2024-04-02 NOTE — Assessment & Plan Note (Signed)
 Hyperlipidemia:Low fat diet discussed and encouraged.   Lipid Panel  Lab Results  Component Value Date   CHOL 222 (H) 08/31/2023   HDL 59 08/31/2023   LDLCALC 148 (H) 08/31/2023   TRIG 87 08/31/2023   CHOLHDL 3.8 08/31/2023     Updated lab needed

## 2024-04-02 NOTE — Assessment & Plan Note (Signed)
 Patient educated about the importance of limiting  Carbohydrate intake , the need to commit to daily physical activity for a minimum of 30 minutes , and to commit weight loss. The fact that changes in all these areas will reduce or eliminate all together the development of diabetes is stressed.      Latest Ref Rng & Units 08/31/2023   10:09 AM 05/14/2023    9:22 AM 01/26/2023    9:21 AM 10/16/2022   10:00 AM 08/28/2022    9:46 AM  Diabetic Labs  HbA1c 4.8 - 5.6 % 6.0   6.0   6.1   Chol 100 - 199 mg/dL 777   792   794   HDL >60 mg/dL 59   63   60   Calc LDL 0 - 99 mg/dL 851   873   870   Triglycerides 0 - 149 mg/dL 87   897   90   Creatinine 0.57 - 1.00 mg/dL 9.35  9.47  9.43  9.36  0.54       04/02/2024    8:24 AM 01/02/2024    8:07 AM 09/10/2023    9:10 AM 08/31/2023    9:55 AM 08/31/2023    9:01 AM 05/29/2023    8:11 AM 05/29/2023    8:06 AM  BP/Weight  Systolic BP 136 135  150 126 134 136  Diastolic BP 78 81  94 83 88 94  Wt. (Lbs) 188.04 193.6 198  198.12  199.5  BMI 34.39 kg/m2 35.41 kg/m2 36.21 kg/m2  36.24 kg/m2  36.49 kg/m2       No data to display          Updated lab needed .

## 2024-04-02 NOTE — Patient Instructions (Signed)
 F/U in 4 months, call if you need me sooner  Congrats , keep up 10 pound weight loss  PLEASE decide to and quit smoking those 3 cigarettes each day  It is important that you exercise regularly at least 30 minutes 5 times a week. If you develop chest pain, have severe difficulty breathing, or feel very tired, stop exercising immediately and seek medical attention   Think about what you will eat, plan ahead. Choose  clean, green, fresh or frozen over canned, processed or packaged foods which are more sugary, salty and fatty. 70 to 75% of food eaten should be vegetables and fruit. Three meals at set times with snacks allowed between meals, but they must be fruit or vegetables. Aim to eat over a 12 hour period , example 7 am to 7 pm, and STOP after  your last meal of the day. Drink water ,generally about 64 ounces per day, no other drink is as healthy. Fruit juice is best enjoyed in a healthy way, by EATING the fruit.  Thanks for choosing [facility], we consider it a privelige to serve you.

## 2024-04-03 ENCOUNTER — Ambulatory Visit: Payer: Self-pay | Admitting: Family Medicine

## 2024-04-03 LAB — LIPID PANEL W/O CHOL/HDL RATIO
Cholesterol, Total: 216 mg/dL — ABNORMAL HIGH (ref 100–199)
HDL: 62 mg/dL (ref >39)
LDL Chol Calc (NIH): 141 mg/dL — ABNORMAL HIGH (ref 0–99)
Triglycerides: 76 mg/dL (ref 0–149)
VLDL Cholesterol Cal: 13 mg/dL (ref 5–40)

## 2024-04-03 LAB — BMP8+EGFR
BUN/Creatinine Ratio: 20 (ref 12–28)
BUN: 11 mg/dL (ref 8–27)
CO2: 22 mmol/L (ref 20–29)
Calcium: 10.1 mg/dL (ref 8.7–10.3)
Chloride: 102 mmol/L (ref 96–106)
Creatinine, Ser: 0.56 mg/dL — ABNORMAL LOW (ref 0.57–1.00)
Glucose: 88 mg/dL (ref 70–99)
Potassium: 4.2 mmol/L (ref 3.5–5.2)
Sodium: 142 mmol/L (ref 134–144)
eGFR: 103 mL/min/1.73 (ref >59)

## 2024-04-03 LAB — HEMOGLOBIN A1C
Est. average glucose Bld gHb Est-mCnc: 128 mg/dL
Hgb A1c MFr Bld: 6.1 % — ABNORMAL HIGH (ref 4.8–5.6)

## 2024-04-07 ENCOUNTER — Other Ambulatory Visit (HOSPITAL_COMMUNITY): Payer: Self-pay

## 2024-04-07 ENCOUNTER — Telehealth: Payer: Self-pay

## 2024-04-07 ENCOUNTER — Other Ambulatory Visit: Payer: Self-pay

## 2024-04-07 NOTE — Telephone Encounter (Signed)
 Patient is calling to return NT phone call to confirm the pharmacy medication needs to be sent to. Agent did confirm that medication was sent to [facility] on 04/04/2024. Contacted [facility]ryle Long stated that medication will be in stock tomorrow.

## 2024-04-07 NOTE — Telephone Encounter (Signed)
 Lvm to cb. Needing to verify which pharmacy she wants it sent to

## 2024-04-07 NOTE — Telephone Encounter (Signed)
 Copied from CRM [id number]. Topic: Clinical - Prescription Issue >> Apr 07, 2024  9:35 AM [name] [name] wrote: Reason for CRM: Patient states her medication, phentermine  15 MG capsule, has not been received by her pharmacy. Requesting to have prescription resent

## 2024-04-08 ENCOUNTER — Other Ambulatory Visit: Payer: Self-pay

## 2024-05-02 ENCOUNTER — Other Ambulatory Visit (HOSPITAL_COMMUNITY): Payer: Self-pay

## 2024-05-02 ENCOUNTER — Other Ambulatory Visit: Payer: Self-pay | Admitting: Family Medicine

## 2024-05-02 DIAGNOSIS — M79675 Pain in left toe(s): Secondary | ICD-10-CM

## 2024-05-02 DIAGNOSIS — M79671 Pain in right foot: Secondary | ICD-10-CM

## 2024-05-21 ENCOUNTER — Telehealth: Payer: Self-pay

## 2024-05-21 ENCOUNTER — Other Ambulatory Visit (HOSPITAL_COMMUNITY): Payer: Self-pay

## 2024-05-21 ENCOUNTER — Other Ambulatory Visit: Payer: Self-pay | Admitting: Family Medicine

## 2024-05-21 DIAGNOSIS — M79675 Pain in left toe(s): Secondary | ICD-10-CM

## 2024-05-21 DIAGNOSIS — M79671 Pain in right foot: Secondary | ICD-10-CM

## 2024-05-21 MED ORDER — IBUPROFEN 800 MG PO TABS
800.0000 mg | ORAL_TABLET | Freq: Three times a day (TID) | ORAL | 1 refills | Status: AC | PRN
  Filled 2024-05-21: qty 30, 10d supply, fill #0
  Filled 2024-10-06: qty 30, 10d supply, fill #1

## 2024-05-21 NOTE — Telephone Encounter (Signed)
 Med refill sent

## 2024-05-21 NOTE — Telephone Encounter (Signed)
 Received rx request for Ibuprofen  800mg . Med no longer on active list, not filled in over a year. Please advise

## 2024-06-02 ENCOUNTER — Inpatient Hospital Stay: Payer: 59

## 2024-06-04 ENCOUNTER — Other Ambulatory Visit: Payer: Self-pay

## 2024-06-04 DIAGNOSIS — D75839 Thrombocytosis, unspecified: Secondary | ICD-10-CM

## 2024-06-05 ENCOUNTER — Inpatient Hospital Stay: Attending: Physician Assistant

## 2024-06-05 DIAGNOSIS — Z853 Personal history of malignant neoplasm of breast: Secondary | ICD-10-CM | POA: Diagnosis not present

## 2024-06-05 DIAGNOSIS — D75839 Thrombocytosis, unspecified: Secondary | ICD-10-CM | POA: Diagnosis not present

## 2024-06-05 DIAGNOSIS — Z923 Personal history of irradiation: Secondary | ICD-10-CM | POA: Insufficient documentation

## 2024-06-05 DIAGNOSIS — E669 Obesity, unspecified: Secondary | ICD-10-CM | POA: Insufficient documentation

## 2024-06-05 DIAGNOSIS — Z6835 Body mass index (BMI) 35.0-35.9, adult: Secondary | ICD-10-CM | POA: Diagnosis not present

## 2024-06-05 DIAGNOSIS — F17210 Nicotine dependence, cigarettes, uncomplicated: Secondary | ICD-10-CM | POA: Diagnosis not present

## 2024-06-05 LAB — CBC WITH DIFFERENTIAL/PLATELET
Abs Immature Granulocytes: 0.02 K/uL (ref 0.00–0.07)
Basophils Absolute: 0 K/uL (ref 0.0–0.1)
Basophils Relative: 1 %
Eosinophils Absolute: 0.2 K/uL (ref 0.0–0.5)
Eosinophils Relative: 3 %
HCT: 41 % (ref 36.0–46.0)
Hemoglobin: 13.5 g/dL (ref 12.0–15.0)
Immature Granulocytes: 0 %
Lymphocytes Relative: 38 %
Lymphs Abs: 3 K/uL (ref 0.7–4.0)
MCH: 30.8 pg (ref 26.0–34.0)
MCHC: 32.9 g/dL (ref 30.0–36.0)
MCV: 93.6 fL (ref 80.0–100.0)
Monocytes Absolute: 0.5 K/uL (ref 0.1–1.0)
Monocytes Relative: 7 %
Neutro Abs: 4 K/uL (ref 1.7–7.7)
Neutrophils Relative %: 51 %
Platelets: 469 K/uL — ABNORMAL HIGH (ref 150–400)
RBC: 4.38 MIL/uL (ref 3.87–5.11)
RDW: 14.3 % (ref 11.5–15.5)
WBC: 7.7 K/uL (ref 4.0–10.5)
nRBC: 0 % (ref 0.0–0.2)

## 2024-06-05 LAB — COMPREHENSIVE METABOLIC PANEL WITH GFR
ALT: 14 U/L (ref 0–44)
AST: 15 U/L (ref 15–41)
Albumin: 3.6 g/dL (ref 3.5–5.0)
Alkaline Phosphatase: 65 U/L (ref 38–126)
Anion gap: 12 (ref 5–15)
BUN: 10 mg/dL (ref 8–23)
CO2: 24 mmol/L (ref 22–32)
Calcium: 9.1 mg/dL (ref 8.9–10.3)
Chloride: 104 mmol/L (ref 98–111)
Creatinine, Ser: 0.45 mg/dL (ref 0.44–1.00)
GFR, Estimated: >60 mL/min (ref >60)
Glucose, Bld: 88 mg/dL (ref 70–99)
Potassium: 3.7 mmol/L (ref 3.5–5.1)
Sodium: 140 mmol/L (ref 135–145)
Total Bilirubin: 0.6 mg/dL (ref 0.0–1.2)
Total Protein: 7.1 g/dL (ref 6.5–8.1)

## 2024-06-05 LAB — IRON AND TIBC
Iron: 68 ug/dL (ref 28–170)
Saturation Ratios: 19 % (ref 10.4–31.8)
TIBC: 360 ug/dL (ref 250–450)
UIBC: 292 ug/dL

## 2024-06-05 LAB — FERRITIN: Ferritin: 73 ng/mL (ref 11–307)

## 2024-06-05 LAB — LACTATE DEHYDROGENASE: LDH: 146 U/L (ref 98–192)

## 2024-06-05 LAB — VITAMIN B12: Vitamin B-12: 277 pg/mL (ref 180–914)

## 2024-06-07 NOTE — Progress Notes (Signed)
 [facility] [address], [place] [address]   CLINIC:  Medical Oncology/Hematology  PCP:  [name], MD [address] [place] [address] [phone]   REASON FOR VISIT:  Follow-up for thrombocytosis  CURRENT THERAPY: Surveillance  INTERVAL HISTORY:   [name] [name] 63 y.o. female returns for routine follow-up of thrombocytosis.  She was last seen by Pleasant Barefoot PA-C on 05/29/2023.  At today's visit, she  reports feeling well. She has 100% energy and 100% appetite. She endorses that she is maintaining a stable weight.  THROMBOCYTOSIS:   She denies any symptoms of erythromelalgia, aquagenic pruritus, or vasomotor symptoms. No B symptoms such as fever, chills, night sweats, unintentional weight loss.   She does admit to tobacco use (smokes 3 cigarettes/day).  She denies any autoimmune or rheumatoid diseases.   No frequent joint pains or rashes.    HISTORY OF (LEFT) BREAST CANCER: No new breast lumps or axillary lymphadenopathy. She receives annual mammograms and breast exams via her PCP.   ASSESSMENT & PLAN:  1.  Thrombocytosis: - Mild thrombocytosis (platelets 400-500) since at least 2008 - Myeloproliferative work-up negative for JAK2/exon 12/CALR/MPL. - She smokes 3 cigarettes/day   - She is obese with BMI 35.08 - No connective tissue, autoimmune, or rheumatoid disease.  Labs from August 2023 showed weakly positive rheumatoid factor and ANA positive.  No recurrent joint pains or rashes. - Denies any vasomotor symptoms like erythromelalgia or aquagenic pruritus.   No B symptoms.   - Most recent labs (06/05/2024): Platelets 469, otherwise normal CBC.  LDH normal.  Normal iron levels. - Platelets remain minimally elevated but stable in baseline range. - Suspect reactive thrombocytosis in the setting of tobacco use and obesity.  She may have some other source of occult inflammation, especially in the setting of RF/ANA +, but in  the absence of any rheumatoid type symptoms, no indication for rheumatology work-up at this time.     - PLAN: No indication of MPN.  Platelets mildly elevated but stable within baseline range.  Recommend discharge to PCP for ongoing monitoring.  Can return if any significant elevation above her usual baseline range. - I do note marginal iron and B12 deficiencies.  Recommend she start ferrous sulfate 325 mg every other day and vitamin D  B12 500 mcg every other day.  Labs can be repeated by PCP.     2.  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  She did not receive antiestrogen therapy (ER/PR negative). - Most recent mammogram (06/21/2022): No mammographic evidence of malignancy in either breast, BI-RADS Category 1, negative   - No symptoms concerning for recurrent breast cancer at this time - Breast exam deferred, as patient receives annual breast exams via PCP   - PLAN: Continue annual mammograms as ordered by PCP (due September 2024).  Continue annual breast exams with PCP.  PLAN SUMMARY: >> Discharge to PCP      REVIEW OF SYSTEMS: No acute complaints.   Review of Systems  Constitutional:  Negative for appetite change, chills, diaphoresis, fatigue, fever and unexpected weight change.  HENT:   Negative for lump/mass and nosebleeds.   Eyes:  Negative for eye problems.  Respiratory:  Negative  for cough, hemoptysis and shortness of breath.   Cardiovascular:  Negative for chest pain, leg swelling and palpitations.  Gastrointestinal:  Negative for abdominal pain, blood in stool, constipation, diarrhea, nausea and vomiting.  Genitourinary:  Negative for hematuria.   Skin: Negative.   Neurological:  Negative for dizziness, headaches and  light-headedness.  Hematological:  Does not bruise/bleed easily.     PHYSICAL EXAM:   ECOG PERFORMANCE STATUS: 0 - Asymptomatic  Vitals:   06/09/24 0807  BP: 130/83  Pulse: 93  Resp: 16  Temp: 98 F (36.7 C)  SpO2: 95%    Filed Weights   06/09/24 0807  Weight: 191 lb 12.8 oz (87 kg)    Physical Exam Constitutional:      Appearance: Normal appearance. She is obese.  Cardiovascular:     Heart sounds: Normal heart sounds.  Pulmonary:     Breath sounds: Normal breath sounds.  Neurological:     General: No focal deficit present.     Mental Status: Mental status is at baseline.  Psychiatric:        Behavior: Behavior normal. Behavior is cooperative.     PAST MEDICAL/SURGICAL HISTORY:  Past Medical History:  Diagnosis Date   Adenocarcinoma of breast (HCC)    left    Chronic neck pain 02/10/2016   Heel spur    Hyperlipidemia    Hypertension    Migraines    Obesity    Past Surgical History:  Procedure Laterality Date   BREAST SURGERY     btl  1992   CHOLECYSTECTOMY     COLONOSCOPY  09/15/2011   Procedure: COLONOSCOPY;  Surgeon: Margo CHRISTELLA Haddock, MD;  [facility];  Service: Endoscopy;  Laterality: N/A;  9:15 AM   COLONOSCOPY N/A 08/16/2018   Procedure: COLONOSCOPY;  Surgeon: Haddock Margo CROME, MD;  Location: [facility];  Service: Endoscopy;  Laterality: N/A;  3:00   left lumpectomy and lymph node disection  2000   POLYPECTOMY  08/16/2018   Procedure: POLYPECTOMY;  Surgeon: Haddock Margo CROME, MD;  Location: [facility];  Service: Endoscopy;;  ascending colon(CSx3), descending colon(CSx1)   r heel surgery for spur and chipped bone  11/09/20099   SHOULDER ARTHROSCOPY WITH ROTATOR CUFF REPAIR AND SUBACROMIAL DECOMPRESSION Left 10/17/2022   Procedure: SHOULDER ARTHROSCOPY WITH ROTATOR CUFF REPAIR AND SUBACROMIAL DECOMPRESSION;  Surgeon: Dozier Soulier, MD;  Location: [facility];  Service: Orthopedics;  Laterality: Left;   TUBAL LIGATION       SOCIAL HISTORY:  Social History   Socioeconomic History   Marital status: Married    Spouse name: Not on file   Number of children: Not on file   Years of education: Not on file   Highest education level: Not on file  Occupational History   Not on file  Tobacco Use   Smoking status: Every Day    Current packs/day: 0.25    Average packs/day: 0.3 packs/day for 15.0 years (3.8 ttl pk-yrs)    Types: Cigarettes   Smokeless tobacco: Never   Tobacco comments:    smoking 3 per day  Vaping Use   Vaping status: Never Used  Substance and Sexual Activity   Alcohol use: No    Alcohol/week: 0.0 standard drinks of alcohol   Drug use: No   Sexual activity: Yes    Birth control/protection: Post-menopausal  Other Topics Concern   Not on file  Social History Narrative   Not on file   Social Drivers of Health  Financial Resource Strain: Not on file  Food Insecurity: Not on file  Transportation Needs: Not on file  Physical Activity: Not on file  Stress: Not on file  Social Connections: Not on file  Intimate Partner Violence: Not on file    FAMILY HISTORY:  Family History  Problem Relation Age of Onset   Heart attack Mother    Heart attack Father    Glaucoma Father    Hypertension Father    Diabetes Sister    Anesthesia problems Neg Hx    Hypotension Neg Hx    Malignant hyperthermia Neg Hx    Pseudochol deficiency Neg Hx    Migraines Neg Hx     CURRENT MEDICATIONS:  Outpatient Encounter Medications as of 06/09/2024  Medication Sig Note   amLODipine  (NORVASC ) 2.5 MG tablet Take 1 tablet (2.5 mg total) by mouth daily.    Biotin 1000 MCG tablet Take 1,000 mcg by mouth daily at 6 (six) AM.     fluticasone  (FLONASE ) 50 MCG/ACT nasal spray USE 2 SPRAYS IN EACH NOSTRIL ONCE DAILY 08/25/2022: As needed   hydrochlorothiazide  (HYDRODIURIL ) 25 MG tablet Take 1 tablet (25 mg total) by mouth daily.    ibuprofen  (ADVIL ) 800 MG tablet Take 1 tablet (800 mg total) by mouth every 8  (eight) hours as needed.    phentermine  15 MG capsule Take 1 capsule (15 mg total) by mouth every morning.    phentermine  15 MG capsule Take 1 capsule (15 mg total) by mouth every morning.    potassium chloride  SA (KLOR-CON  M) 20 MEQ tablet Take 1 tablet (20 mEq total) by mouth daily.    terbinafine  (LAMISIL ) 250 MG tablet Take 1 tablet (250 mg total) by mouth daily.    Vitamin D , Ergocalciferol , (DRISDOL ) 1.25 MG (50000 UNIT) CAPS capsule Take 1 capsule (50,000 Units total) by mouth once a week.    No facility-administered encounter medications on file as of 06/09/2024.    ALLERGIES:  Allergies  Allergen Reactions   Ace Inhibitors Cough    LABORATORY DATA:  I have reviewed the labs as listed.  CBC    Component Value Date/Time   WBC 7.7 06/05/2024 0746   RBC 4.38 06/05/2024 0746   HGB 13.5 06/05/2024 0746   HGB 13.9 08/28/2022 0946   HCT 41.0 06/05/2024 0746   HCT 41.8 08/28/2022 0946   PLT 469 (H) 06/05/2024 0746   PLT 491 (H) 08/28/2022 0946   MCV 93.6 06/05/2024 0746   MCV 92 08/28/2022 0946   MCH 30.8 06/05/2024 0746   MCHC 32.9 06/05/2024 0746   RDW 14.3 06/05/2024 0746   RDW 13.8 08/28/2022 0946   LYMPHSABS 3.0 06/05/2024 0746   LYMPHSABS 2.9 08/28/2022 0946   MONOABS 0.5 06/05/2024 0746   EOSABS 0.2 06/05/2024 0746   EOSABS 0.3 08/28/2022 0946   BASOSABS 0.0 06/05/2024 0746   BASOSABS 0.1 08/28/2022 0946      Latest Ref Rng & Units 06/05/2024    7:46 AM 04/02/2024    9:03 AM 08/31/2023   10:09 AM  CMP  Glucose 70 - 99 mg/dL 88  88  84   BUN 8 - 23 mg/dL 10  11  11    Creatinine 0.44 - 1.00 mg/dL 9.54  9.43  9.35   Sodium 135 - 145 mmol/L 140  142  142   Potassium 3.5 - 5.1 mmol/L 3.7  4.2  4.0   Chloride 98 - 111 mmol/L 104  102  104   CO2 22 -  32 mmol/L 24  22  25    Calcium  8.9 - 10.3 mg/dL 9.1  89.8  89.8   Total Protein 6.5 - 8.1 g/dL 7.1   7.3   Total Bilirubin 0.0 - 1.2 mg/dL 0.6   0.3   Alkaline Phos 38 - 126 U/L 65   88   AST 15 - 41 U/L 15   14    ALT 0 - 44 U/L 14   13     DIAGNOSTIC IMAGING:  I have independently reviewed the relevant imaging and discussed with the patient.   WRAP UP:  All questions were answered. The patient knows to call the clinic with any problems, questions or concerns.  Medical decision making: Low   Time spent on visit: I spent 15 minutes counseling the patient face to face. The total time spent in the appointment was 22 minutes and more than 50% was on counseling.  Pleasant CHRISTELLA Barefoot, PA-C  06/09/24 8:59 AM

## 2024-06-09 ENCOUNTER — Inpatient Hospital Stay (HOSPITAL_BASED_OUTPATIENT_CLINIC_OR_DEPARTMENT_OTHER): Payer: 59 | Admitting: Physician Assistant

## 2024-06-09 VITALS — BP 130/83 | HR 93 | Temp 98.0°F | Resp 16 | Wt 191.8 lb

## 2024-06-09 DIAGNOSIS — D75839 Thrombocytosis, unspecified: Secondary | ICD-10-CM | POA: Diagnosis not present

## 2024-06-09 DIAGNOSIS — Z923 Personal history of irradiation: Secondary | ICD-10-CM | POA: Diagnosis not present

## 2024-06-09 LAB — METHYLMALONIC ACID, SERUM: Methylmalonic Acid, Quantitative: 215 nmol/L (ref 0–378)

## 2024-06-09 NOTE — Patient Instructions (Addendum)
 [facility] at [facility] **VISIT SUMMARY & IMPORTANT INSTRUCTIONS **   You were seen today by [name] Barefoot PA-C for your follow-up visit.    HIGH PLATELETS Your high platelets are most likely from underlying inflammation. This inflammation may be coming from smoking or obesity. You do not have any evidence of malignant (cancerous) cause of high platelets. Your primary care doctor can continue to monitor your platelet levels every 6-12 months.  Your should return to see us  again if you have platelets > 600.  HISTORY OF BREAST CANCER Continue annual breast exams and mammograms with Dr. Antonetta. Do not hesitate to reach out to our office if you have any other questions or concerns prior to your next visit.  B12 & IRON You have mildly low Vitamin B12 and iron levels. START taking iron (ferrous sulfate 325 mg) every other day. START taking Vitamin B12 500 mcg every other day.  FOLLOW-UP APPOINTMENT: None.  You can follow-up with your PCP and return to us  as needed.  ** Thank you for trusting me with your healthcare!  I strive to provide all of my patients with quality care at each visit.  If you receive a survey for this visit, I would be so grateful to you for taking the time to provide feedback.  Thank you in advance!  ~ Arwilda [place]                                        [name] [name] [name] Barefoot, PA-C     [name] Hope, NP   - - - - - - - - - - - - - - - - - -    Thank you for choosing [facility] at [facility] to provide your oncology and hematology care.  To afford each patient quality time with our provider, please arrive at least 15 minutes before your scheduled appointment time.   If you have a lab appointment with the Cancer Center please come in thru the Main Entrance and check in at the main information desk.  You need to re-schedule your appointment should you arrive 10 or more minutes late.  We strive to give  you quality time with our providers, and arriving late affects you and other patients whose appointments are after yours.  Also, if you no show three or more times for appointments you may be dismissed from the clinic at the providers discretion.     Again, thank you for choosing [facility].  Our [name] is that these requests will decrease the amount of time that you wait before being seen by our physicians.       _____________________________________________________________  Should you have questions after your visit to [facility], please contact our office at [phone] and follow the prompts.  Our office hours are 8:00 a.m. and 4:30 p.m. Monday - Friday.  Please note that voicemails left after 4:00 p.m. may not be returned until the following business day.  We are closed weekends and major holidays.  You do have access to a nurse 24-7, just call the main number to the clinic [phone] and do not press any options, hold on the line and a nurse will answer the phone.    For prescription refill requests, have your pharmacy contact our office and allow 72 hours.

## 2024-07-07 ENCOUNTER — Ambulatory Visit (HOSPITAL_COMMUNITY)
Admission: RE | Admit: 2024-07-07 | Discharge: 2024-07-07 | Disposition: A | Discharge status: Home / Self Care | Source: Ambulatory Visit | Attending: Family Medicine | Admitting: Family Medicine

## 2024-07-07 DIAGNOSIS — Z1231 Encounter for screening mammogram for malignant neoplasm of breast: Secondary | ICD-10-CM | POA: Diagnosis not present

## 2024-07-11 ENCOUNTER — Ambulatory Visit: Payer: Self-pay

## 2024-07-11 NOTE — Telephone Encounter (Signed)
 Mailed results to pt.

## 2024-07-14 ENCOUNTER — Other Ambulatory Visit: Payer: Self-pay

## 2024-07-14 ENCOUNTER — Other Ambulatory Visit (HOSPITAL_COMMUNITY): Payer: Self-pay

## 2024-07-14 ENCOUNTER — Other Ambulatory Visit: Payer: Self-pay | Admitting: Family Medicine

## 2024-07-15 ENCOUNTER — Other Ambulatory Visit: Payer: Self-pay

## 2024-07-15 MED ORDER — PHENTERMINE HCL 15 MG PO CAPS
15.0000 mg | ORAL_CAPSULE | ORAL | 0 refills | Status: AC
  Filled 2024-07-15: qty 90, 90d supply, fill #0

## 2024-07-29 ENCOUNTER — Other Ambulatory Visit (HOSPITAL_COMMUNITY): Payer: Self-pay

## 2024-07-29 ENCOUNTER — Other Ambulatory Visit: Payer: Self-pay

## 2024-07-29 ENCOUNTER — Telehealth: Payer: Self-pay

## 2024-07-29 ENCOUNTER — Ambulatory Visit: Payer: Self-pay | Admitting: Family Medicine

## 2024-07-29 DIAGNOSIS — I10 Essential (primary) hypertension: Secondary | ICD-10-CM

## 2024-07-29 MED ORDER — FLUTICASONE PROPIONATE 50 MCG/ACT NA SUSP
2.0000 | Freq: Every day | NASAL | 1 refills | Status: AC
  Filled 2024-07-29 (×2): qty 48, 90d supply, fill #0
  Filled 2024-10-24: qty 48, 90d supply, fill #1

## 2024-07-29 MED ORDER — TERBINAFINE HCL 250 MG PO TABS: 250.0000 mg | ORAL_TABLET | Freq: Every day | ORAL | 1 refills | Status: DC

## 2024-07-29 MED ORDER — VITAMIN D (ERGOCALCIFEROL) 1.25 MG (50000 UNIT) PO CAPS: 50000.0000 [IU] | ORAL_CAPSULE | ORAL | 3 refills | Status: AC

## 2024-07-29 MED ORDER — AMLODIPINE BESYLATE 2.5 MG PO TABS: 2.5000 mg | ORAL_TABLET | Freq: Every day | ORAL | 1 refills | Status: DC

## 2024-07-29 MED ORDER — MONTELUKAST SODIUM 10 MG PO TABS
10.0000 mg | ORAL_TABLET | Freq: Every day | ORAL | 1 refills | Status: AC
  Filled 2024-07-29 (×2): qty 90, 90d supply, fill #0
  Filled 2024-10-24: qty 90, 90d supply, fill #1

## 2024-07-29 MED ORDER — POTASSIUM CHLORIDE CRYS ER 20 MEQ PO TBCR
20.0000 meq | EXTENDED_RELEASE_TABLET | Freq: Every day | ORAL | 1 refills | Status: AC
  Filled 2024-07-29: qty 90, 90d supply, fill #0
  Filled 2024-10-27: qty 90, 90d supply, fill #1

## 2024-07-29 MED ORDER — FLUTICASONE PROPIONATE 50 MCG/ACT NA SUSP: 2.0000 | Freq: Every day | NASAL | 1 refills | Status: DC

## 2024-07-29 MED ORDER — HYDROCHLOROTHIAZIDE 25 MG PO TABS
25.0000 mg | ORAL_TABLET | Freq: Every day | ORAL | 1 refills | Status: AC
  Filled 2024-07-29: qty 90, 90d supply, fill #0
  Filled 2024-10-27: qty 90, 90d supply, fill #1

## 2024-07-29 NOTE — Telephone Encounter (Signed)
 Montelukast and flonase  have been prescribed

## 2024-07-29 NOTE — Telephone Encounter (Signed)
 Reason for Disposition . [1] Prescription refill request for NON-ESSENTIAL medicine (i.e., no harm to patient if med not taken) AND [2] triager unable to refill per department policy  Answer Assessment - Initial Assessment Questions Additional info: Patient states she has called in multiple times today to try and request medication refill but they calls were being disconnected. This clinical research associate verified which medication patient is requesting. Offered triage but patient declined as she is not ill.    1. DRUG NAME: What medicine do you need to have refilled?     Flonase , BP meds, supplements, lamisil  2. REFILLS REMAINING: How many refills are remaining? Notes: The label on the medicine or pill bottle will show how many refills are remaining. If there are no refills remaining, then a renewal may be needed.      3. EXPIRATION DATE: What is the expiration date? Note: The label states when the prescription will expire, and thus can no longer be refilled.)      4. PRESCRIBER: Who prescribed it? Note: The prescribing doctor or group is responsible for refill approvals..     pcp 5. PHARMACY: Have you contacted your pharmacy (drugstore)? Note: Some pharmacies will contact the doctor (or NP/PA).      [place] 6. SYMPTOMS: Do you have any symptoms?     Seasonal allergies, nose stuffed up. No new or concerning symptoms.  Protocols used: Medication Refill and Renewal Call-A-AH

## 2024-07-29 NOTE — Telephone Encounter (Addendum)
 FYI Only or Action Required?: Action required by provider: medication refill request.  Patient was last seen in primary care on 04/02/2024 by [name], MD.  Called Nurse Triage reporting Allergies.  Symptoms began n/a.  Interventions attempted: Other: n/a.  Symptoms are: n/a.  Triage Disposition: Call PCP When Office is Open  Patient/caregiver understands and will follow disposition?: Yes   Copied from CRM #[id number]. Topic: Clinical - Medication Question >> Jul 29, 2024  9:11 AM [name] [name] wrote: Reason for CRM: Pt would like provider to send a medication for allergies

## 2024-07-29 NOTE — Telephone Encounter (Signed)
 Copied from CRM #[id number]. Topic: Clinical - Medication Question >> Jul 29, 2024  9:11 AM Olam RAMAN wrote: Reason for CRM: Pt would like provider to send a medication for allergies

## 2024-07-29 NOTE — Addendum Note (Signed)
 Addended by: [name] on: 07/29/2024 04:47 PM   Modules accepted: Orders

## 2024-07-30 ENCOUNTER — Other Ambulatory Visit (HOSPITAL_COMMUNITY): Payer: Self-pay

## 2024-07-31 ENCOUNTER — Other Ambulatory Visit (HOSPITAL_COMMUNITY): Payer: Self-pay

## 2024-08-01 ENCOUNTER — Other Ambulatory Visit: Payer: Self-pay | Admitting: Family Medicine

## 2024-08-01 ENCOUNTER — Other Ambulatory Visit (HOSPITAL_COMMUNITY): Payer: Self-pay

## 2024-08-01 DIAGNOSIS — I10 Essential (primary) hypertension: Secondary | ICD-10-CM

## 2024-08-07 ENCOUNTER — Other Ambulatory Visit: Payer: Self-pay

## 2024-08-07 ENCOUNTER — Encounter (INDEPENDENT_AMBULATORY_CARE_PROVIDER_SITE_OTHER): Payer: Self-pay | Admitting: *Deleted

## 2024-08-07 ENCOUNTER — Ambulatory Visit (INDEPENDENT_AMBULATORY_CARE_PROVIDER_SITE_OTHER): Admitting: Family Medicine

## 2024-08-07 ENCOUNTER — Other Ambulatory Visit (HOSPITAL_COMMUNITY): Payer: Self-pay

## 2024-08-07 ENCOUNTER — Encounter: Payer: Self-pay | Admitting: Family Medicine

## 2024-08-07 VITALS — BP 135/83 | HR 86 | Resp 18 | Ht 62.0 in | Wt 190.0 lb

## 2024-08-07 DIAGNOSIS — E7849 Other hyperlipidemia: Secondary | ICD-10-CM | POA: Diagnosis not present

## 2024-08-07 DIAGNOSIS — I10 Essential (primary) hypertension: Secondary | ICD-10-CM

## 2024-08-07 DIAGNOSIS — E559 Vitamin D deficiency, unspecified: Secondary | ICD-10-CM | POA: Diagnosis not present

## 2024-08-07 DIAGNOSIS — Z0001 Encounter for general adult medical examination with abnormal findings: Secondary | ICD-10-CM

## 2024-08-07 DIAGNOSIS — F17218 Nicotine dependence, cigarettes, with other nicotine-induced disorders: Secondary | ICD-10-CM | POA: Diagnosis not present

## 2024-08-07 DIAGNOSIS — R7303 Prediabetes: Secondary | ICD-10-CM | POA: Diagnosis not present

## 2024-08-07 DIAGNOSIS — D126 Benign neoplasm of colon, unspecified: Secondary | ICD-10-CM | POA: Diagnosis not present

## 2024-08-07 MED ORDER — TERBINAFINE HCL 250 MG PO TABS
250.0000 mg | ORAL_TABLET | Freq: Every day | ORAL | 0 refills | Status: DC
  Filled 2024-08-07: qty 42, 42d supply, fill #0

## 2024-08-07 MED ORDER — AMLODIPINE BESYLATE 2.5 MG PO TABS
2.5000 mg | ORAL_TABLET | Freq: Every day | ORAL | 3 refills | Status: DC
  Filled 2024-08-07: qty 90, 90d supply, fill #0

## 2024-08-07 MED ORDER — AMLODIPINE BESYLATE 2.5 MG PO TABS
2.5000 mg | ORAL_TABLET | Freq: Every day | ORAL | 3 refills | Status: AC
  Filled 2024-08-07: qty 90, 90d supply, fill #0
  Filled 2024-10-27: qty 90, 90d supply, fill #1

## 2024-08-07 NOTE — Assessment & Plan Note (Signed)
 Asked:confirms currently smokes cigarettes 3/day Assess: Unwilling to set a quit date, but is trying to cut back Advise: needs to QUIT to reduce risk of cancer, cardio and cerebrovascular disease Assist: counseled for 5 minutes and literature provided Arrange: follow up in 2 to 4 months

## 2024-08-07 NOTE — Assessment & Plan Note (Signed)

## 2024-08-07 NOTE — Patient Instructions (Addendum)
 F/U in 4 months  Fasting lipid, cmp and EGFr, HBA 1C  No med changes  It is important that you exercise regularly at least 30 minutes 5 times a week. If you develop chest pain, have severe difficulty breathing, or feel very tired, stop exercising immediately and seek medical attention   Think about what you will eat, plan ahead. Choose  clean, green, fresh or frozen over canned, processed or packaged foods which are more sugary, salty and fatty. 70 to 75% of food eaten should be vegetables and fruit. Three meals at set times with snacks allowed between meals, but they must be fruit or vegetables. Aim to eat over a 12 hour period , example 7 am to 7 pm, and STOP after  your last meal of the day. Drink water ,generally about 64 ounces per day, no other drink is as healthy. Fruit juice is best enjoyed in a healthy way, by EATING the fruit.   Thanks for choosing [facility], we consider it a privelige to serve you.

## 2024-08-07 NOTE — Progress Notes (Signed)
    [name] [name]     MRN: [id number]      DOB: Mar 10, 1961  Chief Complaint  Patient presents with   Annual Exam    HPI: Patient is in for annual physical exam. No other health concerns are expressed or addressed at the visit. Recent labs,  are reviewed. Immunization is reviewed , and  updated if needed.   PE: BP 135/83   Pulse 86   Resp 18   Ht 5' 2 (1.575 m)   Wt 190 lb (86.2 kg)   SpO2 94%   BMI 34.75 kg/m   Pleasant  female, alert and oriented x 3, in no cardio-pulmonary distress. Afebrile. HEENT No facial trauma or asymetry. Sinuses non tender.  Extra occullar muscles intact.. External ears normal, . Neck: supple, no adenopathy,JVD or thyromegaly.No bruits.  Chest: Clear to ascultation bilaterally.No crackles or wheezes. Non tender to palpation  Cardiovascular system; Heart sounds normal,  S1 and  S2 ,no S3.  No murmur, or thrill. Apical beat not displaced Peripheral pulses normal.  Abdomen: Soft, non tender, no organomegaly or masses. No bruits. Bowel sounds normal. No guarding, tenderness or rebound.   Musculoskeletal exam: Full ROM of spine, hips , shoulders and knees. No deformity ,swelling or crepitus noted. No muscle wasting or atrophy.   Neurologic: Cranial nerves 2 to 12 intact. Power, tone ,sensation and reflexes normal throughout. No disturbance in gait. No tremor.  Skin: Intact, no ulceration, erythema , scaling or rash noted. Pigmentation normal throughout  Psych; Normal mood and affect. Judgement and concentration normal   Assessment & Plan:  Annual visit for general adult medical examination with abnormal findings Annual exam as documented. Counseling done  re healthy lifestyle involving commitment to 150 minutes exercise per week, heart healthy diet, and attaining healthy weight.The importance of adequate sleep also discussed. Regular seat belt use and home safety, is also discussed. Changes in health habits are decided on  by the patient with goals and time frames  set for achieving them. Immunization and cancer screening needs are specifically addressed at this visit.   Nicotine dependence Asked:confirms currently smokes cigarettes 3/day Assess: Unwilling to set a quit date, but is trying to cut back Advise: needs to QUIT to reduce risk of cancer, cardio and cerebrovascular disease Assist: counseled for 5 minutes and literature provided Arrange: follow up in 2 to 4 months

## 2024-08-22 ENCOUNTER — Other Ambulatory Visit (HOSPITAL_COMMUNITY): Payer: Self-pay

## 2024-10-06 ENCOUNTER — Other Ambulatory Visit (HOSPITAL_COMMUNITY): Payer: Self-pay

## 2024-10-06 ENCOUNTER — Other Ambulatory Visit: Payer: Self-pay | Admitting: Family Medicine

## 2024-10-06 ENCOUNTER — Other Ambulatory Visit: Payer: Self-pay

## 2024-10-06 MED ORDER — TERBINAFINE HCL 250 MG PO TABS
250.0000 mg | ORAL_TABLET | Freq: Every day | ORAL | 0 refills | Status: AC
  Filled 2024-10-06: qty 42, 42d supply, fill #0

## 2024-10-13 ENCOUNTER — Other Ambulatory Visit (HOSPITAL_COMMUNITY): Payer: Self-pay

## 2024-10-13 ENCOUNTER — Other Ambulatory Visit: Payer: Self-pay | Admitting: Internal Medicine

## 2024-10-24 ENCOUNTER — Other Ambulatory Visit: Payer: Self-pay | Admitting: Family Medicine

## 2024-10-24 ENCOUNTER — Other Ambulatory Visit: Payer: Self-pay

## 2024-10-27 ENCOUNTER — Other Ambulatory Visit (HOSPITAL_COMMUNITY): Payer: Self-pay

## 2024-10-27 ENCOUNTER — Other Ambulatory Visit: Payer: Self-pay | Admitting: Family Medicine

## 2024-12-02 ENCOUNTER — Ambulatory Visit: Admitting: Family Medicine
